# Patient Record
Sex: Male | Born: 1935 | Race: White | Hispanic: No | Marital: Married | Smoking: Never smoker
Health system: Southern US, Community
[De-identification: ages and names within clinical notes are randomized; demographics above are authoritative.]

## PROBLEM LIST (undated history)

## (undated) DIAGNOSIS — H3530 Unspecified macular degeneration: Secondary | ICD-10-CM

## (undated) DIAGNOSIS — Z8739 Personal history of other diseases of the musculoskeletal system and connective tissue: Secondary | ICD-10-CM

## (undated) DIAGNOSIS — E119 Type 2 diabetes mellitus without complications: Secondary | ICD-10-CM

## (undated) DIAGNOSIS — E785 Hyperlipidemia, unspecified: Secondary | ICD-10-CM

## (undated) DIAGNOSIS — I10 Essential (primary) hypertension: Secondary | ICD-10-CM

## (undated) DIAGNOSIS — H353210 Exudative age-related macular degeneration, right eye, stage unspecified: Secondary | ICD-10-CM

## (undated) DIAGNOSIS — I255 Ischemic cardiomyopathy: Secondary | ICD-10-CM

## (undated) DIAGNOSIS — N200 Calculus of kidney: Secondary | ICD-10-CM

## (undated) DIAGNOSIS — I214 Non-ST elevation (NSTEMI) myocardial infarction: Secondary | ICD-10-CM

## (undated) DIAGNOSIS — I639 Cerebral infarction, unspecified: Secondary | ICD-10-CM

## (undated) DIAGNOSIS — I2510 Atherosclerotic heart disease of native coronary artery without angina pectoris: Secondary | ICD-10-CM

## (undated) DIAGNOSIS — I8390 Asymptomatic varicose veins of unspecified lower extremity: Secondary | ICD-10-CM

## (undated) DIAGNOSIS — Z95810 Presence of automatic (implantable) cardiac defibrillator: Secondary | ICD-10-CM

## (undated) DIAGNOSIS — I5022 Chronic systolic (congestive) heart failure: Secondary | ICD-10-CM

## (undated) HISTORY — DX: Hyperlipidemia, unspecified: E78.5

## (undated) HISTORY — DX: Essential (primary) hypertension: I10

## (undated) HISTORY — DX: Asymptomatic varicose veins of unspecified lower extremity: I83.90

## (undated) HISTORY — PX: TONSILLECTOMY: SUR1361

## (undated) HISTORY — DX: Non-ST elevation (NSTEMI) myocardial infarction: I21.4

## (undated) HISTORY — PX: CYSTOSCOPY W/ LITHOLAPAXY / EHL: SUR377

## (undated) HISTORY — PX: CARDIAC CATHETERIZATION: SHX172

## (undated) HISTORY — PX: CATARACT EXTRACTION W/ INTRAOCULAR LENS  IMPLANT, BILATERAL: SHX1307

## (undated) HISTORY — DX: Cerebral infarction, unspecified: I63.9

## (undated) HISTORY — PX: CORONARY ANGIOPLASTY: SHX604

## (undated) HISTORY — DX: Atherosclerotic heart disease of native coronary artery without angina pectoris: I25.10

## (undated) HISTORY — DX: Type 2 diabetes mellitus without complications: E11.9

## (undated) HISTORY — DX: Unspecified macular degeneration: H35.30

---

## 2003-03-23 HISTORY — PX: CORONARY ARTERY BYPASS GRAFT: SHX141

## 2003-08-27 ENCOUNTER — Inpatient Hospital Stay (HOSPITAL_COMMUNITY): Admission: EM | Admit: 2003-08-27 | Discharge: 2003-09-04 | Payer: Self-pay | Admitting: Internal Medicine

## 2003-08-27 ENCOUNTER — Encounter: Payer: Self-pay | Admitting: Cardiology

## 2003-08-30 ENCOUNTER — Encounter: Payer: Self-pay | Admitting: Cardiology

## 2003-09-04 ENCOUNTER — Encounter: Payer: Self-pay | Admitting: Cardiology

## 2003-09-20 ENCOUNTER — Encounter: Admission: RE | Admit: 2003-09-20 | Discharge: 2003-09-20 | Payer: Self-pay | Admitting: Cardiothoracic Surgery

## 2003-11-19 ENCOUNTER — Encounter: Payer: Self-pay | Admitting: Cardiology

## 2004-02-12 ENCOUNTER — Ambulatory Visit: Payer: Self-pay | Admitting: Cardiology

## 2004-04-18 ENCOUNTER — Encounter: Payer: Self-pay | Admitting: Physician Assistant

## 2004-04-18 ENCOUNTER — Inpatient Hospital Stay (HOSPITAL_COMMUNITY): Admission: AD | Admit: 2004-04-18 | Discharge: 2004-04-21 | Payer: Self-pay | Admitting: Neurology

## 2004-04-20 ENCOUNTER — Ambulatory Visit: Payer: Self-pay | Admitting: Cardiology

## 2004-04-20 ENCOUNTER — Encounter: Payer: Self-pay | Admitting: Cardiology

## 2004-04-21 ENCOUNTER — Encounter: Payer: Self-pay | Admitting: Cardiology

## 2007-08-17 LAB — CONVERTED CEMR LAB: Hgb A1c MFr Bld: 7.4 %

## 2007-09-11 ENCOUNTER — Ambulatory Visit: Payer: Self-pay | Admitting: Internal Medicine

## 2007-09-11 DIAGNOSIS — E785 Hyperlipidemia, unspecified: Secondary | ICD-10-CM | POA: Insufficient documentation

## 2007-09-11 DIAGNOSIS — I10 Essential (primary) hypertension: Secondary | ICD-10-CM | POA: Insufficient documentation

## 2007-09-11 DIAGNOSIS — I2510 Atherosclerotic heart disease of native coronary artery without angina pectoris: Secondary | ICD-10-CM | POA: Insufficient documentation

## 2007-09-11 DIAGNOSIS — Z8679 Personal history of other diseases of the circulatory system: Secondary | ICD-10-CM | POA: Insufficient documentation

## 2007-09-11 DIAGNOSIS — E1165 Type 2 diabetes mellitus with hyperglycemia: Secondary | ICD-10-CM

## 2007-09-11 DIAGNOSIS — IMO0001 Reserved for inherently not codable concepts without codable children: Secondary | ICD-10-CM | POA: Insufficient documentation

## 2007-09-12 ENCOUNTER — Encounter (INDEPENDENT_AMBULATORY_CARE_PROVIDER_SITE_OTHER): Payer: Self-pay | Admitting: Internal Medicine

## 2007-09-13 LAB — CONVERTED CEMR LAB
ALT: 19 units/L (ref 0–53)
AST: 16 units/L (ref 0–37)
Albumin: 4.7 g/dL (ref 3.5–5.2)
Alkaline Phosphatase: 77 units/L (ref 39–117)
BUN: 20 mg/dL (ref 6–23)
Basophils Absolute: 0 10*3/uL (ref 0.0–0.1)
Basophils Relative: 0 % (ref 0–1)
CO2: 26 meq/L (ref 19–32)
Calcium: 9.4 mg/dL (ref 8.4–10.5)
Chloride: 109 meq/L (ref 96–112)
Cholesterol: 268 mg/dL — ABNORMAL HIGH (ref 0–200)
Creatinine, Ser: 1.07 mg/dL (ref 0.40–1.50)
Eosinophils Absolute: 0 10*3/uL (ref 0.0–0.7)
Eosinophils Relative: 0 % (ref 0–5)
Glucose, Bld: 164 mg/dL — ABNORMAL HIGH (ref 70–99)
HCT: 42.7 % (ref 39.0–52.0)
HDL: 42 mg/dL (ref >39)
Hemoglobin: 14.4 g/dL (ref 13.0–17.0)
LDL Cholesterol: 190 mg/dL — ABNORMAL HIGH (ref 0–99)
Lymphocytes Relative: 15 % (ref 12–46)
Lymphs Abs: 1.1 10*3/uL (ref 0.7–4.0)
MCHC: 33.7 g/dL (ref 30.0–36.0)
MCV: 91.6 fL (ref 78.0–100.0)
Monocytes Absolute: 0.5 10*3/uL (ref 0.1–1.0)
Monocytes Relative: 7 % (ref 3–12)
Neutro Abs: 5.5 10*3/uL (ref 1.7–7.7)
Neutrophils Relative %: 77 % (ref 43–77)
Platelets: 219 10*3/uL (ref 150–400)
Potassium: 4.1 meq/L (ref 3.5–5.3)
RBC: 4.66 M/uL (ref 4.22–5.81)
RDW: 13.7 % (ref 11.5–15.5)
Sodium: 147 meq/L — ABNORMAL HIGH (ref 135–145)
Total Bilirubin: 0.5 mg/dL (ref 0.3–1.2)
Total CHOL/HDL Ratio: 6.4
Total Protein: 6.9 g/dL (ref 6.0–8.3)
Triglycerides: 179 mg/dL — ABNORMAL HIGH (ref <150)
VLDL: 36 mg/dL (ref 0–40)
WBC: 7.1 10*3/uL (ref 4.0–10.5)

## 2007-10-04 ENCOUNTER — Encounter (INDEPENDENT_AMBULATORY_CARE_PROVIDER_SITE_OTHER): Payer: Self-pay | Admitting: Internal Medicine

## 2008-04-24 ENCOUNTER — Encounter (INDEPENDENT_AMBULATORY_CARE_PROVIDER_SITE_OTHER): Payer: Self-pay | Admitting: Internal Medicine

## 2008-08-01 ENCOUNTER — Encounter (INDEPENDENT_AMBULATORY_CARE_PROVIDER_SITE_OTHER): Payer: Self-pay | Admitting: Internal Medicine

## 2009-11-19 ENCOUNTER — Encounter: Payer: Self-pay | Admitting: Cardiology

## 2009-11-27 ENCOUNTER — Encounter: Payer: Self-pay | Admitting: Cardiology

## 2009-11-28 ENCOUNTER — Encounter: Payer: Self-pay | Admitting: Cardiology

## 2009-12-15 ENCOUNTER — Ambulatory Visit: Payer: Self-pay | Admitting: Cardiology

## 2009-12-15 ENCOUNTER — Encounter: Payer: Self-pay | Admitting: Physician Assistant

## 2009-12-15 DIAGNOSIS — I4510 Unspecified right bundle-branch block: Secondary | ICD-10-CM | POA: Insufficient documentation

## 2009-12-16 ENCOUNTER — Encounter: Payer: Self-pay | Admitting: Physician Assistant

## 2009-12-23 ENCOUNTER — Encounter (INDEPENDENT_AMBULATORY_CARE_PROVIDER_SITE_OTHER): Payer: Self-pay | Admitting: *Deleted

## 2009-12-23 ENCOUNTER — Ambulatory Visit (HOSPITAL_COMMUNITY): Admission: RE | Admit: 2009-12-23 | Discharge: 2009-12-23 | Payer: Self-pay | Admitting: Cardiology

## 2009-12-23 ENCOUNTER — Encounter: Payer: Self-pay | Admitting: Cardiology

## 2009-12-23 ENCOUNTER — Ambulatory Visit: Payer: Self-pay | Admitting: Cardiology

## 2009-12-23 ENCOUNTER — Ambulatory Visit: Payer: Self-pay

## 2009-12-24 ENCOUNTER — Encounter (INDEPENDENT_AMBULATORY_CARE_PROVIDER_SITE_OTHER): Payer: Self-pay | Admitting: *Deleted

## 2009-12-29 ENCOUNTER — Ambulatory Visit: Payer: Self-pay | Admitting: Cardiology

## 2009-12-29 ENCOUNTER — Encounter: Payer: Self-pay | Admitting: Cardiology

## 2010-01-01 ENCOUNTER — Telehealth (INDEPENDENT_AMBULATORY_CARE_PROVIDER_SITE_OTHER): Payer: Self-pay | Admitting: *Deleted

## 2010-01-12 ENCOUNTER — Ambulatory Visit: Payer: Self-pay | Admitting: Cardiology

## 2010-01-12 DIAGNOSIS — I255 Ischemic cardiomyopathy: Secondary | ICD-10-CM | POA: Insufficient documentation

## 2010-03-19 ENCOUNTER — Encounter: Payer: Self-pay | Admitting: Physician Assistant

## 2010-03-24 ENCOUNTER — Encounter: Payer: Self-pay | Admitting: Physician Assistant

## 2010-03-30 ENCOUNTER — Ambulatory Visit
Admission: RE | Admit: 2010-03-30 | Discharge: 2010-03-30 | Payer: Self-pay | Source: Home / Self Care | Attending: Cardiology | Admitting: Cardiology

## 2010-04-21 NOTE — Progress Notes (Signed)
Summary: PHONE: TEST RESULTS  Phone Note Call from Patient Call back at Home Phone [phone]   Caller: wife-JOY Reason for Call: Lab or Test Results Summary of Call: Had voice mail message from [name]. She would really like to receive a telephone call about test results for her husband Clayton Morton.  Initial call taken by: Clayton Morton,  January 01, 2010 4:04 PM  Follow-up for Phone Call        Spoke with pt. He states they just wanted to let us know that when he went for Echo they were not going to do definity until he reminded them to do it. He is aware results of stress test will be d/w him at office visit on 10/24. Follow-up by: Gurney Maxin, RN, BSN,  January 01, 2010 4:11 PM

## 2010-04-21 NOTE — Miscellaneous (Signed)
Summary: IV for definity echo  Clinical Lists Changes IV started in R wrist with a 20g insyte for definity with echo via [facility]

## 2010-04-21 NOTE — Assessment & Plan Note (Signed)
Summary: EST-LAST SEEN 2005   CARDIOMYAPATHY   Visit Type:  Initial Consult Primary Terri Rorrer:  Dr. Jerene Bears   History of Present Illness: 77 year old male referred for cardiology consultation. He is referred to discuss an abnormal echocardiogram, ordered by Dr. Woody Seller. This study is reviewed below (read by [employer]).  Patient explains that he underwent recent extensive workup, per Dr. Woody Seller, for evaluation of persistently elevated blood pressure. His workup included a CT scan of the head and carotid Dopplers, both reportedly negative. He states that he had to undergo redo surgery of his foot, and remained bed ridden for a few months, during which time he gained weight and noted a rise in his blood pressure. He also developed some associated headaches.  He has since been placed on an additional antihypertensive, and reports improved blood pressure readings, down from the initial [id number] systolic range, to current readings in the 130-140 range.  Clinically, he denies any interim development of exertional angina pectoris or dyspnea, nor orthopnea, PND, or significant lower extremity edema. He also denies any tachycardia palpitations.  Preventive Screening-Counseling & Management  Alcohol-Tobacco     Smoking Status: never  Current Medications (verified): 1)  Metformin Hcl 500 Mg  Tabs (Metformin Hcl) .... Take 1 Tablet By Mouth Once A Day 2)  Altace 5 Mg  Caps (Ramipril) .... Take 1 Tablet By Mouth Two Times A Day 3)  L-Carnitine 500 Mg  Tabs (Levocarnitine) .... Take 2 Tablet By Mouth Once A Day 4)  Aspirin 81 Mg  Tbec (Aspirin) .[name].. 1 By Mouth Once Daily 5)  Icaps Areds Formula  Tabs (Multiple Vitamins-Minerals) .... Take 1 Tablet By Mouth Once A Day 6)  Azor 5-40 Mg Tabs (Amlodipine-Olmesartan) .... Take 1 Tablet By Mouth Once A Day 7)  Allopurinol 300 Mg Tabs (Allopurinol) .... Take 1 Tablet By Mouth Once A Day 8)  Neuro-Plus  Caps (Nutritional Supplements) .... Take 2 Tablet By  Mouth Once A Day 9)  Lutein 40 Mg Caps (Lutein) .... Take 1 Tablet By Mouth Once A Day  Allergies (verified): 1)  ! Tetracycline  Comments:  Nurse/Medical Assistant: The patient's medication list and allergies were reviewed with the patient and were updated in the Medication and Allergy Lists.  Past History:  Family History: Last updated: 12/15/2009 Noncontributory  Social History: Last updated: 12/15/2009 Married Never Smoked Alcohol use-no Drug use-no  Past Medical History: Hypertension Cerebrovascular accident - right subcortical infarct 2006 Cataracts Macular degeneration - Dr. [name] CAD - multivessel Hyperlipidemia Varicose veins Diabetes Type 2 Myocardial Infarction - NSTEMI 2005  Past Surgical History: CABG 2005, Dr. Prescott Gum - LIMA to LAD, SVG to diagonal, SVG to PDA and PLA  Family History: Noncontributory  Social History: Married Never Smoked Alcohol use-no Drug use-no  Review of Systems       No fevers, chills, hemoptysis, dysphagia, melena, hematocheezia, hematuria, rash, claudication, orthopnea, pnd, pedal edema. All other systems negative.   Vital Signs:  Patient profile:   77 year old male Height:      65.25 inches Weight:      184 pounds BMI:     30.49 Pulse rate:   63 / minute BP sitting:   162 / 79  (left arm) Cuff size:   regular  Vitals Entered By: Georgina Peer (December 15, 2009 10:52 AM)  Nutrition Counseling: Patient's BMI is greater than 25 and therefore counseled on weight management options.  Physical Exam  Additional Exam:  GEN: 77 year old male, sitting upright, in  no distress HEENT: NCAT,PERRLA,EOMI NECK: palpable pulses, no bruits; no JVD; no TM LUNGS: CTA bilaterally HEART: RRR (S1S2); no significant murmurs; no rubs; no gallops ABD: soft, NT; intact BS EXT: intact distal pulses; 1+ pretibial edema SKIN: warm, dry MUSC: no obvious deformity NEURO: A/O (x3)     Echocardiogram  Procedure date:   11/19/2009  Findings:      Moderately dilated LV with severe inferoposterior hypokinesis to akinesis and mild lateral hypokinesis. Apex not well visualized, consider apical artifact rule out mass or thrombus. Mild LVH. Moderate LAE. Mild mitral regurgitation with moderate aortic valve sclerosis. Mild aortic and tricuspid regurgitation. RVSP 35-40 mm mercury. LVEF estimated at 40-45%. No pericardial effusion.  Cardiac Cath  Procedure date:  08/27/2003  Findings:       FINDINGS:  1. LV:  148/10/14.  EF 27% with akinesis of the mid to distal anterior wall     and apex.  2. No aortic stenosis or mitral regurgitation.  3. Left main:  Ostial 20% stenosis.  4. LAD:  Large vessel giving rise to three diagonal branches.  The proximal     LAD has a 90% stenosis and then is occluded after the takeoff of a     moderate-size first diagonal.  This diagonal has a 95% ostial stenosis.     The mid and distal LAD are collateralized from the RCA.  5. Circumflex:  The circumflex is occluded proximally.  There is dye     staining consistent with thrombus.  6. RCA:  Very large, dominant vessel.  There is a very large posterolateral     branch supplying much of the posterior wall.  There is a 70% stenosis of     the distal RCA just before the takeoff of the PDA and PLV.  The first     posterolateral branch has a 95% ostial stenosis.  The larger second     posterolateral branch has a 99% stenosis.  There is TIMI-2 flow distal to     this.  Cardiac Cath  Procedure date:  12/15/2009  Findings:      normal sinus rhythm at 65 bpm; LAD/LA HB; right bundle branch block; T wave inversion in the precordial and high lateral leads. No previous study for comparison.  Impression & Recommendations:  Problem # 1:  CARDIOMYOPATHY (ICD-425.4)  Recent 2-D echo suggested mild LVD (EF 40-45%), and question of apical mass versus thrombus. Will further evaluate with a repeat 2-D echo with Definity contrast, to be  performed in our [place] office, for definitive exclusion of possible mass/thrombus, as well as reassessment of LV function. Will then have patient return to our clinic for early followup in one month, for review of the study results and further recommendations. Of note, he has not had an ischemic workup since undergoing multivessel bypass surgery in 2005, and will need a nuclear perfusion imaging study, in the near future.  Problem # 2:  HYPERLIPIDEMIA (B2193296.4)  Will reassess lipid status with a fasting lipid/liver profile. Patient reports having been significantly intolerant of Lipitor, in the past.  Problem # 3:  HYPERTENSION (ICD-401.9) Assessment: Comment Only  Improved, per patient's report. Continued monitoring and management, per Dr. Woody Seller.  Problem # 4:  RBBB (ICD-426.4)  Will request most recent EKG for comparison.  Other Orders: EKG w/ Interpretation (93000) T-Lipid Profile [id number]) T-Hepatic Function [phone]) 2-D Echocardiogram (2D Echo)  Patient Instructions: 1)  Follow up with [name] on Monday, January 12, 2010 at 9:40am. 2)  Your physician has requested that you have an echocardiogram.  Echocardiography is a painless test that uses sound waves to create images of your heart. It provides your doctor with information about the size and shape of your heart and how well your heart's chambers and valves are working.  This procedure takes approximately one hour. There are no restrictions for this procedure. This should be done with Definity contrast in [place]. 3)  Your physician recommends that you go to the [facility] for a FASTING lipid profile and liver function labs:  Do not eat or drink after midnight.   Appended Document: EST-LAST SEEN 2005   CARDIOMYAPATHY Data reviewed, patient seen and examined with Mr. Jacqualine Mau. Blood pressure control has been somewhat difficult lately, although the patient reports no clinical deterioration. Plan at this point is  to proceed with a Definity contrast echocardiogram to better assess the LV apex for thrombus. If reassuring, we will continue medical therapy, with an eye toward followup Cardiolite for ischemic surveillance, and also further review of llipid status.

## 2010-04-21 NOTE — Letter (Signed)
Summary: Internal Other/ PATIENT HISTORY FORM  Internal Other/ PATIENT HISTORY FORM   Imported By: Bartholomew Boards 12/18/2009 10:59:58  _____________________________________________________________________  External Attachment:    Type:   Image     Comment:   External Document

## 2010-04-21 NOTE — Letter (Signed)
Summary: Outpatient Coinsurance Notice  Outpatient Coinsurance Notice   Imported By: Marilynne Drivers 12/31/2009 12:03:32  _____________________________________________________________________  External Attachment:    Type:   Image     Comment:   External Document

## 2010-04-21 NOTE — Medication Information (Signed)
Summary: RX Folder/ MEDICATIONS [facility]  RX Folder/ MEDICATIONS [facility]   Imported By: Bartholomew Boards 12/02/2009 16:01:49  _____________________________________________________________________  External Attachment:    Type:   Image     Comment:   External Document

## 2010-04-21 NOTE — Op Note (Signed)
Summary: Operative Report  Operative Report   Imported By: Bartholomew Boards 12/02/2009 16:19:10  _____________________________________________________________________  External Attachment:    Type:   Image     Comment:   External Document

## 2010-04-21 NOTE — Letter (Signed)
Summary: Discharge Summary  Discharge Summary   Imported By: Bartholomew Boards 12/02/2009 16:17:37  _____________________________________________________________________  External Attachment:    Type:   Image     Comment:   External Document

## 2010-04-21 NOTE — Assessment & Plan Note (Signed)
Summary: 1 MO F/U PER 9/26 OV W/GENE-JM   Visit Type:  Follow-up Primary Provider:  Dr. Jerene Bears   History of Present Illness: 77 year old male presents for followup. He was seen back in September after a long hiatus. Followup cardiac testing was arranged, outlined below. He is here with his wife.  We reviewed his testing. He did not have evidence of a left ventricular mural thrombus by Definity contrast echo. LVEF is reduced in the range of 35%, with evidence of scar and no frank ischemia. In light of this, we discussed pursuing medical therapy, consideration for prophylactic defibrillator placement, and a basic walking regimen.  Followup labs from 27 September showed AST 17, ALT 22, cholesterol 272, triglycerides 160, HDL 42, LDL 198. He was started on Crestor and omega-3 supplements. Followup fasting lipid profile and liver function tests for 3 months.  Preventive Screening-Counseling & Management  Alcohol-Tobacco     Smoking Status: never  Current Medications (verified): 1)  Metformin Hcl 500 Mg  Tabs (Metformin Hcl) .... Take 1 Tablet By Mouth Once A Day 2)  Altace 5 Mg  Caps (Ramipril) .... Take 1 Tablet By Mouth Two Times A Day 3)  L-Carnitine 500 Mg  Tabs (Levocarnitine) .... Take 2 Tablet By Mouth Once A Day 4)  Aspirin 81 Mg  Tbec (Aspirin) .[name].. 1 By Mouth Once Daily 5)  Icaps Areds Formula  Tabs (Multiple Vitamins-Minerals) .... Take 1 Tablet By Mouth Once A Day 6)  Azor 5-40 Mg Tabs (Amlodipine-Olmesartan) .... Take 1 Tablet By Mouth Once A Day 7)  Allopurinol 300 Mg Tabs (Allopurinol) .... Take 1 Tablet By Mouth Once A Day 8)  Neuro-Plus  Caps (Nutritional Supplements) .... Take 2 Tablet By Mouth Once A Day 9)  Lutein 40 Mg Caps (Lutein) .... Take 1 Tablet By Mouth Once A Day 10)  Fish Oil 1000 Mg Caps (Omega-3 Fatty Acids) .... Take 1 Capsule By Mouth Two Times A Day 11)  Crestor 20 Mg Tabs (Rosuvastatin Calcium) .... Take One Tablet By Mouth Daily.  Allergies  (verified): 1)  ! Tetracycline  Comments:  Nurse/Medical Assistant: The patient's medication list and allergies were reviewed with the patient and were updated in the Medication and Allergy Lists.  Past History:  Past Medical History: Last updated: 12/15/2009 Hypertension Cerebrovascular accident - right subcortical infarct 2006 Cataracts Macular degeneration - Dr. [name] CAD - multivessel Hyperlipidemia Varicose veins Diabetes Type 2 Myocardial Infarction - NSTEMI 2005  Social History: Last updated: 12/15/2009 Married Never Smoked Alcohol use-no Drug use-no  Past Surgical History: CABG 2005 - LIMA to LAD, SVG to diagonal, SVG to PDA and PLA  Review of Systems  The patient denies anorexia, fever, chest pain, syncope, dyspnea on exertion, peripheral edema, melena, and hematochezia.         Otherwise reviewed and negative.  Vital Signs:  Patient profile:   77 year old male Height:      65.25 inches Weight:      184 pounds Pulse rate:   54 / minute BP sitting:   145 / 61  (left arm) Cuff size:   regular  Vitals Entered By: Georgina Peer (January 12, 2010 9:53 AM)   Physical Exam  Additional Exam:  GEN: 77 year old male, sitting upright, in no distress HEENT: NCAT,PERRLA,EOMI NECK: palpable pulses, no bruits; no JVD; no TM LUNGS: CTA bilaterally HEART: RRR (S1S2); no significant murmurs; no rubs; no gallops ABD: soft, NT; intact BS EXT: intact distal pulses; 1+ pretibial edema SKIN: warm,  dry MUSC: no obvious deformity NEURO: A/O (x3)     Nuclear Study  Procedure date:  12/29/2009  Findings:      Lexiscan Cardiolite showing no diagnostic ST segment changes, large fixed lateral defect consistent with scar, no active ischemia, LVEF 29%.  Echocardiogram  Procedure date:  12/23/2009  Findings:      Mildly dilated LV with mild LVH, LVEF 35% with inferior and anterolateral hypokinesis, no description of left ventricular thrombus with Definity, grade  1 diastolic dysfunction, moderately calcified aortic valve without stenosis, trivial AR, trivial MR, mild to moderate LAE, normal RV function.  Impression & Recommendations:  Problem # 1:  ISCHEMIC CARDIOMYOPATHY (ICD-414.8)  Symptomatically stable, LVEF approximately 35%. Followup testing did not reveal any evidence of a left ventricular mural thrombus. Cardiolite shows evidence of scar from prior infarct with no active ischemia. Plan is to continue medical therapy, begin a basic exercise regimen, and consider referral to electrophysiology for discussions regarding prophylactic defibrillator placement. He would like to think about this further prior to proceeding. Otherwise I will see him back in 3 months.  His updated medication list for this problem includes:    Altace 5 Mg Caps (Ramipril) .[name]... Take 1 tablet by mouth two times a day    Aspirin 81 Mg Tbec (Aspirin) .[name]... 1 by mouth once daily    Azor 5-40 Mg Tabs (Amlodipine-olmesartan) .[name]... Take 1 tablet by mouth once a day  Problem # 2:  HYPERLIPIDEMIA (ICD-272.4)  Continue present regimen with followup fasting lipid profile and liver function tests in the next 3 months.  His updated medication list for this problem includes:    Crestor 20 Mg Tabs (Rosuvastatin calcium) .[name]... Take one tablet by mouth daily.  Patient Instructions: 1)  Your physician wants you to follow-up in: 3 months. You will receive a reminder letter in the mail one-two months in advance. If you don't receive a letter, please call our office to schedule the follow-up appointment. 2)  Your physician recommends that you continue on your current medications as directed. Please refer to the Current Medication list given to you today.

## 2010-04-21 NOTE — Letter (Signed)
Summary: Lexiscan or Dobutamine Adult nurse at [place] [facility]. [address]   [place], [place] [address]   Phone: [phone]  Fax: [phone]      [place] or Dobutamine Cardiolite Strss Test    [facility]  Appointment Date:_  Appointment Time:_  Your doctor has ordered a CARDIOLITE STRESS TEST using a medication to stimulate exercise so that you will not have to walk on the treadmill to determine the condition of your heart during stress. If you take blood pressure medication, ask your doctor if you should take it the day of your test. You should not have anything to eat or drink at least 4 hours before your test is scheduled, and no caffeine, including decaffeinated tea and coffee, chocolate, and soft drinks for 24 hours before your test.  You will need to register at the Outpatient/Main Entrance at the hospital 15 minutes before your appointment time. It is a good idea to bring a copy of your order with you. They will direct you to the Diagnostic Imaging (Radiology) Department.  You will be asked to undress from the waist up and given a hospital gown to wear, so dress comfortably from the waist down for example: Sweat pants, shorts, or skirt Rubber soled lace up shoes (tennis shoes)  Plan on about three hours from registration to release from the hospital  Hold your diabetic medication the morning of your test, if you do not eat breakfast. You may take this when you eat next. You may take all of your remaining medications with a sip of water.  If the results of your test are normal or stable, you will receive a letter. If they are abnormal, the nurse will contact you by phone.

## 2010-04-21 NOTE — Letter (Signed)
Summary: Discharge Summary  Discharge Summary   Imported By: Bartholomew Boards 12/02/2009 16:23:00  _____________________________________________________________________  External Attachment:    Type:   Image     Comment:   External Document

## 2010-04-21 NOTE — Letter (Signed)
Summary: External Correspondence/ OFFICE VISIT [facility]  External Correspondence/ OFFICE VISIT [facility]   Imported By: Bartholomew Boards 12/02/2009 16:05:29  _____________________________________________________________________  External Attachment:    Type:   Image     Comment:   External Document

## 2010-04-21 NOTE — Cardiovascular Report (Signed)
Summary: Cardiac Catheterization  Cardiac Catheterization   Imported By: Bartholomew Boards 12/02/2009 16:15:20  _____________________________________________________________________  External Attachment:    Type:   Image     Comment:   External Document

## 2010-04-23 NOTE — Miscellaneous (Signed)
Summary: Orders Update  Clinical Lists Changes  Orders: Added new Test order of T-Lipid Profile ([id number]) - Signed Added new Test order of T-Hepatic Function ([id number]) - Signed 

## 2010-04-23 NOTE — Assessment & Plan Note (Signed)
Summary: [name]   Visit Type:  Follow-up Primary Provider:  Dr. Jerene Morton   History of Present Illness: 77 year old male presents for followup. He was seen in October 2011. We reviewed his cardiac testing at that time, discussed medical therapy, diet and exercise, and also the potential for electrophysiology referral. He did not want to pursue any type of device therapy at that point.  Since then he states he has been trying to be more active at home, using a stationary bicycle 3 days a week. He reports better energy, NYHA class II dyspnea on exertion. No anginal chest pain, palpitations, or syncope.  Followup labs from 3 January showed AST 17, ALT 25, cholesterol 166, triglycerides 115, HDL 57, LDL 86. We discussed these.  We discussed continue diet and exercise, with plan for followup echocardiogram prior to his next visit. We will continue to discuss electrophysiology referral along the way.  Preventive Screening-Counseling & Management  Alcohol-Tobacco     Smoking Status: never  Current Medications (verified): 1)  Metformin Hcl 500 Mg  Tabs (Metformin Hcl) .... Take 1 Tablet By Mouth Once A Day 2)  Altace 5 Mg  Caps (Ramipril) .... Take 1 Tablet By Mouth Two Times A Day 3)  L-Carnitine 500 Mg  Tabs (Levocarnitine) .... Take 2 Tablet By Mouth Once A Day 4)  Aspirin 81 Mg  Tbec (Aspirin) .[name].. 1 By Mouth Once Daily 5)  Icaps Areds Formula  Tabs (Multiple Vitamins-Minerals) .... Take 1 Tablet By Mouth Once A Day 6)  Azor 5-40 Mg Tabs (Amlodipine-Olmesartan) .... Take 1 Tablet By Mouth Once A Day 7)  Allopurinol 300 Mg Tabs (Allopurinol) .... Take 1 Tablet By Mouth Once A Day 8)  Neuro-Plus  Caps (Nutritional Supplements) .... Take 2 Tablet By Mouth Once A Day 9)  Lutein 40 Mg Caps (Lutein) .... Take 1 Tablet By Mouth Once A Day 10)  Fish Oil 1000 Mg Caps (Omega-3 Fatty Acids) .... Take 1 Capsule By Mouth Two Times A Day 11)  Crestor 20 Mg Tabs (Rosuvastatin Calcium) .... Take One  Tablet By Mouth Daily.  Allergies (verified): 1)  ! Tetracycline  Comments:  Nurse/Medical Assistant: The patient's medications and allergies were reviewed with the patient and were updated in the Medication and Allergy Lists. Reviewed list w/ pt. Clayton Morton CMA (March 30, 2010 10:20 AM)  Past History:  Past Medical History: Last updated: 12/15/2009 Hypertension Cerebrovascular accident - right subcortical infarct 2006 Cataracts Macular degeneration - Clayton Morton CAD - multivessel Hyperlipidemia Varicose veins Diabetes Type 2 Myocardial Infarction - NSTEMI 2005  Past Surgical History: Last updated: 01/12/2010 CABG 2005 - LIMA to LAD, SVG to diagonal, SVG to PDA and PLA  Social History: Last updated: 12/15/2009 Married Never Smoked Alcohol use-no Drug use-no  Review of Systems       The patient complains of dyspnea on exertion.  The patient denies anorexia, fever, chest pain, syncope, peripheral edema, prolonged cough, abdominal pain, melena, and hematochezia.         Stable appetite. Otherwise reviewed and negative.  Vital Signs:  Patient profile:   77 year old male Height:      65.25 inches Weight:      183 pounds BMI:     30.33 Pulse rate:   65 / minute BP sitting:   142 / 77  (left arm) Cuff size:   regular  Vitals Entered By: Annett Gula CMA (March 30, 2010 10:20 AM)  Physical Exam  Additional Exam:  GEN: 77 year old male, sitting upright, in no distress HEENT: NCAT,PERRLA,EOMI NECK: palpable pulses, no bruits; no JVD; no TM LUNGS: CTA bilaterally HEART: RRR (S1S2); no significant murmurs; no rubs; no gallops ABD: soft, NT; intact BS EXT: intact distal pulses; 1+ pretibial edema, venous stasis. SKIN: warm, dry MUSC: no obvious deformity NEURO: A/O (x3)     Prior Report Reviewed for Echocardiogram:  Findings: 12/23/2009 Mildly dilated LV with mild LVH, LVEF 35% with inferior and anterolateral hypokinesis, no description of left  ventricular thrombus with Definity, grade 1 diastolic dysfunction, moderately calcified aortic valve without stenosis, trivial AR, trivial MR, mild to moderate LAE, normal RV function.  Comments:    Prior Report Reviewed for Nuclear Study:  Findings: 12/29/2009 Lexiscan Cardiolite showing no diagnostic ST segment changes, large fixed lateral defect consistent with scar, no active ischemia, LVEF 29%.  Impression & Recommendations:  Problem # 1:  ISCHEMIC CARDIOMYOPATHY (ICD-414.8)  Patient reports better energy, less shortness of breath in general. Reports compliance with medications, has been following his diet with increased exercise. Continues to prefer no referral for electrophysiology consultation at this point. We plan to reassess LVEF prior to his next visit in 3 months.  His updated medication list for this problem includes:    Altace 5 Mg Caps (Ramipril) .[name]... Take 1 tablet by mouth two times a day    Aspirin 81 Mg Tbec (Aspirin) .[name]... 1 by mouth once daily    Azor 5-40 Mg Tabs (Amlodipine-olmesartan) .[name]... Take 1 tablet by mouth once a day  Problem # 2:  HYPERLIPIDEMIA (ICD-272.4)  Lipids coming under better control. He is tolerating Crestor.  His updated medication list for this problem includes:    Crestor 20 Mg Tabs (Rosuvastatin calcium) .[name]... Take one tablet by mouth daily.  Problem # 3:  HYPERTENSION (ICD-401.9)  Blood pressure up some today. Discussed sodium restriction, regular exercise. His updated medication list for this problem includes:    Altace 5 Mg Caps (Ramipril) .[name]... Take 1 tablet by mouth two times a day    Aspirin 81 Mg Tbec (Aspirin) .[name]... 1 by mouth once daily    Azor 5-40 Mg Tabs (Amlodipine-olmesartan) .[name]... Take 1 tablet by mouth once a day  Problem # 4:  DIABETES MELLITUS, TYPE II, UNCONTROLLED (ICD-250.02)  Continue regular followup with Clayton Morton.  His updated medication list for this problem includes:    Metformin Hcl 500 Mg Tabs  (Metformin hcl) .[name]... Take 1 tablet by mouth once a day    Altace 5 Mg Caps (Ramipril) .[name]... Take 1 tablet by mouth two times a day    Aspirin 81 Mg Tbec (Aspirin) .[name]... 1 by mouth once daily    Azor 5-40 Mg Tabs (Amlodipine-olmesartan) .[name]... Take 1 tablet by mouth once a day   Patient Instructions: 1)  2D Echo just before next visit 2)  Follow up in  3 months

## 2010-07-13 ENCOUNTER — Other Ambulatory Visit: Payer: Self-pay | Admitting: Neurosurgery

## 2010-07-13 DIAGNOSIS — M5126 Other intervertebral disc displacement, lumbar region: Secondary | ICD-10-CM

## 2010-07-14 ENCOUNTER — Ambulatory Visit
Admission: RE | Admit: 2010-07-14 | Discharge: 2010-07-14 | Disposition: A | Discharge status: Home / Self Care | Payer: Medicare Other | Source: Ambulatory Visit | Attending: Neurosurgery | Admitting: Neurosurgery

## 2010-07-14 DIAGNOSIS — M5126 Other intervertebral disc displacement, lumbar region: Secondary | ICD-10-CM

## 2010-08-07 NOTE — Discharge Summary (Signed)
Clayton Morton, Clayton Morton                           ACCOUNT NO.:  [id number]   MEDICAL RECORD NO.:  [id number]                   PATIENT TYPE:  INP   LOCATION:  2027                                 FACILITY:  [facility]   PHYSICIAN:  Ivin Poot, M.D.               DATE OF BIRTH:  [birth year]   DATE OF ADMISSION:  08/27/2003  DATE OF DISCHARGE:  09/04/2003                                 DISCHARGE SUMMARY   PRIMARY ADMITTING DIAGNOSIS:  Chest pain.   ADDITIONAL/DISCHARGE DIAGNOSES:  1. Non-Q wave acute myocardial infarction.  2. Class IV unstable angina.  3. Severe 3-vessel coronary artery disease.  4. Hypertension.  5. Hyperlipidemia.  6. Type 2 diabetes mellitus.   PROCEDURES PERFORMED:  1. Cardiac catheterization.  2. Placement of intra-aortic balloon pump.  3. Coronary artery bypass grafting x4 (left internal mammary artery to the     left anterior descending, saphenous vein graft to the diagonal,     sequential saphenous vein graft to the posterior descending and     posterolateral branches of the right coronary).  4. Endoscopic vein harvest left leg.   HISTORY:  The patient is a 77 year old white male with history of  hypertension and diabetes.  He has no previous cardiac history to speak of.  On the date of this admission, he developed chest tightness and pressure,  which was associated with some discomfort in his neck as well as some  nausea, diaphoresis, and shortness of breath.  Apparently the patient had  had transient symptoms, which were similar to his presenting symptoms on the  day prior to admission but did resolved without difficulty.  Because of his  symptoms, he presented to the emergency department at [facility] for  further evaluation.  His EKG showed evidence of acute non-Q wave myocardial  infarction with elevated cardiac enzymes.  He was subsequently transferred  to [facility] for further cardiac workup.   HOSPITAL COURSE:  He was admitted to  [facility] on transfer on August 27, 2003.  He was started on IV heparin, Integrilin and nitroglycerin.  He  continued to have chest pain despite these measures and was referred for  urgent cardiac catheterization.  This was performed by Dr. Albertine Patricia.  Patient  was found to have severe 3-vessel coronary artery disease including 90%  proximal LAD stenosis with occlusion of the LAD after the takeoff of a  moderate-size first diagonal.  The diagonal had 95% ostial stenosis.  There  was occlusion of the circumflex proximally.  There was a 70% stenosis of the  distal RCA just before the takeoff of the PDA and the PL.  First  posterolateral branch has a 95% ostial stenosis and second posterolateral  branch had a 99% stenosis.  His ejection fraction was 27% with akinesis of  the mid to distal anterior wall and apex.  During the cardiac  catheterization  his pain was relieved.  Because his pain had resolved,  decision was made to place an intra-aortic balloon pump and to consult CVTS  for consideration of surgical revascularization.  Dr. Prescott Gum saw the  patient in consult and agreed that surgical revascularization was best  option.  He remained stable and pain-free on the balloon pump and was  scheduled for CABG on August 29, 2003.  He remained stable until the time of  surgery.  He was taken to the operating room on August 29, 2003, and underwent  CABG x4 as described in detail above performed by Dr. Tharon Aquas Trigt.  He  tolerated the procedure well.  Intraoperatively the circumflex was noted to  be too small to graft.  He did receive 2 units of packed red blood cells in  the operating room for hematocrit of 27.  He otherwise did well and was  transferred to the SICU in stable condition.  He was able to be extubated  shortly after surgery.  He was hemodynamically stable and doing well on  postop day one.  At that time, the balloon pump was removed.  He was started  on beta-blocker and diuretic,  and was able to be mobilized.  He remained in  the unit for overnight observation.  He initially was maintained on a low-  dose dopamine drip and this was weaned and discontinued late in the day on  postop day one.  He was started on ACE inhibitor for previous myocardial  infarction.  By postop day three he was ready for transfer to the floor.  Overall he has done very well postoperatively.  He has been ambulating in  the halls without difficulty.  He has been weaned from supplemental oxygen  and is maintaining O2 saturations of greater than 90% on room air.  His only  postoperative difficulties have been hypertension with systolic blood  pressures being elevated in the low 140 to 170 range.  Because of this his  beta-blocker dose as well as his ACE inhibitor dose have all been titrated  upward and at present time his systolic blood pressures have come down to  the 120s.  His heart rate has remained stable on doses of these medications  and he has maintained normal sinus rhythm.  His blood sugars have also been  suboptimally controlled.  He was maintained on Glucommander protocol  perioperatively and once this was discontinued he was re-started on his home  dose of DiaBeta as well as Lantus plus sliding scale insulin.  Despite these  measures, his blood sugars have been elevated and his Lantus dose has been  titrated upward.  Today his blood sugars are running in the 140 to 150  range, and appear to be somewhat better controlled.  His hemoglobin-A1c was  7.2 on admission.  He has also had some mild abdominal distention and  discomfort.  His tolerating his regular diet without difficulty and has had  resumption of his bowel function.  His abdominal distention has improved and  at present he is comfortable.   His most recent labs show a potassium of 4.2, BUN 18, creatinine 1.0.  Hemoglobin 11.8, hematocrit 34.7, platelets 179, white count 7.8.  His most recent chest x-ray showed improving  bibasilar atelectasis.  It is felt if he  continues to remain stable over the next 24 hours and no changes occur, he  will hopefully be ready for discharge home on September 04, 2003.   DISCHARGE MEDICATIONS:  1.  Enteric-coated aspirin 325 mg daily.  2. Lopressor 50 mg b.i.d.  3. Altace 5 mg daily.  4. Lipitor 80 mg q.h.s.  5. DiaBeta 5 mg daily.  6. Lasix 40 mg daily x1 week.  7. K-Dur 20 mEq daily x1 week.  8. Lantus 34 units q.h.s.  9. Tylox one to two q.4 hours p.r.n. for pain.   DISCHARGE INSTRUCTIONS:  He is to refrain from driving, heavy lifting or  strenuous activity.  He may continue ambulating daily and using his  incentive spirometer.  He may shower daily and clean his incisions with soap  and water.  He is asked to continue a low-fat, low-sodium, carbohydrate  modified, medium calorie diet.   DISCHARGE FOLLOW UP:  He is asked to track his blood sugars over the next  week and to follow up with Dr. Woody Seller for re-evaluation of his diabetes  medications.  He will follow up with [name] in the [place] office in  [place] in 2 weeks.  He will be contacted by their office regarding this  appointment and will have a chest x-ray at that  visit.  He will then see Dr. Ivin Poot on September 20, 2003, at 11:45 a.m.  He is asked to bring his chest x-ray to this visit for Dr. Prescott Gum to  review.  In the interim, if he experiences any problems or has questions, he  is asked to contact to our office immediately.      Suzzanne Cloud, P.A.                        Ivin Poot, M.D.    GC/MEDQ  D:  09/03/2003  T:  09/03/2003  Job:  [id number]   cc:   Satira Sark, M.D. [facility]   [address]  [place]  [place] [address]  Fax: [phone]

## 2010-08-07 NOTE — Cardiovascular Report (Signed)
NAME:  MALIN, EICKHOFF                           ACCOUNT NO.:  [id number]   MEDICAL RECORD NO.:  [id number]                   PATIENT TYPE:  INP   LOCATION:  NA                                   FACILITY:  [facility]   PHYSICIAN:  Ethelle Lyon, M.D. [facility]         DATE OF BIRTH:  [birth year]   DATE OF PROCEDURE:  08/27/2003  DATE OF DISCHARGE:                              CARDIAC CATHETERIZATION   PROCEDURE:  1. Left heart catheterization.  2. Left ventriculography.  3. Coronary angiography.  4. Placement of intra-aortic balloon pump.   INDICATION:  Mr. Cancilla is a 77 year old gentleman with no prior history of  cardiovascular disease.  He presents with waxing and waning chest discomfort  for a day, becoming constant late this afternoon.  Electrocardiogram  demonstrated diffuse ST depressions and cardiac enzymes at [facility]  were positive.  He had ongoing chest discomfort and was transferred for  urgent cardiac catheterization.  Upon arrival to [facility], he had 1/10  ongoing chest discomfort.  With pain not responsive to nitroglycerin,  heparin, and eptifibatide, a decision was made to proceed to a cardiac  catheterization for revascularization.   PROCEDURAL TECHNIQUE:  Informed consent was obtained.  Under 1% lidocaine  local anesthesia, a 6-French sheath was placed in the right common femoral  artery using the modified Seldinger technique.  Diagnostic angiography and  ventriculography were performed using JL-4, JR-4, and pigtail catheters.  The pigtail catheter was then pulled back to the suprarenal abdominal aorta.  Abdominal aortography was performed by power injection.  Next, the right  common femoral arterial sheath was removed over a wire and exchanged for a  40-cm intra-aortic balloon pump.  Counter pulsation was initiated at one-to-  one.  The patient tolerated the procedure well and was transferred to the  surgical intensive care unit in stable condition.   COMPLICATIONS:  None.   FINDINGS:  1. LV:  148/10/14.  EF 27% with akinesis of the mid to distal anterior wall     and apex.  2. No aortic stenosis or mitral regurgitation.  3. Left main:  Ostial 20% stenosis.  4. LAD:  Large vessel giving rise to three diagonal branches.  The proximal     LAD has a 90% stenosis and then is occluded after the takeoff of a     moderate-size first diagonal.  This diagonal has a 95% ostial stenosis.     The mid and distal LAD are collateralized from the RCA.  5. Circumflex:  The circumflex is occluded proximally.  There is dye     staining consistent with thrombus.  6. RCA:  Very large, dominant vessel.  There is a very large posterolateral     branch supplying much of the posterior wall.  There is a 70% stenosis of     the distal RCA just before the takeoff of the PDA and PLV.  The first  posterolateral branch has a 95% ostial stenosis.  The larger second     posterolateral branch has a 99% stenosis.  There is TIMI-2 flow distal to     this.   IMPRESSION/PLAN:  During the procedure, the patient became free of any chest  discomfort.  With him pain-free a minimum of seven hours into his infarction  with severe multivessel disease and infarction apparently due to a  relatively small circumflex territory, a decision was made to place an intra-  aortic balloon pump and refer to coronary artery bypass grafting.                                               Ethelle Lyon, M.D. [facility]    WED/MEDQ  D:  08/28/2003  T:  08/29/2003  Job:  [id number]   cc:   Jerene Bears  [place]  [place] [address]  Fax: [phone]   Deboraha Sprang, M.D.

## 2010-08-07 NOTE — Op Note (Signed)
NAME:  CONG, MATHESON                           ACCOUNT NO.:  [id number]   MEDICAL RECORD NO.:  [id number]                   PATIENT TYPE:  INP   LOCATION:  2310                                 FACILITY:  [facility]   PHYSICIAN:  Ivin Poot III, M.D.           DATE OF BIRTH:  17-Aug-1932   DATE OF PROCEDURE:  DATE OF DISCHARGE:                                 OPERATIVE REPORT   OPERATION:  Coronary artery bypass grafting times four (left internal  mammary artery to left anterior descending, saphenous vein graft to  diagonal, sequential saphenous vein graft to posterior descending and  posterolateral branch of the right coronary).   PREOPERATIVE DIAGNOSIS:  Class 4 unstable angina with severe three-vessel  coronary artery disease, reduced left ventricular function, and acute  subendocardial myocardial infarction.   POSTOPERATIVE DIAGNOSIS:  Class 4 unstable angina with severe three-vessel  coronary artery disease, reduced left ventricular function, and acute  subendocardial myocardial infarction.   SURGEON:  Ivin Poot, M.D.   ASSISTANT:  Suzzanne Cloud, P.A.   ANESTHESIA:  General by Dr. Andreas Blower.   INDICATIONS:  The patient is a 77 year old white male who presented to the  hospital with chest pain and positive enzymes.  Cardiac catheterization  demonstrated chronic occlusion of the left anterior descending, acute  occlusion of the non-dominant circumflex, and high-grade stenosis of the  proximal large dominant right coronary with chronically occluded distal  posterolateral branch of the right coronary.  His ejection fraction was  about 25-30%.  He was felt to be a candidate for surgical revascularization.  A balloon pump was placed in the catheterization lab because of his severe  coronary disease and ongoing chest pain.  I examined the patient in the ICU  prior to the operation and reviewed the results of the cardiac  catheterization with the patient and his family.  I  discussed the  indications and expected benefits of coronary bypass surgery for treatment  of his coronary artery disease.  I discussed the alternatives to surgical  therapy for treatment of his coronary disease and the expected outcome of  those alternatives therapies.  I discussed with the patient and family the  major aspects of the planned procedure including the choice of conduit for  grafts, the location of the surgical incisions, the use of general  anesthesia and cardiopulmonary bypass and the expected postoperative  hospital recovery.  I discussed with the patient the risks to him of  coronary bypass surgery including the risk of MI, CVA, bleeding, blood  transfusion requirement, infection, and death.  He understood these  indications for the surgery and agreed to proceed with the operation as  planned under what I felt was an informed consent.   FINDINGS:  The patient's heart was dilated.  The coronaries were suboptimal  targets and would not be adequate vessels for re-do grafting.  The left  anterior descending was deeply  intramyocardial and was grafted at the distal  aspect there was a small vessel.  The vein was harvested from the left leg  because the wound pump was placed in the right groin in the catheterization  lab.  The mammary artery was a good vessel, excellent flow.  The patient  received two units of blood in the operating room for a starting hematocrit  of 27%.  The patient was given aprotinin protocol due to use of  thrombolytics and Integrilin prior to surgery.   DESCRIPTION OF PROCEDURE:  The patient was brought to the operating room and  placed supine on the operating table where general anesthesia was induced  under invasive hemodynamic monitoring.  The chest, abdomen, and legs were  prepped with Betadine and draped as a sterile field.  A sternal incision was  made and the saphenous vein was harvested from the left leg using the  endoscopic technique.  The  left internal mammary artery was harvested as a  pedicle graft from its origin in the subclavian vessels and was a good  vessel with excellent flow.  Heparin was administered and ACT was documented  as being therapeutic.  The sternal retractor was placed.  The pericardium  was suspected.  The pursestring was placed in the ascending aorta and right  atrium.  The patient was cannulated and placed on bypass and cooled to 32  degrees.  The coronaries were identified for grafting and the mammary artery  and vein grafts were prepared for the distal anastomoses.  Cardioplegia  cannulas were placed for both antegrade aortic and retrograde coronary sinus  cardioplegia.  The patient was cooled to 30 degrees.  The aortic cross-clamp  was applied; 800 cc of cold-blood cardioplegia was delivered in split doses  between the antegrade aortic and retrograde coronary sinus catheters.  There  was good cardioplegia arrest and septal temperature dropped less than 12  degrees.  Topical iced saline was used to augment myocardial preservation.  A pericardial insulator pad was used to protect the left phrenic nerve.  Distal coronary anastomoses were then performed.  The first distal  anastomosis was the diagonal.  This was a 1.4-mm vessel proximal total  occlusion.  Reverse saphenous vein was sewn end-to-side with a running 7-0  Prolene and good flow through the graft.  A second distal anastomosis and  third distal anastomosis consisted of a sequential vein graft to the  posterior descending continuing to the posterolateral.  The posterior  descending was a 1.75-mm vessel, approximately 80% stenosis.  A side-to-side  anastomosis with the vein was sewn using running 7-0 Prolene and there was  good flow through the anastomosis.  The third distal anastomosis was a  continuation of the sequential vein in the posterolateral.  This was deep in the pericardium and had a total proximal occlusion.  It was a 1.5 to 1.7-mm   vessel with diffuse plaque and disease.  The end of the vein was sewn end-to-  side with running 7-0 Prolene.  There was good flow through the graft.  Cardioplegia was re-dosed.  The fourth distal anastomosis was at the distal  aspect of the left anterior descending.  It was a 1.2 to 1.4-mm vessel with  total proximal occlusion.  The left internal mammary artery pedicle was  brought through an opening created in the left lateral pericardium and  brought down under the left anterior descending and sewn end-to-side with  running 8-0 Prolene.  There was excellent flow through the anastomosis  with  immediate rise in septal temperature after release of the pedicle clamp on  the mammary artery.  The mammary pedicle was secured to the epicardium.  The  aortic cross-clamp was removed.  The heart was cardioverted back to a  regular rhythm.  Using a partial occluding clamp, two proximal vein  anastomoses were placed on the ascending aorta using a 4-mm punch and  running 6-0 Prolene.  The partial clamp was removed and the vein grafts were  perfused.  Each had good flow and hemostasis was documented to the proximal  and distal sites.  The patient was reperfused and rewarmed.  Temporary  pacing wires were applied.  The lungs were reexpanded and the ventilator was  resumed.  The patient was then weaned from bypass on balloon pump which was  resumed and low-dose dopamine.  Cardiac output and blood pressure were  adequate and the patient remained stable.  Protamine was administered  without adverse reaction.  The cannulas were removed.  The mediastinum was  irrigated with warm antibiotic irrigation.  The leg incision was irrigated  and closed in a standard fashion.  The pericardium was closed superiorly.  Two mediastinal and left pleural chest tubes were placed and brought out  through separate incisions.  The sternum was closed with interrupted steel  wire.  The pectoralis fascia was closed with a running  #1 Vicryl.  Subcutaneous and skin layers were closed with a running Vicryl and sterile  dressings were applied.  Total bypass time was 140 minutes with cross-clamp  time of 70 minutes.                                               Len Childs, M.D.    PV/MEDQ  D:  08/29/2003  T:  08/30/2003  Job:  [id number]   cc:   Ethelle Lyon, M.D. [facility]  [address] [place]  [place] [address]

## 2010-08-07 NOTE — H&P (Signed)
NAME:  Clayton Morton, Clayton Morton                           ACCOUNT NO.:  [id number]   MEDICAL RECORD NO.:  [id number]                   PATIENT TYPE:  INP   LOCATION:  NA                                   FACILITY:  [facility]   PHYSICIAN:  Deboraha Sprang, M.D.               DATE OF BIRTH:  08/30/1932   DATE OF ADMISSION:  08/27/2003  DATE OF DISCHARGE:                                HISTORY & PHYSICAL   CHIEF COMPLAINT:  The patient is a 77 year old gentleman referred from  [facility] because of ongoing chest discomfort and positive enzymes.   HISTORY OF PRESENT ILLNESS:  He describes the onset of chest tightness and  pressure today at about 4.  It was associated with some discomfort in his  neck.  He has had no radiation into his arm.  There is some nausea, some  diaphoresis and some shortness of breath.  The patient also had similar  symptoms that were transient yesterday.  He denies symptoms prior to  yesterday, but on further review with his family, he apparently has had  discomfort in his chest on and off for some period of time, though not  necessarily recognized as similar to today, and attributed in the past to a  hiatal hernia.  He denies exercise intolerance or claudication.  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  His cardiac risk factors are notable for diabetes.  His cholesterol status  is not known.  He has a family history.  He does not smoke and does not have  hypertension.  He denies palpitations or syncope.   PAST SURGICAL HISTORY:  He has had no prior surgery.   PAST MEDICAL HISTORY:  As noted above.   CURRENT MEDICATIONS:  DiaBeta 5 mg.   ALLERGIES:  TETRACYCLINE.   SOCIAL HISTORY:  He is married.  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  He has children who are  involved in his care.  He does not use cigarettes, alcohol, or recreational  drugs.   REVIEW OF SYSTEMS:   As noted above.   PHYSICAL EXAMINATION:  GENERAL:  He is an elderly Caucasian male, in no  significant distress, quite sleepy, having received Librium from his sister  at the onset of his symptoms.  VITAL SIGNS:  Blood pressure 145/75.  Notably it had been higher in the 170  range.  It had fallen to 70 with nitroglycerin, then responded to fluids and  the restoration of nitroglycerin.  His weight is not known.  Respirations 18  and unlabored.  HEENT:  Demonstrated no abnormalities.  NECK:  Neck veins were flat.  The carotids were brisk and full bilaterally.  Bruits were not appreciated, but they were examined in the midst of the cath  lab.  BACK:  His back was not  examined.  LUNGS:  Clear bilaterally.  HEART:  Sounds were regular.  An S4 is appreciated.  No significant murmurs.  ABDOMEN:  Soft with active bowel sounds, without midline pulsation or  hepatomegaly.  PULSES:  Femoral pulses were 2+, distal pulses were intact.  EXTREMITIES:  There is no clubbing, cyanosis, or edema.  NEUROLOGIC:  Grossly normal.  SKIN:  Warm and dry.   LABORATORY DATA:  Notable for potassium 3.3, sugar 325, BUN 21, creatinine  11.  Hemoglobin 14, hematocrit of 44.  His troponin was 1.1, CK 399, MB  fraction of 33.8.  Electrocardiograms sent from [facility] were  reviewed and demonstrated ST-segment depression in the anterior precordium  which were actually worse on arrival to [place]. [place] initial electrocardiogram was obtained at 1940 hours and demonstrated  sinus rhythm at 64, __________ of 0.19/0.14/0.43, with axis that was  leftward at -81.  There are T-wave inversions at that point across the  anterior precordium and 1 mm of ST-segment depression in lead V4 and V5.  An electrocardiogram obtained here at 2235 hours again demonstrated a  bifascicular block, but now had ST-segment depression of 2 mm in lead V2,  V3, V4, and 1 mm in V5 and V6.   IMPRESSION:  1. Non  Q-wave myocardial infarction.  2. Cardiac risk factors notable for     a. Diabetes.     b. Family history.     c. Questionable cholesterol status.  3. Bifascicular block.   PLAN:  The patient continues to have pain despite heparin, Integrilin and  aspirin.  We have withheld beta blockade because of his bifascicular block  and his bradycardia.  Because of his ongoing pain in the context of his  myocardial infarction, he will be sent for an urgent cardiac catheterization  with Dr. Ethelle Lyon.                                                Deboraha Sprang, M.D.    SCK/MEDQ  D:  08/27/2003  T:  08/28/2003  Job:  [id number]   cc:   Jerene Bears  [place]  [place] [address]  Fax: [place], [place]

## 2010-08-07 NOTE — Discharge Summary (Signed)
Clayton Morton, Clayton Morton                 ACCOUNT NO.:  [id number]   MEDICAL RECORD NO.:  [id number]          PATIENT TYPE:  INP   LOCATION:  3007                         FACILITY:  [facility]   PHYSICIAN:  Clayton P. Leonie Man, MD    DATE OF BIRTH:  [birth year]   DATE OF ADMISSION:  04/18/2004  DATE OF DISCHARGE:  04/21/2004                                 DISCHARGE SUMMARY   DISCHARGE DIAGNOSES:  1.  Small right subcortical infarct.  2.  Dyslipidemia.  3.  Hypertension.  4.  Diabetes.  5.  Coronary artery disease, status post coronary artery bypass grafting.   DISCHARGE MEDICATIONS:  1.  Aggrenox one p.o. daily x2 weeks, then increase to b.i.d.  2.  Lasix 20 mg a day.  3.  Potassium 10 mEq a day.  4.  Altace 5 mg b.i.d.  5.  Glyburide 5 mg daily.  6.  Vytorin 10/20 mg a day.  7.  Coreg 12.5 mg b.i.d.  8.  Toprol XL 25 mg daily, starting in 2 weeks when Coreg is completed.   STUDIES PERFORMED:  1.  CT scan of the head done at [facility] is reportedly normal with no acute      infarct.  2.  MRI of the brain shows chronic small vessel ischemic changes with no      acute abnormality.  3.  MRA of the head shows intracranial arterial disease in the medium sized      vessels.  4.  MRA of the neck shows right vertebral ends in PICA, no evidence of      significant atherosclerotic disease, mild stenosis in the proximal      basilar.  5.  Transesophageal echocardiogram shows an ejection fraction of 40-50% with      severe hypokinesis of the entire posterolateral wall.  Mild pulmonic      stenosis, left atrium dilated, mild mitral valve regurgitation, trivial      aortic valve regurgitation.  6.  Telemetry has shown normal sinus rhythm with EKG done at [facility],      showing again normal sinus rhythm with a right bundle branch block.  7.  Carotid Doppler shows no ICA stenosis.  8.  Transcranial Doppler performed, results pending.   LABORATORY DATA:  Hemoglobin A1c 6.4.  Homocysteine 6.66.  Lipids:   186,  triglycerides 175, HDL 34, and LDL 117.   HISTORY OF PRESENT ILLNESS:  [name] is a 77 year old white male who  developed the sudden onset of left facial and body weakness and paresthesias  45 minutes prior to arrival at [facility] the day of admission.  He had a CT  scan of the head which was negative for acute abnormality.  He noted a  significant impairment in left-sided strength.  Dr. Eber Morton from [facility]  called Dr. Leonie Morton and discussed giving IV TPA to this patient, however, it  was decided not to give this since the patient had no substantial  improvement in his left-sided strength since arrival.  The patient requested  transfer to [facility] for further evaluation.  He arrived  to [facility] without interval worsening in his symptoms.  He denies any  prior history of stroke, TIA, seizures, or migraines.  He was admitted for  further workup.   HOSPITAL COURSE:  MRI was unrevealing of an acute infarct though it was felt  he had a right subcortical infarct that was just not seen on MRI.  PT and OT  evaluated the patient and he had no therapy needs.  He was on aspirin prior  to admission and was changed to Aggrenox for secondary stroke prevention.  With a slightly elevated LDL, he was started on Zocor.  As the symptoms  remained resolved and the patient without therapy needs, he was discharged  home on Aggrenox.  He will need followup with Dr. Woody Morton in 2 weeks and Dr.  Leonie Morton in 2 months.  He was advised to lose weight.   CONDITION ON DISCHARGE:  He complains of a mild headache, otherwise normal  and intact.   PLAN:  1.  Discharge home.  No therapies.  2.  Follow up with Dr. Woody Morton in 2 weeks.  3.  Follow up with Dr. Leonie Morton in 2 months.  4.  Follow up __________ in 6-8 weeks.      SB/MEDQ  D:  04/21/2004  T:  04/21/2004  Job:  [id number]   cc:   Dr. Delma Morton, [place]   Clayton Morton, M.D.  [address]  [place]  [place] [address]

## 2010-08-07 NOTE — H&P (Signed)
Clayton Morton, Clayton Morton                 ACCOUNT NO.:  [id number]   MEDICAL RECORD NO.:  [id number]          PATIENT TYPE:  INP   LOCATION:  2931                         FACILITY:  [facility]   PHYSICIAN:  Pramod P. Leonie Man, MD    DATE OF BIRTH:  05-24-1932   DATE OF ADMISSION:  04/18/2004  DATE OF DISCHARGE:                                HISTORY & PHYSICAL   REFERRING PHYSICIAN:  Dr. Eber Hong from [place]:  Stroke.   HISTORY OF PRESENT ILLNESS:  Clayton Morton is a 77 year old gentleman who  developed sudden onset of left facial and body weakness and paresthesias  about 45 minutes prior to arrival at [facility] this afternoon.  The  patient had a CT scan of the head that was unremarkable.  He noticed  significant improvement in his left sided strength.  Dr. Eber Hong called me  and we discussed considered giving IV TPA to this patient; however, we  decided not to give it since he noticed substantial improvement in his left  sided strength.  The patient requested transfer to [facility] for  further evaluation.  He has remained stable during the transfer without any  interval worsening of his symptoms.  He denies any prior history of stroke,  TIA, seizures, or migraines.   PAST MEDICAL HISTORY:  1.  Hypertension.  2.  Diabetes.  3.  Hyperlipidemia.  4.  Coronary artery disease.  5.  Myocardial infarction.   PAST SURGICAL HISTORY:  Cardiac bypass surgery.   MEDICATIONS ALLERGIES:  TETRACYCLINE.   HOME MEDICATIONS:  Aspirin, Altace, Cogentin, Diabeta.   SOCIAL HISTORY:  The patient is married.  He does not currently smoke or  drink.  He is independent in activities of daily living.   REVIEW OF SYSTEMS:  Not significant for recent chest pain, cough, shortness  of breath, diarrhea, or other illness.   PHYSICAL EXAMINATION:  GENERAL:  Reveals a pleasant, middle-aged, Caucasian  male who is not in distress.  He is comfortable.  VITAL SIGNS:   Afebrile, pulse rate is 82 per minute regular, respirations 16  per minute, blood pressure 124/80.  EYES:  His pupils are __________.  HEAD:  Atraumatic.  NECK:  Supple without bruit.  ENT:  Unremarkable.  CARDIAC:  No murmur, gallop.  LUNGS:  Clear to auscultation.  NEUROLOGIC:  The patient is present, awake, alert, cooperative.  There is no  __________  or dysarthria.  Pupils are equally reactive to light and  accommodation.  Visual acuity and fields are clear.  Face is symmetric.  Palatal movements are normal.  Tongue is midline.  There is minimum  asymmetry of the left nasolabial fold.  There is no pronator drift.  Symmetric strength, tone, reflexes, coordination, sensation.  There is a  good left grip but there is diminished finger tapping on the left.  __________  the right or left upper extremity.  Sensation is intact.  Coordination is normal.  Gait was not tested.   DATA REVIEWED:  Non-contrast CAT scan of the head, done at [facility], is  reportedly  normal.   IMPRESSION:  [name] with small right subcortical infarct with  good __________ .  The patient did present within three hours of onset of  symptoms but thrombolysis was not done given near complete quick recovery.   PLAN:  1.  We will monitor the patient on telemetry.  2.  Obtain an MRI scan of the brain with MRA of the brain.  3.  Carotid and Doppler studies.  4.  Fasting lipid profile, hemoglobin A1c, homocystine levels.  5.  Check cardiac echo.  6.  Physical, occupational, and speech therapy consults.  7.  Continue aspirin for now.  8.  I had a long discussion with the patient regarding his evaluation,      treatment, and answered questions.      PPS/MEDQ  D:  04/18/2004  T:  04/18/2004  Job:  [id number]

## 2010-08-19 ENCOUNTER — Other Ambulatory Visit: Payer: Self-pay | Admitting: Neurosurgery

## 2010-08-19 DIAGNOSIS — M5126 Other intervertebral disc displacement, lumbar region: Secondary | ICD-10-CM

## 2010-08-20 ENCOUNTER — Ambulatory Visit
Admission: RE | Admit: 2010-08-20 | Discharge: 2010-08-20 | Disposition: A | Discharge status: Home / Self Care | Payer: Medicare Other | Source: Ambulatory Visit | Attending: Neurosurgery | Admitting: Neurosurgery

## 2010-08-20 DIAGNOSIS — M5126 Other intervertebral disc displacement, lumbar region: Secondary | ICD-10-CM

## 2010-09-30 ENCOUNTER — Other Ambulatory Visit: Payer: Self-pay | Admitting: Neurosurgery

## 2010-09-30 DIAGNOSIS — M5126 Other intervertebral disc displacement, lumbar region: Secondary | ICD-10-CM

## 2010-10-01 ENCOUNTER — Ambulatory Visit
Admission: RE | Admit: 2010-10-01 | Discharge: 2010-10-01 | Disposition: A | Discharge status: Home / Self Care | Payer: Medicare Other | Source: Ambulatory Visit | Attending: Neurosurgery | Admitting: Neurosurgery

## 2010-10-01 VITALS — BP 172/74 | HR 72

## 2010-10-01 DIAGNOSIS — M5126 Other intervertebral disc displacement, lumbar region: Secondary | ICD-10-CM

## 2010-10-01 DIAGNOSIS — M545 Low back pain, unspecified: Secondary | ICD-10-CM

## 2011-09-15 ENCOUNTER — Encounter: Payer: Self-pay | Admitting: Cardiology

## 2012-03-13 ENCOUNTER — Encounter: Payer: Self-pay | Admitting: Physician Assistant

## 2012-03-13 ENCOUNTER — Ambulatory Visit (INDEPENDENT_AMBULATORY_CARE_PROVIDER_SITE_OTHER): Payer: Medicare Other | Admitting: Physician Assistant

## 2012-03-13 VITALS — BP 155/80 | HR 81 | Ht 68.0 in | Wt 175.0 lb

## 2012-03-13 DIAGNOSIS — I2589 Other forms of chronic ischemic heart disease: Secondary | ICD-10-CM

## 2012-03-13 DIAGNOSIS — E785 Hyperlipidemia, unspecified: Secondary | ICD-10-CM

## 2012-03-13 DIAGNOSIS — I4949 Other premature depolarization: Secondary | ICD-10-CM

## 2012-03-13 DIAGNOSIS — I10 Essential (primary) hypertension: Secondary | ICD-10-CM

## 2012-03-13 DIAGNOSIS — I5022 Chronic systolic (congestive) heart failure: Secondary | ICD-10-CM

## 2012-03-13 DIAGNOSIS — I493 Ventricular premature depolarization: Secondary | ICD-10-CM | POA: Insufficient documentation

## 2012-03-13 MED ORDER — ROSUVASTATIN CALCIUM 20 MG PO TABS: 20.0000 mg | ORAL_TABLET | Freq: Every day | ORAL | Status: DC

## 2012-03-13 NOTE — Patient Instructions (Addendum)
Your physician recommends that you schedule a follow-up appointment in: 2-3 weeks. Your physician has recommended you make the following change in your medication: start crestor 20 mg daily. Your new prescription has been sent to your pharmacy. All other medications will remain the same. Your physician has requested that you have an echocardiogram. Echocardiography is a painless test that uses sound waves to create images of your heart. It provides your doctor with information about the size and shape of your heart and how well your heart's chambers and valves are working. This procedure takes approximately one hour. There are no restrictions for this procedure.

## 2012-03-13 NOTE — Progress Notes (Signed)
Primary Cardiologist: Johnny Bridge, MD   HPI: Patient returns to clinic after a prolonged hiatus, last seen here by [name] in December 2011.   Most recently, he had a routine followup echocardiogram earlier this month, in Dr. Woody Seller' office. This was performed on December 9, and notable for EF 25-30%; severe global HK with posterior HK, unable to exclude apical thrombus; mild/moderate MR; mild AR/TR/PR. He is now here in followup of this recent study.  Clinically, patient denies any interim development of exertional CP or significant DOE. He denies any symptoms suggestive of heart failure. He denies tachycardia palpitations, near syncope/syncope. Of note, he played golf 3 times a week from April through October.   12-lead EKG today, reviewed by me, indicates NSR with first degree AVB at approximately 75 bpm; frequent PVCs with underlying RBBB/LAHB  Allergies  Allergen Reactions  . Tetracycline     Current Outpatient Prescriptions  Medication Sig Dispense Refill  . ALPRAZolam (XANAX) 0.25 MG tablet Take 0.25 mg by mouth 2 (two) times daily as needed.       [name] amLODipine-olmesartan (AZOR) 5-40 MG per tablet Take 1 tablet by mouth daily.      [name] aspirin 81 MG tablet Take 81 mg by mouth daily.      [name] GLIPIZIDE XL 10 MG 24 hr tablet Take 10 mg by mouth 2 (two) times daily.       . LevOCARNitine (L-CARNITINE) 500 MG TABS Take 2 tablets by mouth daily.      . Lutein 40 MG CAPS Take 40 mg by mouth daily.      . metFORMIN (GLUCOPHAGE) 500 MG tablet Take 500 mg by mouth daily with breakfast.      . Multiple Vitamins-Minerals (ICAPS) TABS Take 1 tablet by mouth daily.      . Nutritional Supplements (NEURO-PLUS) CAPS Take 2 capsules by mouth daily.      . Omega-3 Fatty Acids (FISH OIL) 1000 MG CAPS Take 1 capsule by mouth 2 (two) times daily.      . rosuvastatin (CRESTOR) 20 MG tablet Take 1 tablet (20 mg total) by mouth daily.  90 tablet  3    Past Medical History  Diagnosis Date  .  Hypertension   . Cerebrovascular accident     right subcortical infarct 2006  . FHx: cataracts   . Macular degeneration     Dr. [name]  . CAD (coronary artery disease)     multivessel  . Hyperlipidemia   . Varicose veins   . Diabetes mellitus, type 2   . Myocardial infarction     Past Surgical History  Procedure Date  . Coronary artery bypass graft 2005    LIMA to LAD, SVG diagonal, SVG to PDA and PLA    History   Social History  . Marital Status: Married    Spouse Name: N/A    Number of Children: N/A  . Years of Education: N/A   Occupational History  . Not on file.   Social History Main Topics  . Smoking status: Never Smoker   . Smokeless tobacco: Not on file  . Alcohol Use: No  . Drug Use: No  . Sexually Active: Not on file   Other Topics Concern  . Not on file   Social History Narrative  . No narrative on file    No family history on file.  ROS: no nausea, vomiting; no fever, chills; no melena, hematochezia; no claudication  PHYSICAL EXAM: BP 155/80  Pulse 81  Ht 5\' 8"  (1.727 m)  Wt 175 lb (79.379 kg)  BMI 26.61 kg/m2 GENERAL: 78 year old male; NAD HEENT: NCAT, PERRLA, EOMI; sclera clear; no xanthelasma NECK: palpable bilateral carotid pulses, no bruits; no JVD; no TM LUNGS: CTA bilaterally CARDIAC: RRR (S1, S2); no significant murmurs; no rubs or gallops ABDOMEN: soft, non-tender; intact BS EXTREMETIES: no significant peripheral edema SKIN: warm/dry; no obvious rash/lesions MUSCULOSKELETAL: no joint deformity NEURO: no focal deficit; NL affect   EKG: reviewed and available in Electronic Records   ASSESSMENT & PLAN:  ISCHEMIC CARDIOMYOPATHY Following discussion with [name], recommendation is to order a limited transthoracic echocardiogram with Definity contrast for better visualization of the left ventricle, so as to better assess the EF and answer the question of possible apical thrombus. Following this, patient will return in the  next few weeks to follow up with [name] for review of study results and further recommendations. This will also help address the question of whether or not patient is a candidate for ICD implantation. Of note, this issue had been addressed in the past, but patient declined to proceed. We also discussed our preference to have followup echocardiograms performed here in our office, so that we may more closely follow any changes in the EF and review these results with the patient.  Ventricular ectopy Asymptomatic. Patient currently not on a beta blocker. Further recommendations to follow, pending review of repeat echocardiogram study.  HYPERLIPIDEMIA We'll renew prescription for Crestor. Aggressive management recommended with target LDL 70 or less, if feasible.    Gene Jt Brabec, PAC

## 2012-03-13 NOTE — Assessment & Plan Note (Signed)
Following discussion with [name], recommendation is to order a limited transthoracic echocardiogram with Definity contrast for better visualization of the left ventricle, so as to better assess the EF and answer the question of possible apical thrombus. Following this, patient will return in the next few weeks to follow up with [name] for review of study results and further recommendations. This will also help address the question of whether or not patient is a candidate for ICD implantation. Of note, this issue had been addressed in the past, but patient declined to proceed. We also discussed our preference to have followup echocardiograms performed here in our office, so that we may more closely follow any changes in the EF and review these results with the patient.

## 2012-03-13 NOTE — Assessment & Plan Note (Signed)
We'll renew prescription for Crestor. Aggressive management recommended with target LDL 70 or less, if feasible.

## 2012-03-13 NOTE — Assessment & Plan Note (Signed)
Asymptomatic. Patient currently not on a beta blocker. Further recommendations to follow, pending review of repeat echocardiogram study.

## 2012-03-16 ENCOUNTER — Other Ambulatory Visit: Payer: Self-pay | Admitting: *Deleted

## 2012-03-16 DIAGNOSIS — I5022 Chronic systolic (congestive) heart failure: Secondary | ICD-10-CM

## 2012-03-16 DIAGNOSIS — I10 Essential (primary) hypertension: Secondary | ICD-10-CM

## 2012-03-20 DIAGNOSIS — I2589 Other forms of chronic ischemic heart disease: Secondary | ICD-10-CM

## 2012-03-23 ENCOUNTER — Telehealth: Payer: Self-pay | Admitting: *Deleted

## 2012-03-23 NOTE — Telephone Encounter (Signed)
Notes Recorded by Laurine Blazer, LPN on [id number] at [id number] AM Patient notified & follow up OV already scheduled for 1/13 with [name].

## 2012-03-23 NOTE — Telephone Encounter (Signed)
Message copied by Laurine Blazer on Thu Mar 23, 2012 10:25 AM ------      Message from: Aurora Mask C      Created: Thu Mar 23, 2012  8:24 AM       EF 30-35%; no obvious thrombus. Pt to f/u with SM for review of these results.

## 2012-04-03 ENCOUNTER — Encounter: Payer: Self-pay | Admitting: Cardiology

## 2012-04-03 ENCOUNTER — Ambulatory Visit (INDEPENDENT_AMBULATORY_CARE_PROVIDER_SITE_OTHER): Payer: Medicare Other | Admitting: Cardiology

## 2012-04-03 VITALS — BP 169/77 | HR 77 | Ht 68.0 in | Wt 181.0 lb

## 2012-04-03 DIAGNOSIS — I10 Essential (primary) hypertension: Secondary | ICD-10-CM

## 2012-04-03 DIAGNOSIS — I2589 Other forms of chronic ischemic heart disease: Secondary | ICD-10-CM

## 2012-04-03 DIAGNOSIS — E785 Hyperlipidemia, unspecified: Secondary | ICD-10-CM

## 2012-04-03 NOTE — Assessment & Plan Note (Signed)
Blood pressure elevated today. He is on Azor at reasonable dose. Norvasc component could be advanced to 10 mg daily. Would also like to see him on Coreg.

## 2012-04-03 NOTE — Patient Instructions (Addendum)

## 2012-04-03 NOTE — Assessment & Plan Note (Signed)
LVEF approximately 30-35% with no evidence of LV thrombus by Definity contrast study done just recently. Symptomatically he is doing quite well, NYHA class 1-2 dyspnea, no angina, no palpitations or syncope. We have revisited the issue of prophylactic ICD placement, and he would prefer to talk about it more with his wife before actually arranging referral to EP for consultation. He is also comfortable with his current medications, did not want to make any specific changes today. Keep regular followup with Dr. Woody Seller, we will schedule more regular visits with cardiology.

## 2012-04-03 NOTE — Progress Notes (Signed)
Clinical Summary Clayton Morton is a 79 y.o.male presenting for followup. He was seen by Clayton Morton in December 2013. At that time he had undergone a recent echocardiogram at [facility] internal medicine demonstrating LVEF 25-30% with global hypokinesis particularly posterior wall, unable to exclude apical thrombus, mild to moderate mitral regurgitation, mild aortic and tricuspid regurgitation. LV dysfunction was not new based on previous testing, and in fact discussion had been had in the past regarding possible ICD placement, however the patient had declined.  Last ischemic testing in October 2011 via [place] demonstrated LVEF 29% with evidence of lateral scar, no active ischemia. Just recently on 12/31 the patient had a limited Definity contrast echocardiogram which excluded LV thrombus, LVEF graded at 30-35% with posterolateral akinesis. Reviewed these results the patient and his wife today.  Fortunately, he has been clinically quite stable over time, plays golf regularly, and remains active outdoors. He reports no angina, and describes NYHA class 1-2 dyspnea. No palpitations or dizziness.  His medical regimen is fairly limited. He has not been on beta blocker. We discussed the issue of possible prophylactic ICD again, and he again preferred to think about it prior to having [place] arrange an EP consultation.   Allergies  Allergen Reactions  . Tetracycline     Current Outpatient Prescriptions  Medication Sig Dispense Refill  . ALPRAZolam (XANAX) 0.25 MG tablet Take 0.25 mg by mouth 2 (two) times daily as needed.       [name] amLODipine-olmesartan (AZOR) 5-40 MG per tablet Take 1 tablet by mouth daily.      [name] aspirin 81 MG tablet Take 81 mg by mouth daily.      [name] GLIPIZIDE XL 10 MG 24 hr tablet Take 10 mg by mouth 2 (two) times daily.       . LevOCARNitine (L-CARNITINE) 500 MG TABS Take 2 tablets by mouth daily.      . Lutein 40 MG CAPS Take 40 mg by mouth daily.      . metFORMIN (GLUCOPHAGE) 500 MG tablet  Take 500 mg by mouth daily with breakfast.      . Multiple Vitamins-Minerals (ICAPS) TABS Take 1 tablet by mouth daily.      . Nutritional Supplements (NEURO-PLUS) CAPS Take 2 capsules by mouth daily.      . Omega-3 Fatty Acids (FISH OIL) 1000 MG CAPS Take 1 capsule by mouth 2 (two) times daily.      . rosuvastatin (CRESTOR) 20 MG tablet Take 1 tablet (20 mg total) by mouth daily.  90 tablet  3    Past Medical History  Diagnosis Date  . Essential hypertension, benign   . Cerebrovascular accident     Right subcortical infarct 2006  . Cataract   . Macular degeneration     Dr. [name]  . Coronary atherosclerosis of native coronary artery     Multivessel, LVEF approximately 30%  . Hyperlipidemia   . Varicose veins   . Diabetes mellitus, type 2   . Non-ST elevation MI (NSTEMI)     2005    Past Surgical History  Procedure Date  . Coronary artery bypass graft 2005    LIMA to LAD, SVG diagonal, SVG to PDA and PLA    Social History Clayton Morton reports that he has never smoked. He does not have any smokeless tobacco history on file. Clayton Morton reports that he does not drink alcohol.  Review of Systems Negative except as outlined.  Physical Examination Filed Vitals:   04/03/12 1510  BP: 169/77  Pulse: 77   Filed Weights   04/03/12 1510  Weight: 181 lb (82.101 kg)   No acute distress. HEENT: Conjunctiva and lids normal, oropharynx clear. Neck: Supple, no elevated JVP or carotid bruits, no thyromegaly. Lungs: Clear to auscultation, nonlabored breathing at rest. Cardiac: Regular rate and rhythm, no S3, indistinct PMI. Abdomen: Soft, nontender, bowel sounds present, no guarding or rebound. Extremities: No pitting edema, distal pulses 2+. Skin: Warm and dry. Musculoskeletal: No kyphosis. Neuropsychiatric: Alert and oriented x3, affect grossly appropriate.   Problem List and Plan   ISCHEMIC CARDIOMYOPATHY LVEF approximately 30-35% with no evidence of LV thrombus by Definity  contrast study done just recently. Symptomatically he is doing quite well, NYHA class 1-2 dyspnea, no angina, no palpitations or syncope. We have revisited the issue of prophylactic ICD placement, and he would prefer to talk about it more with his wife before actually arranging referral to EP for consultation. He is also comfortable with his current medications, did not want to make any specific changes today. Keep regular followup with Dr. Woody Seller, we will schedule more regular visits with cardiology.  HYPERTENSION Blood pressure elevated today. He is on Azor at reasonable dose. Norvasc component could be advanced to 10 mg daily. Would also like to see him on Coreg.  HYPERLIPIDEMIA Continues on Crestor, followed by Dr. Woody Seller.    Satira Sark, M.D., F.A.C.C.

## 2012-04-03 NOTE — Assessment & Plan Note (Signed)
Continues on Crestor, followed by Dr. Woody Seller.

## 2012-12-20 ENCOUNTER — Ambulatory Visit (INDEPENDENT_AMBULATORY_CARE_PROVIDER_SITE_OTHER): Payer: Medicare Other | Admitting: Cardiology

## 2012-12-20 ENCOUNTER — Encounter: Payer: Self-pay | Admitting: Cardiology

## 2012-12-20 VITALS — BP 136/72 | HR 70 | Ht 68.0 in | Wt 176.0 lb

## 2012-12-20 DIAGNOSIS — E785 Hyperlipidemia, unspecified: Secondary | ICD-10-CM

## 2012-12-20 DIAGNOSIS — I2589 Other forms of chronic ischemic heart disease: Secondary | ICD-10-CM

## 2012-12-20 DIAGNOSIS — I10 Essential (primary) hypertension: Secondary | ICD-10-CM

## 2012-12-20 NOTE — Assessment & Plan Note (Signed)
No change to current regimen. 

## 2012-12-20 NOTE — Assessment & Plan Note (Signed)
Clinically stable, LVEF 30-35%. As noted above, he prefers continued conservative observation on medical therapy. I encouraged him to remain active, report any new symptoms. Regular followup arranged.

## 2012-12-20 NOTE — Progress Notes (Signed)
Clinical Summary Mr. Bowlin is a 79 y.o.male last seen in January of this year. He states that he has been doing very well, no angina, NYHA class 1-2 dyspnea, no orthopnea or PND. He continues to play golf 3 days a week. Reports compliance with his medications. He states that he will be having lab work with Dr. Woody Seller at a pending visit in the near future.  He and I discussed prophylactic ICD considerations again today. He still remains of the mind that he would not pursue this at the current time, might consider it in the future. He denies any palpitations or sudden syncope.   Allergies  Allergen Reactions  . Tetracycline     Current Outpatient Prescriptions  Medication Sig Dispense Refill  . ALPRAZolam (XANAX) 0.25 MG tablet Take 0.25 mg by mouth 2 (two) times daily as needed.       [name] amLODipine-olmesartan (AZOR) 5-40 MG per tablet Take 1 tablet by mouth daily.      [name] aspirin 81 MG tablet Take 81 mg by mouth daily.      [name] GLIPIZIDE XL 10 MG 24 hr tablet Take 10 mg by mouth 2 (two) times daily.       . LevOCARNitine (L-CARNITINE) 500 MG TABS Take 2 tablets by mouth daily.      [name] levothyroxine (SYNTHROID, LEVOTHROID) 25 MCG tablet Take 25 mcg by mouth daily before breakfast.       . Lutein 40 MG CAPS Take 40 mg by mouth daily.      . metFORMIN (GLUCOPHAGE) 500 MG tablet Take 500 mg by mouth daily with breakfast.      . Multiple Vitamins-Minerals (ICAPS) TABS Take 1 tablet by mouth daily.      . Omega-3 Fatty Acids (FISH OIL) 1000 MG CAPS Take 1 capsule by mouth 2 (two) times daily.      . rosuvastatin (CRESTOR) 20 MG tablet Take 1 tablet (20 mg total) by mouth daily.  90 tablet  3   No current facility-administered medications for this visit.    Past Medical History  Diagnosis Date  . Essential hypertension, benign   . Cerebrovascular accident     Right subcortical infarct 2006  . Cataract   . Macular degeneration     Dr. [name]  . Coronary atherosclerosis of native coronary  artery     Multivessel, LVEF approximately 30%  . Hyperlipidemia   . Varicose veins   . Diabetes mellitus, type 2   . Non-ST elevation MI (NSTEMI)     2005    Past Surgical History  Procedure Laterality Date  . Coronary artery bypass graft  2005    LIMA to LAD, SVG diagonal, SVG to PDA and PLA    Social History Mr. Kenaston reports that he has never smoked. He does not have any smokeless tobacco history on file. Mr. Oh reports that he does not drink alcohol.  Review of Systems Negative except as outlined above.  Physical Examination Filed Vitals:   12/20/12 0802  BP: 136/72  Pulse: 70   Filed Weights   12/20/12 0802  Weight: 176 lb (79.833 kg)    No acute distress.  HEENT: Conjunctiva and lids normal, oropharynx clear.  Neck: Supple, no elevated JVP or carotid bruits, no thyromegaly.  Lungs: Clear to auscultation, nonlabored breathing at rest.  Cardiac: Regular rate and rhythm, no S3, indistinct PMI.  Abdomen: Soft, nontender, bowel sounds present, no guarding or rebound.  Extremities: No pitting edema, distal pulses  2+.  Skin: Warm and dry.  Musculoskeletal: No kyphosis.  Neuropsychiatric: Alert and oriented x3, affect grossly appropriate.   Problem List and Plan   ISCHEMIC CARDIOMYOPATHY Clinically stable, LVEF 30-35%. As noted above, he prefers continued conservative observation on medical therapy. I encouraged him to remain active, report any new symptoms. Regular followup arranged.  HYPERTENSION No change to current regimen.  HYPERLIPIDEMIA Reports pending visit with Dr. Woody Seller. He is due for followup FLP and LFT.    Satira Sark, M.D., F.A.C.C.

## 2012-12-20 NOTE — Assessment & Plan Note (Signed)
Reports pending visit with Dr. Woody Seller. He is due for followup FLP and LFT.

## 2012-12-20 NOTE — Patient Instructions (Addendum)
Your physician recommends that you schedule a follow-up appointment in: 6 months with [name]. You should receive a letter in the mail in 4  months. If you do not receive this letter by February 2015 call our office to schedule this appointment.   Your physician recommends that you continue on your current medications as directed. Please refer to the Current Medication list given to you today.

## 2013-05-29 ENCOUNTER — Encounter: Payer: Self-pay | Admitting: Cardiology

## 2013-05-29 ENCOUNTER — Ambulatory Visit (INDEPENDENT_AMBULATORY_CARE_PROVIDER_SITE_OTHER): Payer: Medicare Other | Admitting: Cardiology

## 2013-05-29 VITALS — BP 160/67 | HR 81 | Ht 68.0 in | Wt 177.4 lb

## 2013-05-29 DIAGNOSIS — I252 Old myocardial infarction: Secondary | ICD-10-CM

## 2013-05-29 DIAGNOSIS — I2589 Other forms of chronic ischemic heart disease: Secondary | ICD-10-CM

## 2013-05-29 DIAGNOSIS — I4510 Unspecified right bundle-branch block: Secondary | ICD-10-CM

## 2013-05-29 DIAGNOSIS — E785 Hyperlipidemia, unspecified: Secondary | ICD-10-CM

## 2013-05-29 DIAGNOSIS — I4949 Other premature depolarization: Secondary | ICD-10-CM

## 2013-05-29 NOTE — Assessment & Plan Note (Signed)
Continues on Crestor.

## 2013-05-29 NOTE — Progress Notes (Signed)
Clinical Summary [name] is a 80 y.o.male last seen in October 2014. He continues to do well, denies angina or progressive shortness of breath over NYHA class II. He reports compliance with his medications.  He has preferred medical therapy for management of his ischemic cardiomyopathy, has not wanted to pursue device options.  ECG today shows sinus rhythm with right bundle branch block and left anterior fascicular block. Last ischemic testing in October 2011 via [place] demonstrated LVEF 29% with evidence of lateral scar, no active ischemia. LVEF by echocardiogram December 2013 was 30-35% with similar wall motion abnormalities.  He continues to follow with Dr. Woody Seller for lab work and routine health maintenance.   Allergies  Allergen Reactions  . Tetracycline     Current Outpatient Prescriptions  Medication Sig Dispense Refill  . ALPRAZolam (XANAX) 0.25 MG tablet Take 0.25 mg by mouth 2 (two) times daily as needed.       [name] amLODipine-olmesartan (AZOR) 5-40 MG per tablet Take 1 tablet by mouth daily.      [name] aspirin 81 MG tablet Take 81 mg by mouth daily.      [name] GLIPIZIDE XL 10 MG 24 hr tablet Take 10 mg by mouth 2 (two) times daily.       . LevOCARNitine (L-CARNITINE) 500 MG TABS Take 2 tablets by mouth daily.      [name] levothyroxine (SYNTHROID, LEVOTHROID) 50 MCG tablet Take 50 mcg by mouth daily.      . Lutein 40 MG CAPS Take 40 mg by mouth daily.      . metFORMIN (GLUCOPHAGE) 500 MG tablet Take 500 mg by mouth daily with breakfast.      . Multiple Vitamins-Minerals (ICAPS) TABS Take 1 tablet by mouth daily.      . Omega-3 Fatty Acids (FISH OIL) 1000 MG CAPS Take 1 capsule by mouth 2 (two) times daily.      . rosuvastatin (CRESTOR) 20 MG tablet Take 1 tablet (20 mg total) by mouth daily.  90 tablet  3   No current facility-administered medications for this visit.    Past Medical History  Diagnosis Date  . Essential hypertension, benign   . Cerebrovascular accident     Right  subcortical infarct 2006  . Cataract   . Macular degeneration     Dr. [name]  . Coronary atherosclerosis of native coronary artery     Multivessel, LVEF approximately 30%  . Hyperlipidemia   . Varicose veins   . Diabetes mellitus, type 2   . Non-ST elevation MI (NSTEMI)     2005    Past Surgical History  Procedure Laterality Date  . Coronary artery bypass graft  2005    LIMA to LAD, SVG diagonal, SVG to PDA and PLA    Social History Mr. Delacy reports that he has never smoked. He does not have any smokeless tobacco history on file. Mr. Len reports that he does not drink alcohol.  Review of Systems No orthopnea or PND, no leg edema, no claudication. Otherwise as outlined.  Physical Examination Filed Vitals:   05/29/13 1459  BP: 160/67  Pulse: 81   Filed Weights   05/29/13 1459  Weight: 177 lb 6.4 oz (80.468 kg)    Appears comfortable at rest.  HEENT: Conjunctiva and lids normal, oropharynx clear.  Neck: Supple, no elevated JVP or carotid bruits, no thyromegaly.  Lungs: Clear to auscultation, nonlabored breathing at rest.  Cardiac: Regular rate and rhythm, no S3, indistinct PMI.  Abdomen:  Soft, nontender, bowel sounds present, no guarding or rebound.  Extremities: No pitting edema, distal pulses 2+.  Skin: Warm and dry.  Musculoskeletal: No kyphosis.  Neuropsychiatric: Alert and oriented x3, affect grossly appropriate.   Problem List and Plan   ISCHEMIC CARDIOMYOPATHY Fortunately has been clinically stable, LVEF 30-35%. As noted above, he prefers continued conservative observation on medical therapy. Keep regular followup with Dr. Woody Seller - if tolerated suggest adding a low-dose beta blocker such as Coreg. Regimen also includes aspirin, Norvasc, olmesartan, and Crestor. No standing diuretic.  HYPERLIPIDEMIA Continues on Crestor.    Satira Sark, M.D., F.A.C.C.

## 2013-05-29 NOTE — Assessment & Plan Note (Addendum)
Fortunately has been clinically stable, LVEF 30-35%. As noted above, he prefers continued conservative observation on medical therapy. Keep regular followup with Dr. Woody Seller - if tolerated suggest adding a low-dose beta blocker such as Coreg. Regimen also includes aspirin, Norvasc, olmesartan, and Crestor. No standing diuretic.

## 2013-05-29 NOTE — Patient Instructions (Signed)

## 2013-11-20 ENCOUNTER — Encounter: Payer: Self-pay | Admitting: Cardiology

## 2013-11-20 ENCOUNTER — Ambulatory Visit (INDEPENDENT_AMBULATORY_CARE_PROVIDER_SITE_OTHER): Payer: Medicare Other | Admitting: Cardiology

## 2013-11-20 ENCOUNTER — Telehealth: Payer: Self-pay | Admitting: Cardiology

## 2013-11-20 ENCOUNTER — Encounter: Payer: Self-pay | Admitting: *Deleted

## 2013-11-20 VITALS — BP 152/95 | HR 87 | Ht 68.0 in | Wt 167.0 lb

## 2013-11-20 DIAGNOSIS — I10 Essential (primary) hypertension: Secondary | ICD-10-CM

## 2013-11-20 DIAGNOSIS — I2510 Atherosclerotic heart disease of native coronary artery without angina pectoris: Secondary | ICD-10-CM

## 2013-11-20 DIAGNOSIS — I2589 Other forms of chronic ischemic heart disease: Secondary | ICD-10-CM

## 2013-11-20 DIAGNOSIS — R0602 Shortness of breath: Secondary | ICD-10-CM

## 2013-11-20 MED ORDER — CARVEDILOL 3.125 MG PO TABS: 3.1250 mg | ORAL_TABLET | Freq: Two times a day (BID) | ORAL | Status: DC

## 2013-11-20 NOTE — Assessment & Plan Note (Signed)
Known history, has been managed medically per patient preference. He has been increasingly more short of breath, could be multifactorial, but need objective cardiac reassessment. We will start Coreg 3.125 mg twice daily, continue his remaining medications. Followup echocardiogram and Lexiscan Cardiolite, with planned review in the office. We can discuss the next step.

## 2013-11-20 NOTE — Assessment & Plan Note (Signed)
History of multivessel disease status post CABG in 2005. Could have potentially developed graft disease over the years. He did not have any large ischemic zones by last Cardiolite 2011.

## 2013-11-20 NOTE — Progress Notes (Signed)
Clinical Summary Clayton Morton is a 80 y.o.male last seen in March. He states that since that time he has been increasingly more short of breath with activity. Reports a "lung infection" in the Spring, has had worsening symptoms over the summer. No chest pain however. No orthopnea or PND at nighttime. No leg edema. He reports compliance with his medications. No cough, fevers or chills. States he had a chest x-ray with Clayton Morton and things were "clear."  He has preferred medical therapy for management of his ischemic cardiomyopathy, has not wanted to pursue device options. Last ischemic testing in October 2011 via [facility] demonstrated LVEF 29% with evidence of lateral scar, no active ischemia. LVEF by echocardiogram December 2013 was 30-35% with similar wall motion abnormalities.   Allergies  Allergen Reactions  . Tetracycline     Current Outpatient Prescriptions  Medication Sig Dispense Refill  . ALPRAZolam (XANAX) 0.25 MG tablet Take 0.25 mg by mouth 2 (two) times daily as needed.       [name] amLODipine-olmesartan (AZOR) 5-40 MG per tablet Take 1 tablet by mouth daily.      [name] aspirin 81 MG tablet Take 81 mg by mouth daily.      . Cyanocobalamin (VITAMIN B-12) 1000 MCG SUBL Place under the tongue daily.      [name] GLIPIZIDE XL 10 MG 24 hr tablet Take 10 mg by mouth 2 (two) times daily.       . LevOCARNitine (L-CARNITINE) 500 MG TABS Take 2 tablets by mouth daily.      [name] levothyroxine (SYNTHROID, LEVOTHROID) 50 MCG tablet Take 50 mcg by mouth daily.      . Lutein 40 MG CAPS Take 40 mg by mouth daily.      . metFORMIN (GLUCOPHAGE) 500 MG tablet Take 500 mg by mouth daily with breakfast.      . Multiple Vitamins-Minerals (ICAPS) TABS Take 1 tablet by mouth daily.      . Omega-3 Fatty Acids (FISH OIL) 1000 MG CAPS Take 1 capsule by mouth 2 (two) times daily.      . rosuvastatin (CRESTOR) 20 MG tablet Take 1 tablet (20 mg total) by mouth daily.  90 tablet  3  . carvedilol (COREG) 3.125 MG tablet Take 1  tablet (3.125 mg total) by mouth 2 (two) times daily.  180 tablet  1   No current facility-administered medications for this visit.    Past Medical History  Diagnosis Date  . Essential hypertension, benign   . Cerebrovascular accident     Right subcortical infarct 2006  . Cataract   . Macular degeneration     Clayton Morton  . Coronary atherosclerosis of native coronary artery     Multivessel, LVEF approximately 30%  . Hyperlipidemia   . Varicose veins   . Diabetes mellitus, type 2   . Non-ST elevation MI (NSTEMI)     2005    Past Surgical History  Procedure Laterality Date  . Coronary artery bypass graft  2005    LIMA to LAD, SVG diagonal, SVG to PDA and PLA    Social History Clayton Morton reports that he has never smoked. He does not have any smokeless tobacco history on file. Clayton Morton reports that he does not drink alcohol.  Review of Systems Other systems reviewed and negative except as outlined.  Physical Examination Filed Vitals:   11/20/13 1136  BP: 152/95  Pulse: 87   Filed Weights   11/20/13 1136  Weight: 167 lb (75.751  kg)    Appears comfortable at rest.  HEENT: Conjunctiva and lids normal, oropharynx clear.  Neck: Supple, no elevated JVP or carotid bruits, no thyromegaly.  Lungs: Clear to auscultation, nonlabored breathing at rest.  Cardiac: Regular rate and rhythm, indistinct PMI. Possible soft S3. Abdomen: Soft, nontender, bowel sounds present, no guarding or rebound.  Extremities: Trace edema, distal pulses 2+.  Skin: Warm and dry.  Musculoskeletal: No kyphosis.  Neuropsychiatric: Alert and oriented x3, affect grossly appropriate.   Problem List and Plan   ISCHEMIC CARDIOMYOPATHY Known history, has been managed medically per patient preference. He has been increasingly more short of breath, could be multifactorial, but need objective cardiac reassessment. We will start Coreg 3.125 mg twice daily, continue his remaining medications. Followup  echocardiogram and Lexiscan Cardiolite, with planned review in the office. We can discuss the next step.  Coronary atherosclerosis of native coronary artery History of multivessel disease status post CABG in 2005. Could have potentially developed graft disease over the years. He did not have any large ischemic zones by last Cardiolite 2011.  HYPERTENSION Adding Coreg to regimen.    Clayton Morton, M.D., F.A.C.C.

## 2013-11-20 NOTE — Assessment & Plan Note (Signed)
Adding Coreg to regimen.

## 2013-11-20 NOTE — Telephone Encounter (Signed)
No precert required 

## 2013-11-20 NOTE — Telephone Encounter (Signed)
2 D Echo dx: SOB, CAD Lexiscan Cardiolite dx:SOB, CAD  Echo schedule at [facility] on 9/4 @ 12:45  Lexiscan schedule 9/8 arrive at 8:45

## 2013-11-20 NOTE — Patient Instructions (Signed)
Your physician recommends that you schedule a follow-up appointment in: 3 weeks. Your physician has recommended you make the following change in your medication:  Start carvedilol 3.125 mg by mouth twice daily. Continue all other medications the same. Your physician has requested that you have an echocardiogram. Echocardiography is a painless test that uses sound waves to create images of your heart. It provides your doctor with information about the size and shape of your heart and how well your heart's chambers and valves are working. This procedure takes approximately one hour. There are no restrictions for this procedure. Your physician has requested that you have a lexiscan myoview. For further information please visit [email or web address]. Please follow instruction sheet, as given.

## 2013-11-23 ENCOUNTER — Telehealth: Payer: Self-pay

## 2013-11-23 ENCOUNTER — Ambulatory Visit (HOSPITAL_COMMUNITY)
Admission: RE | Admit: 2013-11-23 | Discharge: 2013-11-23 | Disposition: A | Discharge status: Home / Self Care | Payer: Medicare Other | Source: Ambulatory Visit | Attending: Cardiology | Admitting: Cardiology

## 2013-11-23 DIAGNOSIS — I252 Old myocardial infarction: Secondary | ICD-10-CM | POA: Insufficient documentation

## 2013-11-23 DIAGNOSIS — I517 Cardiomegaly: Secondary | ICD-10-CM | POA: Diagnosis not present

## 2013-11-23 DIAGNOSIS — I2510 Atherosclerotic heart disease of native coronary artery without angina pectoris: Secondary | ICD-10-CM

## 2013-11-23 DIAGNOSIS — I10 Essential (primary) hypertension: Secondary | ICD-10-CM | POA: Insufficient documentation

## 2013-11-23 DIAGNOSIS — I5189 Other ill-defined heart diseases: Secondary | ICD-10-CM | POA: Insufficient documentation

## 2013-11-23 DIAGNOSIS — R0602 Shortness of breath: Secondary | ICD-10-CM

## 2013-11-23 DIAGNOSIS — E119 Type 2 diabetes mellitus without complications: Secondary | ICD-10-CM | POA: Insufficient documentation

## 2013-11-23 DIAGNOSIS — I519 Heart disease, unspecified: Secondary | ICD-10-CM | POA: Insufficient documentation

## 2013-11-23 DIAGNOSIS — I369 Nonrheumatic tricuspid valve disorder, unspecified: Secondary | ICD-10-CM

## 2013-11-23 DIAGNOSIS — I080 Rheumatic disorders of both mitral and aortic valves: Secondary | ICD-10-CM | POA: Insufficient documentation

## 2013-11-23 DIAGNOSIS — Z8673 Personal history of transient ischemic attack (TIA), and cerebral infarction without residual deficits: Secondary | ICD-10-CM | POA: Diagnosis not present

## 2013-11-23 DIAGNOSIS — E785 Hyperlipidemia, unspecified: Secondary | ICD-10-CM | POA: Insufficient documentation

## 2013-11-23 NOTE — Progress Notes (Signed)
  Echocardiogram 2D Echocardiogram has been performed.  [place], [place] 11/23/2013, 4:19 PM

## 2013-11-23 NOTE — Telephone Encounter (Signed)
I spoke with wife,as husband is asleep.She understands not to have stress lexi on Tuesday 9/8. I told her to expect a call from the [place] office on Tuesday.  I will forward to [facility] office so staff can contact pt with further recommendations

## 2013-11-23 NOTE — Telephone Encounter (Signed)
Message copied by Bernita Raisin on Fri Nov 23, 2013  5:25 PM ------      Message from: Hettick F      Created: Fri Nov 23, 2013  5:19 PM       This is a regular patient of Dr Domenic Polite. Please let him know that I have read his echo today and his heart function is severely weakened from before. With this information I am not sure Dr Domenic Polite would still want to do a stress test on Tuesday. He is not in the office currently. Please cancel the stress test, I will send message to Dr Domenic Polite and await what he wants to do                  Zandra Abts MD ------

## 2013-11-25 ENCOUNTER — Emergency Department (HOSPITAL_COMMUNITY)
Admission: EM | Admit: 2013-11-25 | Discharge: 2013-11-26 | Disposition: A | Discharge status: Home / Self Care | Payer: Medicare Other | Source: Home / Self Care | Attending: Emergency Medicine | Admitting: Emergency Medicine

## 2013-11-25 DIAGNOSIS — R0602 Shortness of breath: Secondary | ICD-10-CM | POA: Insufficient documentation

## 2013-11-25 DIAGNOSIS — R4789 Other speech disturbances: Secondary | ICD-10-CM | POA: Insufficient documentation

## 2013-11-25 DIAGNOSIS — H269 Unspecified cataract: Secondary | ICD-10-CM | POA: Insufficient documentation

## 2013-11-25 DIAGNOSIS — I5023 Acute on chronic systolic (congestive) heart failure: Secondary | ICD-10-CM | POA: Diagnosis not present

## 2013-11-25 DIAGNOSIS — I252 Old myocardial infarction: Secondary | ICD-10-CM | POA: Insufficient documentation

## 2013-11-25 DIAGNOSIS — E785 Hyperlipidemia, unspecified: Secondary | ICD-10-CM | POA: Insufficient documentation

## 2013-11-25 DIAGNOSIS — I2510 Atherosclerotic heart disease of native coronary artery without angina pectoris: Secondary | ICD-10-CM | POA: Insufficient documentation

## 2013-11-25 DIAGNOSIS — E119 Type 2 diabetes mellitus without complications: Secondary | ICD-10-CM

## 2013-11-25 DIAGNOSIS — I509 Heart failure, unspecified: Secondary | ICD-10-CM | POA: Diagnosis not present

## 2013-11-25 DIAGNOSIS — Z951 Presence of aortocoronary bypass graft: Secondary | ICD-10-CM | POA: Insufficient documentation

## 2013-11-25 DIAGNOSIS — Z79899 Other long term (current) drug therapy: Secondary | ICD-10-CM

## 2013-11-25 DIAGNOSIS — Z7982 Long term (current) use of aspirin: Secondary | ICD-10-CM

## 2013-11-25 DIAGNOSIS — J40 Bronchitis, not specified as acute or chronic: Secondary | ICD-10-CM | POA: Insufficient documentation

## 2013-11-25 DIAGNOSIS — J209 Acute bronchitis, unspecified: Secondary | ICD-10-CM

## 2013-11-25 DIAGNOSIS — I10 Essential (primary) hypertension: Secondary | ICD-10-CM | POA: Insufficient documentation

## 2013-11-25 DIAGNOSIS — Z8673 Personal history of transient ischemic attack (TIA), and cerebral infarction without residual deficits: Secondary | ICD-10-CM

## 2013-11-25 NOTE — ED Notes (Signed)
Pt arrives here via EMS. Pt called EMS due to being sob and having generalized weakness. Pt reports to EMS and this nurse that he feels better now, although pt seems to be sob while talking. Pt denies chest pain.

## 2013-11-25 NOTE — ED Notes (Signed)
Pt with c/o sob onset tonight, in by ems, no chest pain.

## 2013-11-26 ENCOUNTER — Emergency Department (HOSPITAL_COMMUNITY): Payer: Medicare Other

## 2013-11-26 ENCOUNTER — Encounter (HOSPITAL_COMMUNITY): Payer: Self-pay | Admitting: Emergency Medicine

## 2013-11-26 LAB — COMPREHENSIVE METABOLIC PANEL
ALT: 17 U/L (ref 0–53)
AST: 11 U/L (ref 0–37)
Albumin: 3.1 g/dL — ABNORMAL LOW (ref 3.5–5.2)
Alkaline Phosphatase: 79 U/L (ref 39–117)
Anion gap: 13 (ref 5–15)
BUN: 28 mg/dL — ABNORMAL HIGH (ref 6–23)
CO2: 22 mEq/L (ref 19–32)
Calcium: 8.9 mg/dL (ref 8.4–10.5)
Chloride: 107 mEq/L (ref 96–112)
Creatinine, Ser: 1.47 mg/dL — ABNORMAL HIGH (ref 0.50–1.35)
GFR calc Af Amer: 51 mL/min — ABNORMAL LOW (ref >90)
GFR calc non Af Amer: 44 mL/min — ABNORMAL LOW (ref >90)
Glucose, Bld: 183 mg/dL — ABNORMAL HIGH (ref 70–99)
Potassium: 3.9 mEq/L (ref 3.7–5.3)
Sodium: 142 mEq/L (ref 137–147)
Total Bilirubin: 0.4 mg/dL (ref 0.3–1.2)
Total Protein: 6.3 g/dL (ref 6.0–8.3)

## 2013-11-26 LAB — CBC WITH DIFFERENTIAL/PLATELET
Basophils Absolute: 0 10*3/uL (ref 0.0–0.1)
Basophils Relative: 0 % (ref 0–1)
Eosinophils Absolute: 0.2 10*3/uL (ref 0.0–0.7)
Eosinophils Relative: 4 % (ref 0–5)
HCT: 37.9 % — ABNORMAL LOW (ref 39.0–52.0)
Hemoglobin: 12.7 g/dL — ABNORMAL LOW (ref 13.0–17.0)
Lymphocytes Relative: 15 % (ref 12–46)
Lymphs Abs: 0.9 10*3/uL (ref 0.7–4.0)
MCH: 30.5 pg (ref 26.0–34.0)
MCHC: 33.5 g/dL (ref 30.0–36.0)
MCV: 91.1 fL (ref 78.0–100.0)
Monocytes Absolute: 0.6 10*3/uL (ref 0.1–1.0)
Monocytes Relative: 10 % (ref 3–12)
Neutro Abs: 3.9 10*3/uL (ref 1.7–7.7)
Neutrophils Relative %: 71 % (ref 43–77)
Platelets: 198 10*3/uL (ref 150–400)
RBC: 4.16 MIL/uL — ABNORMAL LOW (ref 4.22–5.81)
RDW: 14.2 % (ref 11.5–15.5)
WBC: 5.6 10*3/uL (ref 4.0–10.5)

## 2013-11-26 LAB — URINALYSIS, ROUTINE W REFLEX MICROSCOPIC
Bilirubin Urine: NEGATIVE
Glucose, UA: NEGATIVE mg/dL
Ketones, ur: NEGATIVE mg/dL
Leukocytes, UA: NEGATIVE
Nitrite: NEGATIVE
Protein, ur: >300 mg/dL — AB
Specific Gravity, Urine: >1.03 — ABNORMAL HIGH (ref 1.005–1.030)
Urobilinogen, UA: 1 mg/dL (ref 0.0–1.0)
pH: 5.5 (ref 5.0–8.0)

## 2013-11-26 LAB — PRO B NATRIURETIC PEPTIDE: Pro B Natriuretic peptide (BNP): 20669 pg/mL — ABNORMAL HIGH (ref 0–450)

## 2013-11-26 LAB — URINE MICROSCOPIC-ADD ON

## 2013-11-26 LAB — PROTIME-INR
INR: 1.13 (ref 0.00–1.49)
Prothrombin Time: 14.5 seconds (ref 11.6–15.2)

## 2013-11-26 LAB — APTT: aPTT: 29 seconds (ref 24–37)

## 2013-11-26 MED ORDER — ALBUTEROL SULFATE HFA 108 (90 BASE) MCG/ACT IN AERS: 1.0000 | INHALATION_SPRAY | Freq: Four times a day (QID) | RESPIRATORY_TRACT | Status: DC | PRN

## 2013-11-26 MED ORDER — ALBUTEROL SULFATE (2.5 MG/3ML) 0.083% IN NEBU
5.0000 mg | INHALATION_SOLUTION | Freq: Once | RESPIRATORY_TRACT | Status: AC
  Administered 2013-11-26: 5 mg via RESPIRATORY_TRACT
  Filled 2013-11-26: qty 6

## 2013-11-26 MED ORDER — PREDNISONE 50 MG PO TABS: 50.0000 mg | ORAL_TABLET | Freq: Every day | ORAL | Status: DC

## 2013-11-26 MED ORDER — METHYLPREDNISOLONE SODIUM SUCC 125 MG IJ SOLR
125.0000 mg | Freq: Once | INTRAMUSCULAR | Status: AC
  Administered 2013-11-26: 125 mg via INTRAVENOUS
  Filled 2013-11-26: qty 2

## 2013-11-26 NOTE — ED Notes (Signed)
Pt receiving breathing treatment.

## 2013-11-26 NOTE — ED Provider Notes (Signed)
CSN: [id number]     Arrival date & time 11/25/13  2351 History   First MD Initiated Contact with Patient 11/25/13 2359    This chart was scribed for Clayton Rice, MD by Edison Simon, ED Scribe. This patient was seen in room [facility] and the patient's care was started at 12:03 AM.   Chief Complaint  Patient presents with  . Shortness of Breath   The history is provided by the patient. No language interpreter was used.   Clayton Morton is a 80 y.o. male who presents to the Emergency Department complaining of shortness of breath, worse tonight while sitting. He reports onset 3 weeks ago, worse with activity. He states symptoms usually resolve spontaneously with rest. He reports an echocardiogram 6 days ago that showed decreased ejection volume. He reports associated, shaking, chills, rhinorrhea, and cough that produces a yellow phlegm. Family member noted slurred speech for the past few hours. States his last normal yesterday when he spoke on the phone. He denies swelling or pain in legs. He states he is not on any fluid pills or breathing treatment at home. He states he does not currently feel short of breath. Denies chest pain at any point.  Past Medical History  Diagnosis Date  . Essential hypertension, benign   . Cerebrovascular accident     Right subcortical infarct 2006  . Cataract   . Macular degeneration     Dr. [name]  . Coronary atherosclerosis of native coronary artery     Multivessel, LVEF approximately 30%  . Hyperlipidemia   . Varicose veins   . Diabetes mellitus, type 2   . Non-ST elevation MI (NSTEMI)     2005   Past Surgical History  Procedure Laterality Date  . Coronary artery bypass graft  2005    LIMA to LAD, SVG diagonal, SVG to PDA and PLA   History reviewed. No pertinent family history. History  Substance Use Topics  . Smoking status: Never Smoker   . Smokeless tobacco: Not on file  . Alcohol Use: No    Review of Systems  Constitutional: Negative for  fever, chills, diaphoresis and fatigue.  Respiratory: Positive for cough, shortness of breath and wheezing.   Cardiovascular: Negative for chest pain, palpitations and leg swelling.  Gastrointestinal: Negative for nausea, vomiting, abdominal pain and diarrhea.  Musculoskeletal: Negative for back pain, neck pain and neck stiffness.  Skin: Negative for rash and wound.  Neurological: Positive for speech difficulty. Negative for dizziness, syncope, weakness, light-headedness and headaches.  All other systems reviewed and are negative.     Allergies  Tetracycline  Home Medications   Prior to Admission medications   Medication Sig Start Date End Date Taking? Authorizing Provider  ALPRAZolam (XANAX) 0.25 MG tablet Take 0.25 mg by mouth 2 (two) times daily as needed.  02/28/12   Historical Provider, MD  amLODipine-olmesartan (AZOR) 5-40 MG per tablet Take 1 tablet by mouth daily.    Historical Provider, MD  aspirin 81 MG tablet Take 81 mg by mouth daily.    Historical Provider, MD  carvedilol (COREG) 3.125 MG tablet Take 1 tablet (3.125 mg total) by mouth 2 (two) times daily. 11/20/13   Satira Sark, MD  Cyanocobalamin (VITAMIN B-12) 1000 MCG SUBL Place under the tongue daily.    Historical Provider, MD  GLIPIZIDE XL 10 MG 24 hr tablet Take 10 mg by mouth 2 (two) times daily.  02/10/12   Historical Provider, MD  LevOCARNitine (L-CARNITINE) 500 MG TABS Take  2 tablets by mouth daily.    Historical Provider, MD  levothyroxine (SYNTHROID, LEVOTHROID) 50 MCG tablet Take 50 mcg by mouth daily.    Historical Provider, MD  Lutein 40 MG CAPS Take 40 mg by mouth daily.    Historical Provider, MD  metFORMIN (GLUCOPHAGE) 500 MG tablet Take 500 mg by mouth daily with breakfast.    Historical Provider, MD  Multiple Vitamins-Minerals (ICAPS) TABS Take 1 tablet by mouth daily.    Historical Provider, MD  Omega-3 Fatty Acids (FISH OIL) 1000 MG CAPS Take 1 capsule by mouth 2 (two) times daily.    Historical  Provider, MD  rosuvastatin (CRESTOR) 20 MG tablet Take 1 tablet (20 mg total) by mouth daily. 03/13/12   Burna Forts Serpe, PA-C   BP 152/96  Pulse 75  Temp(Src) 97.7 F (36.5 C) (Oral)  Resp 24  Ht 5\' 8"  (1.727 m)  Wt 167 lb (75.751 kg)  BMI 25.40 kg/m2  SpO2 93% Physical Exam  Nursing note and vitals reviewed. Constitutional: He is oriented to person, place, and time. He appears well-developed and well-nourished. No distress.  HENT:  Head: Normocephalic and atraumatic.  Mouth/Throat: Oropharynx is clear and moist.  Eyes: EOM are normal. Pupils are equal, round, and reactive to light.  Neck: Normal range of motion. Neck supple.  Cardiovascular: Normal rate and regular rhythm.   Pulmonary/Chest: Effort normal. No respiratory distress. He has wheezes (expiratory wheezing throughout.). He has no rales.  Abdominal: Soft. Bowel sounds are normal. He exhibits no distension and no mass. There is no tenderness. There is no rebound and no guarding.  Musculoskeletal: Normal range of motion. He exhibits no edema and no tenderness.  No calf swelling or tenderness.  Neurological: He is alert and oriented to person, place, and time.  Patient is alert and oriented x3 with clear, goal oriented speech. Patient has 5/5 motor in all extremities. Sensation is intact to light touch. Bilateral finger-to-nose is normal with no signs of dysmetria.   Skin: Skin is warm and dry. No rash noted. No erythema.  Psychiatric: He has a normal mood and affect. His behavior is normal.    ED Course  Procedures (including critical care time) Labs Review Labs Reviewed  CBC WITH DIFFERENTIAL  COMPREHENSIVE METABOLIC PANEL  PRO B NATRIURETIC PEPTIDE  PROTIME-INR  URINALYSIS, ROUTINE W REFLEX MICROSCOPIC  APTT    Imaging Review No results found.   EKG Interpretation None     DIAGNOSTIC STUDIES: Oxygen Saturation is 93% on room air, adequate by my interpretation.    COORDINATION OF CARE:   Date:  11/26/2013  Rate: 84  Rhythm: normal sinus rhythm  QRS Axis: normal  Intervals: QRS complex prolonged  ST/T Wave abnormalities: nonspecific T wave changes  Conduction Disutrbances:right bundle branch block  Narrative Interpretation:   Old EKG Reviewed: unchanged   MDM   Final diagnoses:  None    I personally performed the services described in this documentation, which was scribed in my presence. The recorded information has been reviewed and is accurate.     Patient is feeling much better after 2 breathing treatments. Still has very mild expiratory wheezes. He is ambulating better than he has for some time per family members. He denies any shortness of breath or chest pain. His saturations never dropped below 97% on room air while ambulating. He is anxious to be discharged home. Will follow up with his physician on Tuesday. Been given strict return precautions to return for increased shortness of  breath, fever, chest pain or for any concerns. Suspect patient likely has underlying systolic congestive heart failure with bronchitis/bronchospasm. We'll give albuterol inhaler and short course of prednisone.  Clayton Rice, MD 11/26/13 [phone]

## 2013-11-26 NOTE — Discharge Instructions (Signed)
Bronchospasm °A bronchospasm is a spasm or tightening of the airways going into the lungs. During a bronchospasm breathing becomes more difficult because the airways get smaller. When this happens there can be coughing, a whistling sound when breathing (wheezing), and difficulty breathing. Bronchospasm is often associated with asthma, but not all patients who experience a bronchospasm have asthma. °CAUSES  °A bronchospasm is caused by inflammation or irritation of the airways. The inflammation or irritation may be triggered by:  °· Allergies (such as to animals, pollen, food, or mold). Allergens that cause bronchospasm may cause wheezing immediately after exposure or many hours later.   °· Infection. Viral infections are believed to be the most common cause of bronchospasm.   °· Exercise.   °· Irritants (such as pollution, cigarette smoke, strong odors, aerosol sprays, and paint fumes).   °· Weather changes. Winds increase molds and pollens in the air. Rain refreshes the air by washing irritants out. Cold air may cause inflammation.   °· Stress and emotional upset.   °SIGNS AND SYMPTOMS  °· Wheezing.   °· Excessive nighttime coughing.   °· Frequent or severe coughing with a simple cold.   °· Chest tightness.   °· Shortness of breath.   °DIAGNOSIS  °Bronchospasm is usually diagnosed through a history and physical exam. Tests, such as chest X-rays, are sometimes done to look for other conditions. °TREATMENT  °· Inhaled medicines can be given to open up your airways and help you breathe. The medicines can be given using either an inhaler or a nebulizer machine. °· Corticosteroid medicines may be given for severe bronchospasm, usually when it is associated with asthma. °HOME CARE INSTRUCTIONS  °· Always have a plan prepared for seeking medical care. Know when to call your health care provider and local emergency services (911 in the [place] Know where you can access local emergency care. °· Only take medicines as  directed by your health care provider. °· If you were prescribed an inhaler or nebulizer machine, ask your health care provider to explain how to use it correctly. Always use a spacer with your inhaler if you were given one. °· It is necessary to remain calm during an attack. Try to relax and breathe more slowly.  °· Control your home environment in the following ways:   °¨ Change your heating and air conditioning filter at least once a month.   °¨ Limit your use of fireplaces and wood stoves. °¨ Do not smoke and do not allow smoking in your home.   °¨ Avoid exposure to perfumes and fragrances.   °¨ Get rid of pests (such as roaches and mice) and their droppings.   °¨ Throw away plants if you see mold on them.   °¨ Keep your house clean and dust free.   °¨ Replace carpet with wood, tile, or vinyl flooring. Carpet can trap dander and dust.   °¨ Use allergy-proof pillows, mattress covers, and box spring covers.   °¨ Wash bed sheets and blankets every week in hot water and dry them in a dryer.   °¨ Use blankets that are made of polyester or cotton.   °¨ Wash hands frequently. °SEEK MEDICAL CARE IF:  °· You have muscle aches.   °· You have chest pain.   °· The sputum changes from clear or white to yellow, green, gray, or bloody.   °· The sputum you cough up gets thicker.   °· There are problems that may be related to the medicine you are given, such as a rash, itching, swelling, or trouble breathing.   °SEEK IMMEDIATE MEDICAL CARE IF:  °· You have worsening wheezing and coughing even   after taking your prescribed medicines.   °· You have increased difficulty breathing.   °· You develop severe chest pain. °MAKE SURE YOU:  °· Understand these instructions. °· Will watch your condition. °· Will get help right away if you are not doing well or get worse. °Document Released: 03/11/2003 Document Revised: 03/13/2013 Document Reviewed: 08/28/2012 °[employer]® Patient Information ©2015 [employer]. This information is not  intended to replace advice given to you by your health care provider. Make sure you discuss any questions you have with your health care provider. ° °

## 2013-11-27 ENCOUNTER — Telehealth: Payer: Self-pay | Admitting: *Deleted

## 2013-11-27 ENCOUNTER — Encounter (HOSPITAL_COMMUNITY): Payer: Medicare Other

## 2013-11-27 ENCOUNTER — Encounter: Payer: Self-pay | Admitting: Cardiology

## 2013-11-27 ENCOUNTER — Encounter (HOSPITAL_COMMUNITY): Payer: Self-pay

## 2013-11-27 ENCOUNTER — Ambulatory Visit (INDEPENDENT_AMBULATORY_CARE_PROVIDER_SITE_OTHER): Payer: Medicare Other | Admitting: Cardiology

## 2013-11-27 ENCOUNTER — Other Ambulatory Visit: Payer: Self-pay | Admitting: Cardiology

## 2013-11-27 ENCOUNTER — Ambulatory Visit (HOSPITAL_COMMUNITY): Admission: RE | Admit: 2013-11-27 | Payer: Medicare Other | Source: Ambulatory Visit

## 2013-11-27 VITALS — BP 142/92 | HR 87 | Ht 68.0 in | Wt 168.0 lb

## 2013-11-27 DIAGNOSIS — N189 Chronic kidney disease, unspecified: Secondary | ICD-10-CM

## 2013-11-27 DIAGNOSIS — I5023 Acute on chronic systolic (congestive) heart failure: Secondary | ICD-10-CM

## 2013-11-27 DIAGNOSIS — I2589 Other forms of chronic ischemic heart disease: Secondary | ICD-10-CM

## 2013-11-27 DIAGNOSIS — I5043 Acute on chronic combined systolic (congestive) and diastolic (congestive) heart failure: Secondary | ICD-10-CM | POA: Insufficient documentation

## 2013-11-27 DIAGNOSIS — N289 Disorder of kidney and ureter, unspecified: Secondary | ICD-10-CM

## 2013-11-27 DIAGNOSIS — I2510 Atherosclerotic heart disease of native coronary artery without angina pectoris: Secondary | ICD-10-CM

## 2013-11-27 DIAGNOSIS — I10 Essential (primary) hypertension: Secondary | ICD-10-CM

## 2013-11-27 DIAGNOSIS — E785 Hyperlipidemia, unspecified: Secondary | ICD-10-CM

## 2013-11-27 NOTE — Assessment & Plan Note (Signed)
No further adjustments made in medical therapy today.

## 2013-11-27 NOTE — Progress Notes (Signed)
Clinical Summary Clayton Morton is a 80 y.o.male seen recently in the office on September 1. He was reporting worsening shortness of breath at that time, had had a possible "lung infection" back in the spring, although just has not seemed to bounce back. He denied any chest pain at that time, reports compliance with his medications.  I referred him for an echocardiogram and Cardiolite study to follow. He has a known history of ischemic cardiomyopathy and we have managed medically, he has preferred a fairly conservative approach. Echocardiogram from September 4 reported moderate LVH with LVEF less than 10%, diffuse hypokinesis, evidence of increased left atrial pressures, mild aortic stenosis, severe left atrial enlargement, mild mitral regurgitation, moderate RV dilatation with severely reduced contraction, PASP 62 mmHg. This represents substantial deterioration from prior assessment, so I had him come into the office to discuss the results. The Cardiolite was cancelled by Dr. Harl Bowie when the echocardiogram was interpreted.  I see he had a recent ER visit on September 7 with shortness of breath, seemed to improve with breathing treatments at that time. Chest x-ray reported cardiac enlargement with no active disease, no edema or consolidation. Pro-BNP was 20,669, additional lab work showed potassium 3.9, BUN 28, creatinine 1.4, AST 11, ALT 17, hemoglobin 12.7, platelets 198, INR 1.1.  He is here with his wife and son (a Theme park manager) today. He is on a steroid taper, feels better in general, but is still weak and short of breath with activity. I discussed the results of his echocardiogram and the implications. Although he declined ICD therapy in the past, he is amenable to pursuing further workup of his cardiomyopathy, we specifically discussed a heart catheterization today. Question is whether he has advancing ischemic heart disease with graft disease, whether in that case he has any potential revascularization  options, or perhaps whether this is a nonischemic cardiomyopathy that is possibly viral in nature following his "lung infection" back in the spring. In either case, he is clearly symptomatic, and we need to know what his options are beyond standard medical therapy.   Allergies  Allergen Reactions  . Tetracycline     Current Outpatient Prescriptions  Medication Sig Dispense Refill  . albuterol (PROVENTIL HFA;VENTOLIN HFA) 108 (90 BASE) MCG/ACT inhaler Inhale 1-2 puffs into the lungs every 6 (six) hours as needed for wheezing or shortness of breath.  1 Inhaler  0  . ALPRAZolam (XANAX) 0.25 MG tablet Take 0.25 mg by mouth 2 (two) times daily as needed.       [name] amLODipine-olmesartan (AZOR) 5-40 MG per tablet Take 1 tablet by mouth daily.      [name] aspirin 81 MG tablet Take 81 mg by mouth daily.      . carvedilol (COREG) 3.125 MG tablet Take 1 tablet (3.125 mg total) by mouth 2 (two) times daily.  180 tablet  1  . Cyanocobalamin (VITAMIN B-12) 1000 MCG SUBL Place under the tongue daily.      [name] GLIPIZIDE XL 10 MG 24 hr tablet Take 10 mg by mouth 2 (two) times daily.       . LevOCARNitine (L-CARNITINE) 500 MG TABS Take 2 tablets by mouth daily.      [name] levothyroxine (SYNTHROID, LEVOTHROID) 50 MCG tablet Take 50 mcg by mouth daily.      . Lutein 40 MG CAPS Take 40 mg by mouth daily.      . metFORMIN (GLUCOPHAGE) 500 MG tablet Take 500 mg by mouth daily with breakfast.      .  Multiple Vitamins-Minerals (ICAPS) TABS Take 1 tablet by mouth daily.      . Omega-3 Fatty Acids (FISH OIL) 1000 MG CAPS Take 1 capsule by mouth 2 (two) times daily.      . predniSONE (DELTASONE) 50 MG tablet Take 1 tablet (50 mg total) by mouth daily.  5 tablet  0  . rosuvastatin (CRESTOR) 20 MG tablet Take 1 tablet (20 mg total) by mouth daily.  90 tablet  3   No current facility-administered medications for this visit.    Past Medical History  Diagnosis Date  . Essential hypertension, benign   . Cerebrovascular accident       Right subcortical infarct 2006  . Cataract   . Macular degeneration     Clayton Morton  . Coronary atherosclerosis of native coronary artery     Multivessel, LVEF approximately 30%  . Hyperlipidemia   . Varicose veins   . Diabetes mellitus, type 2   . Non-ST elevation MI (NSTEMI)     2005    Past Surgical History  Procedure Laterality Date  . Coronary artery bypass graft  2005    LIMA to LAD, SVG diagonal, SVG to PDA and PLA    Family History  Problem Relation Age of Onset  . Hypertension      Social History [name] reports that he has never smoked. He has never used smokeless tobacco. [name] reports that he does not drink alcohol.  Review of Systems No palpitations or syncope. No claudication. No productive cough, no fevers or chills. Mild sore throat. Other systems reviewed and negative.  Physical Examination Filed Vitals:   11/27/13 1316  BP: 142/92  Pulse: 87   Filed Weights   11/27/13 1316  Weight: 168 lb (76.204 kg)    Appears comfortable at rest.  HEENT: Conjunctiva and lids normal, oropharynx clear.  Neck: Supple, elevated JVP, no carotid bruits, no thyromegaly.  Lungs: Coarse but clear to auscultation, nonlabored breathing at rest.  Cardiac: Regular rate and rhythm, indistinct PMI. Possible soft S3.  Abdomen: Soft, nontender, bowel sounds present, no guarding or rebound.  Extremities: Trace edema, distal pulses 2+.  Skin: Warm and dry.  Musculoskeletal: No kyphosis.  Neuropsychiatric: Alert and oriented x3, affect grossly appropriate.   Problem List and Plan   Acute on chronic systolic heart failure He feels somewhat better today, although is on a steroid taper which may be giving him a false sense of euphoria. Remains short of breath with activity. There has been a clear decline in LVEF, now approximately 10% with biventricular failure and evidence of pulmonary hypertension. I discussed this with the patient and his family today, plan is to  proceed with a left and right heart catheterization with Dr. Burt Knack on Thursday morning. This will allow definition of his coronary and bypass graft anatomy, also hemodynamic assessment. If there are revascularization options, perhaps this might lead to some improvement in LVEF. If not, we will need to determine what medical therapy adjustments might be advantageous, and in fact he may need to be admitted to the hospital if his hemodynamics would suggest improvement from further IV diuresis for inotropes.  Coronary atherosclerosis of native coronary artery Multivessel CAD status post CABG 2005 as outlined above.  ISCHEMIC CARDIOMYOPATHY Prior LVEF approximately 30-35%. He had declined ICD in the past.  HYPERTENSION No further adjustments made in medical therapy today.  Acute on chronic renal insufficiency Recent creatinine 1.4. Would limit contrast dye as able.  HYPERLIPIDEMIA Continues on Crestor.  Satira Sark, M.D., F.A.C.C.

## 2013-11-27 NOTE — Assessment & Plan Note (Signed)
He feels somewhat better today, although is on a steroid taper which may be giving him a false sense of euphoria. Remains short of breath with activity. There has been a clear decline in LVEF, now approximately 10% with biventricular failure and evidence of pulmonary hypertension. I discussed this with the patient and his family today, plan is to proceed with a left and right heart catheterization with Dr. Burt Knack on Thursday morning. This will allow definition of his coronary and bypass graft anatomy, also hemodynamic assessment. If there are revascularization options, perhaps this might lead to some improvement in LVEF. If not, we will need to determine what medical therapy adjustments might be advantageous, and in fact he may need to be admitted to the hospital if his hemodynamics would suggest improvement from further IV diuresis for inotropes.

## 2013-11-27 NOTE — Assessment & Plan Note (Signed)
Continues on Crestor.

## 2013-11-27 NOTE — Assessment & Plan Note (Signed)
Recent creatinine 1.4. Would limit contrast dye as able.

## 2013-11-27 NOTE — Telephone Encounter (Signed)
Wife informed and said that patient is feeling much better today. Patient denies chest pain, dizziness or sob.

## 2013-11-27 NOTE — Telephone Encounter (Addendum)
Patient's wife informed and will see Domenic Polite today at 1:20 pm at the [place] office.

## 2013-11-27 NOTE — Assessment & Plan Note (Signed)
Prior LVEF approximately 30-35%. He had declined ICD in the past.

## 2013-11-27 NOTE — Telephone Encounter (Signed)
Please see if [name] and his wife would be available to come into the office in [place] this afternoon for me to discuss the results of his echocardiogram, and the next step in his management. If they agree, please call the [place] office to schedule a visit in the early afternoon.

## 2013-11-27 NOTE — Telephone Encounter (Signed)
Patient is feeling better today. No chest pain, dizziness or sob per wife. Patient is wanting to know what the plan is after having the echo.

## 2013-11-27 NOTE — Assessment & Plan Note (Signed)
Multivessel CAD status post CABG 2005 as outlined above.

## 2013-11-27 NOTE — Telephone Encounter (Signed)
Message copied by Merlene Laughter on Tue Nov 27, 2013 11:14 AM ------      Message from: Satira Sark      Created: Tue Nov 27, 2013  8:46 AM       Results noted. There has been significant decline in LVEF since last assessment. I see that he was in the ER over the weekend with further shortness of breath. Please call patient and see how he is doing. I just talked with him last week, if his symptoms are continuing to worsen, he should probably be admitted to the hospital for further inpatient management and possible heart catheterization, if he is agreeable. ------

## 2013-11-27 NOTE — Patient Instructions (Signed)
Your physician has requested that you have a cardiac catheterization. Cardiac catheterization is used to diagnose and/or treat various heart conditions. Doctors may recommend this procedure for a number of different reasons. The most common reason is to evaluate chest pain. Chest pain can be a symptom of coronary artery disease (CAD), and cardiac catheterization can show whether plaque is narrowing or blocking your heart's arteries. This procedure is also used to evaluate the valves, as well as measure the blood flow and oxygen levels in different parts of your heart. For further information please visit [email or web address]. Please follow instruction sheet, as given.  Thank you for choosing [facility] !

## 2013-11-27 NOTE — Telephone Encounter (Signed)
Result note reviewed.

## 2013-11-29 ENCOUNTER — Encounter (HOSPITAL_COMMUNITY)
Admission: RE | Disposition: A | Discharge status: Home / Self Care | Payer: Self-pay | Source: Ambulatory Visit | Attending: Internal Medicine

## 2013-11-29 ENCOUNTER — Inpatient Hospital Stay (HOSPITAL_COMMUNITY)
Admission: RE | Admit: 2013-11-29 | Discharge: 2013-12-03 | DRG: 287 | Disposition: A | Discharge status: Home / Self Care | Payer: Medicare Other | Source: Ambulatory Visit | Attending: Internal Medicine | Admitting: Internal Medicine

## 2013-11-29 DIAGNOSIS — E876 Hypokalemia: Secondary | ICD-10-CM | POA: Diagnosis present

## 2013-11-29 DIAGNOSIS — H3530 Unspecified macular degeneration: Secondary | ICD-10-CM | POA: Diagnosis present

## 2013-11-29 DIAGNOSIS — I472 Ventricular tachycardia, unspecified: Secondary | ICD-10-CM | POA: Diagnosis present

## 2013-11-29 DIAGNOSIS — N179 Acute kidney failure, unspecified: Secondary | ICD-10-CM | POA: Diagnosis present

## 2013-11-29 DIAGNOSIS — Z8673 Personal history of transient ischemic attack (TIA), and cerebral infarction without residual deficits: Secondary | ICD-10-CM | POA: Diagnosis not present

## 2013-11-29 DIAGNOSIS — I4729 Other ventricular tachycardia: Secondary | ICD-10-CM | POA: Diagnosis present

## 2013-11-29 DIAGNOSIS — I509 Heart failure, unspecified: Secondary | ICD-10-CM | POA: Diagnosis present

## 2013-11-29 DIAGNOSIS — I25810 Atherosclerosis of coronary artery bypass graft(s) without angina pectoris: Secondary | ICD-10-CM | POA: Diagnosis present

## 2013-11-29 DIAGNOSIS — I2510 Atherosclerotic heart disease of native coronary artery without angina pectoris: Secondary | ICD-10-CM

## 2013-11-29 DIAGNOSIS — IMO0001 Reserved for inherently not codable concepts without codable children: Secondary | ICD-10-CM | POA: Diagnosis present

## 2013-11-29 DIAGNOSIS — I2582 Chronic total occlusion of coronary artery: Secondary | ICD-10-CM | POA: Diagnosis present

## 2013-11-29 DIAGNOSIS — I080 Rheumatic disorders of both mitral and aortic valves: Secondary | ICD-10-CM | POA: Diagnosis present

## 2013-11-29 DIAGNOSIS — E1165 Type 2 diabetes mellitus with hyperglycemia: Secondary | ICD-10-CM

## 2013-11-29 DIAGNOSIS — I5043 Acute on chronic combined systolic (congestive) and diastolic (congestive) heart failure: Secondary | ICD-10-CM | POA: Diagnosis present

## 2013-11-29 DIAGNOSIS — I2589 Other forms of chronic ischemic heart disease: Secondary | ICD-10-CM | POA: Diagnosis present

## 2013-11-29 DIAGNOSIS — IMO0002 Reserved for concepts with insufficient information to code with codable children: Secondary | ICD-10-CM

## 2013-11-29 DIAGNOSIS — N189 Chronic kidney disease, unspecified: Secondary | ICD-10-CM | POA: Diagnosis present

## 2013-11-29 DIAGNOSIS — E785 Hyperlipidemia, unspecified: Secondary | ICD-10-CM | POA: Diagnosis present

## 2013-11-29 DIAGNOSIS — Z7982 Long term (current) use of aspirin: Secondary | ICD-10-CM | POA: Diagnosis not present

## 2013-11-29 DIAGNOSIS — Z79899 Other long term (current) drug therapy: Secondary | ICD-10-CM | POA: Diagnosis not present

## 2013-11-29 DIAGNOSIS — I252 Old myocardial infarction: Secondary | ICD-10-CM

## 2013-11-29 DIAGNOSIS — I129 Hypertensive chronic kidney disease with stage 1 through stage 4 chronic kidney disease, or unspecified chronic kidney disease: Secondary | ICD-10-CM | POA: Diagnosis present

## 2013-11-29 DIAGNOSIS — N289 Disorder of kidney and ureter, unspecified: Secondary | ICD-10-CM

## 2013-11-29 DIAGNOSIS — I5023 Acute on chronic systolic (congestive) heart failure: Principal | ICD-10-CM | POA: Diagnosis present

## 2013-11-29 DIAGNOSIS — I493 Ventricular premature depolarization: Secondary | ICD-10-CM

## 2013-11-29 HISTORY — DX: Ischemic cardiomyopathy: I25.5

## 2013-11-29 HISTORY — DX: Chronic systolic (congestive) heart failure: I50.22

## 2013-11-29 HISTORY — PX: LEFT AND RIGHT HEART CATHETERIZATION WITH CORONARY/GRAFT ANGIOGRAM: SHX5448

## 2013-11-29 LAB — POCT I-STAT 3, VENOUS BLOOD GAS (G3P V)
Bicarbonate: 25.4 mEq/L — ABNORMAL HIGH (ref 20.0–24.0)
Bicarbonate: 25.8 mEq/L — ABNORMAL HIGH (ref 20.0–24.0)
O2 Saturation: 49 %
O2 Saturation: 52 %
TCO2: 27 mmol/L (ref 0–100)
TCO2: 27 mmol/L (ref 0–100)
pCO2, Ven: 42.7 mmHg — ABNORMAL LOW (ref 45.0–50.0)
pCO2, Ven: 44.5 mmHg — ABNORMAL LOW (ref 45.0–50.0)
pH, Ven: 7.372 — ABNORMAL HIGH (ref 7.250–7.300)
pH, Ven: 7.383 — ABNORMAL HIGH (ref 7.250–7.300)
pO2, Ven: 27 mmHg — CL (ref 30.0–45.0)
pO2, Ven: 29 mmHg — CL (ref 30.0–45.0)

## 2013-11-29 LAB — CBC
HCT: 38.5 % — ABNORMAL LOW (ref 39.0–52.0)
Hemoglobin: 12.9 g/dL — ABNORMAL LOW (ref 13.0–17.0)
MCH: 30.6 pg (ref 26.0–34.0)
MCHC: 33.5 g/dL (ref 30.0–36.0)
MCV: 91.2 fL (ref 78.0–100.0)
Platelets: 210 10*3/uL (ref 150–400)
RBC: 4.22 MIL/uL (ref 4.22–5.81)
RDW: 14.5 % (ref 11.5–15.5)
WBC: 9.7 10*3/uL (ref 4.0–10.5)

## 2013-11-29 LAB — POCT I-STAT 3, ART BLOOD GAS (G3+)
Acid-base deficit: 1 mmol/L (ref 0.0–2.0)
Bicarbonate: 21.7 mEq/L (ref 20.0–24.0)
O2 Saturation: 94 %
TCO2: 23 mmol/L (ref 0–100)
pCO2 arterial: 31 mmHg — ABNORMAL LOW (ref 35.0–45.0)
pH, Arterial: 7.453 — ABNORMAL HIGH (ref 7.350–7.450)
pO2, Arterial: 66 mmHg — ABNORMAL LOW (ref 80.0–100.0)

## 2013-11-29 LAB — GLUCOSE, CAPILLARY
Glucose-Capillary: 216 mg/dL — ABNORMAL HIGH (ref 70–99)
Glucose-Capillary: 262 mg/dL — ABNORMAL HIGH (ref 70–99)
Glucose-Capillary: 297 mg/dL — ABNORMAL HIGH (ref 70–99)

## 2013-11-29 LAB — CREATININE, SERUM
Creatinine, Ser: 1.12 mg/dL (ref 0.50–1.35)
GFR calc Af Amer: 71 mL/min — ABNORMAL LOW (ref >90)
GFR calc non Af Amer: 61 mL/min — ABNORMAL LOW (ref >90)

## 2013-11-29 LAB — MRSA PCR SCREENING: MRSA by PCR: NEGATIVE

## 2013-11-29 SURGERY — LEFT AND RIGHT HEART CATHETERIZATION WITH CORONARY/GRAFT ANGIOGRAM
Anesthesia: LOCAL

## 2013-11-29 MED ORDER — INSULIN ASPART 100 UNIT/ML ~~LOC~~ SOLN: 0.0000 [IU] | Freq: Three times a day (TID) | SUBCUTANEOUS | Status: DC

## 2013-11-29 MED ORDER — POTASSIUM CHLORIDE CRYS ER 10 MEQ PO TBCR
10.0000 meq | EXTENDED_RELEASE_TABLET | Freq: Two times a day (BID) | ORAL | Status: DC
  Administered 2013-11-29 – 2013-12-03 (×8): 10 meq via ORAL
  Filled 2013-11-29 (×11): qty 1

## 2013-11-29 MED ORDER — HYDRALAZINE HCL 20 MG/ML IJ SOLN
INTRAMUSCULAR | Status: AC
  Filled 2013-11-29: qty 1

## 2013-11-29 MED ORDER — INSULIN ASPART 100 UNIT/ML ~~LOC~~ SOLN
0.0000 [IU] | Freq: Every day | SUBCUTANEOUS | Status: DC
  Administered 2013-11-29 – 2013-12-02 (×2): 3 [IU] via SUBCUTANEOUS

## 2013-11-29 MED ORDER — SODIUM CHLORIDE 0.9 % IJ SOLN: 3.0000 mL | Freq: Two times a day (BID) | INTRAMUSCULAR | Status: DC

## 2013-11-29 MED ORDER — ALBUTEROL SULFATE HFA 108 (90 BASE) MCG/ACT IN AERS: 1.0000 | INHALATION_SPRAY | Freq: Four times a day (QID) | RESPIRATORY_TRACT | Status: DC | PRN

## 2013-11-29 MED ORDER — HEPARIN (PORCINE) IN NACL 2-0.9 UNIT/ML-% IJ SOLN
INTRAMUSCULAR | Status: AC
  Filled 2013-11-29: qty 500

## 2013-11-29 MED ORDER — SODIUM CHLORIDE 0.9 % IV SOLN: 250.0000 mL | INTRAVENOUS | Status: DC | PRN

## 2013-11-29 MED ORDER — SODIUM CHLORIDE 0.9 % IV SOLN
INTRAVENOUS | Status: DC
  Administered 2013-11-29: 50 mL/h via INTRAVENOUS

## 2013-11-29 MED ORDER — FUROSEMIDE 10 MG/ML IJ SOLN
80.0000 mg | Freq: Two times a day (BID) | INTRAMUSCULAR | Status: DC
  Administered 2013-11-29 – 2013-12-01 (×4): 80 mg via INTRAVENOUS
  Filled 2013-11-29 (×7): qty 8

## 2013-11-29 MED ORDER — FENTANYL CITRATE 0.05 MG/ML IJ SOLN
INTRAMUSCULAR | Status: AC
  Filled 2013-11-29: qty 2

## 2013-11-29 MED ORDER — ASPIRIN 81 MG PO CHEW
81.0000 mg | CHEWABLE_TABLET | ORAL | Status: AC
  Administered 2013-11-29: 81 mg via ORAL

## 2013-11-29 MED ORDER — HEPARIN SODIUM (PORCINE) 5000 UNIT/ML IJ SOLN
5000.0000 [IU] | Freq: Three times a day (TID) | INTRAMUSCULAR | Status: DC
  Administered 2013-11-29 – 2013-12-02 (×10): 5000 [IU] via SUBCUTANEOUS
  Filled 2013-11-29 (×14): qty 1

## 2013-11-29 MED ORDER — INSULIN ASPART 100 UNIT/ML ~~LOC~~ SOLN
0.0000 [IU] | Freq: Three times a day (TID) | SUBCUTANEOUS | Status: DC
  Administered 2013-11-29: 8 [IU] via SUBCUTANEOUS
  Administered 2013-11-30 – 2013-12-01 (×3): 3 [IU] via SUBCUTANEOUS
  Administered 2013-12-01: 5 [IU] via SUBCUTANEOUS
  Administered 2013-12-02: 8 [IU] via SUBCUTANEOUS
  Administered 2013-12-03: 5 [IU] via SUBCUTANEOUS

## 2013-11-29 MED ORDER — ALBUTEROL SULFATE (2.5 MG/3ML) 0.083% IN NEBU: 2.5000 mg | INHALATION_SOLUTION | Freq: Four times a day (QID) | RESPIRATORY_TRACT | Status: DC | PRN

## 2013-11-29 MED ORDER — IRBESARTAN 150 MG PO TABS
150.0000 mg | ORAL_TABLET | Freq: Every day | ORAL | Status: DC
  Administered 2013-11-29 – 2013-12-02 (×4): 150 mg via ORAL
  Filled 2013-11-29 (×5): qty 1

## 2013-11-29 MED ORDER — GLIPIZIDE ER 10 MG PO TB24
10.0000 mg | ORAL_TABLET | Freq: Two times a day (BID) | ORAL | Status: DC
  Administered 2013-11-30 – 2013-12-03 (×7): 10 mg via ORAL
  Filled 2013-11-29 (×9): qty 1

## 2013-11-29 MED ORDER — LIDOCAINE HCL (PF) 1 % IJ SOLN
INTRAMUSCULAR | Status: AC
  Filled 2013-11-29: qty 30

## 2013-11-29 MED ORDER — ACETAMINOPHEN 325 MG PO TABS
650.0000 mg | ORAL_TABLET | ORAL | Status: DC | PRN
  Administered 2013-11-30 – 2013-12-02 (×4): 650 mg via ORAL
  Filled 2013-11-29 (×4): qty 2

## 2013-11-29 MED ORDER — SODIUM CHLORIDE 0.9 % IJ SOLN: 3.0000 mL | INTRAMUSCULAR | Status: DC | PRN

## 2013-11-29 MED ORDER — LEVOTHYROXINE SODIUM 50 MCG PO TABS
50.0000 ug | ORAL_TABLET | Freq: Every day | ORAL | Status: DC
  Administered 2013-11-30 – 2013-12-03 (×4): 50 ug via ORAL
  Filled 2013-11-29 (×5): qty 1

## 2013-11-29 MED ORDER — ASPIRIN 81 MG PO CHEW
CHEWABLE_TABLET | ORAL | Status: AC
  Filled 2013-11-29: qty 1

## 2013-11-29 MED ORDER — HYDRALAZINE HCL 10 MG PO TABS
10.0000 mg | ORAL_TABLET | Freq: Four times a day (QID) | ORAL | Status: DC
  Administered 2013-11-29 – 2013-11-30 (×3): 10 mg via ORAL
  Filled 2013-11-29 (×7): qty 1

## 2013-11-29 MED ORDER — ALPRAZOLAM 0.25 MG PO TABS
0.1250 mg | ORAL_TABLET | Freq: Every morning | ORAL | Status: DC
  Administered 2013-11-30 – 2013-12-03 (×4): 0.125 mg via ORAL
  Filled 2013-11-29 (×5): qty 1

## 2013-11-29 MED ORDER — MIDAZOLAM HCL 2 MG/2ML IJ SOLN
INTRAMUSCULAR | Status: AC
  Filled 2013-11-29: qty 2

## 2013-11-29 MED ORDER — ISOSORBIDE MONONITRATE ER 30 MG PO TB24
30.0000 mg | ORAL_TABLET | Freq: Every day | ORAL | Status: DC
  Administered 2013-11-29 – 2013-12-02 (×4): 30 mg via ORAL
  Filled 2013-11-29 (×4): qty 1

## 2013-11-29 MED ORDER — CARVEDILOL 3.125 MG PO TABS
3.1250 mg | ORAL_TABLET | Freq: Two times a day (BID) | ORAL | Status: DC
  Administered 2013-11-30 – 2013-12-03 (×7): 3.125 mg via ORAL
  Filled 2013-11-29 (×9): qty 1

## 2013-11-29 MED ORDER — ONDANSETRON HCL 4 MG/2ML IJ SOLN: 4.0000 mg | Freq: Four times a day (QID) | INTRAMUSCULAR | Status: DC | PRN

## 2013-11-29 MED ORDER — ASPIRIN 81 MG PO TABS: 81.0000 mg | ORAL_TABLET | Freq: Every day | ORAL | Status: DC

## 2013-11-29 MED ORDER — SODIUM CHLORIDE 0.9 % IJ SOLN
3.0000 mL | INTRAMUSCULAR | Status: DC | PRN
Start: 2013-11-29 — End: 2013-11-29

## 2013-11-29 MED ORDER — SODIUM CHLORIDE 0.9 % IJ SOLN
3.0000 mL | Freq: Two times a day (BID) | INTRAMUSCULAR | Status: DC
  Administered 2013-11-30: 3 mL via INTRAVENOUS
  Administered 2013-11-30: 6 mL via INTRAVENOUS
  Administered 2013-11-30 – 2013-12-03 (×6): 3 mL via INTRAVENOUS

## 2013-11-29 NOTE — H&P (Signed)
Clinical Summary  [name] is a 80 y.o.male seen recently in the office on September 1. He was reporting worsening shortness of breath at that time, had had a possible "lung infection" back in the spring, although just has not seemed to bounce back. He denied any chest pain at that time, reports compliance with his medications.  I referred him for an echocardiogram and Cardiolite study to follow. He has a known history of ischemic cardiomyopathy and we have managed medically, he has preferred a fairly conservative approach. Echocardiogram from September 4 reported moderate LVH with LVEF less than 10%, diffuse hypokinesis, evidence of increased left atrial pressures, mild aortic stenosis, severe left atrial enlargement, mild mitral regurgitation, moderate RV dilatation with severely reduced contraction, PASP 62 mmHg. This represents substantial deterioration from prior assessment, so I had him come into the office to discuss the results. The Cardiolite was cancelled by Dr. Harl Bowie when the echocardiogram was interpreted.  I see he had a recent ER visit on September 7 with shortness of breath, seemed to improve with breathing treatments at that time. Chest x-ray reported cardiac enlargement with no active disease, no edema or consolidation. Pro-BNP was 20,669, additional lab work showed potassium 3.9, BUN 28, creatinine 1.4, AST 11, ALT 17, hemoglobin 12.7, platelets 198, INR 1.1.  He is here with his wife and son (a Theme park manager) today. He is on a steroid taper, feels better in general, but is still weak and short of breath with activity. I discussed the results of his echocardiogram and the implications. Although he declined ICD therapy in the past, he is amenable to pursuing further workup of his cardiomyopathy, we specifically discussed a heart catheterization today. Question is whether he has advancing ischemic heart disease with graft disease, whether in that case he has any potential revascularization  options, or perhaps whether this is a nonischemic cardiomyopathy that is possibly viral in nature following his "lung infection" back in the spring. In either case, he is clearly symptomatic, and we need to know what his options are beyond standard medical therapy.   Allergies   Allergen  Reactions   .  Tetracycline     Current Outpatient Prescriptions   Medication  Sig  Dispense  Refill   .  albuterol (PROVENTIL HFA;VENTOLIN HFA) 108 (90 BASE) MCG/ACT inhaler  Inhale 1-2 puffs into the lungs every 6 (six) hours as needed for wheezing or shortness of breath.  1 Inhaler  0   .  ALPRAZolam (XANAX) 0.25 MG tablet  Take 0.25 mg by mouth 2 (two) times daily as needed.     [name]  amLODipine-olmesartan (AZOR) 5-40 MG per tablet  Take 1 tablet by mouth daily.     [name]  aspirin 81 MG tablet  Take 81 mg by mouth daily.     .  carvedilol (COREG) 3.125 MG tablet  Take 1 tablet (3.125 mg total) by mouth 2 (two) times daily.  180 tablet  1   .  Cyanocobalamin (VITAMIN B-12) 1000 MCG SUBL  Place under the tongue daily.     [name]  GLIPIZIDE XL 10 MG 24 hr tablet  Take 10 mg by mouth 2 (two) times daily.     .  LevOCARNitine (L-CARNITINE) 500 MG TABS  Take 2 tablets by mouth daily.     [name]  levothyroxine (SYNTHROID, LEVOTHROID) 50 MCG tablet  Take 50 mcg by mouth daily.     .  Lutein 40 MG CAPS  Take 40 mg by mouth daily.     [name]  metFORMIN (GLUCOPHAGE) 500 MG tablet  Take 500 mg by mouth daily with breakfast.     .  Multiple Vitamins-Minerals (ICAPS) TABS  Take 1 tablet by mouth daily.     .  Omega-3 Fatty Acids (FISH OIL) 1000 MG CAPS  Take 1 capsule by mouth 2 (two) times daily.     .  predniSONE (DELTASONE) 50 MG tablet  Take 1 tablet (50 mg total) by mouth daily.  5 tablet  0   .  rosuvastatin (CRESTOR) 20 MG tablet  Take 1 tablet (20 mg total) by mouth daily.  90 tablet  3    No current facility-administered medications for this visit.    Past Medical History   Diagnosis  Date   .  Essential hypertension, benign     .  Cerebrovascular accident      Right subcortical infarct 2006   .  Cataract    .  Macular degeneration      Dr. [name]   .  Coronary atherosclerosis of native coronary artery      Multivessel, LVEF approximately 30%   .  Hyperlipidemia    .  Varicose veins    .  Diabetes mellitus, type 2    .  Non-ST elevation MI (NSTEMI)      2005    Past Surgical History   Procedure  Laterality  Date   .  Coronary artery bypass graft   2005     LIMA to LAD, SVG diagonal, SVG to PDA and PLA    Family History   Problem  Relation  Age of Onset   .  Hypertension     Social History  Mr. Luepke reports that he has never smoked. He has never used smokeless tobacco.  Mr. Ooley reports that he does not drink alcohol.  Review of Systems  No palpitations or syncope. No claudication. No productive cough, no fevers or chills. Mild sore throat. Other systems reviewed and negative.   Physical Examination  Filed Vitals:    11/27/13 1316   BP:  142/92   Pulse:  87    Filed Weights    11/27/13 1316   Weight:  168 lb (76.204 kg)   Appears comfortable at rest.  HEENT: Conjunctiva and lids normal, oropharynx clear.  Neck: Supple, elevated JVP, no carotid bruits, no thyromegaly.  Lungs: Coarse but clear to auscultation, nonlabored breathing at rest.  Cardiac: Regular rate and rhythm, indistinct PMI. Possible soft S3.  Abdomen: Soft, nontender, bowel sounds present, no guarding or rebound.  Extremities: Trace edema, distal pulses 2+.  Skin: Warm and dry.  Musculoskeletal: No kyphosis.  Neuropsychiatric: Alert and oriented x3, affect grossly appropriate.   Problem List and Plan  Acute on chronic systolic heart failure  He feels somewhat better today, although is on a steroid taper which may be giving him a false sense of euphoria. Remains short of breath with activity. There has been a clear decline in LVEF, now approximately 10% with biventricular failure and evidence of pulmonary hypertension. I  discussed this with the patient and his family today, plan is to proceed with a left and right heart catheterization with Dr. Burt Knack on Thursday morning. This will allow definition of his coronary and bypass graft anatomy, also hemodynamic assessment. If there are revascularization options, perhaps this might lead to some improvement in LVEF. If not, we will need to determine what medical therapy adjustments might be advantageous, and in fact he may need to be admitted to  the hospital if his hemodynamics would suggest improvement from further IV diuresis for inotropes.   Coronary atherosclerosis of native coronary artery  Multivessel CAD status post CABG 2005 as outlined above.   ISCHEMIC CARDIOMYOPATHY  Prior LVEF approximately 30-35%. He had declined ICD in the past.   HYPERTENSION  No further adjustments made in medical therapy today.   Acute on chronic renal insufficiency  Recent creatinine 1.4. Would limit contrast dye as able.   HYPERLIPIDEMIA  Continues on Crestor.  Satira Sark, M.D., F.A.C.C.   Addendum 11/29/2013: Please see the cardiac catheterization note dictated today for full details. The patient's heart catheterization demonstrates extensive CAD, severely elevated intracardiac filling pressures, and low cardiac output. I discussed his case with [name]. We will admit him to the advanced heart failure service under the care of Dr. Haroldine Laws. Will plan on treating him initially with vasodilators and aggressive diuresis. Further care per the CHF team.  Sherren Mocha MD 11/29/2013 1:33 PM

## 2013-11-29 NOTE — Interval H&P Note (Signed)
History and Physical Interval Note:  11/29/2013 12:26 PM  Clayton Morton  has presented today for surgery, with the diagnosis of cp  The various methods of treatment have been discussed with the patient and family. After consideration of risks, benefits and other options for treatment, the patient has consented to  Procedure(s): LEFT AND RIGHT HEART CATHETERIZATION WITH CORONARY/GRAFT ANGIOGRAM (N/A) as a surgical intervention .  The patient's history has been reviewed, patient examined, no change in status, stable for surgery.  I have reviewed the patient's chart and labs.  Questions were answered to the patient's satisfaction.    Cath Lab Visit (complete for each Cath Lab visit)  Clinical Evaluation Leading to the Procedure:   ACS: No.  Non-ACS:    Anginal Classification: No Symptoms  Anti-ischemic medical therapy: Maximal Therapy (2 or more classes of medications)  Non-Invasive Test Results: High-risk stress test findings: cardiac mortality >3%/year  Prior CABG: Previous CABG       Sherren Mocha

## 2013-11-29 NOTE — H&P (View-Only) (Signed)
Clinical Summary Clayton Morton is a 80 y.o.male seen recently in the office on September 1. He was reporting worsening shortness of breath at that time, had had a possible "lung infection" back in the spring, although just has not seemed to bounce back. He denied any chest pain at that time, reports compliance with his medications.  I referred him for an echocardiogram and Cardiolite study to follow. He has a known history of ischemic cardiomyopathy and we have managed medically, he has preferred a fairly conservative approach. Echocardiogram from September 4 reported moderate LVH with LVEF less than 10%, diffuse hypokinesis, evidence of increased left atrial pressures, mild aortic stenosis, severe left atrial enlargement, mild mitral regurgitation, moderate RV dilatation with severely reduced contraction, PASP 62 mmHg. This represents substantial deterioration from prior assessment, so I had him come into the office to discuss the results. The Cardiolite was cancelled by Dr. Harl Bowie when the echocardiogram was interpreted.  I see he had a recent ER visit on September 7 with shortness of breath, seemed to improve with breathing treatments at that time. Chest x-ray reported cardiac enlargement with no active disease, no edema or consolidation. Pro-BNP was 20,669, additional lab work showed potassium 3.9, BUN 28, creatinine 1.4, AST 11, ALT 17, hemoglobin 12.7, platelets 198, INR 1.1.  He is here with his wife and son (a Theme park manager) today. He is on a steroid taper, feels better in general, but is still weak and short of breath with activity. I discussed the results of his echocardiogram and the implications. Although he declined ICD therapy in the past, he is amenable to pursuing further workup of his cardiomyopathy, we specifically discussed a heart catheterization today. Question is whether he has advancing ischemic heart disease with graft disease, whether in that case he has any potential revascularization  options, or perhaps whether this is a nonischemic cardiomyopathy that is possibly viral in nature following his "lung infection" back in the spring. In either case, he is clearly symptomatic, and we need to know what his options are beyond standard medical therapy.   Allergies  Allergen Reactions  . Tetracycline     Current Outpatient Prescriptions  Medication Sig Dispense Refill  . albuterol (PROVENTIL HFA;VENTOLIN HFA) 108 (90 BASE) MCG/ACT inhaler Inhale 1-2 puffs into the lungs every 6 (six) hours as needed for wheezing or shortness of breath.  1 Inhaler  0  . ALPRAZolam (XANAX) 0.25 MG tablet Take 0.25 mg by mouth 2 (two) times daily as needed.       [name] amLODipine-olmesartan (AZOR) 5-40 MG per tablet Take 1 tablet by mouth daily.      [name] aspirin 81 MG tablet Take 81 mg by mouth daily.      . carvedilol (COREG) 3.125 MG tablet Take 1 tablet (3.125 mg total) by mouth 2 (two) times daily.  180 tablet  1  . Cyanocobalamin (VITAMIN B-12) 1000 MCG SUBL Place under the tongue daily.      [name] GLIPIZIDE XL 10 MG 24 hr tablet Take 10 mg by mouth 2 (two) times daily.       . LevOCARNitine (L-CARNITINE) 500 MG TABS Take 2 tablets by mouth daily.      [name] levothyroxine (SYNTHROID, LEVOTHROID) 50 MCG tablet Take 50 mcg by mouth daily.      . Lutein 40 MG CAPS Take 40 mg by mouth daily.      . metFORMIN (GLUCOPHAGE) 500 MG tablet Take 500 mg by mouth daily with breakfast.      .  Multiple Vitamins-Minerals (ICAPS) TABS Take 1 tablet by mouth daily.      . Omega-3 Fatty Acids (FISH OIL) 1000 MG CAPS Take 1 capsule by mouth 2 (two) times daily.      . predniSONE (DELTASONE) 50 MG tablet Take 1 tablet (50 mg total) by mouth daily.  5 tablet  0  . rosuvastatin (CRESTOR) 20 MG tablet Take 1 tablet (20 mg total) by mouth daily.  90 tablet  3   No current facility-administered medications for this visit.    Past Medical History  Diagnosis Date  . Essential hypertension, benign   . Cerebrovascular accident       Right subcortical infarct 2006  . Cataract   . Macular degeneration     Dr. [name]  . Coronary atherosclerosis of native coronary artery     Multivessel, LVEF approximately 30%  . Hyperlipidemia   . Varicose veins   . Diabetes mellitus, type 2   . Non-ST elevation MI (NSTEMI)     2005    Past Surgical History  Procedure Laterality Date  . Coronary artery bypass graft  2005    LIMA to LAD, SVG diagonal, SVG to PDA and PLA    Family History  Problem Relation Age of Onset  . Hypertension      Social History Mr. Finkel reports that he has never smoked. He has never used smokeless tobacco. Mr. Dematteo reports that he does not drink alcohol.  Review of Systems No palpitations or syncope. No claudication. No productive cough, no fevers or chills. Mild sore throat. Other systems reviewed and negative.  Physical Examination Filed Vitals:   11/27/13 1316  BP: 142/92  Pulse: 87   Filed Weights   11/27/13 1316  Weight: 168 lb (76.204 kg)    Appears comfortable at rest.  HEENT: Conjunctiva and lids normal, oropharynx clear.  Neck: Supple, elevated JVP, no carotid bruits, no thyromegaly.  Lungs: Coarse but clear to auscultation, nonlabored breathing at rest.  Cardiac: Regular rate and rhythm, indistinct PMI. Possible soft S3.  Abdomen: Soft, nontender, bowel sounds present, no guarding or rebound.  Extremities: Trace edema, distal pulses 2+.  Skin: Warm and dry.  Musculoskeletal: No kyphosis.  Neuropsychiatric: Alert and oriented x3, affect grossly appropriate.   Problem List and Plan   Acute on chronic systolic heart failure He feels somewhat better today, although is on a steroid taper which may be giving him a false sense of euphoria. Remains short of breath with activity. There has been a clear decline in LVEF, now approximately 10% with biventricular failure and evidence of pulmonary hypertension. I discussed this with the patient and his family today, plan is to  proceed with a left and right heart catheterization with Dr. Burt Knack on Thursday morning. This will allow definition of his coronary and bypass graft anatomy, also hemodynamic assessment. If there are revascularization options, perhaps this might lead to some improvement in LVEF. If not, we will need to determine what medical therapy adjustments might be advantageous, and in fact he may need to be admitted to the hospital if his hemodynamics would suggest improvement from further IV diuresis for inotropes.  Coronary atherosclerosis of native coronary artery Multivessel CAD status post CABG 2005 as outlined above.  ISCHEMIC CARDIOMYOPATHY Prior LVEF approximately 30-35%. He had declined ICD in the past.  HYPERTENSION No further adjustments made in medical therapy today.  Acute on chronic renal insufficiency Recent creatinine 1.4. Would limit contrast dye as able.  HYPERLIPIDEMIA Continues on Crestor.  Satira Sark, M.D., F.A.C.C.

## 2013-11-29 NOTE — CV Procedure (Signed)
Cardiac Catheterization Procedure Note  Name: Clayton Morton MRN: [id number] DOB: 23-Jan-1933  Procedure: Right Heart Cath, Left Heart Cath, Selective Coronary Angiography, saphenous vein graft angiography, LIMA angiography  Indication: Progressive cardiomyopathy and symptomatic congestive heart failure. This is a 80 year old gentleman with known coronary artery disease and prior coronary bypass surgery in 2006. He's had progressive symptoms of shortness of breath, weakness, and fatigue. His LVEF is declining now in the range of about 10%. He was referred for right and left heart catheterization to evaluate native coronary and graft anatomy as well as intracardiac hemodynamics.   Procedural details: The right groin was prepped, draped, and anesthetized with 1% lidocaine. Using modified Seldinger technique, a 5 French sheath was introduced into the right femoral artery and a 7 French sheath was introduced into the right femoral vein. Standard Judkins catheters were used for coronary angiography, graft angiography, and recording of LVEDP. Catheter exchanges were performed over a guidewire. A Swan-Ganz catheter was used for the right heart catheterization. There were no immediate procedural complications. The patient was transferred to the post catheterization recovery area for further monitoring.  Procedural Findings: Hemodynamics:  AO 151/89 with a mean of 117 LV 151/28 Coronary capillary wedge pressure: 34 Pulmonary artery: 65/28 with a mean of 42 RV 65/19 RA mean of 15  Oxygen saturations:  Pulmonary artery 49% Aorta 94%  Cardiac output 3.25 L per minute Cardiac index 1.7 L per minute per meter square   Coronary angiography: Coronary dominance: right  Left mainstem: The left mainstem is patent. It arises from the left coronary cusp and has 80% distal vessel stenosis before it divides into the LAD and left circumflex.  Left anterior descending (LAD): The LAD is occluded after the  first diagonal branch. The ostium of the LAD has a least 80% stenosis. There is moderate calcification present. The first diagonal branch has 90% ostial stenosis. The segment of LAD between the first and second diagonal branch has 95% stenosis.  Left circumflex (LCx): The left circumflex is severely diseased. There is a small intermediate branch with 95% ostial stenosis. The proximal circumflex has 95% stenosis. The first and second OM branches are patent with diffuse disease. The mid circumflex leading into the first OM branch has 90% stenosis. The distal circumflex between the first and second OM branches have 75% stenosis.  Right coronary artery (RCA): I was unable to find the right coronary origin. I suspect the vessel has a proximal occlusion.  Saphenous vein graft to PDA and PLA is patent. The mid body the graft is 50% stenosis. The PDA portion of the graft is patent. The PL segment is occluded.  The saphenous vein graft to diagonal is patent until the coronary anastomotic site where there is 95% local stenosis. The diagonal branch is patent throughout the native vessel portion.  LIMA to LAD: Patent vessel supplying only the apical portion of the LAD.  Left ventriculography: Not performed  Final Conclusions:   1. Markedly elevated left ventricular end-diastolic pressure/PCWP and low cardiac index 2. Severe native three-vessel coronary artery disease 3. Status post aortocoronary bypass surgery with continued patency of the saphenous vein graft to PDA, occlusion of the saphenous vein graft sequence to PLA, patency of the LIMA to LAD, and patency of the saphenous vein graft to diagonal with severe stenosis at the coronary anastomotic site  Recommendations: Hospital admission to the advanced CHF team. This patient has developed very severe LV dysfunction, markedly elevated intracardiac filling pressures in the context of  low cardiac output, and progressive coronary artery disease. After he is  better compensated with respect to his heart failure, would consider PCI of the saphenous vein graft to diagonal. I think his native vessel disease is so diffuse and extensive, that we likely will not be able to help him with PCI of the left circumflex disease.  Sherren Mocha MD 11/29/2013, 1:14 PM

## 2013-11-29 NOTE — Progress Notes (Signed)
Site:rt groin Site prior to removal: Level  0 Pressure applied for:30   Manual:yes, venous then arterial Patient status during pull:stable Post pull site: Level 0  Post pull instructions given:yes Post pull pulses present:doppler  Dressing applied:tegaderm Bedrest begins @1300  Comments:

## 2013-11-29 NOTE — Progress Notes (Signed)
Report to relief: [name] assuming care of patient.

## 2013-11-30 ENCOUNTER — Encounter (HOSPITAL_COMMUNITY): Payer: Self-pay | Admitting: *Deleted

## 2013-11-30 DIAGNOSIS — I5023 Acute on chronic systolic (congestive) heart failure: Principal | ICD-10-CM

## 2013-11-30 LAB — BASIC METABOLIC PANEL
Anion gap: 14 (ref 5–15)
BUN: 33 mg/dL — ABNORMAL HIGH (ref 6–23)
CO2: 25 mEq/L (ref 19–32)
Calcium: 8.5 mg/dL (ref 8.4–10.5)
Chloride: 103 mEq/L (ref 96–112)
Creatinine, Ser: 1.41 mg/dL — ABNORMAL HIGH (ref 0.50–1.35)
GFR calc Af Amer: 54 mL/min — ABNORMAL LOW (ref >90)
GFR calc non Af Amer: 46 mL/min — ABNORMAL LOW (ref >90)
Glucose, Bld: 147 mg/dL — ABNORMAL HIGH (ref 70–99)
Potassium: 3.7 mEq/L (ref 3.7–5.3)
Sodium: 142 mEq/L (ref 137–147)

## 2013-11-30 LAB — GLUCOSE, CAPILLARY
Glucose-Capillary: 106 mg/dL — ABNORMAL HIGH (ref 70–99)
Glucose-Capillary: 163 mg/dL — ABNORMAL HIGH (ref 70–99)
Glucose-Capillary: 186 mg/dL — ABNORMAL HIGH (ref 70–99)
Glucose-Capillary: 118 mg/dL — ABNORMAL HIGH (ref 70–99)

## 2013-11-30 MED ORDER — ASPIRIN EC 81 MG PO TBEC
81.0000 mg | DELAYED_RELEASE_TABLET | Freq: Every day | ORAL | Status: DC
  Administered 2013-11-30 – 2013-12-03 (×4): 81 mg via ORAL
  Filled 2013-11-30 (×5): qty 1

## 2013-11-30 MED ORDER — ATORVASTATIN CALCIUM 80 MG PO TABS
80.0000 mg | ORAL_TABLET | Freq: Every day | ORAL | Status: DC
  Administered 2013-11-30: 80 mg via ORAL
  Filled 2013-11-30 (×4): qty 1

## 2013-11-30 MED ORDER — HYDRALAZINE HCL 25 MG PO TABS
25.0000 mg | ORAL_TABLET | Freq: Three times a day (TID) | ORAL | Status: DC
  Administered 2013-11-30 – 2013-12-01 (×4): 25 mg via ORAL
  Filled 2013-11-30 (×6): qty 1

## 2013-11-30 NOTE — Progress Notes (Addendum)
Received Diabetes Coordinator consult.  Spoke with patient at bedside.  Wife present in room.  Has had DM since age 80.  Has been on oral medication all that time.  Had DM in the family-brother, father.  Checks CBGs at home 2-3 times per day. Usually runs 88 mg/dl fasting and 160 mg/dl at night.  Is very careful about his diet. Very specific about foot care.  He gets pedicures and examines and cleans his feet every night. Sees Dr. Woody Seller in [place], [place] for control of his diabetes.  Had heart attack 10 years ago with a quadruple bypass. Has seen a nutritionist in the past and has lost weight to reach an ideal body weight of 165 lbs.  Takes Glipizide and Metformin at home. Past HgbA1C was 6.3%. Very pleasant person to talk with.  Very well informed patient and wife on diabetes control.  Harvel Ricks RN BSN CDE

## 2013-11-30 NOTE — Progress Notes (Signed)
If CBGs continue to trend down t less than 180 mg/dl, may want to change Novolog correction scale to SENSITIVE TID & HS. Harvel Ricks RN BSN CDE

## 2013-11-30 NOTE — Progress Notes (Addendum)
CARDIAC REHAB PHASE I   PRE:  Rate/Rhythm: 73 SR    BP: sitting 114/45    SaO2: 96 RA  MODE:  Ambulation: 340 ft   POST:  Rate/Rhythm: 106 ST with PVCs    BP: sitting 134/56     SaO2: 96 RA  Pt weak with feebly steps. Declined using RW. Discussed using his shoes or RW or both next walk. Pt denied CP or SOB, sts he is feeling better than 2 weeks ago. To recliner after walk. Having ectopy walking, ceased with rest. Will f/u. [id number]   Josephina Shih [place] CES, ACSM 11/30/2013 3:43 PM

## 2013-11-30 NOTE — Progress Notes (Signed)
Advanced Heart Failure Rounding Note   Subjective:   Mr Clayton Morton is a 80 year old with a history of ICM, chronic systolic heart failure, HTN, CVA 2006, DMII, hyperlipidemia. ECHO 9/4/156 EF 10%   Admitted from cath due to low output HF and markedly elevated filling pressures. He was started on lasix 80 mg twice a day. Negative 2.2 liters. He continues on his Arb and BB.   Denies SOB/Orthopnea/CP . Very good diuresis Weight down 8-9 pounds.    11/29/13 RHC/LHC LV 151/28  Coronary capillary wedge pressure: 34  Pulmonary artery: 65/28 with a mean of 42  RV 65/19  RA mean of 15  Oxygen saturations:  Pulmonary artery 49%  Aorta 94%  Cardiac output 3.25 L per minute  Cardiac index 1.7 L per minute per meter square 3 vessel disease.  Left mainstem: The left mainstem is patent. It arises from the left coronary cusp and has 80% distal vessel stenosis before it divides into the LAD and left circumflex.  Left anterior descending (LAD): The LAD is occluded after the first diagonal branch. The ostium of the LAD has a least 80% stenosis. There is moderate calcification present. The first diagonal branch has 90% ostial stenosis. The segment of LAD between the first and second diagonal branch has 95% stenosis.  Left circumflex (LCx): The left circumflex is severely diseased. There is a small intermediate branch with 95% ostial stenosis. The proximal circumflex has 95% stenosis. The first and second OM branches are patent with diffuse disease. The mid circumflex leading into the first OM branch has 90% stenosis. The distal circumflex between the first and second OM branches have 75% stenosis.  Right coronary artery (RCA): I was unable to find the right coronary origin. I suspect the vessel has a proximal occlusion.  Saphenous vein graft to PDA and PLA is patent. The mid body the graft is 50% stenosis. The PDA portion of the graft is patent. The PL segment is occluded.  The saphenous vein graft to diagonal is  patent until the coronary anastomotic site where there is 95% local stenosis. The diagonal branch is patent throughout the native vessel portion.  LIMA to LAD: Patent vessel supplying only the apical portion of the LAD.   Objective:   Weight Range:  Vital Signs:   Temp:  [97.4 F (36.3 C)-98.4 F (36.9 C)] 97.4 F (36.3 C) (09/11 0706) Pulse Rate:  [64-102] 87 (09/11 0833) Resp:  [0-28] 23 (09/11 0706) BP: (121-176)/(40-130) 167/82 mmHg (09/11 0833) SpO2:  [95 %-100 %] 98 % (09/11 0706) Weight:  [160 lb 4.8 oz (72.712 kg)-169 lb 5 oz (76.8 kg)] 160 lb 4.8 oz (72.712 kg) (09/11 0400)    Weight change: Filed Weights   11/29/13 0823 11/29/13 1730 11/30/13 0400  Weight: 168 lb (76.204 kg) 169 lb 5 oz (76.8 kg) 160 lb 4.8 oz (72.712 kg)    Intake/Output:   Intake/Output Summary (Last 24 hours) at 11/30/13 0841 Last data filed at 11/30/13 0600  Gross per 24 hour  Intake    440 ml  Output   2690 ml  Net  -2250 ml     Physical Exam: General:  Well appearing. No resp difficulty lying in bed.  HEENT: normal Neck: supple. JVP to jaw . Carotids 2+ bilat; no bruits. No lymphadenopathy or thryomegaly appreciated. Cor: PMI nondisplaced. Regular rate & rhythm. No rubs, gallops or murmurs. Lungs: clear Abdomen: soft, nontender, nondistended. No hepatosplenomegaly. No bruits or masses. Good bowel sounds. Extremities: no cyanosis,  clubbing, rash, R and LLE 2+ edema Neuro: alert & orientedx3, cranial nerves grossly intact. moves all 4 extremities w/o difficulty. Affect pleasant  Telemetry: SR 90s  Labs: Basic Metabolic Panel:  Recent Labs Lab 11/26/13 0039 11/29/13 1910 11/30/13 0250  NA 142  --  142  K 3.9  --  3.7  CL 107  --  103  CO2 22  --  25  GLUCOSE 183*  --  147*  BUN 28*  --  33*  CREATININE 1.47* 1.12 1.41*  CALCIUM 8.9  --  8.5    Liver Function Tests:  Recent Labs Lab 11/26/13 0039  AST 11  ALT 17  ALKPHOS 79  BILITOT 0.4  PROT 6.3  ALBUMIN 3.1*    No results found for this basename: LIPASE, AMYLASE,  in the last 168 hours No results found for this basename: AMMONIA,  in the last 168 hours  CBC:  Recent Labs Lab 11/26/13 0039 11/29/13 1910  WBC 5.6 9.7  NEUTROABS 3.9  --   HGB 12.7* 12.9*  HCT 37.9* 38.5*  MCV 91.1 91.2  PLT 198 210    Cardiac Enzymes: No results found for this basename: CKTOTAL, CKMB, CKMBINDEX, TROPONINI,  in the last 168 hours  BNP: BNP (last 3 results)  Recent Labs  11/26/13 0039  PROBNP 20669.0*     Other results:  EKG:   Imaging:  No results found.   Medications:     Scheduled Medications: . ALPRAZolam  0.125 mg Oral q morning - 10a  . carvedilol  3.125 mg Oral BID WC  . furosemide  80 mg Intravenous BID  . glipiZIDE  10 mg Oral BID WC  . heparin  5,000 Units Subcutaneous 3 times per day  . hydrALAZINE  10 mg Oral 4 times per day  . insulin aspart  0-15 Units Subcutaneous TID WC  . insulin aspart  0-5 Units Subcutaneous QHS  . irbesartan  150 mg Oral Daily  . isosorbide mononitrate  30 mg Oral Daily  . levothyroxine  50 mcg Oral QAC breakfast  . potassium chloride  10 mEq Oral BID  . sodium chloride  3 mL Intravenous Q12H     Infusions:     PRN Medications:  sodium chloride, acetaminophen, albuterol, ondansetron (ZOFRAN) IV, sodium chloride   Assessment:  1. A/C Systolic HF ECHO EF [id number]  Declined ICD in past.  2. ICM 3. CAD- Multivessel CAD status post CABG 2005 4. HTN  5. CKD  6. Hyperlipidemia   7. DM II , Uncotrolled Glucose 297 hgb a1C    Plan/Discussion:    Volume status remains elevated but improving. Continue lasix 80 mg IV BID. Continue low dose carvedilol 3.125 mg twice a day. Add digoxin 0.125 mg daily. Continue irbesartan 150 mg daily will watch renal function closely. Creatinine up 1.1>1.4. (had LHC 11/29/13) Check BMET tomorrow. Increase hydralazine to 25 mg tid and continue imdur 30 mg daily. Add ted hose  No evidence of ischemia. Add 81  aspirin. Restart statin. On 3.125 mg carvedilol twice a day. Consult cardiac rehab  Glucose unconrolled on sliding scale insulin and glipizide. Consult diabetes coordinator. Check hgb A1c  On levothyroxine 50 mcg daily. Check TSH.    Length of Stay: 1   CLEGG,AMY NP-C  11/30/2013, 8:41 AM Advanced Heart Failure Team Pager [phone] (M-F; [place])  Please contact [facility] for night-coverage after hours (4p -7a ) and weekends on [email or web address]  Patient seen and examined with Darrick Grinder,  NP. We discussed all aspects of the encounter. I agree with the assessment and plan as stated above.   He is responding well to IV diuresis. Will continue for at least one more day. So far no need for inotropes. Need to watch renal function closely. If creatinine bumps tomorrow may consider central access to follow co-ox. Will likely need to consider advanced therapies at some point. Will need repeat RHC or CPX once volume status optimized.   Daniel Bensimhon,MD 7:11 PM

## 2013-11-30 NOTE — Care Management Note (Addendum)
    Page 1 of 1   12/03/2013     3:19:38 PM CARE MANAGEMENT NOTE 12/03/2013  Patient:  Clayton Morton, Clayton Morton   Account Number:  [id number]  Date Initiated:  11/30/2013  Documentation initiated by:  Elissa Hefty  Subjective/Objective Assessment:   adm w heart failure     Action/Plan:   lives w wife, pcp dr vyas   Anticipated DC Date:  12/03/2013   Anticipated DC Plan:  [place]  CM consult      Choice offered to / List presented to:             Status of service:   Medicare Important Message given?  YES (If response is "NO", the following Medicare IM given date fields will be blank) Date Medicare IM given:  12/03/2013 Medicare IM given by:  Belford Pascucci Date Additional Medicare IM given:   Additional Medicare IM given by:    Discharge Disposition:  HOME/SELF CARE  Per UR Regulation:  Reviewed for med. necessity/level of care/duration of stay  If discussed at [place] of Stay Meetings, dates discussed:    Comments:  12/03/13 Ellan Lambert, RN, BSN [phone] Pt for dc home today with spouse.  No dc needs identified.

## 2013-11-30 NOTE — Progress Notes (Signed)
Pt with 5 bts of vtach. Pt asymptomatic and vitals are WNL and posted below. Pt with history of 3 bt run of vtach with this admission as well. Md on call notified at 2210 and new orders were not received. Will continue to monitor patient's condition.  Blood pressure 143/73, pulse 77, temperature 97.9 F (36.6 C), temperature source Oral, resp. rate 19, height 5\' 8"  (1.727 m), weight 72.712 kg (160 lb 4.8 oz), SpO2 96.00%.

## 2013-12-01 DIAGNOSIS — N189 Chronic kidney disease, unspecified: Secondary | ICD-10-CM

## 2013-12-01 DIAGNOSIS — N289 Disorder of kidney and ureter, unspecified: Secondary | ICD-10-CM

## 2013-12-01 LAB — BASIC METABOLIC PANEL
Anion gap: 12 (ref 5–15)
BUN: 40 mg/dL — ABNORMAL HIGH (ref 6–23)
CO2: 29 mEq/L (ref 19–32)
Calcium: 8.8 mg/dL (ref 8.4–10.5)
Chloride: 100 mEq/L (ref 96–112)
Creatinine, Ser: 1.6 mg/dL — ABNORMAL HIGH (ref 0.50–1.35)
GFR calc Af Amer: 46 mL/min — ABNORMAL LOW (ref >90)
GFR calc non Af Amer: 40 mL/min — ABNORMAL LOW (ref >90)
Glucose, Bld: 115 mg/dL — ABNORMAL HIGH (ref 70–99)
Potassium: 3.3 mEq/L — ABNORMAL LOW (ref 3.7–5.3)
Sodium: 141 mEq/L (ref 137–147)

## 2013-12-01 LAB — HEMOGLOBIN A1C
Hgb A1c MFr Bld: 7.9 % — ABNORMAL HIGH (ref <5.7)
Mean Plasma Glucose: 180 mg/dL — ABNORMAL HIGH (ref <117)

## 2013-12-01 LAB — TSH: TSH: 0.306 u[IU]/mL — ABNORMAL LOW (ref 0.350–4.500)

## 2013-12-01 LAB — GLUCOSE, CAPILLARY
Glucose-Capillary: 108 mg/dL — ABNORMAL HIGH (ref 70–99)
Glucose-Capillary: 222 mg/dL — ABNORMAL HIGH (ref 70–99)
Glucose-Capillary: 152 mg/dL — ABNORMAL HIGH (ref 70–99)
Glucose-Capillary: 105 mg/dL — ABNORMAL HIGH (ref 70–99)

## 2013-12-01 MED ORDER — POTASSIUM CHLORIDE CRYS ER 20 MEQ PO TBCR
40.0000 meq | EXTENDED_RELEASE_TABLET | Freq: Once | ORAL | Status: AC
  Administered 2013-12-01: 40 meq via ORAL
  Filled 2013-12-01: qty 2

## 2013-12-01 MED ORDER — DIGOXIN 125 MCG PO TABS
0.1250 mg | ORAL_TABLET | Freq: Every day | ORAL | Status: DC
  Administered 2013-12-01 – 2013-12-03 (×3): 0.125 mg via ORAL
  Filled 2013-12-01 (×3): qty 1

## 2013-12-01 MED ORDER — HYDRALAZINE HCL 25 MG PO TABS
37.5000 mg | ORAL_TABLET | Freq: Three times a day (TID) | ORAL | Status: DC
  Administered 2013-12-01 – 2013-12-02 (×3): 37.5 mg via ORAL
  Filled 2013-12-01 (×5): qty 1.5

## 2013-12-01 NOTE — Progress Notes (Signed)
Advanced Heart Failure Rounding Note   Subjective:    Clayton Morton is a 80 year old with a history of chronic systolic heart failure due to iCM (EF 10-15%), HTN, CVA 2006, DMII, hyperlipidemia. Has refused ICD in past.  Admitted from cath due to low output HF and markedly elevated filling pressures. He was started on lasix 80 mg twice a day. Negative 2.2 liters. He continues on his Arb and BB.   Denies SOB/Orthopnea/CP . Very good diuresis. Weight down another 7 pounds. Now 15 pounds down. Had 5 beats NSVT  Cr 1.1->1.4->1,6  11/29/13 RHC/LHC LV 151/28  Coronary capillary wedge pressure: 34  Pulmonary artery: 65/28 with a mean of 42  RV 65/19  RA mean of 15  Oxygen saturations:  Pulmonary artery 49%  Aorta 94%  Cardiac output 3.25 L per minute  Cardiac index 1.7 L per minute per meter square CAD stable   Objective:   Weight Range:  Vital Signs:   Temp:  [97.3 F (36.3 C)-98 F (36.7 C)] 97.3 F (36.3 C) (09/12 1118) Pulse Rate:  [63-97] 75 (09/12 1118) Resp:  [11-22] 12 (09/12 1118) BP: (97-143)/(51-75) 97/59 mmHg (09/12 1118) SpO2:  [96 %-99 %] 97 % (09/12 1118) Weight:  [69.4 kg (153 lb)] 69.4 kg (153 lb) (09/12 0400) Last BM Date: 11/30/13  Weight change: Filed Weights   11/29/13 1730 11/30/13 0400 12/01/13 0400  Weight: 76.8 kg (169 lb 5 oz) 72.712 kg (160 lb 4.8 oz) 69.4 kg (153 lb)    Intake/Output:   Intake/Output Summary (Last 24 hours) at 12/01/13 1507 Last data filed at 12/01/13 1329  Gross per 24 hour  Intake    500 ml  Output   3200 ml  Net  -2700 ml     Physical Exam: General:  Well appearing. No resp difficulty lying in bed.  HEENT: normal Neck: supple. JVP to jaw . Carotids 2+ bilat; no bruits. No lymphadenopathy or thryomegaly appreciated. Cor: PMI nondisplaced. Regular rate & rhythm. No rubs, gallops or murmurs. Lungs: clear Abdomen: soft, nontender, nondistended. No hepatosplenomegaly. No bruits or masses. Good bowel sounds. Extremities: no  cyanosis, clubbing, rash, R and LLE 2+ edema Neuro: alert & orientedx3, cranial nerves grossly intact. moves all 4 extremities w/o difficulty. Affect pleasant  Telemetry: SR 90s  Labs: Basic Metabolic Panel:  Recent Labs Lab 11/26/13 0039 11/29/13 1910 11/30/13 0250 12/01/13 0434  NA 142  --  142 141  K 3.9  --  3.7 3.3*  CL 107  --  103 100  CO2 22  --  25 29  GLUCOSE 183*  --  147* 115*  BUN 28*  --  33* 40*  CREATININE 1.47* 1.12 1.41* 1.60*  CALCIUM 8.9  --  8.5 8.8    Liver Function Tests:  Recent Labs Lab 11/26/13 0039  AST 11  ALT 17  ALKPHOS 79  BILITOT 0.4  PROT 6.3  ALBUMIN 3.1*   No results found for this basename: LIPASE, AMYLASE,  in the last 168 hours No results found for this basename: AMMONIA,  in the last 168 hours  CBC:  Recent Labs Lab 11/26/13 0039 11/29/13 1910  WBC 5.6 9.7  NEUTROABS 3.9  --   HGB 12.7* 12.9*  HCT 37.9* 38.5*  MCV 91.1 91.2  PLT 198 210    Cardiac Enzymes: No results found for this basename: CKTOTAL, CKMB, CKMBINDEX, TROPONINI,  in the last 168 hours  BNP: BNP (last 3 results)  Recent Labs  11/26/13  [place] [address].0*     Other results:    Imaging: No results found.   Medications:     Scheduled Medications: . ALPRAZolam  0.125 mg Oral q morning - 10a  . aspirin EC  81 mg Oral Daily  . atorvastatin  80 mg Oral q1800  . carvedilol  3.125 mg Oral BID WC  . furosemide  80 mg Intravenous BID  . glipiZIDE  10 mg Oral BID WC  . heparin  5,000 Units Subcutaneous 3 times per day  . hydrALAZINE  25 mg Oral 3 times per day  . insulin aspart  0-15 Units Subcutaneous TID WC  . insulin aspart  0-5 Units Subcutaneous QHS  . irbesartan  150 mg Oral Daily  . isosorbide mononitrate  30 mg Oral Daily  . levothyroxine  50 mcg Oral QAC breakfast  . potassium chloride  10 mEq Oral BID  . sodium chloride  3 mL Intravenous Q12H    Infusions:    PRN Medications: sodium chloride, acetaminophen,  albuterol, ondansetron (ZOFRAN) IV, sodium chloride   Assessment:  1. A/C Systolic HF ECHO EF [id number]  Declined ICD in past.  2. ICM 3. CAD- Multivessel CAD status post CABG 2005 4. HTN  5. CKD  6. Hyperlipidemia   7. DM II , Uncotrolled Glucose 297 hgb a1C  8. NSVT  9. AKI 10. Hypokalemia  Plan/Discussion:    Volume status much improved. He is now dry. Will hold diuretics today and continue to try and optimize HF regimen.   We had a long talk with him and his family about options for advanced HF including: ongoing medical therapy, home inotropes, VAD. Not sure he would be VAD candidate.   Will optimize meds and plan repeat RHC on Monday to guide further decision-making.   With NSVT and EF would consider ICD. But with Class IV HF may not be candidate. Will d/w EP.   Would favor medical therapy for CAD  Supp K+.  Kelle Ruppert,MD 3:07 PM

## 2013-12-01 NOTE — Progress Notes (Signed)
CARDIAC REHAB PHASE I   PRE:  Rate/Rhythm: 38 SR with multifocal PVCs  BP:  Sitting: 131/59      SaO2: 97 RA  MODE:  Ambulation: 500 ft   POST:  Rate/Rhythm: 108 ST with PVCs  BP:  Sitting: 150/76     SaO2: 96 RA  1435-1505 Patient ambulated in hallway x 1 assist. Fairly steady gait noted. Patient refused to walk with a walker but did walk with shoes. Wife and son at bedside. Patient denied complaints of SOB or CP during ambulation. Continued to have occasional PVCs. Post ambulation patient to recliner with call bell and bedside table in reach.  Clayton Morton, BSN 12/01/2013 3:05 PM

## 2013-12-02 DIAGNOSIS — I2510 Atherosclerotic heart disease of native coronary artery without angina pectoris: Secondary | ICD-10-CM

## 2013-12-02 DIAGNOSIS — I4949 Other premature depolarization: Secondary | ICD-10-CM

## 2013-12-02 LAB — BASIC METABOLIC PANEL
Anion gap: 14 (ref 5–15)
BUN: 43 mg/dL — ABNORMAL HIGH (ref 6–23)
CO2: 27 mEq/L (ref 19–32)
Calcium: 9.2 mg/dL (ref 8.4–10.5)
Chloride: 102 mEq/L (ref 96–112)
Creatinine, Ser: 1.66 mg/dL — ABNORMAL HIGH (ref 0.50–1.35)
GFR calc Af Amer: 44 mL/min — ABNORMAL LOW (ref >90)
GFR calc non Af Amer: 38 mL/min — ABNORMAL LOW (ref >90)
Glucose, Bld: 93 mg/dL (ref 70–99)
Potassium: 3.8 mEq/L (ref 3.7–5.3)
Sodium: 143 mEq/L (ref 137–147)

## 2013-12-02 LAB — GLUCOSE, CAPILLARY
Glucose-Capillary: 90 mg/dL (ref 70–99)
Glucose-Capillary: 252 mg/dL — ABNORMAL HIGH (ref 70–99)
Glucose-Capillary: 132 mg/dL — ABNORMAL HIGH (ref 70–99)
Glucose-Capillary: 288 mg/dL — ABNORMAL HIGH (ref 70–99)

## 2013-12-02 MED ORDER — SODIUM CHLORIDE 0.9 % IJ SOLN
3.0000 mL | Freq: Two times a day (BID) | INTRAMUSCULAR | Status: DC
  Administered 2013-12-02: 3 mL via INTRAVENOUS

## 2013-12-02 MED ORDER — HYDRALAZINE HCL 50 MG PO TABS
50.0000 mg | ORAL_TABLET | Freq: Three times a day (TID) | ORAL | Status: DC
  Administered 2013-12-02: 50 mg via ORAL
  Filled 2013-12-02 (×7): qty 1

## 2013-12-02 MED ORDER — ISOSORBIDE MONONITRATE ER 30 MG PO TB24
30.0000 mg | ORAL_TABLET | Freq: Two times a day (BID) | ORAL | Status: DC
  Administered 2013-12-02 – 2013-12-03 (×2): 30 mg via ORAL
  Filled 2013-12-02 (×4): qty 1

## 2013-12-02 MED ORDER — SODIUM CHLORIDE 0.9 % IV SOLN: 250.0000 mL | INTRAVENOUS | Status: DC | PRN

## 2013-12-02 MED ORDER — ALPRAZOLAM 0.25 MG PO TABS
0.2500 mg | ORAL_TABLET | Freq: Once | ORAL | Status: AC
  Administered 2013-12-02: 0.25 mg via ORAL

## 2013-12-02 MED ORDER — SODIUM CHLORIDE 0.9 % IJ SOLN: 3.0000 mL | INTRAMUSCULAR | Status: DC | PRN

## 2013-12-02 MED ORDER — ASPIRIN 81 MG PO CHEW
81.0000 mg | CHEWABLE_TABLET | ORAL | Status: AC
  Administered 2013-12-03: 81 mg via ORAL
  Filled 2013-12-02: qty 1

## 2013-12-02 NOTE — Progress Notes (Addendum)
Advanced Heart Failure Rounding Note   Subjective:    Mr Sridhar is a 80 year old with a history of chronic systolic heart failure due to iCM (EF 10-15%), HTN, CVA 2006, DMII, hyperlipidemia. Has refused ICD in past.  Admitted from cath due to low output HF and markedly elevated filling pressures. He was started on lasix 80 mg twice a day. Negative 2.2 liters. He continues on his Arb and BB.   Feels very good. Diuretic held yesterday due to renal dysfunction. Weight down another 2 pounds. Denies Cp or SOB. Lying flat.   Cr 1.1->1.4->1.60>1.66  11/29/13 RHC/LHC LV 151/28  Coronary capillary wedge pressure: 34  Pulmonary artery: 65/28 with a mean of 42  RV 65/19  RA mean of 15  Oxygen saturations:  Pulmonary artery 49%  Aorta 94%  Cardiac output 3.25 L per minute  Cardiac index 1.7 L per minute per meter square CAD stable   Objective:   Weight Range:  Vital Signs:   Temp:  [97.4 F (36.3 C)-98.2 F (36.8 C)] 97.7 F (36.5 C) (09/13 0812) Pulse Rate:  [40-97] 40 (09/13 0812) Resp:  [14-29] 20 (09/13 0812) BP: (120-150)/(62-87) 139/69 mmHg (09/13 0812) SpO2:  [92 %-98 %] 97 % (09/13 0812) Weight:  [68.765 kg (151 lb 9.6 oz)] 68.765 kg (151 lb 9.6 oz) (09/13 0329) Last BM Date: 11/30/13  Weight change: Filed Weights   11/30/13 0400 12/01/13 0400 12/02/13 0329  Weight: 72.712 kg (160 lb 4.8 oz) 69.4 kg (153 lb) 68.765 kg (151 lb 9.6 oz)    Intake/Output:   Intake/Output Summary (Last 24 hours) at 12/02/13 1406 Last data filed at 12/02/13 0330  Gross per 24 hour  Intake    120 ml  Output    800 ml  Net   -680 ml     Physical Exam: General:  Well appearing. No resp difficulty lying in bed.  HEENT: normal Neck: supple. JVP flat. Carotids 2+ bilat; no bruits. No lymphadenopathy or thryomegaly appreciated. Cor: PMI nondisplaced. Regular rate & rhythm. No rubs, gallops or murmurs. Lungs: clear Abdomen: soft, nontender, nondistended. No hepatosplenomegaly. No bruits or  masses. Good bowel sounds. Extremities: no cyanosis, clubbing, rash, R and LLE no edema Neuro: alert & orientedx3, cranial nerves grossly intact. moves all 4 extremities w/o difficulty. Affect pleasant  Telemetry: SR 90s  Labs: Basic Metabolic Panel:  Recent Labs Lab 11/26/13 0039 11/29/13 1910 11/30/13 0250 12/01/13 0434 12/02/13 0537  NA 142  --  142 141 143  K 3.9  --  3.7 3.3* 3.8  CL 107  --  103 100 102  CO2 22  --  25 29 27   GLUCOSE 183*  --  147* 115* 93  BUN 28*  --  33* 40* 43*  CREATININE 1.47* 1.12 1.41* 1.60* 1.66*  CALCIUM 8.9  --  8.5 8.8 9.2    Liver Function Tests:  Recent Labs Lab 11/26/13 0039  AST 11  ALT 17  ALKPHOS 79  BILITOT 0.4  PROT 6.3  ALBUMIN 3.1*   No results found for this basename: LIPASE, AMYLASE,  in the last 168 hours No results found for this basename: AMMONIA,  in the last 168 hours  CBC:  Recent Labs Lab 11/26/13 0039 11/29/13 1910  WBC 5.6 9.7  NEUTROABS 3.9  --   HGB 12.7* 12.9*  HCT 37.9* 38.5*  MCV 91.1 91.2  PLT 198 210    Cardiac Enzymes: No results found for this basename: CKTOTAL, CKMB, CKMBINDEX, TROPONINI,  in the last 168 hours  BNP: BNP (last 3 results)  Recent Labs  11/26/13 0039  PROBNP [id number]*     Other results:    Imaging: No results found.   Medications:     Scheduled Medications: . ALPRAZolam  0.125 mg Oral q morning - 10a  . aspirin EC  81 mg Oral Daily  . atorvastatin  80 mg Oral q1800  . carvedilol  3.125 mg Oral BID WC  . digoxin  0.125 mg Oral Daily  . glipiZIDE  10 mg Oral BID WC  . heparin  5,000 Units Subcutaneous 3 times per day  . hydrALAZINE  37.5 mg Oral 3 times per day  . insulin aspart  0-15 Units Subcutaneous TID WC  . insulin aspart  0-5 Units Subcutaneous QHS  . irbesartan  150 mg Oral Daily  . isosorbide mononitrate  30 mg Oral Daily  . levothyroxine  50 mcg Oral QAC breakfast  . potassium chloride  10 mEq Oral BID  . sodium chloride  3 mL  Intravenous Q12H    Infusions:    PRN Medications: sodium chloride, acetaminophen, albuterol, ondansetron (ZOFRAN) IV, sodium chloride   Assessment:  1. A/C Systolic HF ECHO EF [id number]  Declined ICD in past.  2. ICM 3. CAD- Multivessel CAD status post CABG 2005 4. HTN  5. CKD  6. Hyperlipidemia   7. DM II , Uncotrolled Glucose 297 hgb a1C  8. NSVT  9. AKI 10. Hypokalemia  Plan/Discussion:    Volume status much improved. He is now dry. Will hold diuretics again today and continue to try and optimize HF regimen. Increase hydral/nitrates. Continue low dose carvedilol and irbesartan. Digoxin started. Plan repeat RHC tomorrow to assess and guide further decision-making.   We had a long talk with him and his family about options for advanced HF including: ongoing medical therapy, home inotropes, VAD. Not sure he would be VAD candidate.   With NSVT and EF would consider ICD. But with Class IV HF may not be candidate. Will d/w EP.   Would favor medical therapy for CAD.  Can go to tele.   Muranda Coye,MD 2:06 PM

## 2013-12-03 ENCOUNTER — Encounter (HOSPITAL_COMMUNITY)
Admission: RE | Disposition: A | Discharge status: Home / Self Care | Payer: Self-pay | Source: Ambulatory Visit | Attending: Internal Medicine

## 2013-12-03 ENCOUNTER — Encounter (HOSPITAL_COMMUNITY): Payer: Self-pay | Admitting: Anesthesiology

## 2013-12-03 DIAGNOSIS — I509 Heart failure, unspecified: Secondary | ICD-10-CM

## 2013-12-03 HISTORY — PX: RIGHT HEART CATHETERIZATION: SHX5447

## 2013-12-03 LAB — BASIC METABOLIC PANEL
Anion gap: 13 (ref 5–15)
BUN: 41 mg/dL — ABNORMAL HIGH (ref 6–23)
CO2: 26 mEq/L (ref 19–32)
Calcium: 8.9 mg/dL (ref 8.4–10.5)
Chloride: 103 mEq/L (ref 96–112)
Creatinine, Ser: 1.46 mg/dL — ABNORMAL HIGH (ref 0.50–1.35)
GFR calc Af Amer: 51 mL/min — ABNORMAL LOW (ref >90)
GFR calc non Af Amer: 44 mL/min — ABNORMAL LOW (ref >90)
Glucose, Bld: 71 mg/dL (ref 70–99)
Potassium: 3.8 mEq/L (ref 3.7–5.3)
Sodium: 142 mEq/L (ref 137–147)

## 2013-12-03 LAB — GLUCOSE, CAPILLARY
Glucose-Capillary: 88 mg/dL (ref 70–99)
Glucose-Capillary: 220 mg/dL — ABNORMAL HIGH (ref 70–99)

## 2013-12-03 SURGERY — RIGHT HEART CATH
Anesthesia: LOCAL

## 2013-12-03 MED ORDER — LOSARTAN POTASSIUM 50 MG PO TABS
50.0000 mg | ORAL_TABLET | Freq: Every day | ORAL | Status: DC
  Administered 2013-12-03: 50 mg via ORAL
  Filled 2013-12-03: qty 1

## 2013-12-03 MED ORDER — CARVEDILOL 6.25 MG PO TABS
6.2500 mg | ORAL_TABLET | Freq: Two times a day (BID) | ORAL | Status: DC
  Filled 2013-12-03 (×2): qty 1

## 2013-12-03 MED ORDER — HEPARIN (PORCINE) IN NACL 2-0.9 UNIT/ML-% IJ SOLN
INTRAMUSCULAR | Status: AC
  Filled 2013-12-03: qty 500

## 2013-12-03 MED ORDER — FUROSEMIDE 40 MG PO TABS: 40.0000 mg | ORAL_TABLET | Freq: Every day | ORAL | Status: DC

## 2013-12-03 MED ORDER — SODIUM CHLORIDE 0.9 % IJ SOLN: 3.0000 mL | Freq: Two times a day (BID) | INTRAMUSCULAR | Status: DC

## 2013-12-03 MED ORDER — DIGOXIN 125 MCG PO TABS: 0.1250 mg | ORAL_TABLET | Freq: Every day | ORAL | Status: DC

## 2013-12-03 MED ORDER — FENTANYL CITRATE 0.05 MG/ML IJ SOLN
INTRAMUSCULAR | Status: AC
  Filled 2013-12-03: qty 2

## 2013-12-03 MED ORDER — ATORVASTATIN CALCIUM 80 MG PO TABS: 80.0000 mg | ORAL_TABLET | Freq: Every day | ORAL | Status: DC

## 2013-12-03 MED ORDER — SODIUM CHLORIDE 0.9 % IV SOLN: 250.0000 mL | INTRAVENOUS | Status: DC | PRN

## 2013-12-03 MED ORDER — ONDANSETRON HCL 4 MG/2ML IJ SOLN: 4.0000 mg | Freq: Four times a day (QID) | INTRAMUSCULAR | Status: DC | PRN

## 2013-12-03 MED ORDER — HYDRALAZINE HCL 25 MG PO TABS: 37.5000 mg | ORAL_TABLET | Freq: Three times a day (TID) | ORAL | Status: DC

## 2013-12-03 MED ORDER — CARVEDILOL 6.25 MG PO TABS: 6.2500 mg | ORAL_TABLET | Freq: Two times a day (BID) | ORAL | Status: DC

## 2013-12-03 MED ORDER — ACETAMINOPHEN 325 MG PO TABS: 650.0000 mg | ORAL_TABLET | ORAL | Status: DC | PRN

## 2013-12-03 MED ORDER — POTASSIUM CHLORIDE CRYS ER 20 MEQ PO TBCR: 20.0000 meq | EXTENDED_RELEASE_TABLET | Freq: Every day | ORAL | Status: DC

## 2013-12-03 MED ORDER — POTASSIUM CHLORIDE CRYS ER 20 MEQ PO TBCR: EXTENDED_RELEASE_TABLET | ORAL | Status: DC

## 2013-12-03 MED ORDER — LOSARTAN POTASSIUM 50 MG PO TABS: 50.0000 mg | ORAL_TABLET | Freq: Every day | ORAL | Status: DC

## 2013-12-03 MED ORDER — SODIUM CHLORIDE 0.9 % IJ SOLN: 3.0000 mL | INTRAMUSCULAR | Status: DC | PRN

## 2013-12-03 MED ORDER — LIDOCAINE HCL (PF) 1 % IJ SOLN
INTRAMUSCULAR | Status: AC
  Filled 2013-12-03: qty 30

## 2013-12-03 MED ORDER — METFORMIN HCL 500 MG PO TABS: 500.0000 mg | ORAL_TABLET | Freq: Every day | ORAL | Status: DC

## 2013-12-03 MED ORDER — MIDAZOLAM HCL 2 MG/2ML IJ SOLN
INTRAMUSCULAR | Status: AC
  Filled 2013-12-03: qty 2

## 2013-12-03 MED ORDER — CARVEDILOL 3.125 MG PO TABS: 3.1250 mg | ORAL_TABLET | Freq: Two times a day (BID) | ORAL | Status: DC

## 2013-12-03 NOTE — Interval H&P Note (Signed)
History and Physical Interval Note:  12/03/2013 8:06 AM  Clayton Morton  has presented today for surgery, with the diagnosis of CHF  The various methods of treatment have been discussed with the patient and family. After consideration of risks, benefits and other options for treatment, the patient has consented to  Procedure(s): RIGHT HEART CATH (N/A) as a surgical intervention .  The patient's history has been reviewed, patient examined, no change in status, stable for surgery.  I have reviewed the patient's chart and labs.  Questions were answered to the patient's satisfaction.     Daniel Bensimhon

## 2013-12-03 NOTE — Progress Notes (Signed)
Inpatient Diabetes Program Recommendations  AACE/ADA: New Consensus Statement on Inpatient Glycemic Control (2013)  Target Ranges:  Prepandial:   less than 140 mg/dL      Peak postprandial:   less than 180 mg/dL (1-2 hours)      Critically ill patients:  140 - 180 mg/dL  Results for [name], [name] (MRN [id number]) as of 12/03/2013 10:42  Ref. Range 12/02/2013 08:16 12/02/2013 11:50 12/02/2013 16:25 12/02/2013 21:19 12/03/2013 06:34  Glucose-Capillary Latest Range: 70-99 mg/dL 90 252 (H) 132 (H) 288 (H) 88   Inpatient Diabetes Program Recommendations Insulin - Meal Coverage: consider adding Novolog 3 units TID with meals per Glycemic Control Order set Thank you  Raoul Pitch BSN, RN,CDE Inpatient Diabetes Coordinator [phone] (team pager)

## 2013-12-03 NOTE — Discharge Summary (Signed)
Advanced Heart Failure Team  Discharge Summary   Patient ID: Clayton Morton MRN: [id number], DOB/AGE: Aug 05, 1932 80 y.o. Admit date: 11/29/2013 D/C date:     12/03/2013   Primary Discharge Diagnoses:  1) Cardiogenic shock 2) A/C systolic HF - EF 123456, RV mod dilated and systolic fx severely reduced, TAPSE 0.8 cm (11/23/13)  Secondary Discharge Diagnoses:  1) ICM 2) CAD, multivessel CAD s/p CABG 3) HTN 4) AKI 5) HLD 6) DM II - Hgb A1c 7.9 7) NSVT 8) Hypokalemia  Hospital Course:  Mr. Mansker is a 81 yo with a history of ICM, chronic systolic HF, HTN, CVA 123456, DM2, HLD and CAD s/p CABG 2005.  He presented to the hospital on 11/29/13 for a scheduled L/RHC d/t recent increase in SOB and ECHO showing decline in EF to 10% with biventricular HF. His cath showed elevated LVEDP and wedge with low output and severe CAD s/p aortocoronary bypass surgery with patency of the SVG to PDA, occlusion of the SVG sequence to PLA, patency of LIMA to LAD and patency of SVG to diagonal with severe stenosis at the coronary anastomotic site. He was admitted to the hospital and started on IV lasix with good diuresis. He diuresed a net negative of 5.9 liters and weight decreased to 152 lbs on day of discharge. His medications were adjusted and he was started on hydralazine/nitrates, however the Imdur was stopped d/t severe headache and the hydralazine was continued for afterload reduction. Digoxin was also added and he was switched from amLODipine-olmesartan to losartan which he tolerated. He had AKI on admission and his renal function continued to improve and trend back towards baseline. On 12/03/13 he was taken back to the cath lab to reassess his hemodynamics. His cath showed low filling pressures with normal CO and his diuretic was placed on hold.   Education was completed with the patient and family about HF, daily weights and taking medications as prescribed. Our goal will be to try and titrate his medications on the  OP side and will need to consider whether he would be an ICD candidate. We will see him back next week in the HF clinic and will be followed closely with medication titration. On day of discharge he denied any SOB, orthopnea or CP and was ambulating in the halls. Discharge weight 152 lbs.    Physical Exam:  General: Well appearing. No resp difficulty lying in bed.  HEENT: normal  Neck: supple. JVP flat. Carotids 2+ bilat; no bruits. No lymphadenopathy or thryomegaly appreciated.  Cor: PMI nondisplaced. Regular rate & rhythm. No rubs, gallops or murmurs.  Lungs: clear  Abdomen: soft, nontender, nondistended. No hepatosplenomegaly. No bruits or masses. Good bowel sounds.  Extremities: no cyanosis, clubbing, rash, R and LLE no edema  Neuro: alert & orientedx3, cranial nerves grossly intact. moves all 4 extremities w/o difficulty. Affect pleasant  Discharge Weight Range: 153-158 lbs.  Discharge Vitals: Blood pressure 157/83, pulse 98, temperature 98.7 F (37.1 C), temperature source Oral, resp. rate 18, height 5\' 8"  (1.727 m), weight 152 lb 1.9 oz (69 kg), SpO2 97.00%.  Labs: Lab Results  Component Value Date   WBC 9.7 11/29/2013   HGB 12.9* 11/29/2013   HCT 38.5* 11/29/2013   MCV 91.2 11/29/2013   PLT 210 11/29/2013     Recent Labs Lab 12/03/13 0548  NA 142  K 3.8  CL 103  CO2 26  BUN 41*  CREATININE 1.46*  CALCIUM 8.9  GLUCOSE 71   Lab Results  Component Value Date   CHOL 268* 09/12/2007   HDL 42 09/12/2007   LDLCALC 190* 09/12/2007   TRIG 179* 09/12/2007   BNP (last 3 results)  Recent Labs  11/26/13 0039  PROBNP 20669.0*    Diagnostic Studies/Procedures   11/29/13 RHC/LHC  LV 151/28  Coronary capillary wedge pressure: 34  Pulmonary artery: 65/28 with a mean of 42  RV 65/19  RA mean of 15  Oxygen saturations:  Pulmonary artery 49%  Aorta 94%  Cardiac output 3.25 L per minute  Cardiac index 1.7 L per minute per meter square  3 vessel disease.  Left mainstem: The  left mainstem is patent. It arises from the left coronary cusp and has 80% distal vessel stenosis before it divides into the LAD and left circumflex.  Left anterior descending (LAD): The LAD is occluded after the first diagonal branch. The ostium of the LAD has a least 80% stenosis. There is moderate calcification present. The first diagonal branch has 90% ostial stenosis. The segment of LAD between the first and second diagonal branch has 95% stenosis.  Left circumflex (LCx): The left circumflex is severely diseased. There is a small intermediate branch with 95% ostial stenosis. The proximal circumflex has 95% stenosis. The first and second OM branches are patent with diffuse disease. The mid circumflex leading into the first OM branch has 90% stenosis. The distal circumflex between the first and second OM branches have 75% stenosis.  Right coronary artery (RCA): I was unable to find the right coronary origin. I suspect the vessel has a proximal occlusion.  Saphenous vein graft to PDA and PLA is patent. The mid body the graft is 50% stenosis. The PDA portion of the graft is patent. The PL segment is occluded.  The saphenous vein graft to diagonal is patent until the coronary anastomotic site where there is 95% local stenosis. The diagonal branch is patent throughout the native vessel portion.  LIMA to LAD: Patent vessel supplying only the apical portion of the LAD.   RHC (12/03/13) RA = 1  RV = 25/1/2  PA = 27/10 (16)  PCW = 6  Fick cardiac output/index = 5.3/2.9  PVR = 1.9 WU  FA sat = 99%  PA sat = 72%, 74%      Discharge Medications     Medication List    STOP taking these medications       AZOR 5-40 MG per tablet  Generic drug:  amLODipine-olmesartan      TAKE these medications       albuterol 108 (90 BASE) MCG/ACT inhaler  Commonly known as:  PROVENTIL HFA;VENTOLIN HFA  Inhale 1-2 puffs into the lungs every 6 (six) hours as needed for wheezing or shortness of breath.      ALPRAZolam 0.25 MG tablet  Commonly known as:  XANAX  Take 0.125 mg by mouth every morning.     aspirin 81 MG tablet  Take 81 mg by mouth daily.     atorvastatin 80 MG tablet  Commonly known as:  LIPITOR  Take 1 tablet (80 mg total) by mouth daily at 6 PM.     carvedilol 3.125 MG tablet  Commonly known as:  COREG  Take 1 tablet (3.125 mg total) by mouth 2 (two) times daily.     digoxin 0.125 MG tablet  Commonly known as:  LANOXIN  Take 1 tablet (0.125 mg total) by mouth daily.     Fish Oil 1000 MG Caps  Take 1 capsule by  mouth 2 (two) times daily.     furosemide 40 MG tablet  Commonly known as:  LASIX  Take 1 tablet (40 mg total) by mouth daily. Or as instructed by provider  Start taking on:  12/05/2013     GLIPIZIDE XL 10 MG 24 hr tablet  Generic drug:  glipiZIDE  Take 10 mg by mouth 2 (two) times daily.     hydrALAZINE 25 MG tablet  Commonly known as:  APRESOLINE  Take 1.5 tablets (37.5 mg total) by mouth 3 (three) times daily.     ICAPS Tabs  Take 1 tablet by mouth daily.     levothyroxine 50 MCG tablet  Commonly known as:  SYNTHROID, LEVOTHROID  Take 50 mcg by mouth daily.     losartan 50 MG tablet  Commonly known as:  COZAAR  Take 1 tablet (50 mg total) by mouth daily.     metFORMIN 500 MG tablet  Commonly known as:  GLUCOPHAGE  Take 1 tablet (500 mg total) by mouth daily with breakfast.  Start taking on:  12/05/2013     potassium chloride SA 20 MEQ tablet  Commonly known as:  K-DUR,KLOR-CON  Do not take if you do not take lasix.     Vitamin B-12 1000 MCG Subl  Place under the tongue daily.        Disposition   The patient will be discharged in stable condition to home. Discharge Instructions   ACE Inhibitor / ARB already ordered    Complete by:  As directed      Beta Blocker already ordered    Complete by:  As directed      Diet - low sodium heart healthy    Complete by:  As directed      Discharge instructions    Complete by:  As directed    1) Make sure your review your medications closely because there have been multiple changes.  2) Stop taking these medications: Azor  3)  Change how you take these medications:  - Hold your Metformin until 12/05/13 and then resume 500 mg daily with breakfast.  3) Start these new medications: - Potassium 20 meq (1 tablet) daily. - Digoxin 0.125 mg (1 tablet) daily. - Losartan 50 mg (1 tablet) daily - Lasix 40 mg (1 tablet) daily, starting 12/05/13. If weight greater than 158 lbs take 40 mg BID, if weight less than 153 lbs hold lasix. Would like weight about 153-158 lbs. - Lipitor 80 mg (1 tablet) at night. - Hydralazine 37.5 mg (1 1/2 tablets) three times a day  Call any question [phone]     Heart Failure patients record your daily weight using the same scale at the same time of day    Complete by:  As directed      Increase activity slowly    Complete by:  As directed      STOP any activity that causes chest pain, shortness of breath, dizziness, sweating, or exessive weakness    Complete by:  As directed           Follow-up Information   Follow up with Rande Brunt, NP On 12/10/2013. (at 3:15pm in the Advanced Heart Failure Clinic--Gate Code is [id number])    Specialty:  Nurse Practitioner   Contact information:   [address] [address] [phone]         Duration of Discharge Encounter: Greater than 35 minutes   Signed, Rande Brunt  12/03/2013, 2:29 PM  Patient seen and examined with Junie Bame, NP. We discussed all aspects of the encounter. I agree with the assessment and plan as stated above.   He is much improved. Results of cath reviewed with him and his family. Agree with med changes as above. Will need close f/u in HF clinic to see how he does. Would continue to treat CAD medically at this point.   Benay Spice 7:26 PM

## 2013-12-03 NOTE — H&P (View-Only) (Signed)
Advanced Heart Failure Rounding Note   Subjective:    Clayton Morton is a 80 year old with a history of chronic systolic heart failure due to iCM (EF 10-15%), HTN, CVA 2006, DMII, hyperlipidemia. Has refused ICD in past.  Admitted from cath due to low output HF and markedly elevated filling pressures. He was started on lasix 80 mg twice a day. Negative 2.2 liters. He continues on his Arb and BB.   Feels very good. Diuretic held yesterday due to renal dysfunction. Weight down another 2 pounds. Denies Cp or SOB. Lying flat.   Cr 1.1->1.4->1.60>1.66  11/29/13 RHC/LHC LV 151/28  Coronary capillary wedge pressure: 34  Pulmonary artery: 65/28 with a mean of 42  RV 65/19  RA mean of 15  Oxygen saturations:  Pulmonary artery 49%  Aorta 94%  Cardiac output 3.25 L per minute  Cardiac index 1.7 L per minute per meter square CAD stable   Objective:   Weight Range:  Vital Signs:   Temp:  [97.4 F (36.3 C)-98.2 F (36.8 C)] 97.7 F (36.5 C) (09/13 0812) Pulse Rate:  [40-97] 40 (09/13 0812) Resp:  [14-29] 20 (09/13 0812) BP: (120-150)/(62-87) 139/69 mmHg (09/13 0812) SpO2:  [92 %-98 %] 97 % (09/13 0812) Weight:  [68.765 kg (151 lb 9.6 oz)] 68.765 kg (151 lb 9.6 oz) (09/13 0329) Last BM Date: 11/30/13  Weight change: Filed Weights   11/30/13 0400 12/01/13 0400 12/02/13 0329  Weight: 72.712 kg (160 lb 4.8 oz) 69.4 kg (153 lb) 68.765 kg (151 lb 9.6 oz)    Intake/Output:   Intake/Output Summary (Last 24 hours) at 12/02/13 1406 Last data filed at 12/02/13 0330  Gross per 24 hour  Intake    120 ml  Output    800 ml  Net   -680 ml     Physical Exam: General:  Well appearing. No resp difficulty lying in bed.  HEENT: normal Neck: supple. JVP flat. Carotids 2+ bilat; no bruits. No lymphadenopathy or thryomegaly appreciated. Cor: PMI nondisplaced. Regular rate & rhythm. No rubs, gallops or murmurs. Lungs: clear Abdomen: soft, nontender, nondistended. No hepatosplenomegaly. No bruits or  masses. Good bowel sounds. Extremities: no cyanosis, clubbing, rash, R and LLE no edema Neuro: alert & orientedx3, cranial nerves grossly intact. moves all 4 extremities w/o difficulty. Affect pleasant  Telemetry: SR 90s  Labs: Basic Metabolic Panel:  Recent Labs Lab 11/26/13 0039 11/29/13 1910 11/30/13 0250 12/01/13 0434 12/02/13 0537  NA 142  --  142 141 143  K 3.9  --  3.7 3.3* 3.8  CL 107  --  103 100 102  CO2 22  --  25 29 27   GLUCOSE 183*  --  147* 115* 93  BUN 28*  --  33* 40* 43*  CREATININE 1.47* 1.12 1.41* 1.60* 1.66*  CALCIUM 8.9  --  8.5 8.8 9.2    Liver Function Tests:  Recent Labs Lab 11/26/13 0039  AST 11  ALT 17  ALKPHOS 79  BILITOT 0.4  PROT 6.3  ALBUMIN 3.1*   No results found for this basename: LIPASE, AMYLASE,  in the last 168 hours No results found for this basename: AMMONIA,  in the last 168 hours  CBC:  Recent Labs Lab 11/26/13 0039 11/29/13 1910  WBC 5.6 9.7  NEUTROABS 3.9  --   HGB 12.7* 12.9*  HCT 37.9* 38.5*  MCV 91.1 91.2  PLT 198 210    Cardiac Enzymes: No results found for this basename: CKTOTAL, CKMB, CKMBINDEX, TROPONINI,  in the last 168 hours  BNP: BNP (last 3 results)  Recent Labs  11/26/13 0039  PROBNP [id number]*     Other results:    Imaging: No results found.   Medications:     Scheduled Medications: . ALPRAZolam  0.125 mg Oral q morning - 10a  . aspirin EC  81 mg Oral Daily  . atorvastatin  80 mg Oral q1800  . carvedilol  3.125 mg Oral BID WC  . digoxin  0.125 mg Oral Daily  . glipiZIDE  10 mg Oral BID WC  . heparin  5,000 Units Subcutaneous 3 times per day  . hydrALAZINE  37.5 mg Oral 3 times per day  . insulin aspart  0-15 Units Subcutaneous TID WC  . insulin aspart  0-5 Units Subcutaneous QHS  . irbesartan  150 mg Oral Daily  . isosorbide mononitrate  30 mg Oral Daily  . levothyroxine  50 mcg Oral QAC breakfast  . potassium chloride  10 mEq Oral BID  . sodium chloride  3 mL  Intravenous Q12H    Infusions:    PRN Medications: sodium chloride, acetaminophen, albuterol, ondansetron (ZOFRAN) IV, sodium chloride   Assessment:  1. A/C Systolic HF ECHO EF [id number]  Declined ICD in past.  2. ICM 3. CAD- Multivessel CAD status post CABG 2005 4. HTN  5. CKD  6. Hyperlipidemia   7. DM II , Uncotrolled Glucose 297 hgb a1C  8. NSVT  9. AKI 10. Hypokalemia  Plan/Discussion:    Volume status much improved. He is now dry. Will hold diuretics again today and continue to try and optimize HF regimen. Increase hydral/nitrates. Continue low dose carvedilol and irbesartan. Digoxin started. Plan repeat RHC tomorrow to assess and guide further decision-making.   We had a long talk with him and his family about options for advanced HF including: ongoing medical therapy, home inotropes, VAD. Not sure he would be VAD candidate.   With NSVT and EF would consider ICD. But with Class IV HF may not be candidate. Will d/w EP.   Would favor medical therapy for CAD.  Can go to tele.   Daniel Bensimhon,MD 2:06 PM

## 2013-12-03 NOTE — CV Procedure (Signed)
Cardiac Cath Procedure Note:  Indication:   Procedures performed:  1) Right heart catheterization  Description of procedure:   The risks and indication of the procedure were explained. Consent was signed and placed on the chart. An appropriate timeout was taken prior to the procedure. The right neck was prepped and draped in the routine sterile fashion and anesthetized with 1% local lidocaine.   A 7 FR venous sheath was placed in the right internal jugular vein using a modified Seldinger technique. A standard Swan-Ganz catheter was used for the procedure.   Complications: None apparent.  Findings:  RA = 1 RV = 25/1/2 PA = 27/10 (16) PCW = 6  Fick cardiac output/index = 5.3/2.9 PVR = 1.9 WU FA sat = 99% PA sat = 72%, 74%  Assessment: 1. Low filling pressures with normal cardiac output  Plan/Discussion:  Continue medical management of HF.   Clayton Morton 8:07 AM

## 2013-12-03 NOTE — Progress Notes (Signed)
Patient discharge instructions reviewed with wife and family. Patient and family verbalized understanding.  Patient vitals rechecked due to automatic check showing 85/54 and was found to be 92/60 manually.  Per Junie Bame, NP it was okay to send patient home with family and to encourage him to drink liquids. PIV's discontinued and patient taken out to family vehicle via staff volunteers. Alfredo Bach RN BSN 12/03/2013 2:47 PM

## 2013-12-04 LAB — POCT I-STAT 3, VENOUS BLOOD GAS (G3P V)
Acid-Base Excess: 4 mmol/L — ABNORMAL HIGH (ref 0.0–2.0)
Acid-Base Excess: 5 mmol/L — ABNORMAL HIGH (ref 0.0–2.0)
Acid-Base Excess: 3 mmol/L — ABNORMAL HIGH (ref 0.0–2.0)
Bicarbonate: 29.3 mEq/L — ABNORMAL HIGH (ref 20.0–24.0)
Bicarbonate: 29.9 mEq/L — ABNORMAL HIGH (ref 20.0–24.0)
Bicarbonate: 28.6 mEq/L — ABNORMAL HIGH (ref 20.0–24.0)
O2 Saturation: 72 %
O2 Saturation: 75 %
O2 Saturation: 74 %
TCO2: 31 mmol/L (ref 0–100)
TCO2: 31 mmol/L (ref 0–100)
TCO2: 30 mmol/L (ref 0–100)
pCO2, Ven: 45.6 mmHg (ref 45.0–50.0)
pCO2, Ven: 46.1 mmHg (ref 45.0–50.0)
pCO2, Ven: 45.3 mmHg (ref 45.0–50.0)
pH, Ven: 7.411 — ABNORMAL HIGH (ref 7.250–7.300)
pH, Ven: 7.425 — ABNORMAL HIGH (ref 7.250–7.300)
pH, Ven: 7.408 — ABNORMAL HIGH (ref 7.250–7.300)
pO2, Ven: 38 mmHg (ref 30.0–45.0)
pO2, Ven: 40 mmHg (ref 30.0–45.0)
pO2, Ven: 39 mmHg (ref 30.0–45.0)

## 2013-12-10 ENCOUNTER — Ambulatory Visit (HOSPITAL_COMMUNITY)
Admit: 2013-12-10 | Discharge: 2013-12-10 | Disposition: A | Discharge status: Home / Self Care | Payer: Medicare Other | Source: Ambulatory Visit | Attending: Internal Medicine | Admitting: Internal Medicine

## 2013-12-10 ENCOUNTER — Encounter (HOSPITAL_COMMUNITY): Payer: Self-pay

## 2013-12-10 VITALS — BP 140/71 | HR 78 | Wt 155.5 lb

## 2013-12-10 DIAGNOSIS — Z951 Presence of aortocoronary bypass graft: Secondary | ICD-10-CM | POA: Insufficient documentation

## 2013-12-10 DIAGNOSIS — E119 Type 2 diabetes mellitus without complications: Secondary | ICD-10-CM | POA: Diagnosis not present

## 2013-12-10 DIAGNOSIS — I509 Heart failure, unspecified: Secondary | ICD-10-CM | POA: Insufficient documentation

## 2013-12-10 DIAGNOSIS — I2510 Atherosclerotic heart disease of native coronary artery without angina pectoris: Secondary | ICD-10-CM | POA: Diagnosis not present

## 2013-12-10 DIAGNOSIS — I5022 Chronic systolic (congestive) heart failure: Secondary | ICD-10-CM | POA: Diagnosis not present

## 2013-12-10 DIAGNOSIS — I10 Essential (primary) hypertension: Secondary | ICD-10-CM | POA: Insufficient documentation

## 2013-12-10 DIAGNOSIS — E785 Hyperlipidemia, unspecified: Secondary | ICD-10-CM | POA: Insufficient documentation

## 2013-12-10 DIAGNOSIS — Z8673 Personal history of transient ischemic attack (TIA), and cerebral infarction without residual deficits: Secondary | ICD-10-CM | POA: Insufficient documentation

## 2013-12-10 LAB — BASIC METABOLIC PANEL
Anion gap: 11 (ref 5–15)
BUN: 31 mg/dL — ABNORMAL HIGH (ref 6–23)
CO2: 26 mEq/L (ref 19–32)
Calcium: 9.3 mg/dL (ref 8.4–10.5)
Chloride: 106 mEq/L (ref 96–112)
Creatinine, Ser: 1.46 mg/dL — ABNORMAL HIGH (ref 0.50–1.35)
GFR calc Af Amer: 51 mL/min — ABNORMAL LOW (ref >90)
GFR calc non Af Amer: 44 mL/min — ABNORMAL LOW (ref >90)
Glucose, Bld: 133 mg/dL — ABNORMAL HIGH (ref 70–99)
Potassium: 4.5 mEq/L (ref 3.7–5.3)
Sodium: 143 mEq/L (ref 137–147)

## 2013-12-10 LAB — DIGOXIN LEVEL: Digoxin Level: 0.7 ng/mL — ABNORMAL LOW (ref 0.8–2.0)

## 2013-12-10 MED ORDER — LOSARTAN POTASSIUM 50 MG PO TABS: ORAL_TABLET | ORAL | Status: DC

## 2013-12-10 NOTE — Progress Notes (Signed)
Patient ID: Clayton Morton, male   DOB: [birth year], 80 y.o.   MRN: [id number] PCP: Dr. Woody Seller Primary Cardiologist: [name]  HPI: Clayton Morton is a 80 yo with a history of ICM, chronic systolic HF, HTN, CVA 123456, DM2, HLD and CAD s/p CABG 2005.  Scheduled for L/RHC on 11/29/13 and was admitted with low output and severe CAD. Repeat RHC showed low filling pressures and normal CO.   ECHO (11/2013): EF 10%, RV mod dilated and systolic fx severely reduced  Post Follow up for Heart Failure: Doing well. Denies SOB, PND, orthopnea or CP. Taking medications as prescribed. No dizziness. Last 2 days he has been walking 2 blocks twice a day with no issues. Following low salt diet and drinking less than 2L a day. Weight at home 150-151 lbs. Has not needed any lasix.   ROS: All systems negative except as listed in HPI, PMH and Problem List.  SH:  History   Social History  . Marital Status: Married    Spouse Name: N/A    Number of Children: N/A  . Years of Education: N/A   Occupational History  . Not on file.   Social History Main Topics  . Smoking status: Never Smoker   . Smokeless tobacco: Never Used  . Alcohol Use: No  . Drug Use: No  . Sexual Activity: Not on file   Other Topics Concern  . 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 Lives in [place] with wife.     FH:  Family History  Problem Relation Age of Onset  . Hypertension Father     CAD, CHF; deceased at 23  . Dementia Mother     deceased at age 78    Past Medical History  Diagnosis Date  . Essential hypertension, benign   . Cerebrovascular accident     Right subcortical infarct 2006  . Cataract   . Macular degeneration     Dr. [name]  . Coronary atherosclerosis of native coronary artery     1) CABG 2005 2) LHC (910/15): patent SVG to PDA, occlusion of SVG sequence to PLA, patency of LIMA to LAD and patency of SVG to diagonal with sev stenosis at coronary anastomotic site  .  Hyperlipidemia   . Varicose veins   . Diabetes mellitus, type 2   . Non-ST elevation MI (NSTEMI)     2005  . Ischemic cardiomyopathy   . Chronic systolic congestive heart failure     a) ECHO (02/2012): EF 30-35%, no obvious thrombus b) ECHO (11/2013): EF 10%, AV mildly calcified, mild MR, RV mod dialted and sys fx sev reduced, TAPSE 0.8, lateral annulus peak S velocity 5.4 c) RHC (11/29/13): RA 15, RV: 65/19, PA 49%, CO/CI: 3.25 / 1.7 d) RHC (12/03/13): RA 1, RV 25/1/2, PA 27/10 (16), PCWP 6, Fick CO/CI: 5.3 / 2.9, PVR 1.9 WU, PA sat 72 and 74%     Current Outpatient Prescriptions  Medication Sig Dispense Refill  . albuterol (PROVENTIL HFA;VENTOLIN HFA) 108 (90 BASE) MCG/ACT inhaler Inhale 1-2 puffs into the lungs every 6 (six) hours as needed for wheezing or shortness of breath.  1 Inhaler  0  . ALPRAZolam (XANAX) 0.25 MG tablet Take 0.125 mg by mouth every morning.       [name] aspirin 81 MG tablet Take 81 mg by mouth daily.      [name] atorvastatin (LIPITOR) 80 MG tablet Take 1 tablet (80  mg total) by mouth daily at 6 PM.  30 tablet  6  . carvedilol (COREG) 3.125 MG tablet Take 1 tablet (3.125 mg total) by mouth 2 (two) times daily.  60 tablet  6  . Cyanocobalamin (VITAMIN B-12) 1000 MCG SUBL Place under the tongue daily.      . digoxin (LANOXIN) 0.125 MG tablet Take 1 tablet (0.125 mg total) by mouth daily.  30 tablet  3  . furosemide (LASIX) 40 MG tablet Take 1 tablet (40 mg total) by mouth daily. Or as instructed by provider  60 tablet  3  . GLIPIZIDE XL 10 MG 24 hr tablet Take 10 mg by mouth 2 (two) times daily.       . hydrALAZINE (APRESOLINE) 25 MG tablet Take 1.5 tablets (37.5 mg total) by mouth 3 (three) times daily.  135 tablet  3  . levothyroxine (SYNTHROID, LEVOTHROID) 50 MCG tablet Take 50 mcg by mouth daily.      [name] losartan (COZAAR) 50 MG tablet Take 1 tablet (50 mg total) by mouth daily.  30 tablet  3  . metFORMIN (GLUCOPHAGE) 500 MG tablet Take 1 tablet (500 mg total) by mouth daily with  breakfast.      . Multiple Vitamins-Minerals (ICAPS) TABS Take 1 tablet by mouth daily.      . Omega-3 Fatty Acids (FISH OIL) 1000 MG CAPS Take 1 capsule by mouth 2 (two) times daily.      . potassium chloride SA (K-DUR,KLOR-CON) 20 MEQ tablet Do not take if you do not take lasix.  30 tablet  2   No current facility-administered medications for this encounter.    Filed Vitals:   12/10/13 1501  BP: 140/71  Pulse: 78  Weight: 155 lb 8 oz (70.534 kg)  SpO2: 98%    PHYSICAL EXAM:  General:  Elderly well appearing. No resp difficulty HEENT: normal Neck: supple. JVP flat. Carotids 2+ bilaterally; no bruits. No lymphadenopathy or thryomegaly appreciated. Cor: PMI normal. Regular rate & rhythm. No rubs, gallops or murmurs. Lungs: clear Abdomen: soft, nontender, nondistended. No hepatosplenomegaly. No bruits or masses. Good bowel sounds. Extremities: no cyanosis, clubbing, rash, edema Neuro: alert & orientedx3, cranial nerves grossly intact. Moves all 4 extremities w/o difficulty. Affect pleasant.   ASSESSMENT & PLAN:  1) Chronic systolic HF: ICM, EF [id number], RV mod dilated and sys. fx severely reduced (11/2013) - Reviewed patient's discharge summary and he was just admitted for A/C HF. On admission his R heart cath showed low output. He was diuresed with lasix and repeat cath showed normal filling pressures and normal CO. Did not tolerate Imdur d/t headache. Discharge weight 152 lbs - NYHA II symptoms and volume status stable. Will continue lasix 40 PRN. If weight greater than 158 lbs take 40 mg BID, if weight less than 153 lbs hold lasix. Would like weight about 153-158 lbs. - Continue coreg 3.125 mg BID will not titrate currently with recent low output. - Will increase losartan to 50 mg q am and 25 mg q pm. Check BMET today and then in 7-10 days. - Continue hydralazine 37.5 mg TID. He is not on Imdur d/t severe headache. Continue digoxin 0.125 mg. - Will need repeat ECHO in 3 months to  reassess EF. IF it remains less than 35% will need referral for ICD. He has a RBBB. - Reinforced the need and importance of daily weights, a low sodium diet, and fluid restriction (less than 2 L a day). Instructed to call the HF  clinic if weight increases more than 3 lbs overnight or 5 lbs in a week.  2) CAD s/p CABG - Recent LHC (11/2013) severe native CAD, occlusion of SVG sequence to PLA. Continue statin, BB and ASA 3) HTN - As above will increase losartan for afterload reduction and BP.    F/U 2 weeks.  Junie Bame B 1:08 PM

## 2013-12-10 NOTE — Patient Instructions (Signed)
Doing great.  Increase your losartan to 50 mg (1 tablet) in the morning and 25 mg (1/2 tablet) in the evening.  Get labs checked at Dr. Woody Seller office 9/28-Oct 2nd and have them fax results to [phone]  Follow up in 2 weeks.  Call any issues.  Do the following things EVERYDAY: 1) Weigh yourself in the morning before breakfast. Write it down and keep it in a log. 2) Take your medicines as prescribed 3) Eat low salt foods-Limit salt (sodium) to 2000 mg per day.  4) Stay as active as you can everyday 5) Limit all fluids for the day to less than 2 liters 6)

## 2013-12-11 ENCOUNTER — Encounter: Payer: Medicare Other | Admitting: Cardiology

## 2013-12-11 ENCOUNTER — Encounter: Payer: Self-pay | Admitting: Cardiology

## 2013-12-11 DIAGNOSIS — I5022 Chronic systolic (congestive) heart failure: Secondary | ICD-10-CM | POA: Insufficient documentation

## 2013-12-11 NOTE — Progress Notes (Signed)
PPatient canceled. This encounter was created in error - please disregard.

## 2013-12-24 ENCOUNTER — Ambulatory Visit (HOSPITAL_COMMUNITY)
Admission: RE | Admit: 2013-12-24 | Discharge: 2013-12-24 | Disposition: A | Discharge status: Home / Self Care | Payer: Medicare Other | Source: Ambulatory Visit | Attending: Internal Medicine | Admitting: Internal Medicine

## 2013-12-24 ENCOUNTER — Encounter (HOSPITAL_COMMUNITY): Payer: Self-pay

## 2013-12-24 VITALS — BP 142/58 | HR 62 | Wt 156.0 lb

## 2013-12-24 DIAGNOSIS — H269 Unspecified cataract: Secondary | ICD-10-CM | POA: Insufficient documentation

## 2013-12-24 DIAGNOSIS — E785 Hyperlipidemia, unspecified: Secondary | ICD-10-CM | POA: Diagnosis not present

## 2013-12-24 DIAGNOSIS — I129 Hypertensive chronic kidney disease with stage 1 through stage 4 chronic kidney disease, or unspecified chronic kidney disease: Secondary | ICD-10-CM | POA: Insufficient documentation

## 2013-12-24 DIAGNOSIS — Z8249 Family history of ischemic heart disease and other diseases of the circulatory system: Secondary | ICD-10-CM | POA: Insufficient documentation

## 2013-12-24 DIAGNOSIS — I252 Old myocardial infarction: Secondary | ICD-10-CM | POA: Insufficient documentation

## 2013-12-24 DIAGNOSIS — I8390 Asymptomatic varicose veins of unspecified lower extremity: Secondary | ICD-10-CM | POA: Insufficient documentation

## 2013-12-24 DIAGNOSIS — Z8673 Personal history of transient ischemic attack (TIA), and cerebral infarction without residual deficits: Secondary | ICD-10-CM | POA: Insufficient documentation

## 2013-12-24 DIAGNOSIS — Z79899 Other long term (current) drug therapy: Secondary | ICD-10-CM | POA: Diagnosis not present

## 2013-12-24 DIAGNOSIS — H3530 Unspecified macular degeneration: Secondary | ICD-10-CM | POA: Diagnosis not present

## 2013-12-24 DIAGNOSIS — Z7982 Long term (current) use of aspirin: Secondary | ICD-10-CM | POA: Diagnosis not present

## 2013-12-24 DIAGNOSIS — E118 Type 2 diabetes mellitus with unspecified complications: Secondary | ICD-10-CM | POA: Diagnosis not present

## 2013-12-24 DIAGNOSIS — N183 Chronic kidney disease, stage 3 (moderate): Secondary | ICD-10-CM | POA: Insufficient documentation

## 2013-12-24 DIAGNOSIS — Z951 Presence of aortocoronary bypass graft: Secondary | ICD-10-CM | POA: Insufficient documentation

## 2013-12-24 DIAGNOSIS — I255 Ischemic cardiomyopathy: Secondary | ICD-10-CM | POA: Diagnosis not present

## 2013-12-24 DIAGNOSIS — I10 Essential (primary) hypertension: Secondary | ICD-10-CM

## 2013-12-24 DIAGNOSIS — I2510 Atherosclerotic heart disease of native coronary artery without angina pectoris: Secondary | ICD-10-CM | POA: Diagnosis not present

## 2013-12-24 DIAGNOSIS — I5022 Chronic systolic (congestive) heart failure: Secondary | ICD-10-CM | POA: Insufficient documentation

## 2013-12-24 LAB — BASIC METABOLIC PANEL
Anion gap: 9 (ref 5–15)
BUN: 32 mg/dL — ABNORMAL HIGH (ref 6–23)
CO2: 28 mEq/L (ref 19–32)
Calcium: 9.2 mg/dL (ref 8.4–10.5)
Chloride: 107 mEq/L (ref 96–112)
Creatinine, Ser: 1.51 mg/dL — ABNORMAL HIGH (ref 0.50–1.35)
GFR calc Af Amer: 49 mL/min — ABNORMAL LOW (ref >90)
GFR calc non Af Amer: 42 mL/min — ABNORMAL LOW (ref >90)
Glucose, Bld: 95 mg/dL (ref 70–99)
Potassium: 4.1 mEq/L (ref 3.7–5.3)
Sodium: 144 mEq/L (ref 137–147)

## 2013-12-24 LAB — PRO B NATRIURETIC PEPTIDE: Pro B Natriuretic peptide (BNP): 5332 pg/mL — ABNORMAL HIGH (ref 0–450)

## 2013-12-24 MED ORDER — LOSARTAN POTASSIUM 50 MG PO TABS: ORAL_TABLET | ORAL | Status: DC

## 2013-12-24 MED ORDER — HYDRALAZINE HCL 50 MG PO TABS: 50.0000 mg | ORAL_TABLET | Freq: Three times a day (TID) | ORAL | Status: DC

## 2013-12-24 NOTE — Progress Notes (Addendum)
Patient ID: Clayton Morton, male   DOB: 03-14-1933, 80 y.o.   MRN: [id number]  PCP: Dr. Woody Morton Primary Cardiologist: Dr. Domenic Morton  HPI: Clayton Morton is a 80 yo with a history of ICM, chronic systolic HF, HTN, CVA 123456, DM2, HLD and CAD s/p CABG 2005.  Scheduled for L/RHC on 11/29/13 and was admitted with low output and severe CAD. Repeat RHC showed low filling pressures and normal CO.   ECHO (11/2013): EF 10%, RV mod dilated and systolic fx severely reduced  Follow up for Heart Failure: Last visit increased losartan to 50 mg q am and 25 mg q pm which he tolerated. Waiting on labs. Doing well. Denies SOB, PND, orthopnea or CP. Taking medications as prescribed. Has needed lasix twice. Weight at home 150-152 lbs. Following a low salt diet and drinking less than 2L a day. Walking about 2 blocks a day with no issues.   Labs:  12/10/13: K+ 4.5, BUN 31, creatinine 1.46 12/18/13 (PCP office): K+ 4.0, creatinine 1.74  ROS: All systems negative except as listed in HPI, PMH and Problem List.  SH:  History   Social History  . Marital Status: Married    Spouse Name: N/A    Number of Children: N/A  . Years of Education: N/A   Occupational History  . Not on file.   Social History Main Topics  . Smoking status: Never Smoker   . Smokeless tobacco: Never Used  . Alcohol Use: No  . Drug Use: No  . Sexual Activity: Not on file   Other Topics Concern  . 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     FH:  Family History  Problem Relation Age of Onset  . Hypertension Father     CAD, CHF; deceased at 26  . Dementia Mother     deceased at age 54    Past Medical History  Diagnosis Date  . Essential hypertension, benign   . Cerebrovascular accident     Right subcortical infarct 2006  . Cataract   . Macular degeneration     Dr. [name]  . Coronary atherosclerosis of native coronary artery     1) CABG 2005 2) LHC (910/15): patent  SVG to PDA, occlusion of SVG sequence to PLA, patency of LIMA to LAD and patency of SVG to diagonal with sev stenosis at coronary anastomotic site  . Hyperlipidemia   . Varicose veins   . Diabetes mellitus, type 2   . Non-ST elevation MI (NSTEMI)     2005  . Ischemic cardiomyopathy   . Chronic systolic congestive heart failure     a) ECHO (02/2012): EF 30-35%, no obvious thrombus b) ECHO (11/2013): EF 10%, AV mildly calcified, mild MR, RV mod dialted and sys fx sev reduced, TAPSE 0.8, lateral annulus peak S velocity 5.4 c) RHC (11/29/13): RA 15, RV: 65/19, PA 49%, CO/CI: 3.25 / 1.7 d) RHC (12/03/13): RA 1, RV 25/1/2, PA 27/10 (16), PCWP 6, Fick CO/CI: 5.3 / 2.9, PVR 1.9 WU, PA sat 72 and 74%     Current Outpatient Prescriptions  Medication Sig Dispense Refill  . aspirin 81 MG tablet Take 81 mg by mouth daily.      [name] atorvastatin (LIPITOR) 80 MG tablet Take 1 tablet (80 mg total) by mouth daily at 6 PM.  30 tablet  6  . carvedilol (COREG) 3.125 MG tablet Take 1 tablet (3.125  mg total) by mouth 2 (two) times daily.  60 tablet  6  . Cyanocobalamin (VITAMIN B-12) 1000 MCG SUBL Place under the tongue daily.      . digoxin (LANOXIN) 0.125 MG tablet Take 1 tablet (0.125 mg total) by mouth daily.  30 tablet  3  . furosemide (LASIX) 40 MG tablet Take 1 tablet (40 mg total) by mouth daily. Or as instructed by provider  60 tablet  3  . GLIPIZIDE XL 10 MG 24 hr tablet Take 10 mg by mouth daily.       . hydrALAZINE (APRESOLINE) 25 MG tablet Take 1.5 tablets (37.5 mg total) by mouth 3 (three) times daily.  135 tablet  3  . levothyroxine (SYNTHROID, LEVOTHROID) 50 MCG tablet Take 50 mcg by mouth daily.      [name] losartan (COZAAR) 50 MG tablet 1 tablet (50mg ) in am and 1/2 (25mg ) tablet in pm  30 tablet  3  . metFORMIN (GLUCOPHAGE) 500 MG tablet Take 1 tablet (500 mg total) by mouth daily with breakfast.      . Omega-3 Fatty Acids (FISH OIL) 1000 MG CAPS Take 1 capsule by mouth 2 (two) times daily.      .  potassium chloride SA (K-DUR,KLOR-CON) 20 MEQ tablet Do not take if you do not take lasix.  30 tablet  2   No current facility-administered medications for this encounter.    Filed Vitals:   12/24/13 1407  BP: 142/58  Pulse: 62  Weight: 156 lb (70.761 kg)  SpO2: 99%    PHYSICAL EXAM: General:  Elderly well appearing. No resp difficulty, wife present  HEENT: normal Neck: supple. JVP flat. Carotids 2+ bilaterally; no bruits. No lymphadenopathy or thryomegaly appreciated. Cor: PMI normal. Regular rate & rhythm. No rubs, gallops or murmurs. Lungs: clear Abdomen: soft, nontender, nondistended. No hepatosplenomegaly. No bruits or masses. Good bowel sounds. Extremities: no cyanosis, clubbing, rash, edema Neuro: alert & orientedx3, cranial nerves grossly intact. Moves all 4 extremities w/o difficulty. Affect pleasant.   ASSESSMENT & PLAN:  1) Chronic systolic HF: ICM, EF [id number], RV mod dilated and sys. fx severely reduced (11/2013) - NYHA II symptoms and volume status stable. Will continue lasix 40 mg PRN. If weight greater than 158 lbs take 40 mg BID, if weight less than 153 lbs hold lasix. Would like weight about 153-158 lbs.Check BMET and pro-NP today. - Continue coreg 3.125 mg BID, losartan 50 mg q am and 25 mg q pm and digoxin 0.125 mg daily (Dig level 0.7, 11/2013) - Will increase hydralazine to 50 mg TID for afterload reduction. He is not on Imdur d/t severe headaches.  - Will need repeat ECHO in 3 months to reassess EF. IF it remains less than 35% will need referral for ICD. He has a RBBB. - Reinforced the need and importance of daily weights, a low sodium diet, and fluid restriction (less than 2 L a day). Instructed to call the HF clinic if weight increases more than 3 lbs overnight or 5 lbs in a week.  2) CAD s/p CABG - Recent LHC (11/2013) severe native CAD, occlusion of SVG sequence to PLA. Continue statin, BB and ASA 3) HTN - As above will increase hydralazine to 50 mg TID for  afterload reduction. 4) CKD, stage III -baseline creatinine 1.4-1.6  F/U 1 month  Junie Bame B NP-C 2:19 PM

## 2013-12-24 NOTE — Patient Instructions (Signed)
Doing great.  Will increase hydralazine to 50 mg (2 tablets) three times a day. When you run out of your 25 mg tablets then your new prescription will be 50 mg tablets and you will take 1 tablet three times a day.  Sent new prescription for losartan.   Call any issues.  Follow up in 1 month  Do the following things EVERYDAY: 1) Weigh yourself in the morning before breakfast. Write it down and keep it in a log. 2) Take your medicines as prescribed 3) Eat low salt foods-Limit salt (sodium) to 2000 mg per day.  4) Stay as active as you can everyday 5) Limit all fluids for the day to less than 2 liters 6) ]

## 2014-01-24 ENCOUNTER — Encounter (HOSPITAL_COMMUNITY): Payer: Self-pay

## 2014-01-24 ENCOUNTER — Ambulatory Visit (HOSPITAL_COMMUNITY)
Admission: RE | Admit: 2014-01-24 | Discharge: 2014-01-24 | Disposition: A | Discharge status: Home / Self Care | Payer: Medicare Other | Source: Ambulatory Visit | Attending: Internal Medicine | Admitting: Internal Medicine

## 2014-01-24 VITALS — BP 122/81 | HR 68 | Resp 18 | Wt 156.2 lb

## 2014-01-24 DIAGNOSIS — Z951 Presence of aortocoronary bypass graft: Secondary | ICD-10-CM | POA: Diagnosis not present

## 2014-01-24 DIAGNOSIS — I5022 Chronic systolic (congestive) heart failure: Secondary | ICD-10-CM | POA: Diagnosis not present

## 2014-01-24 DIAGNOSIS — I10 Essential (primary) hypertension: Secondary | ICD-10-CM | POA: Insufficient documentation

## 2014-01-24 DIAGNOSIS — I2510 Atherosclerotic heart disease of native coronary artery without angina pectoris: Secondary | ICD-10-CM | POA: Diagnosis not present

## 2014-01-24 DIAGNOSIS — N183 Chronic kidney disease, stage 3 (moderate): Secondary | ICD-10-CM | POA: Insufficient documentation

## 2014-01-24 DIAGNOSIS — I255 Ischemic cardiomyopathy: Secondary | ICD-10-CM

## 2014-01-24 LAB — BASIC METABOLIC PANEL
Anion gap: 11 (ref 5–15)
BUN: 28 mg/dL — ABNORMAL HIGH (ref 6–23)
CO2: 27 mEq/L (ref 19–32)
Calcium: 8.9 mg/dL (ref 8.4–10.5)
Chloride: 105 mEq/L (ref 96–112)
Creatinine, Ser: 1.64 mg/dL — ABNORMAL HIGH (ref 0.50–1.35)
GFR calc Af Amer: 45 mL/min — ABNORMAL LOW (ref >90)
GFR calc non Af Amer: 38 mL/min — ABNORMAL LOW (ref >90)
Glucose, Bld: 143 mg/dL — ABNORMAL HIGH (ref 70–99)
Potassium: 3.9 mEq/L (ref 3.7–5.3)
Sodium: 143 mEq/L (ref 137–147)

## 2014-01-24 MED ORDER — SACUBITRIL-VALSARTAN 49-51 MG PO TABS: 1.0000 | ORAL_TABLET | Freq: Two times a day (BID) | ORAL | Status: DC

## 2014-01-24 NOTE — Patient Instructions (Addendum)
Doing great.  Stop losartan.  Start Entresto 49-51 (1 tablet) twice a day starting on Saturday. Call me with any issues or if it is expensive.  Go to Dr. Woody Seller to get labs in 2 weeks.  Happy birthday and have a great Thanksgiving.   Follow up in 1 month with ECHO.  Do the following things EVERYDAY: 1) Weigh yourself in the morning before breakfast. Write it down and keep it in a log. 2) Take your medicines as prescribed 3) Eat low salt foods-Limit salt (sodium) to 2000 mg per day.  4) Stay as active as you can everyday 5) Limit all fluids for the day to less than 2 liters 6)

## 2014-01-24 NOTE — Progress Notes (Addendum)
Patient ID: Clayton Morton, male   DOB: [birth year], 80 y.o.   MRN: [id number]   PCP: Clayton Morton Primary Cardiologist: Clayton Morton  HPI: Clayton Morton is a 80 yo with a history of ICM, chronic systolic HF, HTN, CVA 123456, DM2, HLD and CAD s/p CABG 2005.  Scheduled for L/RHC on 11/29/13 and was admitted with low output and severe CAD. Repeat RHC showed low filling pressures and normal CO.   ECHO (11/2013): EF 10%, RV mod dilated and systolic fx severely reduced  Follow up for Heart Failure: Last visit increased hydralazine to 50 mg TID which he tolerated. Doing well. Denies SOB, orthopnea, PND, CP or edema. Taking medications as prescribed. Weight 150-153 lbs. Following a low salt diet and drinking less than 2L a day. Able to walk 2-3 blocks everyday. He is staying on the go all the time and has no issues keeping up.   Labs:  12/10/13: K+ 4.5, BUN 31, creatinine 1.46 12/18/13 (PCP office): K+ 4.0, creatinine 1.74 12/24/13 K+ 4.1, creatinine 1.51, pro-BNP 5332  ROS: All systems negative except as listed in HPI, PMH and Problem List.  SH:  History   Social History  . Marital Status: Married    Spouse Name: N/A    Number of Children: N/A  . Years of Education: N/A   Occupational History  . Not on file.   Social History Main Topics  . Smoking status: Never Smoker   . Smokeless tobacco: Never Used  . Alcohol Use: No  . Drug Use: No  . Sexual Activity: Not on file   Other Topics Concern  . 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 Lives in [place] with wife.     FH:  Family History  Problem Relation Age of Onset  . Hypertension Father     CAD, CHF; deceased at 64  . Dementia Mother     deceased at age 51    Past Medical History  Diagnosis Date  . Essential hypertension, benign   . Cerebrovascular accident     Right subcortical infarct 2006  . Cataract   . Macular degeneration     Clayton Morton  . Coronary atherosclerosis of native coronary  artery     1) CABG 2005 2) LHC (910/15): patent SVG to PDA, occlusion of SVG sequence to PLA, patency of LIMA to LAD and patency of SVG to diagonal with sev stenosis at coronary anastomotic site  . Hyperlipidemia   . Varicose veins   . Diabetes mellitus, type 2   . Non-ST elevation MI (NSTEMI)     2005  . Ischemic cardiomyopathy   . Chronic systolic congestive heart failure     a) ECHO (02/2012): EF 30-35%, no obvious thrombus b) ECHO (11/2013): EF 10%, AV mildly calcified, mild MR, RV mod dialted and sys fx sev reduced, TAPSE 0.8, lateral annulus peak S velocity 5.4 c) RHC (11/29/13): RA 15, RV: 65/19, PA 49%, CO/CI: 3.25 / 1.7 d) RHC (12/03/13): RA 1, RV 25/1/2, PA 27/10 (16), PCWP 6, Fick CO/CI: 5.3 / 2.9, PVR 1.9 WU, PA sat 72 and 74%     Current Outpatient Prescriptions  Medication Sig Dispense Refill  . aspirin 81 MG tablet Take 81 mg by mouth daily.    [name] atorvastatin (LIPITOR) 80 MG tablet Take 1 tablet (80 mg total) by mouth daily at 6 PM. 30 tablet 6  . carvedilol (COREG) 3.125 MG tablet Take 1  tablet (3.125 mg total) by mouth 2 (two) times daily. 60 tablet 6  . Cyanocobalamin (VITAMIN B-12) 1000 MCG SUBL Place under the tongue daily.    . digoxin (LANOXIN) 0.125 MG tablet Take 1 tablet (0.125 mg total) by mouth daily. 30 tablet 3  . furosemide (LASIX) 40 MG tablet Take 1 tablet (40 mg total) by mouth daily. Or as instructed by provider 60 tablet 3  . GLIPIZIDE XL 10 MG 24 hr tablet Take 10 mg by mouth daily.     . hydrALAZINE (APRESOLINE) 50 MG tablet Take 1 tablet (50 mg total) by mouth 3 (three) times daily. 90 tablet 3  . levothyroxine (SYNTHROID, LEVOTHROID) 50 MCG tablet Take 50 mcg by mouth daily.    [name] losartan (COZAAR) 50 MG tablet 1 tablet (50mg ) in am and 1/2 (25mg ) tablet in pm 45 tablet 3  . metFORMIN (GLUCOPHAGE) 500 MG tablet Take 1 tablet (500 mg total) by mouth daily with breakfast.    . Omega-3 Fatty Acids (FISH OIL) 1000 MG CAPS Take 1 capsule by mouth 2 (two) times  daily.    . potassium chloride SA (K-DUR,KLOR-CON) 20 MEQ tablet Do not take if you do not take lasix. 30 tablet 2   No current facility-administered medications for this encounter.    Filed Vitals:   01/24/14 1409  BP: 122/81  Pulse: 68  Resp: 18  Weight: 156 lb 4 oz (70.875 kg)  SpO2: 98%    PHYSICAL EXAM: General:  Elderly well appearing. No resp difficulty, wife present  HEENT: normal Neck: supple. JVP flat. Carotids 2+ bilaterally; no bruits. No lymphadenopathy or thryomegaly appreciated. Cor: PMI normal. SB & regular rhythm. No rubs, gallops or murmurs. Lungs: clear Abdomen: soft, nontender, nondistended. No hepatosplenomegaly. No bruits or masses. Good bowel sounds. Extremities: no cyanosis, clubbing, rash, edema Neuro: alert & orientedx3, cranial nerves grossly intact. Moves all 4 extremities w/o difficulty. Affect pleasant.  EKG: SB with 1 degree block occasional PVC, RBBB  ASSESSMENT & PLAN:  1) Chronic systolic HF: ICM, EF [id number], RV mod dilated and sys. fx severely reduced (11/2013) - NYHA II symptoms and volume status stable. Will continue lasix 40 mg PRN. If weight greater than 158 lbs take 40 mg BID, if weight less than 153 lbs hold lasix. Would like weight about 153-158 lbs.Check BMET today - Stop coreg with bradycardia.  - Will stop losartan. Start Entresto 49-51 (1 tablet) BID. Told to call if any dizziness. Check BMET in 7-10 days.  - Continue digoxin 0.125 mg daily and hydralazine 50 mg TID. He is not on Imdur d/t severe headaches.  - Repeat Echo next visit. IF it remains less than 35% will need referral for ICD. He has a RBBB. - Reinforced the need and importance of daily weights, a low sodium diet, and fluid restriction (less than 2 L a day). Instructed to call the HF clinic if weight increases more than 3 lbs overnight or 5 lbs in a week.  2) CAD s/p CABG - Recent LHC (11/2013) severe native CAD, occlusion of SVG sequence to PLA. Continue statin and ASA 3)  HTN - stable. As above stop coreg and losartan and start Entresto 49-51 BID.  4) CKD, stage III -baseline creatinine 1.4-1.6. Check BMET today  F/U 1 month with ECHO Junie Bame B NP-C 2:15 PM

## 2014-01-25 ENCOUNTER — Telehealth (HOSPITAL_COMMUNITY): Payer: Self-pay | Admitting: Vascular Surgery

## 2014-01-25 NOTE — Telephone Encounter (Signed)
Per Junie Bame, NP ok to stay off Losartan and start Jackquline [place],  Pt's wife aware

## 2014-01-25 NOTE — Telephone Encounter (Signed)
[  pt wife called the pharmacy can not get Entrusto until Monday he is suppose to start it on Friday.. Pt wants to know if that's ok to start on Monday

## 2014-01-25 NOTE — Addendum Note (Signed)
Encounter addended by: Georga Kaufmann, CCT on: 01/25/2014  9:03 AM<BR>     Documentation filed: Charges VN

## 2014-01-28 NOTE — Addendum Note (Signed)
Encounter addended by: Rande Brunt, NP on: 01/28/2014  9:51 AM<BR>     Documentation filed: Notes Section

## 2014-01-31 ENCOUNTER — Other Ambulatory Visit (HOSPITAL_COMMUNITY): Payer: Self-pay

## 2014-01-31 ENCOUNTER — Telehealth (HOSPITAL_COMMUNITY): Payer: Self-pay | Admitting: Cardiology

## 2014-01-31 MED ORDER — LOSARTAN POTASSIUM 50 MG PO TABS: 50.0000 mg | ORAL_TABLET | Freq: Every day | ORAL | Status: DC

## 2014-01-31 NOTE — Telephone Encounter (Signed)
Called with dizziness, headache and heaviness in hands since starting Entresto. Discussed with Dr. Haroldine Laws and was considering cutting back to lower dose but he thinks just to change back to losartan 50 mg daily. Talked to wife and she will discontinue entresto and start back losartan 50 mg daily.  Junie Bame B 4:09 PM

## 2014-01-31 NOTE — Telephone Encounter (Signed)
Wife called with concerns regarding increased dizziness, headache "feels like a rubberband is stuck around head", and heaviness in hands Pt reports sxs started after 5 doses of new med entresto.  sxs reviewed with Junie Bame, NP May need to decrease entresto if pt is agreeable NP will follow up with family

## 2014-02-05 ENCOUNTER — Other Ambulatory Visit (HOSPITAL_COMMUNITY): Payer: Self-pay | Admitting: Anesthesiology

## 2014-02-06 LAB — BASIC METABOLIC PANEL
BUN/Creatinine Ratio: 17 (ref 10–22)
BUN: 32 mg/dL — ABNORMAL HIGH (ref 8–27)
CO2: 24 mmol/L (ref 18–29)
Calcium: 9.1 mg/dL (ref 8.6–10.2)
Chloride: 105 mmol/L (ref 97–108)
Creatinine, Ser: 1.83 mg/dL — ABNORMAL HIGH (ref 0.76–1.27)
GFR calc Af Amer: 40 mL/min/{1.73_m2} — ABNORMAL LOW (ref >59)
GFR calc non Af Amer: 35 mL/min/{1.73_m2} — ABNORMAL LOW (ref >59)
Glucose: 63 mg/dL — ABNORMAL LOW (ref 65–99)
Potassium: 3.8 mmol/L (ref 3.5–5.2)
Sodium: 148 mmol/L — ABNORMAL HIGH (ref 134–144)

## 2014-02-08 ENCOUNTER — Telehealth (HOSPITAL_COMMUNITY): Payer: Self-pay | Admitting: Anesthesiology

## 2014-02-08 MED ORDER — LOSARTAN POTASSIUM 50 MG PO TABS: 25.0000 mg | ORAL_TABLET | Freq: Every day | ORAL | Status: DC

## 2014-02-08 NOTE — Telephone Encounter (Signed)
Kidney function elevated. Not taking lasix currently. Will cut back losartan to 25 mg daily. Will repeat labs on 11/30 or 12/1. Will send Rx to patient.   Junie Bame B NP-C 12:43 PM

## 2014-02-18 ENCOUNTER — Telehealth (HOSPITAL_COMMUNITY): Payer: Self-pay | Admitting: Surgery

## 2014-02-18 NOTE — Telephone Encounter (Signed)
Ms. Mccullar called asking about Clayton Morton prescription that was supposed to be sent from the clinic for labwork to be done to recheck his Bmet for this week.  Per Deatra Canter it has already been mailed.  She will double check and resend if necessary.

## 2014-02-21 ENCOUNTER — Other Ambulatory Visit (HOSPITAL_COMMUNITY): Payer: Self-pay | Admitting: Anesthesiology

## 2014-02-22 LAB — BASIC METABOLIC PANEL
BUN/Creatinine Ratio: 19 (ref 10–22)
BUN: 32 mg/dL — ABNORMAL HIGH (ref 8–27)
CO2: 26 mmol/L (ref 18–29)
Calcium: 9.4 mg/dL (ref 8.6–10.2)
Chloride: 104 mmol/L (ref 97–108)
Creatinine, Ser: 1.71 mg/dL — ABNORMAL HIGH (ref 0.76–1.27)
GFR calc Af Amer: 43 mL/min/{1.73_m2} — ABNORMAL LOW (ref >59)
GFR calc non Af Amer: 37 mL/min/{1.73_m2} — ABNORMAL LOW (ref >59)
Glucose: 70 mg/dL (ref 65–99)
Potassium: 3.9 mmol/L (ref 3.5–5.2)
Sodium: 146 mmol/L — ABNORMAL HIGH (ref 134–144)

## 2014-02-26 ENCOUNTER — Ambulatory Visit (HOSPITAL_COMMUNITY)
Admission: RE | Admit: 2014-02-26 | Discharge: 2014-02-26 | Disposition: A | Discharge status: Home / Self Care | Payer: Medicare Other | Source: Ambulatory Visit | Attending: Internal Medicine | Admitting: Internal Medicine

## 2014-02-26 ENCOUNTER — Ambulatory Visit (HOSPITAL_BASED_OUTPATIENT_CLINIC_OR_DEPARTMENT_OTHER)
Admission: RE | Admit: 2014-02-26 | Discharge: 2014-02-26 | Disposition: A | Discharge status: Home / Self Care | Payer: Medicare Other | Source: Ambulatory Visit | Attending: Cardiology | Admitting: Cardiology

## 2014-02-26 ENCOUNTER — Encounter (HOSPITAL_COMMUNITY): Payer: Self-pay

## 2014-02-26 VITALS — BP 158/64 | HR 73 | Wt 158.8 lb

## 2014-02-26 DIAGNOSIS — N183 Chronic kidney disease, stage 3 unspecified: Secondary | ICD-10-CM

## 2014-02-26 DIAGNOSIS — I5023 Acute on chronic systolic (congestive) heart failure: Secondary | ICD-10-CM

## 2014-02-26 DIAGNOSIS — I5022 Chronic systolic (congestive) heart failure: Secondary | ICD-10-CM

## 2014-02-26 DIAGNOSIS — I10 Essential (primary) hypertension: Secondary | ICD-10-CM | POA: Diagnosis not present

## 2014-02-26 DIAGNOSIS — N189 Chronic kidney disease, unspecified: Secondary | ICD-10-CM | POA: Insufficient documentation

## 2014-02-26 DIAGNOSIS — Z951 Presence of aortocoronary bypass graft: Secondary | ICD-10-CM | POA: Diagnosis not present

## 2014-02-26 DIAGNOSIS — I2510 Atherosclerotic heart disease of native coronary artery without angina pectoris: Secondary | ICD-10-CM | POA: Insufficient documentation

## 2014-02-26 DIAGNOSIS — I359 Nonrheumatic aortic valve disorder, unspecified: Secondary | ICD-10-CM

## 2014-02-26 MED ORDER — LOSARTAN POTASSIUM 50 MG PO TABS: 50.0000 mg | ORAL_TABLET | Freq: Every day | ORAL | Status: DC

## 2014-02-26 NOTE — Progress Notes (Signed)
Echo Lab  2D Echocardiogram completed.  Sumedh Shinsato L Jazzalynn Rhudy, RDCS 02/26/2014 1:34 PM

## 2014-02-26 NOTE — Addendum Note (Signed)
Encounter addended by: Micki Riley, RN on: 02/26/2014  2:53 PM<BR>     Documentation filed: Visit Diagnoses, Patient Instructions Section, Dx Association, Orders

## 2014-02-26 NOTE — Patient Instructions (Signed)
Increase Losartan to 50 mg daily You have been referred to Electrophysiology Physician--they will contact you for an appt Please return on Monday for labwork. We will schedule an appt. for you to follow up with [place] in 2 months

## 2014-02-26 NOTE — Progress Notes (Signed)
Patient ID: Clayton Morton, male   DOB: Dec 19, 1932, 80 y.o.   MRN: [id number]   PCP: Dr. Woody Seller Primary Cardiologist: [name]  HPI: Mr. Bork is a 80 yo with a history of ICM, chronic systolic HF, HTN, CVA 123456, DM2, HLD and CAD s/p CABG 2005.  Scheduled for L/RHC on 11/29/13 and was admitted with low output and severe CAD. Repeat RHC showed low filling pressures and normal CO.   ECHO (11/2013): EF 10%, RV mod dilated and systolic fx severely reduced  Follow up for Heart Failure: Last visit losartan switched to Entresto but unable to tolerate due to severe hypotension. Switched back to losartan. Losartan dose decreased to 35 daily last week due to rising creatinine. Doing well. Denies SOB, orthopnea, PND, CP or edema. Feels he has much more energy. Taking medications as prescribed. Takes lasix just when he needs to - 1-2x/week. Weight 150-153 lbs. Following a low salt diet and drinking less than 2L a day. Able to walk 2-3 blocks everyday. No on carvedilol due to severe bradycardia  Echo 02/26/14: EF 30-35% inferior AK mild RV dysfunction  Labs:  12/10/13: K+ 4.5, BUN 31, creatinine 1.46 12/18/13 (PCP office): K+ 4.0, creatinine 1.74 12/24/13 K+ 4.1, creatinine 1.51, pro-BNP 5332 02/05/14 K+ 3.9 creatinine 1.83 02/21/14 K+ 3.9 creatinine 1.71    ROS: All systems negative except as listed in HPI, PMH and Problem List.  SH:  History   Social History  . Marital Status: Married    Spouse Name: N/A    Number of Children: N/A  . Years of Education: N/A   Occupational History  . Not on file.   Social History Main Topics  . Smoking status: Never Smoker   . Smokeless tobacco: Never Used  . Alcohol Use: No  . Drug Use: No  . Sexual Activity: Not on file   Other Topics Concern  . 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     FH:  Family History  Problem Relation Age of Onset  . Hypertension Father     CAD, CHF;  deceased at 61  . Dementia Mother     deceased at age 53    Past Medical History  Diagnosis Date  . Essential hypertension, benign   . Cerebrovascular accident     Right subcortical infarct 2006  . Cataract   . Macular degeneration     Dr. [name]  . Coronary atherosclerosis of native coronary artery     1) CABG 2005 2) LHC (910/15): patent SVG to PDA, occlusion of SVG sequence to PLA, patency of LIMA to LAD and patency of SVG to diagonal with sev stenosis at coronary anastomotic site  . Hyperlipidemia   . Varicose veins   . Diabetes mellitus, type 2   . Non-ST elevation MI (NSTEMI)     2005  . Ischemic cardiomyopathy   . Chronic systolic congestive heart failure     a) ECHO (02/2012): EF 30-35%, no obvious thrombus b) ECHO (11/2013): EF 10%, AV mildly calcified, mild MR, RV mod dialted and sys fx sev reduced, TAPSE 0.8, lateral annulus peak S velocity 5.4 c) RHC (11/29/13): RA 15, RV: 65/19, PA 49%, CO/CI: 3.25 / 1.7 d) RHC (12/03/13): RA 1, RV 25/1/2, PA 27/10 (16), PCWP 6, Fick CO/CI: 5.3 / 2.9, PVR 1.9 WU, PA sat 72 and 74%     Current Outpatient Prescriptions  Medication Sig  Dispense Refill  . aspirin 81 MG tablet Take 81 mg by mouth daily.    [name] atorvastatin (LIPITOR) 80 MG tablet Take 1 tablet (80 mg total) by mouth daily at 6 PM. 30 tablet 6  . Cyanocobalamin (VITAMIN B-12) 1000 MCG SUBL Place under the tongue daily.    . digoxin (LANOXIN) 0.125 MG tablet Take 1 tablet (0.125 mg total) by mouth daily. 30 tablet 3  . furosemide (LASIX) 40 MG tablet Take 1 tablet (40 mg total) by mouth daily. Or as instructed by provider 60 tablet 3  . GLIPIZIDE XL 10 MG 24 hr tablet Take 10 mg by mouth daily.     . hydrALAZINE (APRESOLINE) 50 MG tablet Take 1 tablet (50 mg total) by mouth 3 (three) times daily. 90 tablet 3  . levothyroxine (SYNTHROID, LEVOTHROID) 50 MCG tablet Take 50 mcg by mouth daily.    [name] losartan (COZAAR) 50 MG tablet Take 0.5 tablets (25 mg total) by mouth daily. 45 tablet  3  . metFORMIN (GLUCOPHAGE) 500 MG tablet Take 1 tablet (500 mg total) by mouth daily with breakfast.    . Omega-3 Fatty Acids (FISH OIL) 1000 MG CAPS Take 1 capsule by mouth 2 (two) times daily.    . potassium chloride SA (K-DUR,KLOR-CON) 20 MEQ tablet Do not take if you do not take lasix. 30 tablet 2   No current facility-administered medications for this encounter.    Filed Vitals:   02/26/14 1413  BP: 158/64  Pulse: 73  Weight: 158 lb 12.8 oz (72.031 kg)  SpO2: 98%    PHYSICAL EXAM: General:  Elderly well appearing. No resp difficulty, wife present  HEENT: normal Neck: supple. JVP flat. Carotids 2+ bilaterally; no bruits. No lymphadenopathy or thryomegaly appreciated. Cor: PMI normal. SB & regular rhythm. No rubs, gallops or murmurs. Lungs: clear Abdomen: soft, nontender, nondistended. No hepatosplenomegaly. No bruits or masses. Good bowel sounds. Extremities: no cyanosis, clubbing, rash, edema Neuro: alert & orientedx3, cranial nerves grossly intact. Moves all 4 extremities w/o difficulty. Affect pleasant.  EKG: SB with 1 degree block occasional PVC, RBBB  ASSESSMENT & PLAN:  1) Chronic systolic HF: ICM, EF [id number], RV mod dilated and sys. fx severely reduced (11/2013) - NYHA II symptoms and volume status stable. Will continue lasix 40 mg PRN. If weight greater than 153 lbs take 40 mg, if weight less than 153 lbs hold lasix. Would like weight about 151-153 lbs. - Off coreg due to bradycardia.  - Will increase losartan to 50 mg daily and follow renal function closely - Continue digoxin 0.125 mg daily and hydralazine 50 mg TID. He is not on Imdur d/t severe headaches.  - Preliminary EF 30-35% with inferior AK. Will await formal echo read. We discussed ICD. He is interested and will refer to EP. - Reinforced the need and importance of daily weights, a low sodium diet, and fluid restriction (less than 2 L a day). Instructed to call the HF clinic if weight increases more than 3 lbs  overnight or 5 lbs in a week.  2) CAD s/p CABG - Recent LHC (11/2013) severe native CAD, occlusion of SVG sequence to PLA. Continue statin and ASA 3) HTN - remains elevated. Increase losartan to 50 daily.  4) CKD, stage III -baseline creatinine 1.4-1.6. stable  F/U 2 months. Will check BMET and digoxin level next week.   Glori Bickers MD  2:27 PM

## 2014-02-28 ENCOUNTER — Encounter (HOSPITAL_COMMUNITY): Payer: Self-pay | Admitting: Cardiovascular Disease

## 2014-03-12 ENCOUNTER — Other Ambulatory Visit (HOSPITAL_COMMUNITY): Payer: Self-pay | Admitting: Internal Medicine

## 2014-03-13 LAB — BASIC METABOLIC PANEL
BUN/Creatinine Ratio: 15 (ref 10–22)
BUN: 28 mg/dL — ABNORMAL HIGH (ref 8–27)
CO2: 27 mmol/L (ref 18–29)
Calcium: 9.2 mg/dL (ref 8.6–10.2)
Chloride: 106 mmol/L (ref 97–108)
Creatinine, Ser: 1.86 mg/dL — ABNORMAL HIGH (ref 0.76–1.27)
GFR calc Af Amer: 39 mL/min/{1.73_m2} — ABNORMAL LOW (ref >59)
GFR calc non Af Amer: 34 mL/min/{1.73_m2} — ABNORMAL LOW (ref >59)
Glucose: 58 mg/dL — ABNORMAL LOW (ref 65–99)
Potassium: 4.1 mmol/L (ref 3.5–5.2)
Sodium: 146 mmol/L — ABNORMAL HIGH (ref 134–144)

## 2014-03-13 LAB — DIGOXIN LEVEL: Digoxin Level: 0.5 ng/mL — ABNORMAL LOW (ref 0.9–2.0)

## 2014-03-28 ENCOUNTER — Encounter: Payer: Self-pay | Admitting: Internal Medicine

## 2014-03-28 ENCOUNTER — Ambulatory Visit (INDEPENDENT_AMBULATORY_CARE_PROVIDER_SITE_OTHER): Payer: Medicare Other | Admitting: Internal Medicine

## 2014-03-28 VITALS — BP 118/60 | HR 74 | Ht 68.0 in | Wt 156.6 lb

## 2014-03-28 DIAGNOSIS — I5022 Chronic systolic (congestive) heart failure: Secondary | ICD-10-CM

## 2014-03-28 MED ORDER — CARVEDILOL 3.125 MG PO TABS: 3.1250 mg | ORAL_TABLET | Freq: Two times a day (BID) | ORAL | Status: DC

## 2014-03-28 NOTE — Progress Notes (Signed)
ELECTROPHYSIOLOGY CONSULT NOTE  Patient ID: Clayton Morton, MRN: [id number], DOB/AGE: July 03, 1932 81 y.o. Admit date: (Not on file) Date of Consult: 03/28/2014  Primary Physician: Glenda Chroman., MD Primary Cardiologist: CHF  Chief Complaint:     HPI Clayton Morton is a 81 y.o. male  Referred for consideration of an ICD for primary prevention.  He is a history of ischemic heart disease with prior bypass surgery with most recent catheterization 9/15 demonstrated severe native disease or disease and occlusion of his graft was PLA. Ejection fraction at that time by echocardiogram was 10%. Notably, in 2014 been about 35%. . Most recently was checked again 12/15 and there've been significant interval improvement to 50-35%. Left atrial size is markedly dilated with dilatation and hypokinesis of the RV as well with pulmonary hypertension  He has not tolerated beta blockers because of bradycardia; he has not been on nitrates because of headaches but he does take hydralazine.   He had not had symptoms that he recalls when his beta blockers were discontinued. Right now he thinks that his functional status has been quite good.    there is no history of syncope or palpitations. He has no recollection of being told he has irregular heartbeats.     Past Medical History  Diagnosis Date  . Essential hypertension, benign   . Cerebrovascular accident     Right subcortical infarct 2006  . Cataract   . Macular degeneration     Dr. [name]  . Coronary atherosclerosis of native coronary artery     1) CABG 2005 2) LHC (910/15): patent SVG to PDA, occlusion of SVG sequence to PLA, patency of LIMA to LAD and patency of SVG to diagonal with sev stenosis at coronary anastomotic site  . Hyperlipidemia   . Varicose veins   . Diabetes mellitus, type 2   . Non-ST elevation MI (NSTEMI)     2005  . Ischemic cardiomyopathy   . Chronic systolic congestive heart failure     a) ECHO (02/2012): EF 30-35%, no  obvious thrombus b) ECHO (11/2013): EF 10%, AV mildly calcified, mild MR, RV mod dialted and sys fx sev reduced, TAPSE 0.8, lateral annulus peak S velocity 5.4 c) RHC (11/29/13): RA 15, RV: 65/19, PA 49%, CO/CI: 3.25 / 1.7 d) RHC (12/03/13): RA 1, RV 25/1/2, PA 27/10 (16), PCWP 6, Fick CO/CI: 5.3 / 2.9, PVR 1.9 WU, PA sat 72 and 74%       Surgical History:  Past Surgical History  Procedure Laterality Date  . Coronary artery bypass graft  2005    LIMA to LAD, SVG diagonal, SVG to PDA and PLA  . Left and right heart catheterization with coronary/graft angiogram N/A 11/29/2013    Procedure: LEFT AND RIGHT HEART CATHETERIZATION WITH Beatrix Fetters;  Surgeon: Blane Ohara, MD;  Location: [facility] CATH LAB;  Service: Cardiovascular;  Laterality: N/A;  . Right heart catheterization N/A 12/03/2013    Procedure: RIGHT HEART CATH;  Surgeon: Jolaine Artist, MD;  Location: [facility] CATH LAB;  Service: Cardiovascular;  Laterality: N/A;     Home Meds: Prior to Admission medications   Medication Sig Start Date End Date Taking? Authorizing Provider  aspirin 81 MG tablet Take 81 mg by mouth daily.    Historical Provider, MD  atorvastatin (LIPITOR) 80 MG tablet Take 1 tablet (80 mg total) by mouth daily at 6 PM. 12/03/13   Rande Brunt, NP  Cyanocobalamin (VITAMIN B-12) 1000 MCG SUBL Place under the  tongue daily.    Historical Provider, MD  digoxin (LANOXIN) 0.125 MG tablet Take 1 tablet (0.125 mg total) by mouth daily. 12/03/13   Jolaine Artist, MD  furosemide (LASIX) 40 MG tablet Take 1 tablet (40 mg total) by mouth daily. Or as instructed by provider 12/05/13 12/05/14  Rande Brunt, NP  GLIPIZIDE XL 10 MG 24 hr tablet Take 10 mg by mouth daily.  02/10/12   Historical Provider, MD  hydrALAZINE (APRESOLINE) 50 MG tablet Take 1 tablet (50 mg total) by mouth 3 (three) times daily. 12/24/13   Rande Brunt, NP  levothyroxine (SYNTHROID, LEVOTHROID) 50 MCG tablet Take 50 mcg by mouth daily.    Historical  Provider, MD  losartan (COZAAR) 50 MG tablet Take 1 tablet (50 mg total) by mouth daily. 02/26/14   Jolaine Artist, MD  metFORMIN (GLUCOPHAGE) 500 MG tablet Take 1 tablet (500 mg total) by mouth daily with breakfast. 12/05/13   Rande Brunt, NP  Omega-3 Fatty Acids (FISH OIL) 1000 MG CAPS Take 1 capsule by mouth 2 (two) times daily.    Historical Provider, MD  potassium chloride SA (K-DUR,KLOR-CON) 20 MEQ tablet Do not take if you do not take lasix. 12/03/13   Rande Brunt, NP     ti  AllDGergies:  Allergies  Allergen Reactions  . Tetracycline     Unknown reaction     History   Social History  . Marital Status: Married    Spouse Name: N/A    Number of Children: N/A  . Years of Education: N/A   Occupational History  . Not on file.   Social History Main Topics  . Smoking status: Never Smoker   . Smokeless tobacco: Never Used  . Alcohol Use: No  . Drug Use: No  . Sexual Activity: Not on file   Other Topics Concern  . 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      Family History  Problem Relation Age of Onset  . Hypertension Father     CAD, CHF; deceased at 3  . Dementia Mother     deceased at age 76     ROS:  Please see the history of present illness.     All other systems reviewed and negative.    Physical Exam: Blood pressure 118/60, pulse 74, height 5\' 8"  (1.727 m), weight 156 lb 9.6 oz (71.033 kg). General: Well developed, well nourished male in no acute distress. Head: Normocephalic, atraumatic, sclera non-icteric, no xanthomas, nares are without discharge. EENT: normal Lymph Nodes:  none Back: without scoliosis/kyphosis, no CVA tendersness Neck: Negative for carotid bruits. JVD not elevated. Lungs: Clear bilaterally to auscultation without wheezes, rales, or rhonchi. Breathing is unlabored. Heart: RRR with S1 S2. 2/ murmur , rubs, or gallops appreciated. Abdomen: Soft, non-tender,  non-distended with normoactive bowel sounds. No hepatomegaly. No rebound/guarding. No obvious abdominal masses. Msk:  Strength and tone appear normal for age. Extremities: No clubbing or cyanosis. No edema.  Distal pedal pulses are 2+ and equal bilaterally. Skin: Warm and Dry Neuro: Alert and oriented X 3. CN III-XII intact Grossly normal sensory and motor function . Psych:  Responds to questions appropriately with a normal affect.      Labs: Cardiac Enzymes No results for input(s): CKTOTAL, CKMB, TROPONINI in the last 72 hours. CBC Lab Results  Component Value Date   WBC 9.7 11/29/2013  HGB 12.9* 11/29/2013   HCT 38.5* 11/29/2013   MCV 91.2 11/29/2013   PLT 210 11/29/2013   PROTIME: No results for input(s): LABPROT, INR in the last 72 hours. Chemistry No results for input(s): NA, K, CL, CO2, BUN, CREATININE, CALCIUM, PROT, BILITOT, ALKPHOS, ALT, AST, GLUCOSE in the last 168 hours.  Invalid input(s): LABALBU Lipids Lab Results  Component Value Date   CHOL 268* 09/12/2007   HDL 42 09/12/2007   LDLCALC 190* 09/12/2007   TRIG 179* 09/12/2007   BNP PRO B NATRIURETIC PEPTIDE (BNP)  Date/Time Value Ref Range Status  12/24/2013 02:33 PM 5332.0* 0 - 450 pg/mL Final  11/26/2013 12:39 AM [id number]* 0 - 450 pg/mL Final   Miscellaneous No results found for: DDIMER  Radiology/Studies:  No results found.  EKG: Shows sinus rhythm at 77 Intervals 19/10/40   Assessment and Plan:  Ischemic Cardiomyopathy s/pCAB  CHF chronic systolic  HTN  PVCs right bundle inferior axis \ Bradycardia  Dyslipidemia  The patient has a persistent but improving ischemic cardiomyopathy. There has been significant interval change since September; it is possible that this trajectory we'll continue so that his ejection fraction would soon exceed 35%. I would suggest we wait.  In addition, carvedilol was previously discontinued because of bradycardia that he recalls as being asymptomatic. Today  his heart rate is 74 and I would suggest that we reinitiate carvedilol and see if she can take it even at low dose.  Furthermore, it is observed that he has PVCs at a burden on all echocardiogram from 20-25%. It would be worth, to potentially quantitate his PVC burden if in the event his cardiomyopathy does not continue to improve. It may well be that contributed to his cardio myopathy are the PVCs.  He is euvolemic.

## 2014-03-28 NOTE — Patient Instructions (Signed)
Your physician has recommended you make the following change in your medication:  1) START Carvedilol 3.125 mg twice per day  Dr. Caryl Comes will see you on an as needed basis.

## 2014-03-29 ENCOUNTER — Other Ambulatory Visit: Payer: Self-pay | Admitting: *Deleted

## 2014-03-29 MED ORDER — DIGOXIN 125 MCG PO TABS: 0.1250 mg | ORAL_TABLET | Freq: Every day | ORAL | Status: DC

## 2014-05-27 ENCOUNTER — Encounter (HOSPITAL_COMMUNITY): Payer: Self-pay

## 2014-05-27 ENCOUNTER — Ambulatory Visit (HOSPITAL_COMMUNITY)
Admission: RE | Admit: 2014-05-27 | Discharge: 2014-05-27 | Disposition: A | Discharge status: Home / Self Care | Payer: Medicare Other | Source: Ambulatory Visit | Attending: Cardiology | Admitting: Cardiology

## 2014-05-27 VITALS — BP 118/66 | HR 64 | Wt 162.5 lb

## 2014-05-27 DIAGNOSIS — E119 Type 2 diabetes mellitus without complications: Secondary | ICD-10-CM | POA: Insufficient documentation

## 2014-05-27 DIAGNOSIS — I255 Ischemic cardiomyopathy: Secondary | ICD-10-CM | POA: Diagnosis not present

## 2014-05-27 DIAGNOSIS — I5022 Chronic systolic (congestive) heart failure: Secondary | ICD-10-CM | POA: Diagnosis not present

## 2014-05-27 DIAGNOSIS — Z79899 Other long term (current) drug therapy: Secondary | ICD-10-CM | POA: Insufficient documentation

## 2014-05-27 DIAGNOSIS — Z951 Presence of aortocoronary bypass graft: Secondary | ICD-10-CM | POA: Insufficient documentation

## 2014-05-27 DIAGNOSIS — E785 Hyperlipidemia, unspecified: Secondary | ICD-10-CM | POA: Insufficient documentation

## 2014-05-27 DIAGNOSIS — I2510 Atherosclerotic heart disease of native coronary artery without angina pectoris: Secondary | ICD-10-CM | POA: Insufficient documentation

## 2014-05-27 DIAGNOSIS — Z7982 Long term (current) use of aspirin: Secondary | ICD-10-CM | POA: Insufficient documentation

## 2014-05-27 DIAGNOSIS — I129 Hypertensive chronic kidney disease with stage 1 through stage 4 chronic kidney disease, or unspecified chronic kidney disease: Secondary | ICD-10-CM | POA: Diagnosis not present

## 2014-05-27 DIAGNOSIS — N183 Chronic kidney disease, stage 3 unspecified: Secondary | ICD-10-CM

## 2014-05-27 DIAGNOSIS — I252 Old myocardial infarction: Secondary | ICD-10-CM | POA: Diagnosis not present

## 2014-05-27 DIAGNOSIS — Z8673 Personal history of transient ischemic attack (TIA), and cerebral infarction without residual deficits: Secondary | ICD-10-CM | POA: Diagnosis not present

## 2014-05-27 LAB — BASIC METABOLIC PANEL
Anion gap: 6 (ref 5–15)
BUN: 27 mg/dL — ABNORMAL HIGH (ref 6–23)
CO2: 31 mmol/L (ref 19–32)
Calcium: 9.3 mg/dL (ref 8.4–10.5)
Chloride: 104 mmol/L (ref 96–112)
Creatinine, Ser: 1.84 mg/dL — ABNORMAL HIGH (ref 0.50–1.35)
GFR calc Af Amer: 38 mL/min — ABNORMAL LOW (ref >90)
GFR calc non Af Amer: 33 mL/min — ABNORMAL LOW (ref >90)
Glucose, Bld: 160 mg/dL — ABNORMAL HIGH (ref 70–99)
Potassium: 3.9 mmol/L (ref 3.5–5.1)
Sodium: 141 mmol/L (ref 135–145)

## 2014-05-27 LAB — LIPID PANEL
Cholesterol: 237 mg/dL — ABNORMAL HIGH (ref 0–200)
HDL: 37 mg/dL — ABNORMAL LOW (ref >39)
LDL Cholesterol: 164 mg/dL — ABNORMAL HIGH (ref 0–99)
Total CHOL/HDL Ratio: 6.4 RATIO
Triglycerides: 181 mg/dL — ABNORMAL HIGH (ref <150)
VLDL: 36 mg/dL (ref 0–40)

## 2014-05-27 MED ORDER — CARVEDILOL 6.25 MG PO TABS: 6.2500 mg | ORAL_TABLET | Freq: Two times a day (BID) | ORAL | Status: DC

## 2014-05-27 NOTE — Progress Notes (Signed)
Patient ID: [place], male   DOB: June 18, 1932, 81 y.o.   MRN: [id number] PCP: Dr. Woody [place] Primary Cardiologist: Dr. Domenic [place]  HPI: [place] is a 81 yo with a history of ICM, chronic systolic HF, HTN, CVA 123456, DM2, HLD and CAD s/p CABG 2005.  Scheduled for L/RHC on 11/29/13 and was admitted with low output and severe CAD. Repeat RHC showed low filling pressures and normal CO.   Echo (11/2013): EF 10%, RV mod dilated and systolic fx severely reduced Echo (02/26/14): EF 30-35% inferior AK mild RV dysfunction  Losartan switched to Entresto but unable to tolerate due to severe hypotension. Switched back to losartan. He saw Dr [place] for consideration of ICD.  This was decided against for the time being given evidence for some LV functional recovery.  Dr [place] started him on Coreg. There was also concern for frequent PVCs and possible contribution of PVCs to his cardiomyopathy.    Today, [place] is doing well.  No chest pain, no exertional dyspnea.  No orthopnea or PND.  He is taking Lasix about once a week.  Some fatigue.   Labs:  12/10/13: K+ 4.5, BUN 31, creatinine 1.46 12/18/13 (PCP office): K+ 4.0, creatinine 1.74 12/24/13 K+ 4.1, creatinine 1.51, pro-BNP 5332 02/05/14 K+ 3.9 creatinine 1.83 12/15 K+ 3.9 creatinine 1.71 => 1.86, digoxin 0.5  ROS: All systems negative except as listed in HPI, PMH and Problem List.  SH:  History   Social History  . Marital Status: Married    Spouse Name: N/A  . Number of Children: N/A  . Years of Education: N/A   Occupational History  . Not on file.   Social History Main Topics  . Smoking status: Never Smoker   . Smokeless tobacco: Never Used  . Alcohol Use: No  . Drug Use: No  . Sexual Activity: Not on file   Other Topics Concern  . 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 Lives in [place] with wife.     FH:  Family History  Problem Relation Age of Onset  . Hypertension Father     CAD, CHF;  deceased at 68  . Dementia Mother     deceased at age 45    Past Medical History  Diagnosis Date  . Essential hypertension, benign   . Cerebrovascular accident     [place] 2006  . Cataract   . Macular degeneration     Dr. Zadie [place]  . Coronary atherosclerosis of native coronary artery     1) CABG 2005 2) LHC (910/15): patent SVG to PDA, occlusion of SVG sequence to PLA, patency of LIMA to LAD and patency of SVG to diagonal with sev stenosis at coronary anastomotic site  . Hyperlipidemia   . Varicose veins   . Diabetes mellitus, type 2   . Non-ST elevation MI (NSTEMI)     2005  . Ischemic cardiomyopathy   . Chronic systolic congestive heart failure     a) ECHO (02/2012): EF 30-35%, no obvious thrombus b) ECHO (11/2013): EF 10%, AV mildly calcified, mild [place], RV mod dialted and sys fx sev reduced, TAPSE 0.8, lateral annulus peak S velocity 5.4 c) RHC (11/29/13): RA 15, RV: 65/19, PA 49%, CO/CI: 3.25 / 1.7 d) RHC (12/03/13): RA 1, RV 25/1/2, PA 27/10 (16), PCWP 6, Fick CO/CI: 5.3 / 2.9, PVR 1.9 WU, PA sat 72 and 74%     Current Outpatient Prescriptions  Medication Sig Dispense Refill  . aspirin 81 MG tablet Take 81 mg by mouth daily.    [name] atorvastatin (LIPITOR) 80 MG tablet Take 1 tablet (80 mg total) by mouth daily at 6 PM. 30 tablet 6  . carvedilol (COREG) 6.25 MG tablet Take 1 tablet (6.25 mg total) by mouth 2 (two) times daily. 60 tablet 3  . Cyanocobalamin (VITAMIN B-12) 1000 MCG SUBL Place under the tongue 3 (three) times a week.     . digoxin (LANOXIN) 0.125 MG tablet Take 1 tablet (0.125 mg total) by mouth daily. 30 tablet 3  . furosemide (LASIX) 40 MG tablet Take 1 tablet (40 mg total) by mouth daily. Or as instructed by provider (Patient taking differently: Take 40 mg by mouth as needed for fluid or edema. Or as instructed by provider) 60 tablet 3  . GLIPIZIDE XL 10 MG 24 hr tablet Take 10 mg by mouth daily.     . hydrALAZINE (APRESOLINE) 50 MG tablet Take 1 tablet  (50 mg total) by mouth 3 (three) times daily. 90 tablet 3  . levothyroxine (SYNTHROID, LEVOTHROID) 50 MCG tablet Take 50 mcg by mouth daily.    [name] losartan (COZAAR) 50 MG tablet Take 1 tablet (50 mg total) by mouth daily. (Patient taking differently: Take 50 mg by mouth daily. ) 45 tablet 3  . metFORMIN (GLUCOPHAGE) 500 MG tablet Take 1 tablet (500 mg total) by mouth daily with breakfast.    . Omega-3 Fatty Acids (FISH OIL) 1000 MG CAPS Take 1 capsule by mouth 2 (two) times daily.    . potassium chloride SA (K-DUR,KLOR-CON) 20 MEQ tablet Do not take if you do not take lasix. (Patient taking differently: Take 20 mEq by mouth now. TAKE ONLY WHEN TAKING LASIX.Marland KitchenMarland Kitchen) 30 tablet 2   No current facility-administered medications for this encounter.    Filed Vitals:   05/27/14 1148  BP: 118/66  Pulse: 64  Weight: 162 lb 8 oz (73.71 kg)  SpO2: 100%    PHYSICAL EXAM: General:  Elderly well appearing. No resp difficulty, wife present  HEENT: normal Neck: supple. JVP flat. Carotids 2+ bilaterally; no bruits. No lymphadenopathy or thryomegaly appreciated. Cor: PMI normal. SB & regular rhythm. No rubs, gallops or murmurs. Lungs: clear Abdomen: soft, nontender, nondistended. No hepatosplenomegaly. No bruits or masses. Good bowel sounds. Extremities: no cyanosis, clubbing, rash, edema Neuro: alert & orientedx3, cranial nerves grossly intact. Moves all 4 extremities w/o difficulty. Affect pleasant.  ASSESSMENT & PLAN:  1) Chronic systolic HF: Ischemic cardiomyopathy.  Most recent echo in 12/15 with EF improved to 30-35%.  NYHA II symptoms and volume status stable.  - Will continue lasix 40 mg PRN. If weight greater than 153 lbs take 40 mg, if weight less than 153 lbs hold lasix. Would like weight about 151-153 lbs. - He has tolerated Coreg 3.125 mg bid.  I will increase to 6.25 mg bid.   - Continue losartan 50 mg daily.  BMET today.  - Continue digoxin 0.125 mg daily, recent level was ok.   - EF  improving, holding off on ICD for now.  Would repeat echo in 6/16.  - Frequent PVCs have been noted.  Will get holter to quantify (?are PVCs playing a role in CMP).   - Reinforced the need and importance of daily weights, a low sodium diet, and fluid restriction (less than 2 L a day). Instructed to call the HF clinic if weight increases more than 3 lbs overnight or 5 lbs  in a week.  2) CAD s/p CABG:  Last LHC (11/2013) severe native CAD, occlusion of SVG sequence to PLA. Continue statin and ASA 81 daily. Check lipids.  3) HTN:  BP not elevated. Increase losartan to 50 daily.  4) CKD, stage III:  BMET today.    Loralie Champagne  05/27/2014

## 2014-05-27 NOTE — Patient Instructions (Signed)
Will schedule you for a 24 hour Holter (Heart) Monitor at [facility].  INCREASE Carvedilol (Coreg) to 6.25mg  (1 tablet) twice daily.  Follow up 2 months.  Do the following things EVERYDAY: 1) Weigh yourself in the morning before breakfast. Write it down and keep it in a log. 2) Take your medicines as prescribed 3) Eat low salt foods-Limit salt (sodium) to 2000 mg per day.  4) Stay as active as you can everyday 5) Limit all fluids for the day to less than 2 liters

## 2014-06-03 ENCOUNTER — Other Ambulatory Visit (HOSPITAL_COMMUNITY): Payer: Self-pay | Admitting: *Deleted

## 2014-06-03 MED ORDER — HYDRALAZINE HCL 50 MG PO TABS: 50.0000 mg | ORAL_TABLET | Freq: Three times a day (TID) | ORAL | Status: DC

## 2014-06-05 ENCOUNTER — Other Ambulatory Visit (HOSPITAL_COMMUNITY): Payer: Self-pay

## 2014-06-05 ENCOUNTER — Ambulatory Visit (INDEPENDENT_AMBULATORY_CARE_PROVIDER_SITE_OTHER): Payer: Medicare Other | Admitting: *Deleted

## 2014-06-05 DIAGNOSIS — I5022 Chronic systolic (congestive) heart failure: Secondary | ICD-10-CM | POA: Diagnosis not present

## 2014-06-05 DIAGNOSIS — I493 Ventricular premature depolarization: Secondary | ICD-10-CM

## 2014-06-05 MED ORDER — EZETIMIBE 10 MG PO TABS: 10.0000 mg | ORAL_TABLET | Freq: Every day | ORAL | Status: DC

## 2014-06-05 NOTE — Telephone Encounter (Signed)
Spoke with wife conerning increased lipids from recent lab work.  Dr. Aundra Dubin advised to change atorvastatin to crestor, however, patient reported a previous adverse reaction to crestor.  Dr. Aundra Dubin now recommends adding Zetia 10mg  once daily in addition to the atorvastatin.  Spoke with patient's wife about new medication and verified [facility] in [place] as preferred pharmacy.  Aware and agreeable to plan.  [place]

## 2014-06-11 ENCOUNTER — Telehealth: Payer: Self-pay | Admitting: Cardiology

## 2014-06-18 ENCOUNTER — Telehealth (HOSPITAL_COMMUNITY): Payer: Self-pay | Admitting: Vascular Surgery

## 2014-06-18 NOTE — Telephone Encounter (Signed)
Pt wife called pt bp is running high. He is also having ringing in ears, wife believes it may be medication related.. Please advise

## 2014-06-18 NOTE — Telephone Encounter (Signed)
pts wife reports b/p has been elevated since 06/06/14 193/84 186/86 188/71 183/81 172/81 189/76  06/15/14 191/79 172/82 Pt c/o HA and pounding in head, dizziness Pt fells Recent increase in coreg could be causing HA HR- 58-67  Per VO Amy Clegg, NP  Pt should increase hydralazine to 75 mg TID  Pt should re start Coreg 6.25 mg BID  Pt should call back on Friday with update

## 2014-06-24 ENCOUNTER — Telehealth (HOSPITAL_COMMUNITY): Payer: Self-pay | Admitting: Vascular Surgery

## 2014-06-24 MED ORDER — HYDRALAZINE HCL 50 MG PO TABS: 50.0000 mg | ORAL_TABLET | Freq: Three times a day (TID) | ORAL | Status: DC

## 2014-06-24 NOTE — Telephone Encounter (Signed)
Pt is about of Hydrazaline. He needs a refill with dosage changes, he ran out beacause of the dosage change the pharmacy will not refill old prescription until the 15th.. Please advise

## 2014-06-27 ENCOUNTER — Telehealth: Payer: Self-pay | Admitting: Internal Medicine

## 2014-06-27 NOTE — Telephone Encounter (Signed)
Monitor results faxed

## 2014-06-27 NOTE — Telephone Encounter (Signed)
Informed patient's wife that I would contact them next week about this monitor.  Explained that I am working on getting results from [place] office. She was fine with this and verbalized understanding.

## 2014-06-27 NOTE — Telephone Encounter (Signed)
New Message  Pt called for monitor results. Please call back to discuss

## 2014-06-27 NOTE — Telephone Encounter (Signed)
Trinidad Curet, RN from [facility] called stating that we put a monitor on Mr. Point and they need the results of monitor faxed To [facility]/  Fax # [phone] attention: Dr.Klein .

## 2014-07-02 NOTE — Telephone Encounter (Signed)
Left message explaining that we received monitor from [facility] office but Dr. Caryl Comes will not be able to review until next week, as [place] is currently out [place].

## 2014-07-16 ENCOUNTER — Encounter: Payer: Self-pay | Admitting: *Deleted

## 2014-07-16 NOTE — Telephone Encounter (Signed)
This encounter was created in error - please disregard.

## 2014-07-16 NOTE — Telephone Encounter (Signed)
Reviewed with patient.  Patient would like OV to discuss.  Aware EP scheduler will call him to arrange f/u.

## 2014-07-16 NOTE — Telephone Encounter (Signed)
Left message with patient's wife to have him call me.  She states he is playing golf right now.  (when calls back need to discuss monitor findings.  Per Dr. Caryl Comes:  PVCs too infrequent to explain cardiomyopathy -- can either schedule ICD or f/u office visit per patient)

## 2014-08-01 ENCOUNTER — Ambulatory Visit: Payer: Medicare Other | Admitting: Internal Medicine

## 2014-08-02 ENCOUNTER — Other Ambulatory Visit: Payer: Self-pay

## 2014-08-02 MED ORDER — DIGOXIN 125 MCG PO TABS: 0.1250 mg | ORAL_TABLET | Freq: Every day | ORAL | Status: DC

## 2014-08-05 ENCOUNTER — Other Ambulatory Visit (HOSPITAL_COMMUNITY): Payer: Self-pay | Admitting: *Deleted

## 2014-08-05 ENCOUNTER — Encounter: Payer: Self-pay | Admitting: Internal Medicine

## 2014-08-05 ENCOUNTER — Ambulatory Visit (INDEPENDENT_AMBULATORY_CARE_PROVIDER_SITE_OTHER): Payer: Medicare Other | Admitting: Internal Medicine

## 2014-08-05 VITALS — BP 174/67 | HR 61 | Ht 68.0 in | Wt 160.6 lb

## 2014-08-05 DIAGNOSIS — I2510 Atherosclerotic heart disease of native coronary artery without angina pectoris: Secondary | ICD-10-CM

## 2014-08-05 DIAGNOSIS — I509 Heart failure, unspecified: Secondary | ICD-10-CM

## 2014-08-05 MED ORDER — HYDRALAZINE HCL 50 MG PO TABS: 75.0000 mg | ORAL_TABLET | Freq: Three times a day (TID) | ORAL | Status: DC

## 2014-08-05 MED ORDER — SPIRONOLACTONE 25 MG PO TABS: 12.5000 mg | ORAL_TABLET | Freq: Every day | ORAL | Status: DC

## 2014-08-05 MED ORDER — SPIRONOLACTONE 25 MG PO TABS: 25.0000 mg | ORAL_TABLET | Freq: Every day | ORAL | Status: DC

## 2014-08-05 MED ORDER — ISOSORBIDE MONONITRATE ER 60 MG PO TB24: 60.0000 mg | ORAL_TABLET | Freq: Every day | ORAL | Status: DC

## 2014-08-05 NOTE — Patient Instructions (Signed)
Medication Instructions:  Your physician has recommended you make the following change in your medication:  1) START Imdur 60 mg by mouth daily 2) INCREASE Hydralazine HCL to 75 mg by mouth three times a day 3) START Aldactone 12.5 mg daily - DO NOT START UNTIL WE CALL YOU.    Labwork: We will schedule blood work once we have seen the results from Dr. Woody Seller.  Testing/Procedures: Your physician has requested that you have an echocardiogram. Echocardiography is a painless test that uses sound waves to create images of your heart. It provides your doctor with information about the size and shape of your heart and how well your heart's chambers and valves are working. This procedure takes approximately one hour. There are no restrictions for this procedure. IN 3 WEEKS   Follow-Up: Your physician recommends that you schedule a follow-up appointment in: 4 weeks with Dr. Aundra Dubin in [facility]  Follow up with Dr. Caryl Comes as needed.  Any Other Special Instructions Will Be Listed Below (If Applicable).

## 2014-08-05 NOTE — Progress Notes (Signed)
Electrophysiology Office Note   Date:  08/05/2014   ID:  [name], DOB 1933-01-24, MRN [id number]  PCP:  Glenda Chroman., MD  Cardiologist:  CHF Primary Electrophysiologist:    [name], MD    Chief Complaint  Patient presents with  . Appointment    discuss ICD     History of Present Illness: [place] is a 81 y.o. male     Seen in follow-up after her consultation for consideration of an ICD. He has ischemic heart disease with prior bypass surgery with which he had had interval improvement in LVEF from the 15-30% range. The hope had been at his last visit that there be further increase in his LV function. He was also noted to have frequent PVCs. Holter monitor demonstrated only about 1% PVCs. This is too few to explain his cardiomyopathy. We also introduced carvedilol at his last visit.   Today, he denies  symptoms of  chest pain, shortness of breath, orthopnea, PND, lower extremity edema, bleeding, or neurologic sequela.  he has no*  complaints of palpitations, lightheadedness presyncope or syncope  The patient is tolerating medications without difficulties and is otherwise without complaint today.   They bring in vital sign reports today. Blood pressures are 160 range heart rates are in the low 60 range.  His last creatinine was 1.842 months ago. Creatinine clearance is 33. He had blood work drawn last week by his PCP.   Past Medical History  Diagnosis Date  . Essential hypertension, benign   . Cerebrovascular accident     Right subcortical infarct 2006  . Cataract   . Macular degeneration     Dr. [name]  . Coronary atherosclerosis of native coronary artery     1) CABG 2005 2) LHC (910/15): patent SVG to PDA, occlusion of SVG sequence to PLA, patency of LIMA to LAD and patency of SVG to diagonal with sev stenosis at coronary anastomotic site  . Hyperlipidemia   . Varicose veins   . Diabetes mellitus, type 2   . Non-ST elevation MI (NSTEMI)     2005  .  Ischemic cardiomyopathy   . Chronic systolic congestive heart failure     a) ECHO (02/2012): EF 30-35%, no obvious thrombus b) ECHO (11/2013): EF 10%, AV mildly calcified, mild MR, RV mod dialted and sys fx sev reduced, TAPSE 0.8, lateral annulus peak S velocity 5.4 c) RHC (11/29/13): RA 15, RV: 65/19, PA 49%, CO/CI: 3.25 / 1.7 d) RHC (12/03/13): RA 1, RV 25/1/2, PA 27/10 (16), PCWP 6, Fick CO/CI: 5.3 / 2.9, PVR 1.9 WU, PA sat 72 and 74%    Past Surgical History  Procedure Laterality Date  . Coronary artery bypass graft  2005    LIMA to LAD, SVG diagonal, SVG to PDA and PLA  . Left and right heart catheterization with coronary/graft angiogram N/A 11/29/2013    Procedure: LEFT AND RIGHT HEART CATHETERIZATION WITH Beatrix Fetters;  Surgeon: Blane Ohara, MD;  Location[facility] C[facility] LAB;  Service: Cardiovascular;  Laterality: N/A;  . Right heart catheterization N/A 12/03/2013    Procedure: RIGHT HEART CATH;  Surgeon: Jolaine Artist, MD;  Location[facility] CATH LAB;  Service: Cardiovascular;  Laterality: N/A;     Current Outpatient Prescriptions  Medication Sig Dispense Refill  . allopurinol (ZYLOPRIM) 100 MG tablet Take 1 tablet by mouth daily.  1  . ALPRAZolam (XANAX) 0.25 MG tablet Take 0.5 tablets by mouth daily.  0  . aspirin 81  MG tablet Take 81 mg by mouth daily.    [name] atorvastatin (LIPITOR) 80 MG tablet Take 1 tablet (80 mg total) by mouth daily at 6 PM. 30 tablet 6  . carvedilol (COREG) 3.125 MG tablet Take 1 tablet by mouth daily.  0  . Cyanocobalamin (VITAMIN B-12) 1000 MCG SUBL Place under the tongue 3 (three) times a week.     . digoxin (LANOXIN) 0.125 MG tablet Take 1 tablet (0.125 mg total) by mouth daily. 30 tablet 11  . ezetimibe (ZETIA) 10 MG tablet Take 1 tablet (10 mg total) by mouth daily. 90 tablet 3  . furosemide (LASIX) 40 MG tablet Take 1 tablet (40 mg total) by mouth daily. Or as instructed by provider 60 tablet 3  . glipiZIDE (GLUCOTROL XL) 5 MG 24 hr tablet Take 1  tablet by mouth 2 (two) times daily.  1  . hydrALAZINE (APRESOLINE) 50 MG tablet Take 1 tablet (50 mg total) by mouth 3 (three) times daily. 90 tablet 3  . levothyroxine (SYNTHROID, LEVOTHROID) 50 MCG tablet Take 50 mcg by mouth daily.    [name] losartan (COZAAR) 50 MG tablet Take 1 tablet (50 mg total) by mouth daily. 45 tablet 3  . metFORMIN (GLUCOPHAGE) 500 MG tablet Take 500 mg by mouth 2 (two) times daily with a meal.    . Omega-3 Fatty Acids (FISH OIL) 1000 MG CAPS Take 1 capsule by mouth 2 (two) times daily.    . potassium chloride SA (K-DUR,KLOR-CON) 20 MEQ tablet Take 1 tablet by mouth as needed. TAKE ONLY IF TALKING LASIX  0  . traZODone (DESYREL) 50 MG tablet Take 50 mg by mouth every evening. For sleep  0   No current facility-administered medications for this visit.    Allergies:   Tetracycline   Social History:  The patient  reports that he has never smoked. He has never used smokeless tobacco. He reports that he does not drink alcohol or use illicit drugs.   Family History:  The patient's family history includes Dementia in his mother; Hypertension in his father.    ROS:  Please see the history of present illness.   Otherwise, review of systems is negative .    PHYSICAL EXAM: VS:  BP 174/67 mmHg  Pulse 61  Ht 5\' 8"  (1.727 m)  Wt 160 lb 9.6 oz (72.848 kg)  BMI 24.42 kg/m2 , BMI Body mass index is 24.42 kg/(m^2). GEN: Well nourished, well developed, in no acute distress HEENT: normal Neck: JVD flat  carotid bruits, or masses Cardiac: R RR; 2/6  murmur  rubs, noS4  Respiratory:  clear to auscultation bilaterally, normal work of breathing Back without kyphosis or CVAT GI: soft, nontender, nondistended, + BS MS: no deformity or atrophy Skin: warm and dry,   Extremities No Clubbing cyanosis  Edema Neuro:  Strength and sensation are intact Psych: euthymic mood, full affect  EKG:  EKG is not ordered today. The ekg ordered today shows NOT   Recent Labs: 11/26/2013: ALT  17 11/29/2013: Hemoglobin 12.9*; Platelets 210 12/01/2013: TSH 0.306* 12/24/2013: Pro B Natriuretic peptide (BNP) 5332.0* 05/27/2014: BUN 27*; Creatinine 1.84*; Potassium 3.9; Sodium 141    Lipid Panel     Component Value Date/Time   CHOL 237* 05/27/2014 1230   TRIG 181* 05/27/2014 1230   HDL 37* 05/27/2014 1230   CHOLHDL 6.4 05/27/2014 1230   VLDL 36 05/27/2014 1230   LDLCALC 164* 05/27/2014 1230     Wt Readings from Last 3 Encounters:  08/05/14 160 lb 9.6 oz (72.848 kg)  03/28/14 156 lb 9.6 oz (71.033 kg)  12/03/13 152 lb 1.9 oz (69 kg)      Other studies Reviewed: Additional studies/ records that were reviewed today include:  Laboratories as noted furthermore, Holter monitor had demonstrated only 1500 PVCs. Heart rate excursion was 36--118.  Review of the above records today demonstrates: As above    ASSESSMENT AND PLAN:  Ischemic Cardiomyopathy s/pCAB  CHF chronic systolic  HTN  PVCs right bundle inferior axis \ Bradycardia  Dyslipidemia     Current medicines are reviewed at length with the patient today  .   The patient does not have concerns regarding his medicines.  The following changes were made today:  His hydralazine was increased from 50--75 3 times a day for blood pressure, we will add Imdur 60 both for augmented afterload as well as blood pressure and after discussions with Dr. DM we will begin Aldactone. We will wait to get the results from his last creatinine to make sure it is not too low. Following initiation will check a metabolic profile in 4 days and in 14 days.  We will arrange follow-up with Dr. DM in about 4 weeks with the preceding echo. In the event that his ejection fraction is greater than 35% no further EP follow-up would be indicated  Labs/ tests ordered today include: As above   No orders of the defined types were placed in this encounter.     Disposition:   FU with me As above   Signed, [name], MD  08/05/2014 3:33 PM      [address] [place] [address] [phone] (office) [phone] (fax)

## 2014-08-06 ENCOUNTER — Telehealth: Payer: Self-pay

## 2014-08-06 DIAGNOSIS — I509 Heart failure, unspecified: Secondary | ICD-10-CM

## 2014-08-06 DIAGNOSIS — I159 Secondary hypertension, unspecified: Secondary | ICD-10-CM

## 2014-08-06 NOTE — Telephone Encounter (Signed)
Pt aware he is cleared to start his 12.5 mg of Aldactone daily.  Pt stated he was going to pick up medication from pharmacy this afternoon.   Pt aware that lab work is to be completed on 5/23 and 5/31 to check kidney function.  BMET setup for pt to complete at [facility] in [place].  Pt provided with lab address, phone number, and hours of operation.

## 2014-08-12 ENCOUNTER — Telehealth: Payer: Self-pay | Admitting: Internal Medicine

## 2014-08-12 ENCOUNTER — Other Ambulatory Visit: Payer: Self-pay | Admitting: *Deleted

## 2014-08-12 DIAGNOSIS — I5022 Chronic systolic (congestive) heart failure: Secondary | ICD-10-CM

## 2014-08-12 DIAGNOSIS — I159 Secondary hypertension, unspecified: Secondary | ICD-10-CM

## 2014-08-12 DIAGNOSIS — I509 Heart failure, unspecified: Secondary | ICD-10-CM

## 2014-08-12 NOTE — Telephone Encounter (Signed)
Called patient's wife and advised that I faxed an order for the BMET to [facility] in [place] at [phone]. She will bring him back for the lab work.

## 2014-08-12 NOTE — Telephone Encounter (Signed)
New Message       Pt's wife calling stating that pt was supposed to have lab work done at [facility] and that when they arrived today to get that done, they were told that there was no order for labs for pt. Please call back and advise.

## 2014-08-14 LAB — BASIC METABOLIC PANEL
BUN: 36 mg/dL — ABNORMAL HIGH (ref 6–23)
CO2: 27 mEq/L (ref 19–32)
Calcium: 9.3 mg/dL (ref 8.4–10.5)
Chloride: 105 mEq/L (ref 96–112)
Creat: 1.61 mg/dL — ABNORMAL HIGH (ref 0.50–1.35)
Glucose, Bld: 138 mg/dL — ABNORMAL HIGH (ref 70–99)
Potassium: 4.3 mEq/L (ref 3.5–5.3)
Sodium: 144 mEq/L (ref 135–145)

## 2014-08-20 ENCOUNTER — Other Ambulatory Visit: Payer: Self-pay | Admitting: Internal Medicine

## 2014-08-21 LAB — BASIC METABOLIC PANEL
BUN: 31 mg/dL — ABNORMAL HIGH (ref 6–23)
CO2: 28 mEq/L (ref 19–32)
Calcium: 9.2 mg/dL (ref 8.4–10.5)
Chloride: 107 mEq/L (ref 96–112)
Creat: 1.61 mg/dL — ABNORMAL HIGH (ref 0.50–1.35)
Glucose, Bld: 138 mg/dL — ABNORMAL HIGH (ref 70–99)
Potassium: 4.3 mEq/L (ref 3.5–5.3)
Sodium: 145 mEq/L (ref 135–145)

## 2014-08-26 ENCOUNTER — Other Ambulatory Visit (HOSPITAL_COMMUNITY): Payer: Medicare Other

## 2014-08-28 ENCOUNTER — Ambulatory Visit (INDEPENDENT_AMBULATORY_CARE_PROVIDER_SITE_OTHER): Payer: Medicare Other

## 2014-08-28 ENCOUNTER — Other Ambulatory Visit: Payer: Self-pay

## 2014-08-28 DIAGNOSIS — I509 Heart failure, unspecified: Secondary | ICD-10-CM | POA: Diagnosis not present

## 2014-09-02 ENCOUNTER — Ambulatory Visit (HOSPITAL_COMMUNITY)
Admission: RE | Admit: 2014-09-02 | Discharge: 2014-09-02 | Disposition: A | Discharge status: Home / Self Care | Payer: Medicare Other | Source: Ambulatory Visit | Attending: Internal Medicine | Admitting: Internal Medicine

## 2014-09-02 ENCOUNTER — Encounter (HOSPITAL_COMMUNITY): Payer: Self-pay

## 2014-09-02 VITALS — BP 116/70 | HR 66 | Wt 155.8 lb

## 2014-09-02 DIAGNOSIS — N183 Chronic kidney disease, stage 3 unspecified: Secondary | ICD-10-CM

## 2014-09-02 DIAGNOSIS — Z951 Presence of aortocoronary bypass graft: Secondary | ICD-10-CM | POA: Diagnosis not present

## 2014-09-02 DIAGNOSIS — I5022 Chronic systolic (congestive) heart failure: Secondary | ICD-10-CM

## 2014-09-02 DIAGNOSIS — I10 Essential (primary) hypertension: Secondary | ICD-10-CM | POA: Diagnosis not present

## 2014-09-02 DIAGNOSIS — I2510 Atherosclerotic heart disease of native coronary artery without angina pectoris: Secondary | ICD-10-CM

## 2014-09-02 DIAGNOSIS — I509 Heart failure, unspecified: Secondary | ICD-10-CM

## 2014-09-02 MED ORDER — SPIRONOLACTONE 25 MG PO TABS: 12.5000 mg | ORAL_TABLET | Freq: Every day | ORAL | Status: DC

## 2014-09-02 MED ORDER — CARVEDILOL 6.25 MG PO TABS: 6.2500 mg | ORAL_TABLET | Freq: Two times a day (BID) | ORAL | Status: DC

## 2014-09-02 NOTE — Patient Instructions (Signed)
INCREASE Coreg to 6.25mg  twice a day.  START Spironolactone 12.5 mg daily.  LABS in 10 days in [place]. (bmet bnp)  FOLLOW UP in 3 months.

## 2014-09-02 NOTE — Progress Notes (Signed)
Patient ID: Clayton Morton, male   DOB: 08/11/1932, 81 y.o.   MRN: [id number] PCP: Dr. Woody Seller Primary Cardiologist: [name]  HPI: [name] is a 81 yo with a history of ICM, chronic systolic HF, HTN, CVA 123456, DM2, HLD and CAD s/p CABG 2005.  Scheduled for L/RHC on 11/29/13 and was admitted with low output and severe CAD. Repeat RHC showed low filling pressures and normal CO.   Echo (11/2013): EF 10%, RV mod dilated and systolic fx severely reduced Echo (02/26/14): EF 30-35% inferior AK mild RV dysfunction Echo (6/16): EF 25-30%, mild to moderate LV dilation, regional WMAs, mild to moderate MR, moderately decreased RV systolic function, severe LAE.   Losartan switched to [facility] but unable to tolerate due to severe hypotension. Switched back to losartan. He saw Dr Caryl Comes for consideration of ICD.  This was decided against for the time being given evidence for some LV functional recovery (EF 10% => 30%).  Dr Caryl Comes started him on Coreg. There was also concern for frequent PVCs and possible contribution of PVCs to his cardiomyopathy.  Holter monitor was done in 6/16 and showed only about 1% PVCs.    Today, [name] is doing well.  No chest pain, no exertional dyspnea.  No orthopnea or PND.  He is taking Lasix about once a week.  Some fatigue. He never started on spironolactone and he is only taking Coreg once a day.   Labs:  12/10/13: K+ 4.5, BUN 31, creatinine 1.46 12/18/13 (PCP office): K+ 4.0, creatinine 1.74 12/24/13 K+ 4.1, creatinine 1.51, pro-BNP 5332 02/05/14 K+ 3.9 creatinine 1.83 12/15 K+ 3.9 creatinine 1.71 => 1.86, digoxin 0.5 5/16: K 4.3, creatinine 1.61, LDL 57, HDL 37  ROS: All systems negative except as listed in HPI, PMH and Problem List.  SH:  History   Social History  . Marital Status: Married    Spouse Name: N/A  . Number of Children: N/A  . Years of Education: N/A   Occupational History  . Not on file.   Social History Main Topics  . Smoking status: Never Smoker    . Smokeless tobacco: Never Used  . Alcohol Use: No  . Drug Use: No  . Sexual Activity: Not on file   Other Topics Concern  . 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 Lives in [place] with wife.     FH:  Family History  Problem Relation Age of Onset  . Hypertension Father     CAD, CHF; deceased at 62  . Dementia Mother     deceased at age 57    Past Medical History  Diagnosis Date  . Essential hypertension, benign   . Cerebrovascular accident     Right subcortical infarct 2006  . Cataract   . Macular degeneration     Dr. [name]  . Coronary atherosclerosis of native coronary artery     1) CABG 2005 2) LHC (910/15): patent SVG to PDA, occlusion of SVG sequence to PLA, patency of LIMA to LAD and patency of SVG to diagonal with sev stenosis at coronary anastomotic site  . Hyperlipidemia   . Varicose veins   . Diabetes mellitus, type 2   . Non-ST elevation MI (NSTEMI)     2005  . Ischemic cardiomyopathy   . Chronic systolic congestive heart failure     a) ECHO (02/2012): EF 30-35%, no obvious thrombus b) ECHO (11/2013): EF 10%, AV mildly calcified,  mild MR, RV mod dialted and sys fx sev reduced, TAPSE 0.8, lateral annulus peak S velocity 5.4 c) RHC (11/29/13): RA 15, RV: 65/19, PA 49%, CO/CI: 3.25 / 1.7 d) RHC (12/03/13): RA 1, RV 25/1/2, PA 27/10 (16), PCWP 6, Fick CO/CI: 5.3 / 2.9, PVR 1.9 WU, PA sat 72 and 74%     Current Outpatient Prescriptions  Medication Sig Dispense Refill  . allopurinol (ZYLOPRIM) 100 MG tablet Take 1 tablet by mouth daily.  1  . ALPRAZolam (XANAX) 0.25 MG tablet Take 0.5 tablets by mouth daily.  0  . aspirin 81 MG tablet Take 81 mg by mouth daily.    [name] atorvastatin (LIPITOR) 80 MG tablet Take 1 tablet (80 mg total) by mouth daily at 6 PM. 30 tablet 6  . carvedilol (COREG) 6.25 MG tablet Take 1 tablet (6.25 mg total) by mouth 2 (two) times daily with a meal. 60 tablet 3  . Cyanocobalamin (VITAMIN B-12) 1000 MCG  SUBL Place under the tongue 3 (three) times a week.     . digoxin (LANOXIN) 0.125 MG tablet Take 1 tablet (0.125 mg total) by mouth daily. 30 tablet 11  . furosemide (LASIX) 40 MG tablet Take 1 tablet (40 mg total) by mouth daily. Or as instructed by provider 60 tablet 3  . glipiZIDE (GLUCOTROL XL) 5 MG 24 hr tablet Take 1 tablet by mouth 2 (two) times daily.  1  . hydrALAZINE (APRESOLINE) 50 MG tablet Take 1.5 tablets (75 mg total) by mouth 3 (three) times daily. 90 tablet 3  . isosorbide mononitrate (IMDUR) 60 MG 24 hr tablet Take 1 tablet (60 mg total) by mouth daily. 90 tablet 3  . levothyroxine (SYNTHROID, LEVOTHROID) 50 MCG tablet Take 50 mcg by mouth daily.    [name] losartan (COZAAR) 50 MG tablet Take 1 tablet (50 mg total) by mouth daily. 45 tablet 3  . metFORMIN (GLUCOPHAGE) 500 MG tablet Take 500 mg by mouth 2 (two) times daily with a meal.    . Omega-3 Fatty Acids (FISH OIL) 1000 MG CAPS Take 1 capsule by mouth 2 (two) times daily.    . potassium chloride SA (K-DUR,KLOR-CON) 20 MEQ tablet Take 1 tablet by mouth as needed. TAKE ONLY IF TALKING LASIX  0  . traZODone (DESYREL) 50 MG tablet Take 50 mg by mouth every evening. For sleep  0  . spironolactone (ALDACTONE) 25 MG tablet Take 0.5 tablets (12.5 mg total) by mouth daily. 90 tablet 3   No current facility-administered medications for this encounter.    Filed Vitals:   09/02/14 1436  BP: 116/70  Pulse: 66  Weight: 155 lb 12 oz (70.648 kg)  SpO2: 97%    PHYSICAL EXAM: General:  Elderly well appearing. No resp difficulty, wife present  HEENT: normal Neck: supple. JVP flat. Carotids 2+ bilaterally; no bruits. No lymphadenopathy or thryomegaly appreciated. Cor: PMI normal. SB & regular rhythm. No rubs, gallops or murmurs. Lungs: clear Abdomen: soft, nontender, nondistended. No hepatosplenomegaly. No bruits or masses. Good bowel sounds. Extremities: no cyanosis, clubbing, rash, edema Neuro: alert & orientedx3, cranial nerves  grossly intact. Moves all 4 extremities w/o difficulty. Affect pleasant.  ASSESSMENT & PLAN:  1) Chronic systolic HF: Ischemic cardiomyopathy.  Most recent echo in 6/16 showed persistently low EF, 25-30%.  NYHA II symptoms and volume status stable.  - Will continue lasix 40 mg PRN. If weight greater than 153 lbs take 40 mg, if weight less than 153 lbs hold lasix. Would like weight  about 151-153 lbs. - Increase Coreg from 6.25 mg daily to 6.25 mg bid.    - Continue losartan 50 mg daily.  BMET ok recently.  - Continue digoxin 0.125 mg daily.   - Not enough PVCs by holter monitoring recently to play role in cardiomyopathy.  - Add spironolactone 12.5 mg daily with BMET in 10 days.  - I will send him back to Dr Caryl Comes to consider ICD as EF is persistently low.  2) CAD s/p CABG:  Last LHC (11/2013) severe native CAD, occlusion of SVG sequence to PLA. Continue statin and ASA 81 daily. Lipids ok in 5/16.  3) HTN:  BP not elevated.  4) CKD, stage III:  Stable creatinine in 5/16.    Loralie Champagne  09/02/2014

## 2014-09-12 ENCOUNTER — Other Ambulatory Visit: Payer: Self-pay

## 2014-09-12 DIAGNOSIS — I509 Heart failure, unspecified: Secondary | ICD-10-CM

## 2014-09-12 MED ORDER — DIGOXIN 125 MCG PO TABS: 0.1250 mg | ORAL_TABLET | Freq: Every day | ORAL | Status: DC

## 2014-09-12 MED ORDER — SPIRONOLACTONE 25 MG PO TABS: 12.5000 mg | ORAL_TABLET | Freq: Every day | ORAL | Status: DC

## 2014-09-12 MED ORDER — ALLOPURINOL 100 MG PO TABS: 100.0000 mg | ORAL_TABLET | Freq: Every day | ORAL | Status: DC

## 2014-09-12 MED ORDER — POTASSIUM CHLORIDE CRYS ER 20 MEQ PO TBCR: 20.0000 meq | EXTENDED_RELEASE_TABLET | ORAL | Status: DC | PRN

## 2014-09-12 MED ORDER — CARVEDILOL 6.25 MG PO TABS: 6.2500 mg | ORAL_TABLET | Freq: Two times a day (BID) | ORAL | Status: DC

## 2014-09-12 MED ORDER — ISOSORBIDE MONONITRATE ER 60 MG PO TB24: 60.0000 mg | ORAL_TABLET | Freq: Every day | ORAL | Status: DC

## 2014-09-12 MED ORDER — ATORVASTATIN CALCIUM 80 MG PO TABS: 80.0000 mg | ORAL_TABLET | Freq: Every day | ORAL | Status: DC

## 2014-09-12 MED ORDER — FUROSEMIDE 40 MG PO TABS: 40.0000 mg | ORAL_TABLET | Freq: Every day | ORAL | Status: DC

## 2014-09-12 MED ORDER — HYDRALAZINE HCL 50 MG PO TABS: 75.0000 mg | ORAL_TABLET | Freq: Three times a day (TID) | ORAL | Status: DC

## 2014-09-12 MED ORDER — LOSARTAN POTASSIUM 50 MG PO TABS: 50.0000 mg | ORAL_TABLET | Freq: Every day | ORAL | Status: DC

## 2014-09-13 ENCOUNTER — Encounter: Payer: Self-pay | Admitting: Internal Medicine

## 2014-09-13 ENCOUNTER — Other Ambulatory Visit: Payer: Self-pay | Admitting: Cardiology

## 2014-09-13 NOTE — Telephone Encounter (Signed)
This encounter was created in error - please disregard.

## 2014-09-13 NOTE — Telephone Encounter (Signed)
New message ° ° ° ° ° °Returning a nurses call °

## 2014-09-14 LAB — BASIC METABOLIC PANEL
BUN/Creatinine Ratio: 19 (ref 10–22)
BUN: 28 mg/dL — ABNORMAL HIGH (ref 8–27)
CO2: 22 mmol/L (ref 18–29)
Calcium: 9.3 mg/dL (ref 8.6–10.2)
Chloride: 106 mmol/L (ref 97–108)
Creatinine, Ser: 1.46 mg/dL — ABNORMAL HIGH (ref 0.76–1.27)
GFR calc Af Amer: 52 mL/min/{1.73_m2} — ABNORMAL LOW (ref >59)
GFR calc non Af Amer: 45 mL/min/{1.73_m2} — ABNORMAL LOW (ref >59)
Glucose: 114 mg/dL — ABNORMAL HIGH (ref 65–99)
Potassium: 4.3 mmol/L (ref 3.5–5.2)
Sodium: 145 mmol/L — ABNORMAL HIGH (ref 134–144)

## 2014-09-15 LAB — BRAIN NATRIURETIC PEPTIDE: BNP: 834.2 pg/mL — ABNORMAL HIGH (ref 0.0–100.0)

## 2014-09-17 ENCOUNTER — Telehealth: Payer: Self-pay | Admitting: *Deleted

## 2014-09-17 NOTE — Telephone Encounter (Addendum)
Notes Recorded by Deboraha Sprang, MD on 09/08/2014 at 4:44 PM Please Inform Patient echo showed worsening LV function If he would like to pursue ICD implantation then polaease arrange OV ---------------------------------------------  I spoke with wife (ok per pt) - informed her of Dr. Olin Pia instructions/recommendations.  Explained that OV is for H&P and discussion of implant.  She is aware we will arrange procedure when they are here.

## 2014-10-20 NOTE — Progress Notes (Signed)
Electrophysiology Office Note Date: 10/21/2014  ID:  Clayton Morton, DOB 12-09-1932, MRN [id number]  PCP: Glenda Chroman., MD Primary Cardiologist: Aundra Dubin Electrophysiologist: Caryl Comes  CC: discuss ICD implantation  Clayton Morton is a 81 y.o. male seen today for Dr Caryl Comes. He was previously evaluated by Dr Caryl Comes for consideration of ICD implant.  His medications were optimized and repeat echo showed persistently decreased EF.  He presents today to discuss ICD implantation. Since last being seen in our clinic, the patient reports doing very well.  He denies chest pain, palpitations, dyspnea, PND, orthopnea, nausea, vomiting, dizziness, syncope, edema, weight gain, or early satiety.  Past Medical History  Diagnosis Date  . Essential hypertension, benign   . Cerebrovascular accident     Right subcortical infarct 2006  . Cataract   . Macular degeneration     Dr. [name]  . Coronary atherosclerosis of native coronary artery     1) CABG 2005 2) LHC (910/15): patent SVG to PDA, occlusion of SVG sequence to PLA, patency of LIMA to LAD and patency of SVG to diagonal with sev stenosis at coronary anastomotic site  . Hyperlipidemia   . Varicose veins   . Diabetes mellitus, type 2   . Non-ST elevation MI (NSTEMI)     2005  . Ischemic cardiomyopathy   . Chronic systolic congestive heart failure     a) ECHO (02/2012): EF 30-35%, no obvious thrombus b) ECHO (11/2013): EF 10%, AV mildly calcified, mild MR, RV mod dialted and sys fx sev reduced, TAPSE 0.8, lateral annulus peak S velocity 5.4 c) RHC (11/29/13): RA 15, RV: 65/19, PA 49%, CO/CI: 3.25 / 1.7 d) RHC (12/03/13): RA 1, RV 25/1/2, PA 27/10 (16), PCWP 6, Fick CO/CI: 5.3 / 2.9, PVR 1.9 WU, PA sat 72 and 74%    Past Surgical History  Procedure Laterality Date  . Coronary artery bypass graft  2005    LIMA to LAD, SVG diagonal, SVG to PDA and PLA  . Left and right heart catheterization with coronary/graft angiogram N/A 11/29/2013    Procedure: LEFT  AND RIGHT HEART CATHETERIZATION WITH Beatrix Fetters;  Surgeon: Blane Ohara, MD;  Location[facility] C[facility] LAB;  Service: Cardiovascular;  Laterality: N/A;  . Right heart catheterization N/A 12/03/2013    Procedure: RIGHT HEART CATH;  Surgeon: Jolaine Artist, MD;  Location: [facility] CATH LAB;  Service: Cardiovascular;  Laterality: N/A;    Current Outpatient Prescriptions  Medication Sig Dispense Refill  . allopurinol (ZYLOPRIM) 100 MG tablet Take 100 mg by mouth as needed.    . ALPRAZolam (XANAX) 0.25 MG tablet Take 0.5 tablets by mouth daily.  0  . aspirin 81 MG tablet Take 81 mg by mouth daily.    [name] atorvastatin (LIPITOR) 80 MG tablet Take 1 tablet (80 mg total) by mouth daily at 6 PM. 90 tablet 1  . carvedilol (COREG) 6.25 MG tablet Take 1 tablet (6.25 mg total) by mouth 2 (two) times daily with a meal. 180 tablet 1  . Cyanocobalamin (VITAMIN B-12) 1000 MCG SUBL Place 1,000 mcg under the tongue 3 (three) times a week.    . digoxin (LANOXIN) 0.125 MG tablet Take 1 tablet (0.125 mg total) by mouth daily. 30 tablet 11  . furosemide (LASIX) 40 MG tablet Take 1 tablet (40 mg total) by mouth daily. Or as instructed by provider 90 tablet 1  . glipiZIDE (GLUCOTROL XL) 5 MG 24 hr tablet Take 1 tablet by mouth 2 (two) times  daily.  1  . hydrALAZINE (APRESOLINE) 50 MG tablet Take 1.5 tablets (75 mg total) by mouth 3 (three) times daily. 405 tablet 1  . isosorbide mononitrate (IMDUR) 60 MG 24 hr tablet Take 1 tablet (60 mg total) by mouth daily. 90 tablet 1  . levothyroxine (SYNTHROID, LEVOTHROID) 50 MCG tablet Take 50 mcg by mouth daily.    [name] losartan (COZAAR) 50 MG tablet Take 1 tablet (50 mg total) by mouth daily. 90 tablet 1  . metFORMIN (GLUCOPHAGE) 500 MG tablet Take 500 mg by mouth 2 (two) times daily with a meal.    . Omega-3 Fatty Acids (FISH OIL) 1000 MG CAPS Take 1 capsule by mouth 2 (two) times daily.    . ONE TOUCH ULTRA TEST test strip   0  . potassium chloride SA (K-DUR,KLOR-CON) 20  MEQ tablet Take 1 tablet (20 mEq total) by mouth as needed. TAKE ONLY IF TALKING LASIX 90 tablet 1  . spironolactone (ALDACTONE) 25 MG tablet Take 0.5 tablets (12.5 mg total) by mouth daily. 45 tablet 1  . traZODone (DESYREL) 50 MG tablet Take 50 mg by mouth every evening. For sleep  0   No current facility-administered medications for this visit.    Allergies:   Tetracycline   Social History: History   Social History  . Marital Status: Married    Spouse Name: N/A  . Number of Children: N/A  . Years of Education: N/A   Occupational History  . Not on file.   Social History Main Topics  . Smoking status: Never Smoker   . Smokeless tobacco: Never Used  . Alcohol Use: No  . Drug Use: No  . Sexual Activity: Not on file   Other Topics Concern  . 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     Family History: Family History  Problem Relation Age of Onset  . Hypertension Father     CAD, CHF; deceased at 59  . Dementia Mother     deceased at age 52    Review of Systems: All other systems reviewed and are otherwise negative except as noted above.   Physical Exam: VS:  BP 130/62 mmHg  Pulse 63  Ht 5\' 8"  (1.727 m)  Wt 157 lb 3.2 oz (71.305 kg)  BMI 23.91 kg/m2 , BMI Body mass index is 23.91 kg/(m^2). Wt Readings from Last 3 Encounters:  10/21/14 157 lb 3.2 oz (71.305 kg)  09/02/14 155 lb 12 oz (70.648 kg)  08/05/14 160 lb 9.6 oz (72.848 kg)    GEN- The patient is well appearing, alert and oriented x 3 today.   HEENT: normocephalic, atraumatic; sclera clear, conjunctiva pink; hearing intact; oropharynx clear; neck supple  Lungs- Clear to ausculation bilaterally, normal work of breathing.  No wheezes, rales, rhonchi Heart- Regular rate and rhythm, no murmurs, rubs or gallops  GI- soft, non-tender, non-distended, bowel sounds present  Extremities- no clubbing, cyanosis, or edema  MS- no significant  deformity or atrophy Skin- warm and dry, no rash or lesion  Psych- euthymic mood, full affect Neuro- strength and sensation are intact   EKG:  EKG is ordered today. The ekg ordered today shows sinus rhythm, 1st degree AV block, rate 63, RBBB  Recent Labs: 11/26/2013: ALT 17 11/29/2013: Hemoglobin 12.9*; Platelets 210 12/01/2013: TSH 0.306* 12/24/2013: Pro B Natriuretic peptide (BNP) 5332.0* 09/13/2014: BNP 834.2*; BUN 28*; Creatinine, Ser 1.46*; Potassium 4.3; Sodium  145*    Other studies Reviewed: Additional studies/ records that were reviewed today include: Dr Olin Pia notes, AHF clinic notes, recent echo  Assessment and Plan: 1.  ICM/chronic systolic heart failure The patient has a persistently decreased EF despite guideline directed therapy. Risks, benefits of ICD implantation were discussed with the patient today who wishes to proceed. Will schedule with Dr Caryl Comes at the next available time.   2.  CAD s/p CABG No recent ischemic symptoms Continue medical therapy  3.  HTN Stable No change required today  4.  CKD, stage III Stable No change required today    Current medicines are reviewed at length with the patient today.   The patient does not have concerns regarding his medicines.  The following changes were made today:  none  Labs/ tests ordered today include: pre-procedure labs   Disposition:   Follow up with Dr Caryl Comes following ICD implantation   Signed, Chanetta Marshall, NP 10/21/2014 10:54 AM   [facility] [address] [place]  [address] [phone] (office) [phone] (fax)

## 2014-10-21 ENCOUNTER — Encounter: Payer: Self-pay | Admitting: Nurse Practitioner

## 2014-10-21 ENCOUNTER — Ambulatory Visit (INDEPENDENT_AMBULATORY_CARE_PROVIDER_SITE_OTHER): Payer: Medicare Other | Admitting: Nurse Practitioner

## 2014-10-21 ENCOUNTER — Encounter: Payer: Self-pay | Admitting: *Deleted

## 2014-10-21 VITALS — BP 130/62 | HR 63 | Ht 68.0 in | Wt 157.2 lb

## 2014-10-21 DIAGNOSIS — I10 Essential (primary) hypertension: Secondary | ICD-10-CM

## 2014-10-21 DIAGNOSIS — I5022 Chronic systolic (congestive) heart failure: Secondary | ICD-10-CM

## 2014-10-21 DIAGNOSIS — I255 Ischemic cardiomyopathy: Secondary | ICD-10-CM

## 2014-10-21 LAB — BASIC METABOLIC PANEL
BUN: 40 mg/dL — ABNORMAL HIGH (ref 6–23)
CO2: 27 mEq/L (ref 19–32)
Calcium: 9.4 mg/dL (ref 8.4–10.5)
Chloride: 108 mEq/L (ref 96–112)
Creatinine, Ser: 1.96 mg/dL — ABNORMAL HIGH (ref 0.40–1.50)
GFR: 35.18 mL/min — ABNORMAL LOW (ref >60.00)
Glucose, Bld: 161 mg/dL — ABNORMAL HIGH (ref 70–99)
Potassium: 4.3 mEq/L (ref 3.5–5.1)
Sodium: 145 mEq/L (ref 135–145)

## 2014-10-21 LAB — CBC
HCT: 38.1 % — ABNORMAL LOW (ref 39.0–52.0)
Hemoglobin: 12.8 g/dL — ABNORMAL LOW (ref 13.0–17.0)
MCHC: 33.6 g/dL (ref 30.0–36.0)
MCV: 95.2 fl (ref 78.0–100.0)
Platelets: 200 10*3/uL (ref 150.0–400.0)
RBC: 4 Mil/uL — ABNORMAL LOW (ref 4.22–5.81)
RDW: 13.7 % (ref 11.5–15.5)
WBC: 6.3 10*3/uL (ref 4.0–10.5)

## 2014-10-21 NOTE — Patient Instructions (Signed)
Medication Instructions:   Your physician recommends that you continue on your current medications as directed. Please refer to the Current Medication list given to you today.   Labwork:  BMET AND CBC    Testing/Procedures:  SEE LETTER    Follow-Up:  10 TO 14 DAYS WITH DEVICE CLINIC  WOUND CHECK   3 MONTHS WITH DR Caryl Comes    Any Other Special Instructions Will Be Listed Below (If Applicable).

## 2014-10-22 ENCOUNTER — Telehealth: Payer: Self-pay | Admitting: *Deleted

## 2014-10-24 ENCOUNTER — Telehealth: Payer: Self-pay | Admitting: *Deleted

## 2014-10-24 NOTE — Telephone Encounter (Signed)
Called and informed ICD procedure time moved to 12:30 pm on 8/10.  Arrive at hospital at 11:00 a.m.

## 2014-10-30 ENCOUNTER — Ambulatory Visit (HOSPITAL_COMMUNITY)
Admission: RE | Admit: 2014-10-30 | Discharge: 2014-10-31 | Disposition: A | Discharge status: Home / Self Care | Payer: Medicare Other | Source: Ambulatory Visit | Attending: Internal Medicine | Admitting: Internal Medicine

## 2014-10-30 ENCOUNTER — Encounter (HOSPITAL_COMMUNITY)
Admission: RE | Disposition: A | Discharge status: Home / Self Care | Payer: Self-pay | Source: Ambulatory Visit | Attending: Internal Medicine

## 2014-10-30 ENCOUNTER — Encounter (HOSPITAL_COMMUNITY): Payer: Self-pay | Admitting: General Practice

## 2014-10-30 DIAGNOSIS — N183 Chronic kidney disease, stage 3 (moderate): Secondary | ICD-10-CM | POA: Diagnosis not present

## 2014-10-30 DIAGNOSIS — R001 Bradycardia, unspecified: Secondary | ICD-10-CM

## 2014-10-30 DIAGNOSIS — I5022 Chronic systolic (congestive) heart failure: Secondary | ICD-10-CM | POA: Diagnosis present

## 2014-10-30 DIAGNOSIS — Z8673 Personal history of transient ischemic attack (TIA), and cerebral infarction without residual deficits: Secondary | ICD-10-CM | POA: Insufficient documentation

## 2014-10-30 DIAGNOSIS — I129 Hypertensive chronic kidney disease with stage 1 through stage 4 chronic kidney disease, or unspecified chronic kidney disease: Secondary | ICD-10-CM | POA: Insufficient documentation

## 2014-10-30 DIAGNOSIS — Z951 Presence of aortocoronary bypass graft: Secondary | ICD-10-CM | POA: Diagnosis not present

## 2014-10-30 DIAGNOSIS — E1122 Type 2 diabetes mellitus with diabetic chronic kidney disease: Secondary | ICD-10-CM | POA: Insufficient documentation

## 2014-10-30 DIAGNOSIS — I252 Old myocardial infarction: Secondary | ICD-10-CM | POA: Diagnosis not present

## 2014-10-30 DIAGNOSIS — I429 Cardiomyopathy, unspecified: Secondary | ICD-10-CM | POA: Diagnosis not present

## 2014-10-30 DIAGNOSIS — H3530 Unspecified macular degeneration: Secondary | ICD-10-CM | POA: Insufficient documentation

## 2014-10-30 DIAGNOSIS — E785 Hyperlipidemia, unspecified: Secondary | ICD-10-CM | POA: Insufficient documentation

## 2014-10-30 DIAGNOSIS — I4510 Unspecified right bundle-branch block: Secondary | ICD-10-CM | POA: Diagnosis not present

## 2014-10-30 DIAGNOSIS — Z8249 Family history of ischemic heart disease and other diseases of the circulatory system: Secondary | ICD-10-CM | POA: Diagnosis not present

## 2014-10-30 DIAGNOSIS — Z959 Presence of cardiac and vascular implant and graft, unspecified: Secondary | ICD-10-CM

## 2014-10-30 DIAGNOSIS — Z7982 Long term (current) use of aspirin: Secondary | ICD-10-CM | POA: Diagnosis not present

## 2014-10-30 DIAGNOSIS — I2510 Atherosclerotic heart disease of native coronary artery without angina pectoris: Secondary | ICD-10-CM | POA: Diagnosis not present

## 2014-10-30 DIAGNOSIS — Z79899 Other long term (current) drug therapy: Secondary | ICD-10-CM | POA: Insufficient documentation

## 2014-10-30 DIAGNOSIS — I255 Ischemic cardiomyopathy: Secondary | ICD-10-CM | POA: Diagnosis present

## 2014-10-30 DIAGNOSIS — Z95810 Presence of automatic (implantable) cardiac defibrillator: Secondary | ICD-10-CM

## 2014-10-30 HISTORY — DX: Exudative age-related macular degeneration, right eye, stage unspecified: H35.3210

## 2014-10-30 HISTORY — DX: Calculus of kidney: N20.0

## 2014-10-30 HISTORY — DX: Presence of automatic (implantable) cardiac defibrillator: Z95.810

## 2014-10-30 HISTORY — PX: EP IMPLANTABLE DEVICE: SHX172B

## 2014-10-30 HISTORY — DX: Personal history of other diseases of the musculoskeletal system and connective tissue: Z87.39

## 2014-10-30 HISTORY — PX: CARDIAC DEFIBRILLATOR PLACEMENT: SHX171

## 2014-10-30 LAB — SURGICAL PCR SCREEN
MRSA, PCR: NEGATIVE
Staphylococcus aureus: NEGATIVE

## 2014-10-30 LAB — GLUCOSE, CAPILLARY
Glucose-Capillary: 109 mg/dL — ABNORMAL HIGH (ref 65–99)
Glucose-Capillary: 87 mg/dL (ref 65–99)

## 2014-10-30 SURGERY — ICD IMPLANT
Anesthesia: LOCAL

## 2014-10-30 MED ORDER — ATORVASTATIN CALCIUM 80 MG PO TABS
80.0000 mg | ORAL_TABLET | Freq: Every day | ORAL | Status: DC
  Administered 2014-10-30: 80 mg via ORAL
  Filled 2014-10-30 (×2): qty 1

## 2014-10-30 MED ORDER — ACETAMINOPHEN 325 MG PO TABS
325.0000 mg | ORAL_TABLET | ORAL | Status: DC | PRN
Start: 2014-10-30 — End: 2014-10-31
  Filled 2014-10-30 (×2): qty 2

## 2014-10-30 MED ORDER — LIDOCAINE HCL (PF) 1 % IJ SOLN
INTRAMUSCULAR | Status: AC
  Filled 2014-10-30: qty 60

## 2014-10-30 MED ORDER — TRAZODONE HCL 50 MG PO TABS
50.0000 mg | ORAL_TABLET | Freq: Every evening | ORAL | Status: DC | PRN
  Administered 2014-10-30: 50 mg via ORAL
  Filled 2014-10-30 (×2): qty 1

## 2014-10-30 MED ORDER — CHLORHEXIDINE GLUCONATE 4 % EX LIQD
60.0000 mL | Freq: Once | CUTANEOUS | Status: DC
  Filled 2014-10-30: qty 60

## 2014-10-30 MED ORDER — FENTANYL CITRATE (PF) 100 MCG/2ML IJ SOLN
INTRAMUSCULAR | Status: DC | PRN
  Administered 2014-10-30 (×2): 25 ug via INTRAVENOUS

## 2014-10-30 MED ORDER — MIDAZOLAM HCL 5 MG/5ML IJ SOLN
INTRAMUSCULAR | Status: DC | PRN
  Administered 2014-10-30 (×2): 1 mg via INTRAVENOUS

## 2014-10-30 MED ORDER — HYDRALAZINE HCL 50 MG PO TABS
75.0000 mg | ORAL_TABLET | Freq: Three times a day (TID) | ORAL | Status: DC
  Administered 2014-10-30 – 2014-10-31 (×3): 75 mg via ORAL
  Filled 2014-10-30 (×6): qty 1

## 2014-10-30 MED ORDER — ISOSORBIDE MONONITRATE ER 60 MG PO TB24
60.0000 mg | ORAL_TABLET | Freq: Every day | ORAL | Status: DC
  Administered 2014-10-31: 60 mg via ORAL
  Filled 2014-10-30: qty 1

## 2014-10-30 MED ORDER — MIDAZOLAM HCL 5 MG/5ML IJ SOLN
INTRAMUSCULAR | Status: AC
  Filled 2014-10-30: qty 25

## 2014-10-30 MED ORDER — ONDANSETRON HCL 4 MG/2ML IJ SOLN: 4.0000 mg | Freq: Four times a day (QID) | INTRAMUSCULAR | Status: DC | PRN

## 2014-10-30 MED ORDER — FUROSEMIDE 40 MG PO TABS
40.0000 mg | ORAL_TABLET | Freq: Every day | ORAL | Status: DC
  Administered 2014-10-31: 40 mg via ORAL
  Filled 2014-10-30: qty 1

## 2014-10-30 MED ORDER — MUPIROCIN 2 % EX OINT
1.0000 "application " | TOPICAL_OINTMENT | Freq: Once | CUTANEOUS | Status: DC
  Filled 2014-10-30: qty 22

## 2014-10-30 MED ORDER — CEFAZOLIN SODIUM-DEXTROSE 2-3 GM-% IV SOLR
2.0000 g | INTRAVENOUS | Status: AC
  Administered 2014-10-30: 2 g via INTRAVENOUS

## 2014-10-30 MED ORDER — LIDOCAINE HCL (PF) 1 % IJ SOLN
INTRAMUSCULAR | Status: DC | PRN
  Administered 2014-10-30: 35 mL

## 2014-10-30 MED ORDER — DIGOXIN 125 MCG PO TABS
0.1250 mg | ORAL_TABLET | Freq: Every day | ORAL | Status: DC
  Administered 2014-10-31: 0.125 mg via ORAL
  Filled 2014-10-30: qty 1

## 2014-10-30 MED ORDER — CEFAZOLIN SODIUM-DEXTROSE 2-3 GM-% IV SOLR
INTRAVENOUS | Status: AC
  Filled 2014-10-30: qty 50

## 2014-10-30 MED ORDER — ALPRAZOLAM 0.25 MG PO TABS
0.1250 mg | ORAL_TABLET | Freq: Every day | ORAL | Status: DC
  Administered 2014-10-31: 0.125 mg via ORAL
  Filled 2014-10-30 (×2): qty 1

## 2014-10-30 MED ORDER — SPIRONOLACTONE 12.5 MG HALF TABLET
12.5000 mg | ORAL_TABLET | Freq: Every day | ORAL | Status: DC
  Administered 2014-10-30 – 2014-10-31 (×2): 12.5 mg via ORAL
  Filled 2014-10-30 (×2): qty 1

## 2014-10-30 MED ORDER — SODIUM CHLORIDE 0.9 % IV SOLN: INTRAVENOUS | Status: AC

## 2014-10-30 MED ORDER — METFORMIN HCL 500 MG PO TABS
500.0000 mg | ORAL_TABLET | Freq: Two times a day (BID) | ORAL | Status: DC
  Filled 2014-10-30 (×2): qty 1

## 2014-10-30 MED ORDER — CARVEDILOL 12.5 MG PO TABS
12.5000 mg | ORAL_TABLET | Freq: Two times a day (BID) | ORAL | Status: DC
  Administered 2014-10-30 – 2014-10-31 (×2): 12.5 mg via ORAL
  Filled 2014-10-30 (×4): qty 1

## 2014-10-30 MED ORDER — LOSARTAN POTASSIUM 50 MG PO TABS
50.0000 mg | ORAL_TABLET | Freq: Every day | ORAL | Status: DC
  Administered 2014-10-31: 50 mg via ORAL
  Filled 2014-10-30: qty 1

## 2014-10-30 MED ORDER — ASPIRIN EC 81 MG PO TBEC
81.0000 mg | DELAYED_RELEASE_TABLET | Freq: Every day | ORAL | Status: DC
  Administered 2014-10-30 – 2014-10-31 (×2): 81 mg via ORAL
  Filled 2014-10-30 (×3): qty 1

## 2014-10-30 MED ORDER — GENTAMICIN SULFATE 40 MG/ML IJ SOLN
INTRAMUSCULAR | Status: AC
  Filled 2014-10-30: qty 2

## 2014-10-30 MED ORDER — FENTANYL CITRATE (PF) 100 MCG/2ML IJ SOLN
INTRAMUSCULAR | Status: AC
  Filled 2014-10-30: qty 4

## 2014-10-30 MED ORDER — SODIUM CHLORIDE 0.9 % IV SOLN: INTRAVENOUS | Status: DC

## 2014-10-30 MED ORDER — MUPIROCIN 2 % EX OINT
TOPICAL_OINTMENT | CUTANEOUS | Status: AC
Start: 2014-10-30 — End: 2014-10-30
  Filled 2014-10-30: qty 22

## 2014-10-30 MED ORDER — SODIUM CHLORIDE 0.9 % IR SOLN
80.0000 mg | Status: AC
  Administered 2014-10-30: 80 mg
  Filled 2014-10-30: qty 2

## 2014-10-30 MED ORDER — CEFAZOLIN SODIUM 1-5 GM-% IV SOLN
1.0000 g | Freq: Four times a day (QID) | INTRAVENOUS | Status: AC
  Administered 2014-10-30 – 2014-10-31 (×3): 1 g via INTRAVENOUS
  Filled 2014-10-30 (×4): qty 50

## 2014-10-30 MED ORDER — LEVOTHYROXINE SODIUM 50 MCG PO TABS
50.0000 ug | ORAL_TABLET | Freq: Every day | ORAL | Status: DC
  Administered 2014-10-31: 50 ug via ORAL
  Filled 2014-10-30 (×2): qty 1

## 2014-10-30 MED ORDER — GLIPIZIDE ER 5 MG PO TB24
5.0000 mg | ORAL_TABLET | Freq: Two times a day (BID) | ORAL | Status: DC
  Administered 2014-10-30 – 2014-10-31 (×2): 5 mg via ORAL
  Filled 2014-10-30 (×4): qty 1

## 2014-10-30 MED ORDER — HEPARIN (PORCINE) IN NACL 2-0.9 UNIT/ML-% IJ SOLN
INTRAMUSCULAR | Status: DC | PRN
  Administered 2014-10-30: 500 mL

## 2014-10-30 SURGICAL SUPPLY — 8 items
CABLE SURGICAL S-101-97-12 (CABLE) ×1 IMPLANT
ICD ELLIPSE DR CD2411-36Q (ICD Generator) ×1 IMPLANT
LEAD DURATA 7122Q-58CM (Lead) ×1 IMPLANT
LEAD TENDRIL SDX 1688TC-52CM (Lead) ×1 IMPLANT
PAD DEFIB LIFELINK (PAD) ×1 IMPLANT
SHEATH CLASSIC 7F (SHEATH) ×1 IMPLANT
SHEATH CLASSIC 8F (SHEATH) ×1 IMPLANT
TRAY PACEMAKER INSERTION (CUSTOM PROCEDURE TRAY) ×1 IMPLANT

## 2014-10-30 NOTE — Progress Notes (Signed)
Pacemaker site looks good with no evidence of swelling or drainage.

## 2014-10-30 NOTE — Discharge Summary (Signed)
ELECTROPHYSIOLOGY PROCEDURE DISCHARGE SUMMARY    Patient ID: Clayton Morton,  MRN: [id number], DOB/AGE: 1932-07-24 81 y.o.  Admit date: 10/30/2014 Discharge date: 10/31/2014  Primary Care Physician: Glenda Chroman., MD Primary Cardiologist: [facility] Electrophysiologist: Caryl Comes - pt prefers to follow in [facility] with Dr Rayann Heman post procedure  Primary Discharge Diagnosis:  Ischemic cardiomyopathy status post ICD implantation this admission  Secondary Discharge Diagnosis:  1.  Hypertension 2.  Prior CVA 3.  Macular degeneration 4.  CAD s/p CABG 5.  Hyperlipidemia 6.  Diabetes 7.  Renal insufficiency   Allergies  Allergen Reactions  . Tetracycline     Unknown reaction      Procedures This Admission:  1.  Implantation of a STJ dual chamber ICD on 10/30/14 by Dr Caryl Comes. See op note for full details. There were no immediate post procedure complications. 2.  CXR on 10/31/14 demonstrated no pneumothorax status post device implantation.   Brief HPI: Clayton Morton is a 81 y.o. male was referred to electrophysiology in the outpatient setting for consideration of ICD implantation.  Past medical history includes ischemic cardiomyopathy.  The patient has persistent LV dysfunction despite guideline directed therapy.  Risks, benefits, and alternatives to ICD implantation were reviewed with the patient who wished to proceed.   Hospital Course:  The patient was admitted and underwent implantation of a STJ dual chamber ICD with details as outlined above. He was monitored on telemetry overnight which demonstrated sinus rhythm with V pacing.  Left chest was without hematoma or ecchymosis.  The device was interrogated and found to be functioning normally.  CXR was obtained and demonstrated no pneumothorax status post device implantation.  Wound care, arm mobility, and restrictions were reviewed with the patient.  The patient was examined and considered stable for discharge to home.   The  patient's discharge medications include an ARB (Losartan) and beta blocker (Coreg).   Physical Exam: Filed Vitals:   10/30/14 1745 10/30/14 2027 10/30/14 2159 10/31/14 0527  BP: 134/52 148/47 143/54 136/57  Pulse: 50 54  56  Temp:  97.9 F (36.6 C)  98.4 F (36.9 C)  TempSrc:  Oral  Oral  Resp: 18 18  18   Height:      Weight:    154 lb 11.2 oz (70.171 kg)  SpO2:  99%  97%    GEN- The patient is well appearing, alert and oriented x 3 today.   HEENT: normocephalic, atraumatic; sclera clear, conjunctiva pink; hearing intact; oropharynx clear; neck supple  Lungs- Clear to ausculation bilaterally, normal work of breathing.  No wheezes, rales, rhonchi Heart- Regular rate and rhythm (paced) GI- soft, non-tender, non-distended, bowel sounds present  Extremities- no clubbing, cyanosis, or edema; DP/PT/radial pulses 2+ bilaterally MS- no significant deformity or atrophy Skin- warm and dry, no rash or lesion, left chest without hematoma, + ecchymosis Psych- euthymic mood, full affect Neuro- strength and sensation are intact   Labs:   Lab Results  Component Value Date   WBC 6.3 10/21/2014   HGB 12.8* 10/21/2014   HCT 38.1* 10/21/2014   MCV 95.2 10/21/2014   PLT 200.0 10/21/2014     Recent Labs Lab 10/31/14 0454  NA 141  K 3.6  CL 107  CO2 26  BUN 26*  CREATININE 1.67*  CALCIUM 8.4*  GLUCOSE 110*    Discharge Medications:    Medication List    TAKE these medications        ALPRAZolam 0.25 MG tablet  Commonly known  as:  XANAX  Take 0.5 tablets by mouth daily.     aspirin 81 MG tablet  Take 81 mg by mouth daily.     atorvastatin 80 MG tablet  Commonly known as:  LIPITOR  Take 1 tablet (80 mg total) by mouth daily at 6 PM.     carvedilol 6.25 MG tablet  Commonly known as:  COREG  Take 1 tablet (6.25 mg total) by mouth 2 (two) times daily with a meal.     digoxin 0.125 MG tablet  Commonly known as:  LANOXIN  Take 1 tablet (0.125 mg total) by mouth daily.       Fish Oil 1000 MG Caps  Take 1 capsule by mouth 2 (two) times daily.     furosemide 40 MG tablet  Commonly known as:  LASIX  Take 1 tablet (40 mg total) by mouth daily. Or as instructed by provider     glipiZIDE 5 MG 24 hr tablet  Commonly known as:  GLUCOTROL XL  Take 1 tablet by mouth 2 (two) times daily.     hydrALAZINE 50 MG tablet  Commonly known as:  APRESOLINE  Take 1.5 tablets (75 mg total) by mouth 3 (three) times daily.     isosorbide mononitrate 60 MG 24 hr tablet  Commonly known as:  IMDUR  Take 1 tablet (60 mg total) by mouth daily.     levothyroxine 50 MCG tablet  Commonly known as:  SYNTHROID, LEVOTHROID  Take 50 mcg by mouth every morning.     losartan 50 MG tablet  Commonly known as:  COZAAR  Take 1 tablet (50 mg total) by mouth daily.     metFORMIN 500 MG tablet  Commonly known as:  GLUCOPHAGE  Take 500 mg by mouth 2 (two) times daily with a meal.     ONE TOUCH ULTRA TEST test strip  Generic drug:  glucose blood  1 each by Other route 2 (two) times daily.     potassium chloride SA 20 MEQ tablet  Commonly known as:  K-DUR,KLOR-CON  Take 1 tablet (20 mEq total) by mouth as needed. TAKE ONLY IF TALKING LASIX     spironolactone 25 MG tablet  Commonly known as:  ALDACTONE  Take 0.5 tablets (12.5 mg total) by mouth daily.     traZODone 50 MG tablet  Commonly known as:  DESYREL  Take 50 mg by mouth at bedtime as needed for sleep. For sleep     Vitamin B-12 1000 MCG Subl  Place 1,000 mcg under the tongue 3 (three) times a week.        Disposition:  Discharge Instructions    Diet - low sodium heart healthy    Complete by:  As directed      Increase activity slowly    Complete by:  As directed           Follow-up Information    Follow up with [facility] On 11/08/2014.   Why:  at 10:30AM for wound check   Contact information:   [address] [place] [phone]       Follow up with Thompson Grayer, MD On 02/21/2015.    Specialty:  Cardiology   Why:  at 8:45AM   Contact information:   [place] [address] [phone]       Duration of Discharge Encounter: Greater than 30 minutes including physician time.  Signed, Chanetta Marshall, NP 10/31/2014 6:51 AM  Have reprogramed device with neg  hysterisis to try and facilitate fusion (-50 msec)  followup as noted   Renal function has improved, but this will require close attension as he is on multiple high risk medications, including aldactone and digoxin  Will check dig level today prior to discharge

## 2014-10-30 NOTE — Progress Notes (Signed)
Patient arrived from the cath lab awake and oriented.  Scant amount of blood on the dressing was marked from the procedure.  The patient ambulated to and back from the bathroom with minimal distress.  The patient eat dinner and had no issues.

## 2014-10-30 NOTE — H&P (View-Only) (Signed)
Electrophysiology Office Note Date: 10/21/2014  ID:  Clayton Morton, DOB [birth year], MRN [id number]  PCP: Glenda Chroman., MD Primary Cardiologist: Aundra Dubin Electrophysiologist: Caryl Comes  CC: discuss ICD implantation  Clayton Morton is a 81 y.o. male seen today for Dr Caryl Comes. He was previously evaluated by Dr Caryl Comes for consideration of ICD implant.  His medications were optimized and repeat echo showed persistently decreased EF.  He presents today to discuss ICD implantation. Since last being seen in our clinic, the patient reports doing very well.  He denies chest pain, palpitations, dyspnea, PND, orthopnea, nausea, vomiting, dizziness, syncope, edema, weight gain, or early satiety.  Past Medical History  Diagnosis Date  . Essential hypertension, benign   . Cerebrovascular accident     Right subcortical infarct 2006  . Cataract   . Macular degeneration     Dr. [name]  . Coronary atherosclerosis of native coronary artery     1) CABG 2005 2) LHC (910/15): patent SVG to PDA, occlusion of SVG sequence to PLA, patency of LIMA to LAD and patency of SVG to diagonal with sev stenosis at coronary anastomotic site  . Hyperlipidemia   . Varicose veins   . Diabetes mellitus, type 2   . Non-ST elevation MI (NSTEMI)     2005  . Ischemic cardiomyopathy   . Chronic systolic congestive heart failure     a) ECHO (02/2012): EF 30-35%, no obvious thrombus b) ECHO (11/2013): EF 10%, AV mildly calcified, mild MR, RV mod dialted and sys fx sev reduced, TAPSE 0.8, lateral annulus peak S velocity 5.4 c) RHC (11/29/13): RA 15, RV: 65/19, PA 49%, CO/CI: 3.25 / 1.7 d) RHC (12/03/13): RA 1, RV 25/1/2, PA 27/10 (16), PCWP 6, Fick CO/CI: 5.3 / 2.9, PVR 1.9 WU, PA sat 72 and 74%    Past Surgical History  Procedure Laterality Date  . Coronary artery bypass graft  2005    LIMA to LAD, SVG diagonal, SVG to PDA and PLA  . Left and right heart catheterization with coronary/graft angiogram N/A 11/29/2013    Procedure: LEFT  AND RIGHT HEART CATHETERIZATION WITH Beatrix Fetters;  Surgeon: Blane Ohara, MD;  Location[facility] CATH LAB;  Service: Cardiovascular;  Laterality: N/A;  . Right heart catheterization N/A 12/03/2013    Procedure: RIGHT HEART CATH;  Surgeon: Jolaine Artist, MD;  Location: [facility] CATH LAB;  Service: Cardiovascular;  Laterality: N/A;    Current Outpatient Prescriptions  Medication Sig Dispense Refill  . allopurinol (ZYLOPRIM) 100 MG tablet Take 100 mg by mouth as needed.    . ALPRAZolam (XANAX) 0.25 MG tablet Take 0.5 tablets by mouth daily.  0  . aspirin 81 MG tablet Take 81 mg by mouth daily.    [name] atorvastatin (LIPITOR) 80 MG tablet Take 1 tablet (80 mg total) by mouth daily at 6 PM. 90 tablet 1  . carvedilol (COREG) 6.25 MG tablet Take 1 tablet (6.25 mg total) by mouth 2 (two) times daily with a meal. 180 tablet 1  . Cyanocobalamin (VITAMIN B-12) 1000 MCG SUBL Place 1,000 mcg under the tongue 3 (three) times a week.    . digoxin (LANOXIN) 0.125 MG tablet Take 1 tablet (0.125 mg total) by mouth daily. 30 tablet 11  . furosemide (LASIX) 40 MG tablet Take 1 tablet (40 mg total) by mouth daily. Or as instructed by provider 90 tablet 1  . glipiZIDE (GLUCOTROL XL) 5 MG 24 hr tablet Take 1 tablet by mouth 2 (two) times  daily.  1  . hydrALAZINE (APRESOLINE) 50 MG tablet Take 1.5 tablets (75 mg total) by mouth 3 (three) times daily. 405 tablet 1  . isosorbide mononitrate (IMDUR) 60 MG 24 hr tablet Take 1 tablet (60 mg total) by mouth daily. 90 tablet 1  . levothyroxine (SYNTHROID, LEVOTHROID) 50 MCG tablet Take 50 mcg by mouth daily.    [name] losartan (COZAAR) 50 MG tablet Take 1 tablet (50 mg total) by mouth daily. 90 tablet 1  . metFORMIN (GLUCOPHAGE) 500 MG tablet Take 500 mg by mouth 2 (two) times daily with a meal.    . Omega-3 Fatty Acids (FISH OIL) 1000 MG CAPS Take 1 capsule by mouth 2 (two) times daily.    . ONE TOUCH ULTRA TEST test strip   0  . potassium chloride SA (K-DUR,KLOR-CON) 20  MEQ tablet Take 1 tablet (20 mEq total) by mouth as needed. TAKE ONLY IF TALKING LASIX 90 tablet 1  . spironolactone (ALDACTONE) 25 MG tablet Take 0.5 tablets (12.5 mg total) by mouth daily. 45 tablet 1  . traZODone (DESYREL) 50 MG tablet Take 50 mg by mouth every evening. For sleep  0   No current facility-administered medications for this visit.    Allergies:   Tetracycline   Social History: History   Social History  . Marital Status: Married    Spouse Name: N/A  . Number of Children: N/A  . Years of Education: N/A   Occupational History  . Not on file.   Social History Main Topics  . Smoking status: Never Smoker   . Smokeless tobacco: Never Used  . Alcohol Use: No  . Drug Use: No  . Sexual Activity: Not on file   Other Topics Concern  . 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 Lives in [place] with wife.     Family History: Family History  Problem Relation Age of Onset  . Hypertension Father     CAD, CHF; deceased at 32  . Dementia Mother     deceased at age 3    Review of Systems: All other systems reviewed and are otherwise negative except as noted above.   Physical Exam: VS:  BP 130/62 mmHg  Pulse 63  Ht 5\' 8"  (1.727 m)  Wt 157 lb 3.2 oz (71.305 kg)  BMI 23.91 kg/m2 , BMI Body mass index is 23.91 kg/(m^2). Wt Readings from Last 3 Encounters:  10/21/14 157 lb 3.2 oz (71.305 kg)  09/02/14 155 lb 12 oz (70.648 kg)  08/05/14 160 lb 9.6 oz (72.848 kg)    GEN- The patient is well appearing, alert and oriented x 3 today.   HEENT: normocephalic, atraumatic; sclera clear, conjunctiva pink; hearing intact; oropharynx clear; neck supple  Lungs- Clear to ausculation bilaterally, normal work of breathing.  No wheezes, rales, rhonchi Heart- Regular rate and rhythm, no murmurs, rubs or gallops  GI- soft, non-tender, non-distended, bowel sounds present  Extremities- no clubbing, cyanosis, or edema  MS- no significant  deformity or atrophy Skin- warm and dry, no rash or lesion  Psych- euthymic mood, full affect Neuro- strength and sensation are intact   EKG:  EKG is ordered today. The ekg ordered today shows sinus rhythm, 1st degree AV block, rate 63, RBBB  Recent Labs: 11/26/2013: ALT 17 11/29/2013: Hemoglobin 12.9*; Platelets 210 12/01/2013: TSH 0.306* 12/24/2013: Pro B Natriuretic peptide (BNP) 5332.0* 09/13/2014: BNP 834.2*; BUN 28*; Creatinine, Ser 1.46*; Potassium 4.3; Sodium  145*    Other studies Reviewed: Additional studies/ records that were reviewed today include: Dr Olin Pia notes, AHF clinic notes, recent echo  Assessment and Plan: 1.  ICM/chronic systolic heart failure The patient has a persistently decreased EF despite guideline directed therapy. Risks, benefits of ICD implantation were discussed with the patient today who wishes to proceed. Will schedule with Dr Caryl Comes at the next available time.   2.  CAD s/p CABG No recent ischemic symptoms Continue medical therapy  3.  HTN Stable No change required today  4.  CKD, stage III Stable No change required today    Current medicines are reviewed at length with the patient today.   The patient does not have concerns regarding his medicines.  The following changes were made today:  none  Labs/ tests ordered today include: pre-procedure labs   Disposition:   Follow up with Dr Caryl Comes following ICD implantation   Signed, Chanetta Marshall, NP 10/21/2014 10:54 AM   [facility] [address] [place] [address] [phone] (office) [phone] (fax)

## 2014-10-30 NOTE — Interval H&P Note (Signed)
ICD Criteria  Current LVEF:25%. Within 12 months prior to implant: Yes   Heart failure history: Yes, Class II  Cardiomyopathy history: Yes, Ischemic Cardiomyopathy.  Atrial Fibrillation/Atrial Flutter: No.  Ventricular tachycardia history: No.  Cardiac arrest history: No.  History of syndromes with risk of sudden death: No.  Previous ICD: No.  Current ICD indication: Primary  PPM indication: No.  Beta Blocker therapy for 3 or more months: Yes, prescribed.   Ace Inhibitor/ARB therapy for 3 or more months: Yes, prescribed.   History and Physical Interval Note:  10/30/2014 12:28 PM  Clayton Morton  has presented today for surgery, with the diagnosis of Cardiomyopathy  The various methods of treatment have been discussed with the patient and family. After consideration of risks, benefits and other options for treatment, the patient has consented to  Procedure(s): ICD Implant (N/A) as a surgical intervention .  The patient's history has been reviewed, patient examined, no change in status, stable for surgery.  I have reviewed the patient's chart and labs.  Questions were answered to the patient's satisfaction.     [name]   Cr eelvated 1.4>>1.95  Will recheck Cr in am

## 2014-10-30 NOTE — Interval H&P Note (Signed)
History and Physical Interval Note:  10/30/2014 12:32 PM  Clayton Morton  has presented today for surgery, with the diagnosis of Cardiomyopathy  The various methods of treatment have been discussed with the patient and family. After consideration of risks, benefits and other options for treatment, the patient has consented to  Procedure(s): ICD Implant (N/A) as a surgical intervention .  The patient's history has been reviewed, patient examined, no change in status, stable for surgery.  I have reviewed the patient's chart and labs.  Questions were answered to the patient's satisfaction.     [name]  Will place dual chamber device  The pt has broad RBBB and 1 AVB and there is a good likelihood that by P-synchronous/ AV  Pacing we can improve cardiac performance even without the placing of LV lead

## 2014-10-31 ENCOUNTER — Ambulatory Visit (HOSPITAL_COMMUNITY): Payer: Medicare Other

## 2014-10-31 ENCOUNTER — Encounter (HOSPITAL_COMMUNITY): Payer: Self-pay | Admitting: Internal Medicine

## 2014-10-31 DIAGNOSIS — I255 Ischemic cardiomyopathy: Secondary | ICD-10-CM | POA: Diagnosis not present

## 2014-10-31 DIAGNOSIS — I5022 Chronic systolic (congestive) heart failure: Secondary | ICD-10-CM | POA: Diagnosis not present

## 2014-10-31 LAB — BASIC METABOLIC PANEL
Anion gap: 8 (ref 5–15)
BUN: 26 mg/dL — ABNORMAL HIGH (ref 6–20)
CO2: 26 mmol/L (ref 22–32)
Calcium: 8.4 mg/dL — ABNORMAL LOW (ref 8.9–10.3)
Chloride: 107 mmol/L (ref 101–111)
Creatinine, Ser: 1.67 mg/dL — ABNORMAL HIGH (ref 0.61–1.24)
GFR calc Af Amer: 43 mL/min — ABNORMAL LOW (ref >60)
GFR calc non Af Amer: 37 mL/min — ABNORMAL LOW (ref >60)
Glucose, Bld: 110 mg/dL — ABNORMAL HIGH (ref 65–99)
Potassium: 3.6 mmol/L (ref 3.5–5.1)
Sodium: 141 mmol/L (ref 135–145)

## 2014-10-31 NOTE — Discharge Instructions (Signed)
° ° °  Supplemental Discharge Instructions for  Pacemaker/Defibrillator Patients  Activity No heavy lifting or vigorous activity with your left/right arm for 6 to 8 weeks.  Do not raise your left/right arm above your head for one week.  Gradually raise your affected arm as drawn below.           __       11/03/14                 11/04/14                    11/05/14                  11/06/14  NO DRIVING for 1 week    ; you may begin driving on   [id number]  .  WOUND CARE - Keep the wound area clean and dry.  Don't get the incision wet for 1 week.  - The tape/steri-strips on your wound will fall off; do not pull them off.  No bandage is needed on the site.  DO  NOT apply any creams, oils, or ointments to the wound area. - If you notice any drainage or discharge from the wound, any swelling or bruising at the site, or you develop a fever > 101? F after you are discharged home, call the office at once.  Special Instructions - You are still able to use cellular telephones; use the ear opposite the side where you have your pacemaker/defibrillator.  Avoid carrying your cellular phone near your device. - When traveling through airports, show security personnel your identification card to avoid being screened in the metal detectors.  Ask the security personnel to use the hand wand. - Avoid arc welding equipment, MRI testing (magnetic resonance imaging), TENS units (transcutaneous nerve stimulators).  Call the office for questions about other devices. - Avoid electrical appliances that are in poor condition or are not properly grounded. - Microwave ovens are safe to be near or to operate.  Additional information for defibrillator patients should your device go off: - If your device goes off ONCE and you feel fine afterward, notify the device clinic nurses. - If your device goes off ONCE and you do not feel well afterward, call 911. - If your device goes off TWICE, call 911. - If your device goes off THREE  times in one day, call 911.  DO NOT DRIVE YOURSELF OR A FAMILY MEMBER WITH A DEFIBRILLATOR TO THE HOSPITAL--CALL 911.

## 2014-11-08 ENCOUNTER — Encounter: Payer: Self-pay | Admitting: Internal Medicine

## 2014-11-08 ENCOUNTER — Ambulatory Visit (INDEPENDENT_AMBULATORY_CARE_PROVIDER_SITE_OTHER): Payer: Medicare Other | Admitting: *Deleted

## 2014-11-08 DIAGNOSIS — I4510 Unspecified right bundle-branch block: Secondary | ICD-10-CM

## 2014-11-08 DIAGNOSIS — I5022 Chronic systolic (congestive) heart failure: Secondary | ICD-10-CM

## 2014-11-08 DIAGNOSIS — R001 Bradycardia, unspecified: Secondary | ICD-10-CM | POA: Diagnosis not present

## 2014-11-08 DIAGNOSIS — I255 Ischemic cardiomyopathy: Secondary | ICD-10-CM

## 2014-11-08 DIAGNOSIS — I5023 Acute on chronic systolic (congestive) heart failure: Secondary | ICD-10-CM

## 2014-11-08 LAB — CUP PACEART INCLINIC DEVICE CHECK
Battery Remaining Longevity: 81.6 mo
Brady Statistic RA Percent Paced: 3.2 %
Brady Statistic RV Percent Paced: 96 %
Date Time Interrogation Session: 20160819112445
HighPow Impedance: 54 Ohm
Lead Channel Impedance Value: 387.5 Ohm
Lead Channel Impedance Value: 462.5 Ohm
Lead Channel Pacing Threshold Amplitude: 0.75 V
Lead Channel Pacing Threshold Amplitude: 0.75 V
Lead Channel Pacing Threshold Pulse Width: 0.5 ms
Lead Channel Pacing Threshold Pulse Width: 0.5 ms
Lead Channel Sensing Intrinsic Amplitude: 1.3 mV
Lead Channel Sensing Intrinsic Amplitude: 11.7 mV
Lead Channel Setting Pacing Amplitude: 0.875
Lead Channel Setting Pacing Amplitude: 1.75 V
Lead Channel Setting Pacing Pulse Width: 0.5 ms
Lead Channel Setting Sensing Sensitivity: 0.5 mV
Pulse Gen Serial Number: 7179751
Zone Setting Detection Interval: 250 ms
Zone Setting Detection Interval: 300 ms

## 2014-11-08 NOTE — Progress Notes (Signed)
ICD wound check appt. Steri-strips removed. Wound well healed without redness or edema. Bruising noted, but dissipating, no hematoma. Incision edges approximated, well healed. Normal device function. Thresholds, sensing and impedances stable. Device programmed with autocap on. 91 mode switches- longest 2:14, pk A 179, pk V 110. No ventricular arrhythmias. Pt educated about wound care and activity restrictions as well as shock plan. ROV with JA in [facility] 02/21/15. EKG obtained.

## 2014-11-13 ENCOUNTER — Ambulatory Visit: Payer: Medicare Other

## 2014-11-15 NOTE — Addendum Note (Signed)
Addended by: Freada Bergeron on: 11/15/2014 04:48 PM   Modules accepted: Orders

## 2014-12-12 ENCOUNTER — Ambulatory Visit (INDEPENDENT_AMBULATORY_CARE_PROVIDER_SITE_OTHER): Payer: Medicare Other | Admitting: Cardiology

## 2014-12-12 ENCOUNTER — Encounter: Payer: Self-pay | Admitting: Cardiology

## 2014-12-12 ENCOUNTER — Encounter: Payer: Self-pay | Admitting: *Deleted

## 2014-12-12 VITALS — BP 100/54 | HR 80 | Ht 68.0 in | Wt 157.0 lb

## 2014-12-12 DIAGNOSIS — I2510 Atherosclerotic heart disease of native coronary artery without angina pectoris: Secondary | ICD-10-CM | POA: Diagnosis not present

## 2014-12-12 DIAGNOSIS — I5022 Chronic systolic (congestive) heart failure: Secondary | ICD-10-CM

## 2014-12-12 DIAGNOSIS — N183 Chronic kidney disease, stage 3 unspecified: Secondary | ICD-10-CM

## 2014-12-12 DIAGNOSIS — I255 Ischemic cardiomyopathy: Secondary | ICD-10-CM

## 2014-12-12 NOTE — Patient Instructions (Signed)
Your physician recommends that you continue on your current medications as directed. Please refer to the Current Medication list given to you today. Your physician recommends that you schedule a follow-up appointment in: 6 months with [name]. You will receive a reminder letter in the mail in about 4 months reminding you to call and schedule your appointment. If you don't receive this letter, please contact our office. Your physician recommends that you schedule a follow-up appointment in: 3 months in the Heart Failure Clinic.

## 2014-12-12 NOTE — Progress Notes (Signed)
Cardiology Office Note  Date: 12/12/2014   ID: Clayton Morton, DOB 12-Sep-1932, MRN [id number]  PCP: Glenda Chroman., MD  Primary Cardiologist: Rozann Lesches, MD   Chief Complaint  Patient presents with  . Hospitalization Follow-up  . Cardiomyopathy    History of Present Illness: Clayton Morton is a 81 y.o. male that I have not seen in the office in September 2015, although have reviewed his follow-up testing and evaluation in the Advanced Heart Failure clinic and more recently in the EP clinic by Dr. Caryl Comes.  He saw Dr. Aundra Dubin in June, and was just recently discharged from [facility] in August after undergoing implantation of a St. Jude dual-chamber ICD.  He tells me that he feels very well these days, NYHA class II dyspnea, more energy, no palpitations or angina. He plans to try to play golf later this week.  He reports stable weights at home, confirmed by our scales, compliance with his medications. He follows a low-sodium diet.  I reviewed his recent lab work which is outlined below.   Past Medical History  Diagnosis Date  . Essential hypertension, benign   . Cerebrovascular accident     Right subcortical infarct 2006  . Macular degeneration     Dr. [name]  . Coronary atherosclerosis of native coronary artery     1) CABG 2005 2) LHC (910/15): patent SVG to PDA, occlusion of SVG sequence to PLA, patency of LIMA to LAD and patency of SVG to diagonal with sev stenosis at coronary anastomotic site  . Hyperlipidemia   . Varicose veins   . Non-ST elevation MI (NSTEMI)     2005  . Ischemic cardiomyopathy   . Chronic systolic congestive heart failure     a) ECHO (02/2012): EF 30-35%, no obvious thrombus b) ECHO (11/2013): EF 10%, AV mildly calcified, mild MR, RV mod dialted and sys fx sev reduced, TAPSE 0.8, lateral annulus peak S velocity 5.4 c) RHC (11/29/13): RA 15, RV: 65/19, PA 49%, CO/CI: 3.25 / 1.7 d) RHC (12/03/13): RA 1, RV 25/1/2, PA 27/10 (16), PCWP 6, Fick CO/CI:  5.3 / 2.9, PVR 1.9 WU, PA sat 72 and 74%   . Kidney stones   . AICD (automatic cardioverter/defibrillator) present 10/30/2014  . Diabetes mellitus, type 2   . History of gout   . Age-related macular degeneration, wet, right eye     Past Surgical History  Procedure Laterality Date  . Coronary artery bypass graft  2005    LIMA to LAD, SVG diagonal, SVG to PDA and PLA  . Left and right heart catheterization with coronary/graft angiogram N/A 11/29/2013    Procedure: LEFT AND RIGHT HEART CATHETERIZATION WITH Beatrix Fetters;  Surgeon: Blane Ohara, MD;  Location: [facility] CATH LAB;  Service: Cardiovascular;  Laterality: N/A;  . Right heart catheterization N/A 12/03/2013    Procedure: RIGHT HEART CATH;  Surgeon: Jolaine Artist, MD;  Location: [facility] CATH LAB;  Service: Cardiovascular;  Laterality: N/A;  . Cardiac catheterization    . Tonsillectomy    . Cystoscopy w/ litholapaxy / ehl    . Cataract extraction w/ intraocular lens  implant, bilateral Bilateral ~ 1991  . Coronary angioplasty    . Cardiac defibrillator placement  10/30/2014  . Ep implantable device N/A 10/30/2014    Procedure: ICD Implant;  Surgeon: Deboraha Sprang, MD;  Location: [facility] CV LAB;  Service: Cardiovascular;  Laterality: N/A;    Current Outpatient Prescriptions  Medication Sig Dispense Refill  .  ALPRAZolam (XANAX) 0.25 MG tablet Take 0.5 tablets by mouth daily.  0  . aspirin 81 MG tablet Take 81 mg by mouth daily.    [name] atorvastatin (LIPITOR) 80 MG tablet Take 1 tablet (80 mg total) by mouth daily at 6 PM. 90 tablet 1  . carvedilol (COREG) 6.25 MG tablet Take 1 tablet (6.25 mg total) by mouth 2 (two) times daily with a meal. 180 tablet 1  . Cyanocobalamin (VITAMIN B-12) 1000 MCG SUBL Place 1,000 mcg under the tongue 3 (three) times a week.    . digoxin (LANOXIN) 0.125 MG tablet Take 1 tablet (0.125 mg total) by mouth daily. 30 tablet 11  . furosemide (LASIX) 40 MG tablet Take 40 mg by mouth as needed.    [name]  glipiZIDE (GLUCOTROL XL) 5 MG 24 hr tablet Take 1 tablet by mouth 2 (two) times daily.  1  . hydrALAZINE (APRESOLINE) 50 MG tablet Take 1.5 tablets (75 mg total) by mouth 3 (three) times daily. 405 tablet 1  . isosorbide mononitrate (IMDUR) 60 MG 24 hr tablet Take 1 tablet (60 mg total) by mouth daily. 90 tablet 1  . levothyroxine (SYNTHROID, LEVOTHROID) 50 MCG tablet Take 50 mcg by mouth every morning.     [name] losartan (COZAAR) 50 MG tablet Take 1 tablet (50 mg total) by mouth daily. 90 tablet 1  . metFORMIN (GLUCOPHAGE) 500 MG tablet Take 500 mg by mouth 2 (two) times daily with a meal.    . Omega-3 Fatty Acids (FISH OIL) 1000 MG CAPS Take 1 capsule by mouth 2 (two) times daily.    . ONE TOUCH ULTRA TEST test strip 1 each by Other route 2 (two) times daily.   0  . potassium chloride SA (K-DUR,KLOR-CON) 20 MEQ tablet Take 1 tablet (20 mEq total) by mouth as needed. TAKE ONLY IF TALKING LASIX 90 tablet 1  . spironolactone (ALDACTONE) 25 MG tablet Take 0.5 tablets (12.5 mg total) by mouth daily. 45 tablet 1  . traZODone (DESYREL) 50 MG tablet Take 50 mg by mouth at bedtime as needed for sleep. For sleep  0   No current facility-administered medications for this visit.    Allergies:  Tetracycline   Social History: The patient  reports that he has never smoked. He has never used smokeless tobacco. He reports that he does not drink alcohol or use illicit drugs.   ROS:  Please see the history of present illness. Otherwise, complete review of systems is positive for decreased hearing.  Good appetite, no syncope. All other systems are reviewed and negative.   Physical Exam: VS:  BP 100/54 mmHg  Pulse 80  Ht 5\' 8"  (1.727 m)  Wt 157 lb (71.215 kg)  BMI 23.88 kg/m2  SpO2 96%, BMI Body mass index is 23.88 kg/(m^2).  Wt Readings from Last 3 Encounters:  12/12/14 157 lb (71.215 kg)  10/31/14 154 lb 11.2 oz (70.171 kg)  10/21/14 157 lb 3.2 oz (71.305 kg)     General: Patient appears comfortable at  rest. HEENT: Conjunctiva and lids normal, oropharynx clear. Neck: Supple, no elevated JVP or carotid bruits, no thyromegaly. Thorax: Well-healed device pocket site on the left. Lungs: Clear to auscultation, nonlabored breathing at rest. Cardiac: Regular rate and rhythm, no S3. Abdomen: Soft, nontender, bowel sounds present. Extremities: No pitting edema, distal pulses 2+. Skin: Warm and dry. Musculoskeletal: No kyphosis. Neuropsychiatric: Alert and oriented x3, affect grossly appropriate.   ECG: ECG is not ordered today.   Recent Labwork:  12/24/2013: Pro B Natriuretic peptide (BNP) 5332.0* 09/13/2014: BNP 834.2* 10/21/2014: Hemoglobin 12.8*; Platelets 200.0 10/31/2014: BUN 26*; Creatinine, Ser 1.67*; Potassium 3.6; Sodium 141     Component Value Date/Time   CHOL 237* 05/27/2014 1230   TRIG 181* 05/27/2014 1230   HDL 37* 05/27/2014 1230   CHOLHDL 6.4 05/27/2014 1230   VLDL 36 05/27/2014 1230   LDLCALC 164* 05/27/2014 1230    Other Studies Reviewed Today:  Echocardiogram 08/28/2014: Study Conclusions  - Left ventricle: The cavity size was mildly to moderately dilated. There was moderate concentric hypertrophy. Systolic function was severely reduced. The estimated ejection fraction was in the range of 25% to 30%. Doppler parameters are consistent with abnormal left ventricular relaxation (grade 1 diastolic dysfunction). - Regional wall motion abnormality: Akinesis of the mid inferior myocardium; severe hypokinesis of the mid inferoseptal, basal inferior, and basal-mid inferolateral myocardium; moderate hypokinesis of the mid anteroseptal and apical inferior myocardium. The remaining walls are moderately hypokinetic. - Ventricular septum: Septal motion showed abnormal function and dyssynergy. These changes are consistent with intraventricular conduction delay. - Aortic valve: Mildly to moderately calcified annulus. Trileaflet; moderately thickened,  mildly calcified leaflets. There was no stenosis. There was trivial regurgitation. - Mitral valve: There was mild to moderate regurgitation. - Left atrium: The atrium was severely dilated. Volume/bsa ES (A/L): 44.2 ml/m^2. - Right ventricle: Systolic function was moderately reduced. - Pulmonic valve: There was mild regurgitation.  Assessment and Plan:  1. Ischemic cardiomyopathy with LVEF 25-30%, symptomatically stable with chronic combined heart failure. No changes are being made to his current regimen. He had recent lab work which was reviewed. Keep follow-up in the Heart Failure clinic in the next 3 months, I will see him after that.  2. Recent elective St. Jude dual-chamber ICD placement by Dr. Caryl Comes. Tolerated well. No device shocks.  3.  Ischemic heart disease status post previous CABG, cardiac catheterization in September of last year noted above. No active angina symptoms.  4. CKD, stage 3,  Recent creatinine 1.6 relatively stable.  Current medicines were reviewed with the patient today.  Disposition: FU with me in 6 months.   Signed, Satira Sark, MD, [facility] 12/12/2014 11:11 AM    [facility] [place], [place], [place] [address] Phone: [phone]; Fax: [phone]

## 2015-02-03 ENCOUNTER — Telehealth: Payer: Self-pay | Admitting: Internal Medicine

## 2015-02-03 NOTE — Telephone Encounter (Signed)
Pt says last night for 2 hours felt profoundly weak while dressing to go to church, HR dropped to 50 BP 124/75, pt denied any CP/SOB/dizziness during that time and weakness only lasted for 2 hours. HR remained at 65 at bedtime last night and this AM. Pt says he feels fine this morning and denies any symptoms, just wanted Dr Domenic Polite aware. Pt has appt with [name] 02/21/15 for pacer check. Will forward

## 2015-02-03 NOTE — Telephone Encounter (Signed)
Error

## 2015-02-03 NOTE — Telephone Encounter (Signed)
Clayton Morton calls today stating that yesterday while getting dressed he became very weak and started sweating.  [name] continued taking his BP. And states after about 4 hours he started feeling some better. States that today he is feeling ok but thinks that his pacemaker needs to have an  Adjustment .

## 2015-02-05 ENCOUNTER — Encounter: Payer: Medicare Other | Admitting: Internal Medicine

## 2015-02-21 ENCOUNTER — Encounter: Payer: Self-pay | Admitting: Internal Medicine

## 2015-02-21 ENCOUNTER — Ambulatory Visit (INDEPENDENT_AMBULATORY_CARE_PROVIDER_SITE_OTHER): Payer: Medicare Other | Admitting: Internal Medicine

## 2015-02-21 VITALS — BP 128/60 | HR 64 | Ht 68.0 in | Wt 157.0 lb

## 2015-02-21 DIAGNOSIS — I10 Essential (primary) hypertension: Secondary | ICD-10-CM | POA: Diagnosis not present

## 2015-02-21 DIAGNOSIS — I255 Ischemic cardiomyopathy: Secondary | ICD-10-CM

## 2015-02-21 NOTE — Patient Instructions (Signed)
Your physician recommends that you continue on your current medications as directed. Please refer to the Current Medication list given to you today. Next device check on 05/26/15. Your physician recommends that you schedule a follow-up appointment in: 1 year with [name]. You can schedule this appointment today or you can wait for your letter to come in the mail in about 10 months reminding you to call and schedule this appointment. If you do not receive this letter, please contact our office for your appointment.

## 2015-02-21 NOTE — Progress Notes (Signed)
Electrophysiology Office Note   Date:  02/21/2015   ID:  HOLGER ARLINGHAUS, DOB 11/30/1932, MRN [id number]  PCP:  Glenda Chroman., MD  Cardiologist:  Dr Domenic Polite Followed in CHF clinic also Primary Electrophysiologist: Thompson Grayer, MD    CC: fatigue   History of Present Illness: Clayton Morton is a 81 y.o. male who presents today for electrophysiology evaluation.   Doing well s/p ICD implantation for primary prevention of SCD by Dr Caryl Comes 8/16.  Fatigue has improved with pacing.  No new concerns today.  Today, he denies symptoms of palpitations, chest pain, shortness of breath, orthopnea, PND, lower extremity edema, claudication, dizziness, presyncope, syncope, bleeding, or neurologic sequela. The patient is tolerating medications without difficulties and is otherwise without complaint today.    Past Medical History  Diagnosis Date  . Essential hypertension, benign   . Cerebrovascular accident [facility])     Right subcortical infarct 2006  . Macular degeneration     Dr. [name]  . Coronary atherosclerosis of native coronary artery     1) CABG 2005 2) LHC (910/15): patent SVG to PDA, occlusion of SVG sequence to PLA, patency of LIMA to LAD and patency of SVG to diagonal with sev stenosis at coronary anastomotic site  . Hyperlipidemia   . Varicose veins   . Non-ST elevation MI (NSTEMI) ([place])     2005  . Ischemic cardiomyopathy   . Chronic systolic congestive heart failure ([place])     a) ECHO (02/2012): EF 30-35%, no obvious thrombus b) ECHO (11/2013): EF 10%, AV mildly calcified, mild MR, RV mod dialted and sys fx sev reduced, TAPSE 0.8, lateral annulus peak S velocity 5.4 c) RHC (11/29/13): RA 15, RV: 65/19, PA 49%, CO/CI: 3.25 / 1.7 d) RHC (12/03/13): RA 1, RV 25/1/2, PA 27/10 (16), PCWP 6, Fick CO/CI: 5.3 / 2.9, PVR 1.9 WU, PA sat 72 and 74%   . Kidney stones   . AICD (automatic cardioverter/defibrillator) present 10/30/2014  . Diabetes mellitus, type 2 ([place])   . History of gout   .  Age-related macular degeneration, wet, right eye [facility])    Past Surgical History  Procedure Laterality Date  . Coronary artery bypass graft  2005    LIMA to LAD, SVG diagonal, SVG to PDA and PLA  . Left and right heart catheterization with coronary/graft angiogram N/A 11/29/2013    Procedure: LEFT AND RIGHT HEART CATHETERIZATION WITH Beatrix Fetters;  Surgeon: Blane Ohara, MD;  Location: [facility] CATH LAB;  Service: Cardiovascular;  Laterality: N/A;  . Right heart catheterization N/A 12/03/2013    Procedure: RIGHT HEART CATH;  Surgeon: Jolaine Artist, MD;  Location: [facility] CATH LAB;  Service: Cardiovascular;  Laterality: N/A;  . Cardiac catheterization    . Tonsillectomy    . Cystoscopy w/ litholapaxy / ehl    . Cataract extraction w/ intraocular lens  implant, bilateral Bilateral ~ 1991  . Coronary angioplasty    . Cardiac defibrillator placement  10/30/2014  . Ep implantable device N/A 10/30/2014    Procedure: ICD Implant;  Surgeon: Deboraha Sprang, MD, SJM Ellipse DR implanted for primary prevention     Current Outpatient Prescriptions  Medication Sig Dispense Refill  . ALPRAZolam (XANAX) 0.25 MG tablet Take 0.5 tablets by mouth daily.  0  . aspirin 81 MG tablet Take 81 mg by mouth daily.    [name] atorvastatin (LIPITOR) 80 MG tablet Take 1 tablet (80 mg total) by mouth daily at 6 PM. 90 tablet 1  .  carvedilol (COREG) 6.25 MG tablet Take 1 tablet (6.25 mg total) by mouth 2 (two) times daily with a meal. 180 tablet 1  . Cyanocobalamin (VITAMIN B-12) 1000 MCG SUBL Place 1,000 mcg under the tongue 3 (three) times a week.    . digoxin (LANOXIN) 0.125 MG tablet Take 1 tablet (0.125 mg total) by mouth daily. 30 tablet 11  . furosemide (LASIX) 40 MG tablet Take 40 mg by mouth as needed.    [name] glipiZIDE (GLUCOTROL XL) 5 MG 24 hr tablet Take 1 tablet by mouth 2 (two) times daily.  1  . hydrALAZINE (APRESOLINE) 50 MG tablet Take 1.5 tablets (75 mg total) by mouth 3 (three) times daily. 405 tablet  1  . isosorbide mononitrate (IMDUR) 60 MG 24 hr tablet Take 1 tablet (60 mg total) by mouth daily. 90 tablet 1  . levothyroxine (SYNTHROID, LEVOTHROID) 50 MCG tablet Take 50 mcg by mouth every morning.     [name] losartan (COZAAR) 50 MG tablet Take 1 tablet (50 mg total) by mouth daily. 90 tablet 1  . metFORMIN (GLUCOPHAGE) 500 MG tablet Take 500 mg by mouth 2 (two) times daily with a meal.    . Omega-3 Fatty Acids (FISH OIL) 1000 MG CAPS Take 1 capsule by mouth 2 (two) times daily.    . ONE TOUCH ULTRA TEST test strip 1 each by Other route 2 (two) times daily.   0  . potassium chloride SA (K-DUR,KLOR-CON) 20 MEQ tablet Take 1 tablet (20 mEq total) by mouth as needed. TAKE ONLY IF TALKING LASIX 90 tablet 1  . spironolactone (ALDACTONE) 25 MG tablet Take 0.5 tablets (12.5 mg total) by mouth daily. 45 tablet 1  . traZODone (DESYREL) 50 MG tablet Take 50 mg by mouth at bedtime as needed for sleep. For sleep  0   No current facility-administered medications for this visit.    Allergies:   Tetracycline   Social History:  The patient  reports that he has never smoked. He has never used smokeless tobacco. He reports that he does not drink alcohol or use illicit drugs.   Family History:  The patient's family history includes Dementia in his mother; Hypertension in his father.    ROS:  Please see the history of present illness.   All other systems are reviewed and negative.    PHYSICAL EXAM: VS:  BP 128/60 mmHg  Pulse 64  Ht 5\' 8"  (1.727 m)  Wt 157 lb (71.215 kg)  BMI 23.88 kg/m2  SpO2 99% , BMI Body mass index is 23.88 kg/(m^2). GEN: Well nourished, well developed, in no acute distress HEENT: normal Neck: no JVD, carotid bruits, or masses Cardiac: RRR; no murmurs, rubs, or gallops,no edema  Respiratory:  clear to auscultation bilaterally, normal work of breathing GI: soft, nontender, nondistended, + BS MS: no deformity or atrophy Skin: warm and dry, device pocket is well healed Neuro:   Strength and sensation are intact Psych: euthymic mood, full affect  Device interrogation is reviewed today in detail.  See PaceArt for details.   Recent Labs: 09/13/2014: BNP 834.2* 10/21/2014: Hemoglobin 12.8*; Platelets 200.0 10/31/2014: BUN 26*; Creatinine, Ser 1.67*; Potassium 3.6; Sodium 141    Lipid Panel     Component Value Date/Time   CHOL 237* 05/27/2014 1230   TRIG 181* 05/27/2014 1230   HDL 37* 05/27/2014 1230   CHOLHDL 6.4 05/27/2014 1230   VLDL 36 05/27/2014 1230   LDLCALC 164* 05/27/2014 1230     Wt Readings from Last  3 Encounters:  02/21/15 157 lb (71.215 kg)  12/12/14 157 lb (71.215 kg)  10/31/14 154 lb 11.2 oz (70.171 kg)      Other studies Reviewed: Additional studies/ records that were reviewed today include: Dr Aquilla Hacker office notes    ASSESSMENT AND PLAN:  1.  Ischemic CM/ Chronic systolic dysfunction Doing well s/p ICD see paceart He had negative hysteresis on at 50 msec and was V pacing 96%.  I have turned this off to avoid V pacing and then reassess symptoms for fatigue/ decreased exercise tolerance.  If his symptoms return, we should turn this back on.  2. HTN Stable No change required today   Follow-up: merlin Return to see me in 1 year  Current medicines are reviewed at length with the patient today.   The patient does not have concerns regarding his medicines.  The following changes were made today:  none   Signed, Thompson Grayer, MD  02/21/2015 9:37 AM     [facility] [address] [place] [address] [phone] (office) [phone] (fax)

## 2015-02-25 ENCOUNTER — Telehealth: Payer: Self-pay | Admitting: Cardiology

## 2015-02-25 LAB — CUP PACEART INCLINIC DEVICE CHECK
Battery Remaining Longevity: 72 mo
Brady Statistic RA Percent Paced: 1.5 %
Brady Statistic RV Percent Paced: 96 %
Date Time Interrogation Session: 20161202134924
HighPow Impedance: 57.375
Implantable Lead Implant Date: 20160810
Implantable Lead Implant Date: 20160810
Implantable Lead Location: 753859
Implantable Lead Location: 753860
Lead Channel Impedance Value: 425 Ohm
Lead Channel Impedance Value: 437.5 Ohm
Lead Channel Pacing Threshold Amplitude: 0.625 V
Lead Channel Pacing Threshold Amplitude: 1.125 V
Lead Channel Pacing Threshold Pulse Width: 0.5 ms
Lead Channel Pacing Threshold Pulse Width: 0.5 ms
Lead Channel Sensing Intrinsic Amplitude: 11.7 mV
Lead Channel Sensing Intrinsic Amplitude: 2.1 mV
Lead Channel Setting Pacing Amplitude: 2.125
Lead Channel Setting Pacing Amplitude: 2.5 V
Lead Channel Setting Pacing Pulse Width: 0.5 ms
Lead Channel Setting Sensing Sensitivity: 0.5 mV
Pulse Gen Serial Number: 7179751

## 2015-02-25 MED ORDER — LOSARTAN POTASSIUM 50 MG PO TABS: 50.0000 mg | ORAL_TABLET | Freq: Every day | ORAL | Status: DC

## 2015-02-25 NOTE — Telephone Encounter (Signed)
Needs refill    losartan (COZAAR) 50 MG tablet [facility] in [place]

## 2015-02-25 NOTE — Telephone Encounter (Signed)
Done

## 2015-02-26 ENCOUNTER — Telehealth: Payer: Self-pay | Admitting: *Deleted

## 2015-02-26 NOTE — Telephone Encounter (Signed)
Patient is requesting his Losartan 50mg  be filled.  Stated that he is currently taking 1 1/2 tabs daily.  In September of last year he was on that dose, but since has been up & down.  Patient stated that someone called him & told him to go back up to the 1 1/2 tabs, but I can not find documentation of this.  Please advise if okay to fill as he has been taking or change back to the 50mg  - one tab daily.

## 2015-02-27 MED ORDER — LOSARTAN POTASSIUM 50 MG PO TABS: ORAL_TABLET | ORAL | Status: DC

## 2015-02-27 NOTE — Telephone Encounter (Signed)
Patient notified.  Will send refill to [facility] now.

## 2015-02-27 NOTE — Telephone Encounter (Signed)
If he is actually been taking losartan 75 mg daily, his chart shows good control of blood pressure recently, so I would go ahead and continue that dose.

## 2015-03-26 NOTE — Telephone Encounter (Signed)
SPOKE TO MRS Fano ABOUT APPT LOCATION CHANGE FOR WOUND CHECK IN [place] 11/08/14 PER HER REQUEST AND 53 DAY FOLLOWUP WITH DR Caryl Comes 02/05/15

## 2015-04-08 DIAGNOSIS — R8299 Other abnormal findings in urine: Secondary | ICD-10-CM | POA: Diagnosis not present

## 2015-04-08 DIAGNOSIS — N201 Calculus of ureter: Secondary | ICD-10-CM | POA: Diagnosis not present

## 2015-04-08 DIAGNOSIS — N200 Calculus of kidney: Secondary | ICD-10-CM | POA: Diagnosis not present

## 2015-05-26 ENCOUNTER — Ambulatory Visit (INDEPENDENT_AMBULATORY_CARE_PROVIDER_SITE_OTHER): Payer: Medicare Other | Admitting: *Deleted

## 2015-05-26 DIAGNOSIS — I255 Ischemic cardiomyopathy: Secondary | ICD-10-CM | POA: Diagnosis not present

## 2015-05-30 NOTE — Progress Notes (Signed)
Remote ICD transmission.   

## 2015-05-31 LAB — CUP PACEART REMOTE DEVICE CHECK
Date Time Interrogation Session: 20170311110616
Implantable Lead Implant Date: 20160810
Implantable Lead Implant Date: 20160810
Implantable Lead Location: 753859
Implantable Lead Location: 753860
Lead Channel Setting Pacing Amplitude: 2.125
Lead Channel Setting Pacing Amplitude: 2.5 V
Lead Channel Setting Pacing Pulse Width: 0.5 ms
Lead Channel Setting Sensing Sensitivity: 0.5 mV
Pulse Gen Serial Number: 7179751

## 2015-05-31 NOTE — Progress Notes (Signed)
Normal remote reviewed.  Next Merlin 08/25/15

## 2015-06-04 ENCOUNTER — Encounter: Payer: Self-pay | Admitting: Cardiology

## 2015-06-10 DIAGNOSIS — I2510 Atherosclerotic heart disease of native coronary artery without angina pectoris: Secondary | ICD-10-CM | POA: Diagnosis not present

## 2015-06-10 DIAGNOSIS — I10 Essential (primary) hypertension: Secondary | ICD-10-CM | POA: Diagnosis not present

## 2015-06-10 DIAGNOSIS — E7800 Pure hypercholesterolemia, unspecified: Secondary | ICD-10-CM | POA: Diagnosis not present

## 2015-06-10 DIAGNOSIS — E119 Type 2 diabetes mellitus without complications: Secondary | ICD-10-CM | POA: Diagnosis not present

## 2015-06-17 ENCOUNTER — Ambulatory Visit (INDEPENDENT_AMBULATORY_CARE_PROVIDER_SITE_OTHER): Payer: Medicare Other | Admitting: Cardiology

## 2015-06-17 ENCOUNTER — Encounter: Payer: Self-pay | Admitting: Cardiology

## 2015-06-17 VITALS — BP 157/72 | HR 69 | Ht 68.0 in | Wt 158.4 lb

## 2015-06-17 DIAGNOSIS — E1122 Type 2 diabetes mellitus with diabetic chronic kidney disease: Secondary | ICD-10-CM | POA: Diagnosis not present

## 2015-06-17 DIAGNOSIS — I5042 Chronic combined systolic (congestive) and diastolic (congestive) heart failure: Secondary | ICD-10-CM

## 2015-06-17 DIAGNOSIS — I255 Ischemic cardiomyopathy: Secondary | ICD-10-CM

## 2015-06-17 DIAGNOSIS — N183 Chronic kidney disease, stage 3 unspecified: Secondary | ICD-10-CM

## 2015-06-17 DIAGNOSIS — I2510 Atherosclerotic heart disease of native coronary artery without angina pectoris: Secondary | ICD-10-CM | POA: Diagnosis not present

## 2015-06-17 DIAGNOSIS — E1165 Type 2 diabetes mellitus with hyperglycemia: Secondary | ICD-10-CM | POA: Diagnosis not present

## 2015-06-17 DIAGNOSIS — I509 Heart failure, unspecified: Secondary | ICD-10-CM | POA: Diagnosis not present

## 2015-06-17 DIAGNOSIS — F419 Anxiety disorder, unspecified: Secondary | ICD-10-CM | POA: Diagnosis not present

## 2015-06-17 DIAGNOSIS — I10 Essential (primary) hypertension: Secondary | ICD-10-CM

## 2015-06-17 NOTE — Progress Notes (Signed)
Cardiology Office Note  Date: 06/17/2015   ID: Clayton Morton, DOB Mar 09, 1933, MRN [id number]  PCP: Glenda Chroman., MD  Primary Cardiologist: Rozann Lesches, MD   Chief Complaint  Patient presents with  . Cardiomyopathy  . Coronary Artery Disease    History of Present Illness: Clayton Morton is an 82 y.o. male last seen in September 2016. He presents for a routine follow-up visit. Continues to feel well, reports NYHA class II dyspnea with his usual activities, no chest pain or palpitations. He walks outdoors when the weather is good, just bought a treadmill as well. Has not played any golf recently. His weight has been stable.  I reviewed his medications. Cardiac regimen includes aspirin, Coreg, Lanoxin, Lipitor, hydralazine, Imdur, Cozaar, Aldactone and potassium supplements.   He follows with [name] in the device clinic, was seen in December 2016. Most recent remote device interrogation showed normal function.  He continues to follow with Dr. Woody Seller as well, has a physical with lab work later in the spring.  We discussed his blood pressure, he says that systolic is usually under 150 at home when he checks it.  Past Medical History  Diagnosis Date  . Essential hypertension, benign   . Cerebrovascular accident [facility])     Right subcortical infarct 2006  . Macular degeneration     Dr. [name]  . Coronary atherosclerosis of native coronary artery     1) CABG 2005 2) LHC (910/15): patent SVG to PDA, occlusion of SVG sequence to PLA, patency of LIMA to LAD and patency of SVG to diagonal with sev stenosis at coronary anastomotic site  . Hyperlipidemia   . Varicose veins   . Non-ST elevation MI (NSTEMI) ([place])     2005  . Ischemic cardiomyopathy   . Chronic systolic congestive heart failure ([place])     a) ECHO (02/2012): EF 30-35%, no obvious thrombus b) ECHO (11/2013): EF 10%, AV mildly calcified, mild MR, RV mod dialted and sys fx sev reduced, TAPSE 0.8, lateral annulus peak S  velocity 5.4 c) RHC (11/29/13): RA 15, RV: 65/19, PA 49%, CO/CI: 3.25 / 1.7 d) RHC (12/03/13): RA 1, RV 25/1/2, PA 27/10 (16), PCWP 6, Fick CO/CI: 5.3 / 2.9, PVR 1.9 WU, PA sat 72 and 74%   . Kidney stones   . AICD (automatic cardioverter/defibrillator) present 10/30/2014  . Diabetes mellitus, type 2 ([place])   . History of gout   . Age-related macular degeneration, wet, right eye [facility])     Past Surgical History  Procedure Laterality Date  . Coronary artery bypass graft  2005    LIMA to LAD, SVG diagonal, SVG to PDA and PLA  . Left and right heart catheterization with coronary/graft angiogram N/A 11/29/2013    Procedure: LEFT AND RIGHT HEART CATHETERIZATION WITH Beatrix Fetters;  Surgeon: Blane Ohara, MD;  Location: [facility] CATH LAB;  Service: Cardiovascular;  Laterality: N/A;  . Right heart catheterization N/A 12/03/2013    Procedure: RIGHT HEART CATH;  Surgeon: Jolaine Artist, MD;  Location: [facility] CATH LAB;  Service: Cardiovascular;  Laterality: N/A;  . Cardiac catheterization    . Tonsillectomy    . Cystoscopy w/ litholapaxy / ehl    . Cataract extraction w/ intraocular lens  implant, bilateral Bilateral ~ 1991  . Coronary angioplasty    . Cardiac defibrillator placement  10/30/2014  . Ep implantable device N/A 10/30/2014    Procedure: ICD Implant;  Surgeon: Deboraha Sprang, MD, SJM Ellipse DR implanted  for primary prevention    Current Outpatient Prescriptions  Medication Sig Dispense Refill  . ALPRAZolam (XANAX) 0.25 MG tablet Take 0.5 tablets by mouth daily.  0  . aspirin 81 MG tablet Take 81 mg by mouth daily.    [name] atorvastatin (LIPITOR) 80 MG tablet Take 1 tablet (80 mg total) by mouth daily at 6 PM. 90 tablet 1  . carvedilol (COREG) 6.25 MG tablet Take 1 tablet (6.25 mg total) by mouth 2 (two) times daily with a meal. 180 tablet 1  . Cyanocobalamin (VITAMIN B-12) 1000 MCG SUBL Place 1,000 mcg under the tongue 3 (three) times a week.    . digoxin (LANOXIN) 0.125 MG tablet Take  1 tablet (0.125 mg total) by mouth daily. 30 tablet 11  . furosemide (LASIX) 40 MG tablet Take 40 mg by mouth as needed.    [name] glipiZIDE (GLUCOTROL XL) 5 MG 24 hr tablet Take 1 tablet by mouth 2 (two) times daily.  1  . hydrALAZINE (APRESOLINE) 50 MG tablet Take 1.5 tablets (75 mg total) by mouth 3 (three) times daily. 405 tablet 1  . isosorbide mononitrate (IMDUR) 60 MG 24 hr tablet Take 1 tablet (60 mg total) by mouth daily. 90 tablet 1  . levothyroxine (SYNTHROID, LEVOTHROID) 50 MCG tablet Take 50 mcg by mouth every morning.     [name] losartan (COZAAR) 50 MG tablet Take 1 tab by mouth every morning & 1/2 tab every evening (75mg  total for the day) 45 tablet 6  . metFORMIN (GLUCOPHAGE) 500 MG tablet Take 500 mg by mouth 2 (two) times daily with a meal.    . Omega-3 Fatty Acids (FISH OIL) 1000 MG CAPS Take 1 capsule by mouth 2 (two) times daily.    . ONE TOUCH ULTRA TEST test strip 1 each by Other route 2 (two) times daily.   0  . potassium chloride SA (K-DUR,KLOR-CON) 20 MEQ tablet Take 1 tablet (20 mEq total) by mouth as needed. TAKE ONLY IF TALKING LASIX 90 tablet 1  . spironolactone (ALDACTONE) 25 MG tablet Take 0.5 tablets (12.5 mg total) by mouth daily. 45 tablet 1  . traZODone (DESYREL) 50 MG tablet Take 50 mg by mouth at bedtime as needed for sleep. For sleep  0   No current facility-administered medications for this visit.   Allergies:  Tetracycline   Social History: The patient  reports that he has never smoked. He has never used smokeless tobacco. He reports that he does not drink alcohol or use illicit drugs.   ROS:  Please see the history of present illness. Otherwise, complete review of systems is positive for decreased hearing, arthritic symptoms.  All other systems are reviewed and negative.   Physical Exam: VS:  BP 157/72 mmHg  Pulse 69  Ht 5\' 8"  (1.727 m)  Wt 158 lb 6.4 oz (71.85 kg)  BMI 24.09 kg/m2  SpO2 96%, BMI Body mass index is 24.09 kg/(m^2).  Wt Readings from Last 3  Encounters:  06/17/15 158 lb 6.4 oz (71.85 kg)  02/21/15 157 lb (71.215 kg)  12/12/14 157 lb (71.215 kg)    General: Patient appears comfortable at rest. HEENT: Conjunctiva and lids normal, oropharynx clear. Neck: Supple, no elevated JVP or carotid bruits, no thyromegaly. Lungs: Clear to auscultation, nonlabored breathing at rest. Cardiac: Regular rate and rhythm, no S3. Abdomen: Soft, nontender, bowel sounds present. Extremities: No pitting edema, distal pulses 2+. Skin: Warm and dry. Musculoskeletal: No kyphosis. Neuropsychiatric: Alert and oriented x3, affect grossly appropriate.  ECG: I personally reviewed the prior tracing from 11/08/2014 which showed a ventricular paced rhythm.  Recent Labwork: 09/13/2014: BNP 834.2* 10/21/2014: Hemoglobin 12.8*; Platelets 200.0 10/31/2014: BUN 26*; Creatinine, Ser 1.67*; Potassium 3.6; Sodium 141     Component Value Date/Time   CHOL 237* 05/27/2014 1230   TRIG 181* 05/27/2014 1230   HDL 37* 05/27/2014 1230   CHOLHDL 6.4 05/27/2014 1230   VLDL 36 05/27/2014 1230   LDLCALC 164* 05/27/2014 1230    Other Studies Reviewed Today:  Echocardiogram 08/28/2014: Study Conclusions  - Left ventricle: The cavity size was mildly to moderately dilated. There was moderate concentric hypertrophy. Systolic function was severely reduced. The estimated ejection fraction was in the range of 25% to 30%. Doppler parameters are consistent with abnormal left ventricular relaxation (grade 1 diastolic dysfunction). - Regional wall motion abnormality: Akinesis of the mid inferior myocardium; severe hypokinesis of the mid inferoseptal, basal inferior, and basal-mid inferolateral myocardium; moderate hypokinesis of the mid anteroseptal and apical inferior myocardium. The remaining walls are moderately hypokinetic. - Ventricular septum: Septal motion showed abnormal function and dyssynergy. These changes are consistent with  intraventricular conduction delay. - Aortic valve: Mildly to moderately calcified annulus. Trileaflet; moderately thickened, mildly calcified leaflets. There was no stenosis. There was trivial regurgitation. - Mitral valve: There was mild to moderate regurgitation. - Left atrium: The atrium was severely dilated. Volume/bsa ES (A/L): 44.2 ml/m^2. - Right ventricle: Systolic function was moderately reduced. - Pulmonic valve: There was mild regurgitation.  Assessment and Plan:  1. Symptomatically stable combined heart failure, no change in weight, no orthopnea or PND. Medical regimen is reviewed above. Blood pressure is elevated today, but will hold off on increasing Cozaar further since he is also on Aldactone and Lasix with CKD stage III. He has done well over the last several months. I encouraged him to continue his regular exercise plan.  2. CAD status post CABG, graft disease as outlined above. He does not report any active angina symptoms at this time.  3. CKD, stage 3. Creatinine 1.6.  4. Ischemic cardiomyopathy with LVEF 25-30% by last assessment. We will arrange follow-up echocardiogram around the time of his next visit.  5. Essential hypertension, blood pressure is elevated today, but it sounds like somewhat better controlled at home. Holding off on further advancing Cozaar for now.  Current medicines were reviewed with the patient today.  Disposition: FU with me in 4 months.   Signed, Satira Sark, MD, [facility] 06/17/2015 11:31 AM    [facility] [place], [place], [place] [address] Phone: [phone]; Fax: [phone]

## 2015-06-17 NOTE — Patient Instructions (Signed)
Your physician recommends that you continue on your current medications as directed. Please refer to the Current Medication list given to you today. Your physician recommends that you schedule a follow-up appointment in: 4 months with [name]. You will receive a reminder letter in the mail in about 2 months reminding you to call and schedule your appointment. If you don't receive this letter, please contact our office. Please schedule a follow up appointment in the Heart Failure Clinic.

## 2015-06-19 DIAGNOSIS — H353122 Nonexudative age-related macular degeneration, left eye, intermediate dry stage: Secondary | ICD-10-CM | POA: Diagnosis not present

## 2015-06-19 DIAGNOSIS — H353213 Exudative age-related macular degeneration, right eye, with inactive scar: Secondary | ICD-10-CM | POA: Diagnosis not present

## 2015-06-19 DIAGNOSIS — H353114 Nonexudative age-related macular degeneration, right eye, advanced atrophic with subfoveal involvement: Secondary | ICD-10-CM | POA: Diagnosis not present

## 2015-07-07 DIAGNOSIS — E119 Type 2 diabetes mellitus without complications: Secondary | ICD-10-CM | POA: Diagnosis not present

## 2015-07-07 DIAGNOSIS — I10 Essential (primary) hypertension: Secondary | ICD-10-CM | POA: Diagnosis not present

## 2015-07-07 DIAGNOSIS — E7800 Pure hypercholesterolemia, unspecified: Secondary | ICD-10-CM | POA: Diagnosis not present

## 2015-07-07 DIAGNOSIS — I2510 Atherosclerotic heart disease of native coronary artery without angina pectoris: Secondary | ICD-10-CM | POA: Diagnosis not present

## 2015-07-21 ENCOUNTER — Other Ambulatory Visit: Payer: Self-pay | Admitting: Internal Medicine

## 2015-07-25 DIAGNOSIS — I2510 Atherosclerotic heart disease of native coronary artery without angina pectoris: Secondary | ICD-10-CM | POA: Diagnosis not present

## 2015-07-25 DIAGNOSIS — E7800 Pure hypercholesterolemia, unspecified: Secondary | ICD-10-CM | POA: Diagnosis not present

## 2015-07-25 DIAGNOSIS — E119 Type 2 diabetes mellitus without complications: Secondary | ICD-10-CM | POA: Diagnosis not present

## 2015-07-25 DIAGNOSIS — I10 Essential (primary) hypertension: Secondary | ICD-10-CM | POA: Diagnosis not present

## 2015-07-29 DIAGNOSIS — Z7189 Other specified counseling: Secondary | ICD-10-CM | POA: Diagnosis not present

## 2015-07-29 DIAGNOSIS — Z6825 Body mass index (BMI) 25.0-25.9, adult: Secondary | ICD-10-CM | POA: Diagnosis not present

## 2015-07-29 DIAGNOSIS — Z0000 Encounter for general adult medical examination without abnormal findings: Secondary | ICD-10-CM | POA: Diagnosis not present

## 2015-07-29 DIAGNOSIS — Z1389 Encounter for screening for other disorder: Secondary | ICD-10-CM | POA: Diagnosis not present

## 2015-07-29 DIAGNOSIS — Z299 Encounter for prophylactic measures, unspecified: Secondary | ICD-10-CM | POA: Diagnosis not present

## 2015-07-29 DIAGNOSIS — Z1211 Encounter for screening for malignant neoplasm of colon: Secondary | ICD-10-CM | POA: Diagnosis not present

## 2015-07-30 DIAGNOSIS — R5383 Other fatigue: Secondary | ICD-10-CM | POA: Diagnosis not present

## 2015-07-30 DIAGNOSIS — E7800 Pure hypercholesterolemia, unspecified: Secondary | ICD-10-CM | POA: Diagnosis not present

## 2015-07-30 DIAGNOSIS — Z79899 Other long term (current) drug therapy: Secondary | ICD-10-CM | POA: Diagnosis not present

## 2015-07-30 DIAGNOSIS — Z125 Encounter for screening for malignant neoplasm of prostate: Secondary | ICD-10-CM | POA: Diagnosis not present

## 2015-08-05 ENCOUNTER — Ambulatory Visit (HOSPITAL_COMMUNITY)
Admission: RE | Admit: 2015-08-05 | Discharge: 2015-08-05 | Disposition: A | Discharge status: Home / Self Care | Payer: Medicare Other | Source: Ambulatory Visit | Attending: Cardiology | Admitting: Cardiology

## 2015-08-05 ENCOUNTER — Encounter (HOSPITAL_COMMUNITY): Payer: Self-pay

## 2015-08-05 VITALS — BP 130/68 | HR 67 | Wt 151.8 lb

## 2015-08-05 DIAGNOSIS — I2510 Atherosclerotic heart disease of native coronary artery without angina pectoris: Secondary | ICD-10-CM

## 2015-08-05 DIAGNOSIS — I255 Ischemic cardiomyopathy: Secondary | ICD-10-CM

## 2015-08-05 DIAGNOSIS — I5022 Chronic systolic (congestive) heart failure: Secondary | ICD-10-CM

## 2015-08-05 LAB — BASIC METABOLIC PANEL
Anion gap: 6 (ref 5–15)
BUN: 43 mg/dL — ABNORMAL HIGH (ref 6–20)
CO2: 25 mmol/L (ref 22–32)
Calcium: 8.9 mg/dL (ref 8.9–10.3)
Chloride: 108 mmol/L (ref 101–111)
Creatinine, Ser: 1.99 mg/dL — ABNORMAL HIGH (ref 0.61–1.24)
GFR calc Af Amer: 35 mL/min — ABNORMAL LOW (ref >60)
GFR calc non Af Amer: 30 mL/min — ABNORMAL LOW (ref >60)
Glucose, Bld: 214 mg/dL — ABNORMAL HIGH (ref 65–99)
Potassium: 4.2 mmol/L (ref 3.5–5.1)
Sodium: 139 mmol/L (ref 135–145)

## 2015-08-05 LAB — LIPID PANEL
Cholesterol: 111 mg/dL (ref 0–200)
HDL: 31 mg/dL — ABNORMAL LOW (ref >40)
LDL Cholesterol: 49 mg/dL (ref 0–99)
Total CHOL/HDL Ratio: 3.6 RATIO
Triglycerides: 156 mg/dL — ABNORMAL HIGH (ref <150)
VLDL: 31 mg/dL (ref 0–40)

## 2015-08-05 LAB — DIGOXIN LEVEL: Digoxin Level: 0.6 ng/mL — ABNORMAL LOW (ref 0.8–2.0)

## 2015-08-05 MED ORDER — CARVEDILOL 12.5 MG PO TABS: 12.5000 mg | ORAL_TABLET | Freq: Two times a day (BID) | ORAL | Status: DC

## 2015-08-05 NOTE — Patient Instructions (Signed)
Routine lab work today. Will notify you of abnormal results, otherwise no news is good news!  INCREASE Carvedilol (Coreg) as follows... 9.375 mg (1.5 tabs of your current Rx) twice daily tomorrow, Thursday, Friday, and Saturday. 12.5 mg (2 of your current tabs or 1 tab of new Rx) twice daily from Sunday forward.  Will schedule you for an echocardiogram at [facility] office in [place]. Address: [address], [place], [place] [address]  Phone: [phone]  Follow up with Dr. Aundra Dubin in 6 months.  Do the following things EVERYDAY: 1) Weigh yourself in the morning before breakfast. Write it down and keep it in a log. 2) Take your medicines as prescribed 3) Eat low salt foods-Limit salt (sodium) to 2000 mg per day.  4) Stay as active as you can everyday 5) Limit all fluids for the day to less than 2 liters

## 2015-08-06 ENCOUNTER — Other Ambulatory Visit: Payer: Self-pay | Admitting: *Deleted

## 2015-08-06 MED ORDER — LOSARTAN POTASSIUM 50 MG PO TABS: ORAL_TABLET | ORAL | Status: DC

## 2015-08-07 NOTE — Progress Notes (Signed)
Patient ID: Clayton Morton, male   DOB: [birth year], 82 y.o.   MRN: [id number] PCP: Dr. Woody Seller Primary Cardiologist: [name]  HPI: Clayton Morton is an 82 yo with a history of ICM, chronic systolic HF, HTN, CVA 123456, DM2, HLD and CAD s/p CABG 2005.  Scheduled for L/RHC on 11/29/13 and was admitted with low output and severe CAD. Repeat RHC showed low filling pressures and normal CO.   Echo (11/2013): EF 10%, RV mod dilated and systolic fx severely reduced Echo (02/26/14): EF 30-35% inferior AK mild RV dysfunction Echo (6/16): EF 25-30%, mild to moderate LV dilation, regional WMAs, mild to moderate Clayton, moderately decreased RV systolic function, severe LAE.   Losartan switched to [place] but unable to tolerate due to severe hypotension. Switched back to losartan. He saw Dr Caryl Comes for consideration of ICD.  This was decided against for the time being given evidence for some LV functional recovery (EF 10% => 30%).  Dr Caryl Comes started him on Coreg. There was also concern for frequent PVCs and possible contribution of PVCs to his cardiomyopathy.  Holter monitor was done in 6/16 and showed only about 1% PVCs.    Today, Clayton Morton is doing well.  No chest pain, no exertional dyspnea.  No orthopnea or PND.  He is taking Lasix once or twce a week based on weight.  He walks 25 minutes/day on his treadmill. He played 18 holes of golf this week.   Corevue reviewed: Stable impedance, not suggestive of volume overload.    Labs:  12/10/13: K+ 4.5, BUN 31, creatinine 1.46 12/18/13 (PCP office): K+ 4.0, creatinine 1.74 12/24/13 K+ 4.1, creatinine 1.51, pro-BNP 5332 02/05/14 K+ 3.9 creatinine 1.83 12/15 K+ 3.9 creatinine 1.71 => 1.86, digoxin 0.5 5/16: K 4.3, creatinine 1.61, LDL 57, HDL 37 8/16: K 3.6, creatinine 1.67  ROS: All systems negative except as listed in HPI, PMH and Problem List.  SH:  Social History   Social History  . Marital Status: Married    Spouse Name: N/A  . Number of Children: N/A  . Years of  Education: N/A   Occupational History  . Not on file.   Social History Main Topics  . Smoking status: Never Smoker   . Smokeless tobacco: Never Used  . Alcohol Use: No  . Drug Use: No  . Sexual Activity: Not on file   Other Topics Concern  . 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 Lives in [place] with wife.     FH:  Family History  Problem Relation Age of Onset  . Hypertension Father     CAD, CHF; deceased at 35  . Dementia Mother     deceased at age 12    Past Medical History  Diagnosis Date  . Essential hypertension, benign   . Cerebrovascular accident [facility])     Right subcortical infarct 2006  . Macular degeneration     Dr. [name]  . Coronary atherosclerosis of native coronary artery     1) CABG 2005 2) LHC (910/15): patent SVG to PDA, occlusion of SVG sequence to PLA, patency of LIMA to LAD and patency of SVG to diagonal with sev stenosis at coronary anastomotic site  . Hyperlipidemia   . Varicose veins   . Non-ST elevation MI (NSTEMI) ([place])     2005  . Ischemic cardiomyopathy   . Chronic systolic congestive heart failure ([place])     a) ECHO (02/2012):  EF 30-35%, no obvious thrombus b) ECHO (11/2013): EF 10%, AV mildly calcified, mild Clayton, RV mod dialted and sys fx sev reduced, TAPSE 0.8, lateral annulus peak S velocity 5.4 c) RHC (11/29/13): RA 15, RV: 65/19, PA 49%, CO/CI: 3.25 / 1.7 d) RHC (12/03/13): RA 1, RV 25/1/2, PA 27/10 (16), PCWP 6, Fick CO/CI: 5.3 / 2.9, PVR 1.9 WU, PA sat 72 and 74%   . Kidney stones   . AICD (automatic cardioverter/defibrillator) present 10/30/2014  . Diabetes mellitus, type 2 ([place])   . History of gout   . Age-related macular degeneration, wet, right eye ([place])     Current Outpatient Prescriptions  Medication Sig Dispense Refill  . ALPRAZolam (XANAX) 0.25 MG tablet Take 0.5 tablets by mouth daily.  0  . aspirin 81 MG tablet Take 81 mg by mouth daily.    [name] atorvastatin (LIPITOR) 80 MG tablet Take 1  tablet (80 mg total) by mouth daily at 6 PM. 90 tablet 1  . carvedilol (COREG) 12.5 MG tablet Take 1 tablet (12.5 mg total) by mouth 2 (two) times daily with a meal. 60 tablet 6  . digoxin (LANOXIN) 0.125 MG tablet Take 1 tablet (0.125 mg total) by mouth daily. 30 tablet 11  . furosemide (LASIX) 40 MG tablet Take 40 mg by mouth as needed.    [name] glipiZIDE (GLUCOTROL XL) 5 MG 24 hr tablet Take 1 tablet by mouth 2 (two) times daily.  1  . hydrALAZINE (APRESOLINE) 50 MG tablet Take 1.5 tablets (75 mg total) by mouth 3 (three) times daily. 405 tablet 1  . isosorbide mononitrate (IMDUR) 60 MG 24 hr tablet Take 1 tablet (60 mg total) by mouth daily. 90 tablet 1  . levothyroxine (SYNTHROID, LEVOTHROID) 50 MCG tablet Take 50 mcg by mouth every morning.     . metFORMIN (GLUCOPHAGE) 500 MG tablet Take 500 mg by mouth 2 (two) times daily with a meal.    . Omega-3 Fatty Acids (FISH OIL) 1000 MG CAPS Take 1 capsule by mouth 2 (two) times daily.    . ONE TOUCH ULTRA TEST test strip 1 each by Other route 2 (two) times daily.   0  . potassium chloride SA (K-DUR,KLOR-CON) 20 MEQ tablet Take 1 tablet (20 mEq total) by mouth as needed. TAKE ONLY IF TALKING LASIX 90 tablet 1  . spironolactone (ALDACTONE) 25 MG tablet Take 0.5 tablets (12.5 mg total) by mouth daily. 45 tablet 1  . traZODone (DESYREL) 50 MG tablet Take 50 mg by mouth at bedtime as needed for sleep. For sleep  0  . losartan (COZAAR) 50 MG tablet Take 1 tab by mouth every morning & 1/2 tab every evening (75mg  total for the day) 135 tablet 3   No current facility-administered medications for this encounter.    Filed Vitals:   08/05/15 1406  BP: 130/68  Pulse: 67  Weight: 151 lb 12 oz (68.833 kg)  SpO2: 99%    PHYSICAL EXAM: General:  Elderly well appearing. No resp difficulty, wife present  HEENT: normal Neck: supple. JVP flat. Carotids 2+ bilaterally; no bruits. No lymphadenopathy or thryomegaly appreciated. Cor: PMI normal. SB & regular  rhythm. No rubs, gallops or murmurs. Lungs: clear Abdomen: soft, nontender, nondistended. No hepatosplenomegaly. No bruits or masses. Good bowel sounds. Extremities: no cyanosis, clubbing, rash, edema Neuro: alert & orientedx3, cranial nerves grossly intact. Moves all 4 extremities w/o difficulty. Affect pleasant.  ASSESSMENT & PLAN:  1) Chronic systolic HF: Ischemic cardiomyopathy.  Most recent  echo in 6/16 showed persistently low EF, 25-30%.  NYHA II symptoms and volume status stable. [employer] ICD.  - Will continue lasix 40 mg PRN. If weight greater than 153 lbs take 40 mg, if weight less than 153 lbs hold lasix. Would like weight about 151-153 lbs. - Increase Coreg to 9.375 mg bid x 4 days then up to 12.5 mg bid after that.  - Continue losartan 50 mg qam, 25 mg qpm; continue spironolactone 12.5 daily.  BMET today.  - Continue digoxin 0.125 mg daily, check level.   - Not enough PVCs by holter monitoring to play role in cardiomyopathy.  - He will get an echo in 6/17 at [facility] office.   2) CAD s/p CABG:  Last LHC (11/2013) severe native CAD, occlusion of SVG sequence to PLA. Continue statin and ASA 81 daily. Check lipids today. 3) HTN:  BP not elevated.  4) CKD, stage III:  BMET today.    Loralie Champagne  08/07/2015

## 2015-08-14 ENCOUNTER — Other Ambulatory Visit: Payer: Self-pay | Admitting: Internal Medicine

## 2015-08-14 ENCOUNTER — Encounter: Payer: Self-pay | Admitting: Cardiology

## 2015-08-20 DIAGNOSIS — Z79899 Other long term (current) drug therapy: Secondary | ICD-10-CM | POA: Diagnosis not present

## 2015-08-25 ENCOUNTER — Telehealth: Payer: Self-pay | Admitting: Cardiology

## 2015-08-25 ENCOUNTER — Ambulatory Visit (INDEPENDENT_AMBULATORY_CARE_PROVIDER_SITE_OTHER): Payer: Medicare Other | Admitting: *Deleted

## 2015-08-25 DIAGNOSIS — I255 Ischemic cardiomyopathy: Secondary | ICD-10-CM | POA: Diagnosis not present

## 2015-08-25 DIAGNOSIS — E7800 Pure hypercholesterolemia, unspecified: Secondary | ICD-10-CM | POA: Diagnosis not present

## 2015-08-25 DIAGNOSIS — E119 Type 2 diabetes mellitus without complications: Secondary | ICD-10-CM | POA: Diagnosis not present

## 2015-08-25 DIAGNOSIS — I10 Essential (primary) hypertension: Secondary | ICD-10-CM | POA: Diagnosis not present

## 2015-08-25 DIAGNOSIS — I2510 Atherosclerotic heart disease of native coronary artery without angina pectoris: Secondary | ICD-10-CM | POA: Diagnosis not present

## 2015-08-25 NOTE — Telephone Encounter (Signed)
Spoke with pt and reminded pt of remote transmission that is due today. Pt verbalized understanding.   

## 2015-08-25 NOTE — Progress Notes (Signed)
Remote ICD transmission.   

## 2015-08-28 DIAGNOSIS — Z79899 Other long term (current) drug therapy: Secondary | ICD-10-CM | POA: Diagnosis not present

## 2015-09-04 ENCOUNTER — Other Ambulatory Visit (HOSPITAL_COMMUNITY): Payer: Self-pay | Admitting: Internal Medicine

## 2015-09-05 LAB — CUP PACEART REMOTE DEVICE CHECK
Battery Remaining Longevity: 67 mo
Battery Remaining Percentage: 81 %
Brady Statistic RA Percent Paced: 9.9 %
Brady Statistic RV Percent Paced: 5 %
Date Time Interrogation Session: 20170616130239
HighPow Impedance: 50 Ohm
Implantable Lead Implant Date: 20160810
Implantable Lead Implant Date: 20160810
Implantable Lead Location: 753859
Implantable Lead Location: 753860
Lead Channel Impedance Value: 330 Ohm
Lead Channel Impedance Value: 430 Ohm
Lead Channel Pacing Threshold Amplitude: 1 V
Lead Channel Pacing Threshold Pulse Width: 0.5 ms
Lead Channel Sensing Intrinsic Amplitude: 1.4 mV
Lead Channel Sensing Intrinsic Amplitude: 11 mV
Lead Channel Setting Pacing Amplitude: 2.125
Lead Channel Setting Pacing Amplitude: 2.5 V
Lead Channel Setting Pacing Pulse Width: 0.5 ms
Lead Channel Setting Sensing Sensitivity: 0.5 mV
Pulse Gen Serial Number: 7179751

## 2015-09-10 ENCOUNTER — Encounter: Payer: Self-pay | Admitting: Cardiology

## 2015-09-10 ENCOUNTER — Other Ambulatory Visit: Payer: Self-pay

## 2015-09-10 ENCOUNTER — Ambulatory Visit (INDEPENDENT_AMBULATORY_CARE_PROVIDER_SITE_OTHER): Payer: Medicare Other

## 2015-09-10 DIAGNOSIS — I5022 Chronic systolic (congestive) heart failure: Secondary | ICD-10-CM | POA: Diagnosis not present

## 2015-09-10 LAB — ECHOCARDIOGRAM COMPLETE
AO mean calculated velocity dopler: 130 cm/s
AV Area VTI index: 0.84 cm2/m2
AV Area VTI: 1.38 cm2
AV Area mean vel: 1.56 cm2
AV Mean grad: 8 mmHg
AV Peak grad: 15 mmHg
AV VEL mean LVOT/AV: 0.38
AV area mean vel ind: 0.86 cm2/m2
AV peak Index: 0.76
AV pk vel: 196 cm/s
AV vel: 1.53
Ao pk vel: 0.33 m/s
E decel time: 201 msec
E/e' ratio: 6.89
FS: -2 % — AB (ref 28–44)
IVS/LV PW RATIO, ED: 2.18
LA ID, A-P, ES: 53 mm
LA diam end sys: 53 mm
LA diam index: 2.91 cm/m2
LA vol A4C: 71.5 ml
LA vol index: 47.4 mL/m2
LA vol: 86.3 mL
LV E/e' medial: 6.89
LV E/e'average: 6.89
LV PW d: 10.8 mm — AB (ref 0.6–1.1)
LV dias vol index: 102 mL/m2
LV dias vol: 186 mL — AB (ref 62–150)
LV e' LATERAL: 8.8 cm/s
LV sys vol index: 63 mL/m2
LV sys vol: 115 mL — AB (ref 21–61)
LVOT SV: 67 mL
LVOT VTI: 16.1 cm
LVOT area: 4.15 cm2
LVOT diameter: 23 mm
LVOT peak VTI: 0.37 cm
LVOT peak vel: 65 cm/s
MV Dec: 201
MV pk A vel: 83.4 m/s
MV pk E vel: 60.6 m/s
PV Reg grad dias: 9 mmHg
PV Reg vel dias: 147 cm/s
Simpson's disk: 38
Stroke v: 71 ml
TAPSE: 13.7 mm
TDI e' lateral: 8.8
TDI e' medial: 3.67
VTI: 43.7 cm
Valve area index: 0.84
Valve area: 1.53 cm2

## 2015-09-15 ENCOUNTER — Other Ambulatory Visit (HOSPITAL_COMMUNITY): Payer: Self-pay | Admitting: *Deleted

## 2015-09-15 ENCOUNTER — Telehealth (HOSPITAL_COMMUNITY): Payer: Self-pay

## 2015-09-15 DIAGNOSIS — I509 Heart failure, unspecified: Secondary | ICD-10-CM

## 2015-09-15 MED ORDER — SPIRONOLACTONE 25 MG PO TABS: 12.5000 mg | ORAL_TABLET | Freq: Every day | ORAL | Status: DC

## 2015-09-15 NOTE — Telephone Encounter (Addendum)
Notes Recorded by Effie Berkshire, RN on 09/15/2015 at 3:10 PM Wife aware and appreciative of results Notes Recorded by Larey Dresser, MD on 09/14/2015 at 10:27 PM EF 30-35%, mild aortic stenosis. EF is a bit better than prior. Please call patient.

## 2015-09-18 DIAGNOSIS — I5022 Chronic systolic (congestive) heart failure: Secondary | ICD-10-CM | POA: Diagnosis not present

## 2015-09-22 ENCOUNTER — Other Ambulatory Visit: Payer: Self-pay | Admitting: Internal Medicine

## 2015-09-25 ENCOUNTER — Other Ambulatory Visit (HOSPITAL_COMMUNITY): Payer: Self-pay | Admitting: Internal Medicine

## 2015-09-25 NOTE — Telephone Encounter (Signed)
Please advise 

## 2015-09-26 ENCOUNTER — Other Ambulatory Visit (HOSPITAL_COMMUNITY): Payer: Self-pay | Admitting: Internal Medicine

## 2015-09-30 DIAGNOSIS — I10 Essential (primary) hypertension: Secondary | ICD-10-CM | POA: Diagnosis not present

## 2015-09-30 DIAGNOSIS — F419 Anxiety disorder, unspecified: Secondary | ICD-10-CM | POA: Diagnosis not present

## 2015-09-30 DIAGNOSIS — E1122 Type 2 diabetes mellitus with diabetic chronic kidney disease: Secondary | ICD-10-CM | POA: Diagnosis not present

## 2015-09-30 DIAGNOSIS — I509 Heart failure, unspecified: Secondary | ICD-10-CM | POA: Diagnosis not present

## 2015-10-08 DIAGNOSIS — I10 Essential (primary) hypertension: Secondary | ICD-10-CM | POA: Diagnosis not present

## 2015-10-08 DIAGNOSIS — I2510 Atherosclerotic heart disease of native coronary artery without angina pectoris: Secondary | ICD-10-CM | POA: Diagnosis not present

## 2015-10-08 DIAGNOSIS — E119 Type 2 diabetes mellitus without complications: Secondary | ICD-10-CM | POA: Diagnosis not present

## 2015-10-08 DIAGNOSIS — E7800 Pure hypercholesterolemia, unspecified: Secondary | ICD-10-CM | POA: Diagnosis not present

## 2015-10-13 ENCOUNTER — Other Ambulatory Visit (HOSPITAL_COMMUNITY): Payer: Self-pay | Admitting: *Deleted

## 2015-10-13 MED ORDER — POTASSIUM CHLORIDE CRYS ER 20 MEQ PO TBCR: 20.0000 meq | EXTENDED_RELEASE_TABLET | ORAL | 1 refills | Status: DC | PRN

## 2015-11-11 DIAGNOSIS — I10 Essential (primary) hypertension: Secondary | ICD-10-CM | POA: Diagnosis not present

## 2015-11-11 DIAGNOSIS — E119 Type 2 diabetes mellitus without complications: Secondary | ICD-10-CM | POA: Diagnosis not present

## 2015-11-11 DIAGNOSIS — E7800 Pure hypercholesterolemia, unspecified: Secondary | ICD-10-CM | POA: Diagnosis not present

## 2015-11-11 DIAGNOSIS — I2510 Atherosclerotic heart disease of native coronary artery without angina pectoris: Secondary | ICD-10-CM | POA: Diagnosis not present

## 2015-11-13 ENCOUNTER — Encounter: Payer: Self-pay | Admitting: *Deleted

## 2015-11-14 ENCOUNTER — Encounter: Payer: Self-pay | Admitting: Cardiology

## 2015-11-14 ENCOUNTER — Ambulatory Visit (INDEPENDENT_AMBULATORY_CARE_PROVIDER_SITE_OTHER): Payer: Medicare Other | Admitting: Cardiology

## 2015-11-14 VITALS — BP 167/69 | HR 55 | Ht 68.0 in | Wt 157.0 lb

## 2015-11-14 DIAGNOSIS — I255 Ischemic cardiomyopathy: Secondary | ICD-10-CM | POA: Diagnosis not present

## 2015-11-14 DIAGNOSIS — I5042 Chronic combined systolic (congestive) and diastolic (congestive) heart failure: Secondary | ICD-10-CM | POA: Diagnosis not present

## 2015-11-14 DIAGNOSIS — N183 Chronic kidney disease, stage 3 unspecified: Secondary | ICD-10-CM

## 2015-11-14 NOTE — Addendum Note (Signed)
Addended by: Julian Hy T on: 11/14/2015 04:51 PM   Modules accepted: Orders

## 2015-11-14 NOTE — Progress Notes (Signed)
Cardiology Office Note  Date: 11/14/2015   ID: Clayton Morton, DOB 16-Oct-1932, MRN [id number]  PCP: Clayton Chroman, MD  Primary Cardiologist: Clayton Lesches, MD   Chief Complaint  Patient presents with  . Cardiomyopathy    History of Present Illness: Clayton Morton is an 82 y.o. male last seen in March. He is here for a routine follow-up visit. He reports NYHA class II dyspnea with his typical activities. He has not been playing much golf in the high heat and humidity. Takes care of his wife, driving her for various appointments, also visits his elderly sister. He does not report any angina symptoms or palpitations.  I reviewed his medications which are outlined below and stable from a cardiac perspective. He has not had any major fluctuations in weight and reports no leg edema or orthopnea. ECG today shows sinus bradycardia with right bundle branch block and left anterior fascicular block. He has not had any dizziness or syncope.  Labwork from may is outlined below.  Past Medical History:  Diagnosis Date  . Age-related macular degeneration, wet, right eye ([place])   . AICD (automatic cardioverter/defibrillator) present 10/30/2014  . Cerebrovascular accident [facility])    Right subcortical infarct 2006  . Chronic systolic congestive heart failure ([place])    a) ECHO (02/2012): EF 30-35%, no obvious thrombus b) ECHO (11/2013): EF 10%, AV mildly calcified, mild MR, RV mod dialted and sys fx sev reduced, TAPSE 0.8, lateral annulus peak S velocity 5.4 c) RHC (11/29/13): RA 15, RV: 65/19, PA 49%, CO/CI: 3.25 / 1.7 d) RHC (12/03/13): RA 1, RV 25/1/2, PA 27/10 (16), PCWP 6, Fick CO/CI: 5.3 / 2.9, PVR 1.9 WU, PA sat 72 and 74%   . Coronary atherosclerosis of native coronary artery    1) CABG 2005 2) LHC (910/15): patent SVG to PDA, occlusion of SVG sequence to PLA, patency of LIMA to LAD and patency of SVG to diagonal with sev stenosis at coronary anastomotic site  . Diabetes mellitus, type 2 ([place])   .  Essential hypertension, benign   . History of gout   . Hyperlipidemia   . Ischemic cardiomyopathy   . Kidney stones   . Macular degeneration    Dr. Zadie Morton  . Non-ST elevation MI (NSTEMI) ([place])    2005  . Varicose veins     Past Surgical History:  Procedure Laterality Date  . CARDIAC CATHETERIZATION    . CARDIAC DEFIBRILLATOR PLACEMENT  10/30/2014  . CATARACT EXTRACTION W/ INTRAOCULAR LENS  IMPLANT, BILATERAL Bilateral ~ 1991  . CORONARY ANGIOPLASTY    . CORONARY ARTERY BYPASS GRAFT  2005   LIMA to LAD, SVG diagonal, SVG to PDA and PLA  . CYSTOSCOPY W/ LITHOLAPAXY / EHL    . EP IMPLANTABLE DEVICE N/A 10/30/2014   Procedure: ICD Implant;  Surgeon: Deboraha Sprang, MD, SJM Ellipse DR implanted for primary prevention  . LEFT AND RIGHT HEART CATHETERIZATION WITH CORONARY/GRAFT ANGIOGRAM N/A 11/29/2013   Procedure: LEFT AND RIGHT HEART CATHETERIZATION WITH Beatrix Fetters;  Surgeon: Blane Ohara, MD;  Location: [facility] CATH LAB;  Service: Cardiovascular;  Laterality: N/A;  . RIGHT HEART CATHETERIZATION N/A 12/03/2013   Procedure: RIGHT HEART CATH;  Surgeon: Jolaine Artist, MD;  Location: [facility] CATH LAB;  Service: Cardiovascular;  Laterality: N/A;  . TONSILLECTOMY      Current Outpatient Prescriptions  Medication Sig Dispense Refill  . ALPRAZolam (XANAX) 0.25 MG tablet Take 0.5 tablets by mouth daily.  0  .  aspirin 81 MG tablet Take 81 mg by mouth daily.    [name] atorvastatin (LIPITOR) 80 MG tablet take 1 tablet by mouth once daily 90 tablet 1  . carvedilol (COREG) 12.5 MG tablet Take 1 tablet (12.5 mg total) by mouth 2 (two) times daily with a meal. 60 tablet 6  . DIGITEK 125 MCG tablet take 1 tablet by mouth once daily 30 tablet 11  . furosemide (LASIX) 40 MG tablet Take 40 mg by mouth as needed.    [name] glipiZIDE (GLUCOTROL XL) 5 MG 24 hr tablet Take 1 tablet by mouth 2 (two) times daily.  1  . hydrALAZINE (APRESOLINE) 50 MG tablet take 1 and 1/2 tablets by mouth three times a day 405  tablet 1  . isosorbide mononitrate (IMDUR) 60 MG 24 hr tablet Take 1 tablet (60 mg total) by mouth daily. 90 tablet 1  . levothyroxine (SYNTHROID, LEVOTHROID) 50 MCG tablet Take 50 mcg by mouth every morning.     [name] losartan (COZAAR) 50 MG tablet Take 1 tab by mouth every morning & 1/2 tab every evening (75mg  total for the day) 135 tablet 3  . metFORMIN (GLUCOPHAGE) 500 MG tablet Take 500 mg by mouth 2 (two) times daily with a meal.    . Omega-3 Fatty Acids (FISH OIL) 1000 MG CAPS Take 1 capsule by mouth 2 (two) times daily.    . ONE TOUCH ULTRA TEST test strip 1 each by Other route 2 (two) times daily.   0  . potassium chloride SA (K-DUR,KLOR-CON) 20 MEQ tablet Take 1 tablet (20 mEq total) by mouth as needed. TAKE ONLY IF TALKING LASIX 90 tablet 1  . spironolactone (ALDACTONE) 25 MG tablet Take 0.5 tablets (12.5 mg total) by mouth daily. 45 tablet 1  . traZODone (DESYREL) 50 MG tablet Take 50 mg by mouth at bedtime as needed for sleep. For sleep  0   No current facility-administered medications for this visit.    Allergies:  Tetracycline   Social History: The patient  reports that he has never smoked. He has never used smokeless tobacco. He reports that he does not drink alcohol or use drugs.   ROS:  Please see the history of present illness. Otherwise, complete review of systems is positive for restless legs with some insomnia.  All other systems are reviewed and negative.   Physical Exam: VS:  BP (!) 167/69   Pulse (!) 55   Ht 5\' 8"  (1.727 m)   Wt 157 lb (71.2 kg)   BMI 23.87 kg/m , BMI Body mass index is 23.87 kg/m.  Wt Readings from Last 3 Encounters:  11/14/15 157 lb (71.2 kg)  08/05/15 151 lb 12 oz (68.8 kg)  06/17/15 158 lb 6.4 oz (71.8 kg)    General: Elderly male, appears comfortable at rest. HEENT: Conjunctiva and lids normal, oropharynx clear. Neck: Supple, no elevated JVP or carotid bruits, no thyromegaly. Lungs: Clear to auscultation, nonlabored breathing at  rest. Cardiac: Regular rate and rhythm, no S3. Abdomen: Soft, nontender, bowel sounds present. Extremities: No pitting edema, distal pulses 2+.  ECG: I personally reviewed the tracing from 11/08/2014 which showed a ventricular paced rhythm.  Recent Labwork: 08/05/2015: BUN 43; Creatinine, Ser 1.99; Potassium 4.2; Sodium 139     Component Value Date/Time   CHOL 111 08/05/2015 1448   TRIG 156 (H) 08/05/2015 1448   HDL 31 (L) 08/05/2015 1448   CHOLHDL 3.6 08/05/2015 1448   VLDL 31 08/05/2015 1448   LDLCALC 49  08/05/2015 1448    Other Studies Reviewed Today:  Echocardiogram 08/28/2014: Study Conclusions  - Left ventricle: The cavity size was mildly to moderately dilated. There was moderate concentric hypertrophy. Systolic function was severely reduced. The estimated ejection fraction was in the range of 25% to 30%. Doppler parameters are consistent with abnormal left ventricular relaxation (grade 1 diastolic dysfunction). - Regional wall motion abnormality: Akinesis of the mid inferior myocardium; severe hypokinesis of the mid inferoseptal, basal inferior, and basal-mid inferolateral myocardium; moderate hypokinesis of the mid anteroseptal and apical inferior myocardium. The remaining walls are moderately hypokinetic. - Ventricular septum: Septal motion showed abnormal function and dyssynergy. These changes are consistent with intraventricular conduction delay. - Aortic valve: Mildly to moderately calcified annulus. Trileaflet; moderately thickened, mildly calcified leaflets. There was no stenosis. There was trivial regurgitation. - Mitral valve: There was mild to moderate regurgitation. - Left atrium: The atrium was severely dilated. Volume/bsa ES (A/L): 44.2 ml/m^2. - Right ventricle: Systolic function was moderately reduced. - Pulmonic valve: There was mild regurgitation.  Assessment and Plan:  1. Chronic combined heart failure, clinically  stable with no evidence of volume overload today. Plan to continue present regimen for now. Probably not a good candidate to switch to [place] with renal dysfunction in current range. We will stop Lanoxin as well.  2. Ischemic cardiomyopathy with LVEF 25-30% and grade 1 diastolic dysfunction.  3. CKD stage 3, creatinine 1.6-1.9.  Current medicines were reviewed with the patient today.   Orders Placed This Encounter  Procedures  . EKG 12-Lead    Disposition: Follow-up with me in 4 months.  Signed, Satira Sark, MD, [facility] 11/14/2015 4:42 PM    [facility] [place], [place], [place] [address] Phone: [phone]; Fax: [phone]

## 2015-11-14 NOTE — Patient Instructions (Addendum)
Your physician recommends that you schedule a follow-up appointment in: [place]  Your physician has recommended you make the following change in your medication:   STOP DIGOXIN  Thank you for choosing [facility]!!

## 2015-11-25 ENCOUNTER — Other Ambulatory Visit (HOSPITAL_COMMUNITY): Payer: Self-pay | Admitting: *Deleted

## 2015-11-25 DIAGNOSIS — I509 Heart failure, unspecified: Secondary | ICD-10-CM

## 2015-11-25 MED ORDER — ISOSORBIDE MONONITRATE ER 60 MG PO TB24: 60.0000 mg | ORAL_TABLET | Freq: Every day | ORAL | 1 refills | Status: DC

## 2015-11-26 ENCOUNTER — Ambulatory Visit (INDEPENDENT_AMBULATORY_CARE_PROVIDER_SITE_OTHER): Payer: Medicare Other | Admitting: *Deleted

## 2015-11-26 DIAGNOSIS — I255 Ischemic cardiomyopathy: Secondary | ICD-10-CM

## 2015-11-26 NOTE — Progress Notes (Signed)
Remote ICD transmission.   

## 2015-11-28 ENCOUNTER — Encounter: Payer: Self-pay | Admitting: Cardiology

## 2015-12-05 LAB — CUP PACEART REMOTE DEVICE CHECK
Battery Remaining Longevity: 73 mo
Battery Remaining Percentage: 78 %
Battery Voltage: 2.98 V
Brady Statistic AP VP Percent: 6 %
Brady Statistic AP VS Percent: 8.8 %
Brady Statistic AS VP Percent: 1 %
Brady Statistic AS VS Percent: 83 %
Brady Statistic RA Percent Paced: 12 %
Brady Statistic RV Percent Paced: 6.1 %
Date Time Interrogation Session: 20170906114219
HighPow Impedance: 54 Ohm
HighPow Impedance: 54 Ohm
Implantable Lead Implant Date: 20160810
Implantable Lead Implant Date: 20160810
Implantable Lead Location: 753859
Implantable Lead Location: 753860
Lead Channel Impedance Value: 340 Ohm
Lead Channel Impedance Value: 430 Ohm
Lead Channel Pacing Threshold Amplitude: 0.625 V
Lead Channel Pacing Threshold Amplitude: 1.125 V
Lead Channel Pacing Threshold Pulse Width: 0.5 ms
Lead Channel Pacing Threshold Pulse Width: 0.5 ms
Lead Channel Sensing Intrinsic Amplitude: 1.7 mV
Lead Channel Sensing Intrinsic Amplitude: 11.1 mV
Lead Channel Setting Pacing Amplitude: 2.125
Lead Channel Setting Pacing Amplitude: 2.5 V
Lead Channel Setting Pacing Pulse Width: 0.5 ms
Lead Channel Setting Sensing Sensitivity: 0.5 mV
Pulse Gen Serial Number: 7179751

## 2015-12-25 DIAGNOSIS — H353212 Exudative age-related macular degeneration, right eye, with inactive choroidal neovascularization: Secondary | ICD-10-CM | POA: Diagnosis not present

## 2015-12-25 DIAGNOSIS — H353122 Nonexudative age-related macular degeneration, left eye, intermediate dry stage: Secondary | ICD-10-CM | POA: Diagnosis not present

## 2016-01-06 DIAGNOSIS — G4700 Insomnia, unspecified: Secondary | ICD-10-CM | POA: Diagnosis not present

## 2016-01-06 DIAGNOSIS — I509 Heart failure, unspecified: Secondary | ICD-10-CM | POA: Diagnosis not present

## 2016-01-06 DIAGNOSIS — I10 Essential (primary) hypertension: Secondary | ICD-10-CM | POA: Diagnosis not present

## 2016-01-06 DIAGNOSIS — E1122 Type 2 diabetes mellitus with diabetic chronic kidney disease: Secondary | ICD-10-CM | POA: Diagnosis not present

## 2016-01-06 DIAGNOSIS — Z299 Encounter for prophylactic measures, unspecified: Secondary | ICD-10-CM | POA: Diagnosis not present

## 2016-01-09 DIAGNOSIS — E7800 Pure hypercholesterolemia, unspecified: Secondary | ICD-10-CM | POA: Diagnosis not present

## 2016-01-09 DIAGNOSIS — I2510 Atherosclerotic heart disease of native coronary artery without angina pectoris: Secondary | ICD-10-CM | POA: Diagnosis not present

## 2016-01-09 DIAGNOSIS — E119 Type 2 diabetes mellitus without complications: Secondary | ICD-10-CM | POA: Diagnosis not present

## 2016-01-09 DIAGNOSIS — I10 Essential (primary) hypertension: Secondary | ICD-10-CM | POA: Diagnosis not present

## 2016-01-24 DIAGNOSIS — Z7984 Long term (current) use of oral hypoglycemic drugs: Secondary | ICD-10-CM | POA: Diagnosis not present

## 2016-01-24 DIAGNOSIS — Z8673 Personal history of transient ischemic attack (TIA), and cerebral infarction without residual deficits: Secondary | ICD-10-CM | POA: Diagnosis not present

## 2016-01-24 DIAGNOSIS — I252 Old myocardial infarction: Secondary | ICD-10-CM | POA: Diagnosis not present

## 2016-01-24 DIAGNOSIS — R404 Transient alteration of awareness: Secondary | ICD-10-CM | POA: Diagnosis not present

## 2016-01-24 DIAGNOSIS — E119 Type 2 diabetes mellitus without complications: Secondary | ICD-10-CM | POA: Diagnosis not present

## 2016-01-24 DIAGNOSIS — R55 Syncope and collapse: Secondary | ICD-10-CM | POA: Diagnosis not present

## 2016-01-24 DIAGNOSIS — Z951 Presence of aortocoronary bypass graft: Secondary | ICD-10-CM | POA: Diagnosis not present

## 2016-01-24 DIAGNOSIS — Z7982 Long term (current) use of aspirin: Secondary | ICD-10-CM | POA: Diagnosis not present

## 2016-01-24 DIAGNOSIS — R4182 Altered mental status, unspecified: Secondary | ICD-10-CM | POA: Diagnosis not present

## 2016-01-24 DIAGNOSIS — I959 Hypotension, unspecified: Secondary | ICD-10-CM | POA: Diagnosis not present

## 2016-01-24 DIAGNOSIS — I509 Heart failure, unspecified: Secondary | ICD-10-CM | POA: Diagnosis not present

## 2016-01-24 DIAGNOSIS — E860 Dehydration: Secondary | ICD-10-CM | POA: Diagnosis not present

## 2016-01-24 DIAGNOSIS — Z79899 Other long term (current) drug therapy: Secondary | ICD-10-CM | POA: Diagnosis not present

## 2016-01-27 DIAGNOSIS — E7800 Pure hypercholesterolemia, unspecified: Secondary | ICD-10-CM | POA: Diagnosis not present

## 2016-01-27 DIAGNOSIS — I10 Essential (primary) hypertension: Secondary | ICD-10-CM | POA: Diagnosis not present

## 2016-01-27 DIAGNOSIS — I2510 Atherosclerotic heart disease of native coronary artery without angina pectoris: Secondary | ICD-10-CM | POA: Diagnosis not present

## 2016-01-27 DIAGNOSIS — E119 Type 2 diabetes mellitus without complications: Secondary | ICD-10-CM | POA: Diagnosis not present

## 2016-02-20 ENCOUNTER — Ambulatory Visit (INDEPENDENT_AMBULATORY_CARE_PROVIDER_SITE_OTHER): Payer: Medicare Other | Admitting: Internal Medicine

## 2016-02-20 ENCOUNTER — Encounter: Payer: Self-pay | Admitting: Internal Medicine

## 2016-02-20 DIAGNOSIS — I255 Ischemic cardiomyopathy: Secondary | ICD-10-CM

## 2016-02-20 DIAGNOSIS — I2589 Other forms of chronic ischemic heart disease: Secondary | ICD-10-CM | POA: Diagnosis not present

## 2016-02-20 DIAGNOSIS — I5022 Chronic systolic (congestive) heart failure: Secondary | ICD-10-CM

## 2016-02-20 NOTE — Progress Notes (Signed)
Electrophysiology Office Note   Date:  02/20/2016   ID:  Clayton Morton, DOB Jul 31, 1932, MRN [id number]  PCP:  Glenda Chroman, MD  Cardiologist:  Dr Domenic Polite Followed in CHF clinic also Primary Electrophysiologist: Thompson Grayer, MD    CC: chf   History of Present Illness: Clayton Morton is a 82 y.o. male who presents today for electrophysiology evaluation.   Doing well s/p ICD implantation for primary prevention of SCD by Dr Caryl Comes 8/16.  No new concerns today.  He is a Audiological scientist.  He also attended [employer] and studied music for several years.  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  Today, he denies symptoms of palpitations, chest pain, shortness of breath, orthopnea, PND, lower extremity edema, claudication, dizziness, presyncope, syncope, bleeding, or neurologic sequela. The patient is tolerating medications without difficulties and is otherwise without complaint today.    Past Medical History:  Diagnosis Date  . Age-related macular degeneration, wet, right eye ([place])   . AICD (automatic cardioverter/defibrillator) present 10/30/2014  . Cerebrovascular accident [facility])    Right subcortical infarct 2006  . Chronic systolic congestive heart failure ([place])    a) ECHO (02/2012): EF 30-35%, no obvious thrombus b) ECHO (11/2013): EF 10%, AV mildly calcified, mild MR, RV mod dialted and sys fx sev reduced, TAPSE 0.8, lateral annulus peak S velocity 5.4 c) RHC (11/29/13): RA 15, RV: 65/19, PA 49%, CO/CI: 3.25 / 1.7 d) RHC (12/03/13): RA 1, RV 25/1/2, PA 27/10 (16), PCWP 6, Fick CO/CI: 5.3 / 2.9, PVR 1.9 WU, PA sat 72 and 74%   . Coronary atherosclerosis of native coronary artery    1) CABG 2005 2) LHC (910/15): patent SVG to PDA, occlusion of SVG sequence to PLA, patency of LIMA to LAD and patency of SVG to diagonal with sev stenosis at coronary anastomotic site  . Diabetes mellitus, type 2 ([place])   . Essential hypertension, benign   . History of gout   .  Hyperlipidemia   . Ischemic cardiomyopathy   . Kidney stones   . Macular degeneration    Dr. [name]  . Non-ST elevation MI (NSTEMI) ([place])    2005  . Varicose veins    Past Surgical History:  Procedure Laterality Date  . CARDIAC CATHETERIZATION    . CARDIAC DEFIBRILLATOR PLACEMENT  10/30/2014  . CATARACT EXTRACTION W/ INTRAOCULAR LENS  IMPLANT, BILATERAL Bilateral ~ 1991  . CORONARY ANGIOPLASTY    . CORONARY ARTERY BYPASS GRAFT  2005   LIMA to LAD, SVG diagonal, SVG to PDA and PLA  . CYSTOSCOPY W/ LITHOLAPAXY / EHL    . EP IMPLANTABLE DEVICE N/A 10/30/2014   Procedure: ICD Implant;  Surgeon: Deboraha Sprang, MD, SJM Ellipse DR implanted for primary prevention  . LEFT AND RIGHT HEART CATHETERIZATION WITH CORONARY/GRAFT ANGIOGRAM N/A 11/29/2013   Procedure: LEFT AND RIGHT HEART CATHETERIZATION WITH Beatrix Fetters;  Surgeon: Blane Ohara, MD;  Location[facility] CATH LAB;  Service: Cardiovascular;  Laterality: N/A;  . RIGHT HEART CATHETERIZATION N/A 12/03/2013   Procedure: RIGHT HEART CATH;  Surgeon: Jolaine Artist, MD;  Location: [facility] CATH LAB;  Service: Cardiovascular;  Laterality: N/A;  . TONSILLECTOMY       Current Outpatient Prescriptions  Medication Sig Dispense Refill  . ALPRAZolam (XANAX) 0.25 MG tablet Take 0.5 tablets by mouth daily.  0  . aspirin 81 MG tablet Take 81 mg by mouth daily.    [name] atorvastatin (LIPITOR) 80 MG  tablet take 1 tablet by mouth once daily 90 tablet 1  . carvedilol (COREG) 12.5 MG tablet Take 1 tablet (12.5 mg total) by mouth 2 (two) times daily with a meal. 60 tablet 6  . furosemide (LASIX) 40 MG tablet Take 40 mg by mouth as needed.    [name] glipiZIDE (GLUCOTROL XL) 5 MG 24 hr tablet Take 1 tablet by mouth 2 (two) times daily.  1  . hydrALAZINE (APRESOLINE) 50 MG tablet take 1 and 1/2 tablets by mouth three times a day 405 tablet 1  . isosorbide mononitrate (IMDUR) 60 MG 24 hr tablet Take 1 tablet (60 mg total) by mouth daily. 90 tablet 1  .  levothyroxine (SYNTHROID, LEVOTHROID) 50 MCG tablet Take 50 mcg by mouth every morning.     [name] losartan (COZAAR) 50 MG tablet Take 1 tab by mouth every morning & 1/2 tab every evening (75mg  total for the day) 135 tablet 3  . metFORMIN (GLUCOPHAGE) 500 MG tablet Take 500 mg by mouth 2 (two) times daily with a meal.    . Omega-3 Fatty Acids (FISH OIL) 1000 MG CAPS Take 1 capsule by mouth 2 (two) times daily.    . ONE TOUCH ULTRA TEST test strip 1 each by Other route 2 (two) times daily.   0  . potassium chloride SA (K-DUR,KLOR-CON) 20 MEQ tablet Take 1 tablet (20 mEq total) by mouth as needed. TAKE ONLY IF TALKING LASIX 90 tablet 1  . spironolactone (ALDACTONE) 25 MG tablet Take 0.5 tablets (12.5 mg total) by mouth daily. 45 tablet 1   No current facility-administered medications for this visit.     Allergies:   Tetracycline   Social History:  The patient  reports that he has never smoked. He has never used smokeless tobacco. He reports that he does not drink alcohol or use drugs.   Family History:  The patient's family history includes Dementia in his mother; Hypertension in his father.    ROS:  Please see the history of present illness.   All other systems are reviewed and negative.    PHYSICAL EXAM: VS:  BP (!) 108/58   Pulse 62   Ht 5\' 8"  (1.727 m)   Wt 161 lb 9.6 oz (73.3 kg)   SpO2 97%   BMI 24.57 kg/m  , BMI Body mass index is 24.57 kg/m. GEN: Well nourished, well developed, in no acute distress  HEENT: normal  Neck: no JVD, carotid bruits, or masses Cardiac: RRR; no murmurs, rubs, or gallops,no edema  Respiratory:  clear to auscultation bilaterally, normal work of breathing GI: soft, nontender, nondistended, + BS MS: no deformity or atrophy  Skin: warm and dry, device pocket is well healed Neuro:  Strength and sensation are intact Psych: euthymic mood, full affect  Device interrogation is reviewed today in detail.  See PaceArt for details.   Recent Labs: 08/05/2015:  BUN 43; Creatinine, Ser 1.99; Potassium 4.2; Sodium 139    Lipid Panel     Component Value Date/Time   CHOL 111 08/05/2015 1448   TRIG 156 (H) 08/05/2015 1448   HDL 31 (L) 08/05/2015 1448   CHOLHDL 3.6 08/05/2015 1448   VLDL 31 08/05/2015 1448   LDLCALC 49 08/05/2015 1448     Wt Readings from Last 3 Encounters:  02/20/16 161 lb 9.6 oz (73.3 kg)  11/14/15 157 lb (71.2 kg)  08/05/15 151 lb 12 oz (68.8 kg)     ASSESSMENT AND PLAN:  1.  Ischemic CM/ Chronic  systolic dysfunction Doing well s/p ICD see paceart No changes today Will enroll in ICM device clinic with Sharman Cheek  2. HTN Stable No change required today   Follow-up: merlin Return to see me in 1 year  Current medicines are reviewed at length with the patient today.   The patient does not have concerns regarding his medicines.  The following changes were made today:  none   Signed, Thompson Grayer, MD  02/20/2016 10:24 AM     [facility] [address] [place] [address] [phone] (office) [phone] (fax)

## 2016-02-20 NOTE — Patient Instructions (Signed)
Medication Instructions:  Continue all current medications.  Labwork: none  Testing/Procedures: none  Follow-Up: Your physician wants you to follow up in:  1 year.  You will receive a reminder letter in the mail one-two months in advance.  If you don't receive a letter, please call our office to schedule the follow up appointment   Any Other Special Instructions Will Be Listed Below (If Applicable). Remote monitoring is used to monitor your Pacemaker of ICD from home. This monitoring reduces the number of office visits required to check your device to one time per year. It allows [place] to keep an eye on the functioning of your device to ensure it is working properly. You are scheduled for a device check from home on 05/24/2016. You may send your transmission at any time that day. If you have a wireless device, the transmission will be sent automatically. After your physician reviews your transmission, you will receive a postcard with your next transmission date.  If you need a refill on your cardiac medications before your next appointment, please call your pharmacy.

## 2016-02-23 ENCOUNTER — Other Ambulatory Visit (HOSPITAL_COMMUNITY): Payer: Self-pay | Admitting: Cardiology

## 2016-02-23 ENCOUNTER — Other Ambulatory Visit: Payer: Self-pay | Admitting: Internal Medicine

## 2016-02-23 DIAGNOSIS — I509 Heart failure, unspecified: Secondary | ICD-10-CM

## 2016-02-24 LAB — CUP PACEART INCLINIC DEVICE CHECK
Brady Statistic RA Percent Paced: 10 %
Brady Statistic RV Percent Paced: 4.8 %
Date Time Interrogation Session: 20171201155530
HighPow Impedance: 56.25 Ohm
Implantable Lead Implant Date: 20160810
Implantable Lead Implant Date: 20160810
Implantable Lead Location: 753859
Implantable Lead Location: 753860
Implantable Pulse Generator Implant Date: 20160810
Lead Channel Impedance Value: 312.5 Ohm
Lead Channel Impedance Value: 425 Ohm
Lead Channel Pacing Threshold Amplitude: 1 V
Lead Channel Pacing Threshold Amplitude: 1 V
Lead Channel Pacing Threshold Amplitude: 1.5 V
Lead Channel Pacing Threshold Amplitude: 1.5 V
Lead Channel Pacing Threshold Pulse Width: 0.5 ms
Lead Channel Pacing Threshold Pulse Width: 0.5 ms
Lead Channel Pacing Threshold Pulse Width: 0.5 ms
Lead Channel Pacing Threshold Pulse Width: 0.5 ms
Lead Channel Sensing Intrinsic Amplitude: 1.4 mV
Lead Channel Sensing Intrinsic Amplitude: 10.6 mV
Lead Channel Setting Pacing Amplitude: 2.375
Lead Channel Setting Pacing Amplitude: 2.5 V
Lead Channel Setting Pacing Pulse Width: 0.5 ms
Lead Channel Setting Sensing Sensitivity: 0.5 mV
Pulse Gen Serial Number: 7179751

## 2016-03-10 ENCOUNTER — Other Ambulatory Visit (HOSPITAL_COMMUNITY): Payer: Self-pay | Admitting: Internal Medicine

## 2016-03-12 ENCOUNTER — Telehealth: Payer: Self-pay

## 2016-03-12 NOTE — Telephone Encounter (Signed)
Agree with using Lasix and potassium supplements for the next few days to try to get his weight coming down somewhat.

## 2016-03-12 NOTE — Telephone Encounter (Signed)
Referred to ICM clinic by Dr Rayann Heman.  Call to patient and provided ICM intro and he agreed to monthly calls.    He stated he is accumulating fluid because his weight has increased by 4-5 pounds in last week.  He stated today weight is 154 lbs but he normally tries to stay between 149-152 pounds.  Dr Haroldine Laws told him to call if his weight is >159 lbs.  He takes Furosemide 20 mg 1 tablet and with that takes Potassium 20 meq prn when he has weight gain or other fluid symptoms.  He was not sure if he should take one.   Advised to take the Furosemide and Potassium that he has prescribed since weight is up 4-5 lbs.  He is worried about retaining fluid.  Advised will call back on 12/26 and have him send a manual transmission and I will forward a copy to Dr Domenic Polite for review at his appointment on 03/17/2016.

## 2016-03-16 ENCOUNTER — Ambulatory Visit (INDEPENDENT_AMBULATORY_CARE_PROVIDER_SITE_OTHER): Payer: Medicare Other

## 2016-03-16 DIAGNOSIS — Z959 Presence of cardiac and vascular implant and graft, unspecified: Secondary | ICD-10-CM | POA: Diagnosis not present

## 2016-03-16 DIAGNOSIS — I5022 Chronic systolic (congestive) heart failure: Secondary | ICD-10-CM | POA: Diagnosis not present

## 2016-03-16 NOTE — Progress Notes (Signed)
EPIC Encounter for ICM Monitoring  Patient Name: Clayton Morton is a 82 y.o. male Date: 03/16/2016 Primary Care Physican: Glenda Chroman, MD Primary Cardiologist: [facility] Electrophysiologist: Allred Dry Weight: 152.4 lbs        1st ICM encounter. Heart Failure questions reviewed, pt asymptomatic.  Weight has decreased since 12/22.   Thoracic impedance normal after taking prn Furosemide 12/22.  Was abnormal suggesting fluid accumulation from 12/4 to 12/11 and 12/19 to 12/21.  At [facility] intro call on 12/22, patient had gained 4-5 pounds and took prn Furosemide and Potassium.   Recommendations:  No changes.  Reinforced to limit low salt food choices to 2000 mg day and limiting fluid intake to < 2 liters per day. Encouraged to call for fluid symptoms.  Provided ICM direct phone number  Follow-up plan: ICM clinic phone appointment on 04/16/2016.  Office appointment with Dr Domenic Polite on 03/17/2016.  Copy of ICM check sent to primary cardiologist and device physician.   ICM trend: 03/16/2016       Rosalene Billings, RN 03/16/2016 8:39 AM

## 2016-03-17 ENCOUNTER — Encounter: Payer: Self-pay | Admitting: Cardiology

## 2016-03-17 ENCOUNTER — Ambulatory Visit (INDEPENDENT_AMBULATORY_CARE_PROVIDER_SITE_OTHER): Payer: Medicare Other | Admitting: Cardiology

## 2016-03-17 VITALS — BP 144/68 | HR 69 | Ht 68.0 in | Wt 158.0 lb

## 2016-03-17 DIAGNOSIS — I5042 Chronic combined systolic (congestive) and diastolic (congestive) heart failure: Secondary | ICD-10-CM

## 2016-03-17 DIAGNOSIS — I2589 Other forms of chronic ischemic heart disease: Secondary | ICD-10-CM | POA: Diagnosis not present

## 2016-03-17 DIAGNOSIS — N183 Chronic kidney disease, stage 3 unspecified: Secondary | ICD-10-CM

## 2016-03-17 DIAGNOSIS — I5022 Chronic systolic (congestive) heart failure: Secondary | ICD-10-CM | POA: Diagnosis not present

## 2016-03-17 DIAGNOSIS — I255 Ischemic cardiomyopathy: Secondary | ICD-10-CM

## 2016-03-17 NOTE — Patient Instructions (Signed)
Medication Instructions:  Continue all current medications.  Labwork: none  Testing/Procedures: none  Follow-Up: Your physician wants you to follow up in:  4 months.  You will receive a reminder letter in the mail one-two months in advance.  If you don't receive a letter, please call our office to schedule the follow up appointment   Any Other Special Instructions Will Be Listed Below (If Applicable). Dr. Aundra Dubin - heart failure clinic follow up in the next 2 months.    If you need a refill on your cardiac medications before your next appointment, please call your pharmacy.

## 2016-03-17 NOTE — Progress Notes (Signed)
Cardiology Office Note  Date: 03/17/2016   ID: WYNTON HUFSTETLER, DOB [birth year], MRN [id number]  PCP: Glenda Chroman, MD  Primary Cardiologist: Rozann Lesches, MD   Chief Complaint  Patient presents with  . Cardiomyopathy    History of Present Illness: Clayton Morton is an 82 y.o. male last seen in August. He is here today with his wife for a follow-up visit. States that he has been feeling well, his weight is down. He did have to take his Lasix more regularly due to elevated thoracic impedance with ICD check, but measurements are back to baseline. He had also been eating out regularly with his wife, higher sodium intake and fluids. He has stopped this.  Recent visit with [name] noted, St. Jude ICD in place. He does not report any palpitations, syncope, or device discharges.  I reviewed his medications which are outlined below. Cardiac regimen as otherwise stable. He uses Lasix when necessary. Echocardiogram from June showed LVEF 30-35% range. He is not reporting any angina symptoms.  Past Medical History:  Diagnosis Date  . Age-related macular degeneration, wet, right eye ([place])   . AICD (automatic cardioverter/defibrillator) present 10/30/2014  . Cerebrovascular accident [facility])    Right subcortical infarct 2006  . Chronic systolic congestive heart failure ([place])    a) ECHO (02/2012): EF 30-35%, no obvious thrombus b) ECHO (11/2013): EF 10%, AV mildly calcified, mild MR, RV mod dialted and sys fx sev reduced, TAPSE 0.8, lateral annulus peak S velocity 5.4 c) RHC (11/29/13): RA 15, RV: 65/19, PA 49%, CO/CI: 3.25 / 1.7 d) RHC (12/03/13): RA 1, RV 25/1/2, PA 27/10 (16), PCWP 6, Fick CO/CI: 5.3 / 2.9, PVR 1.9 WU, PA sat 72 and 74%   . Coronary atherosclerosis of native coronary artery    1) CABG 2005 2) LHC (910/15): patent SVG to PDA, occlusion of SVG sequence to PLA, patency of LIMA to LAD and patency of SVG to diagonal with sev stenosis at coronary anastomotic site  . Diabetes mellitus,  type 2 ([place])   . Essential hypertension, benign   . History of gout   . Hyperlipidemia   . Ischemic cardiomyopathy   . Kidney stones   . Macular degeneration    Dr. [name]  . Non-ST elevation MI (NSTEMI) ([place])    2005  . Varicose veins     Past Surgical History:  Procedure Laterality Date  . CARDIAC CATHETERIZATION    . CARDIAC DEFIBRILLATOR PLACEMENT  10/30/2014  . CATARACT EXTRACTION W/ INTRAOCULAR LENS  IMPLANT, BILATERAL Bilateral ~ 1991  . CORONARY ANGIOPLASTY    . CORONARY ARTERY BYPASS GRAFT  2005   LIMA to LAD, SVG diagonal, SVG to PDA and PLA  . CYSTOSCOPY W/ LITHOLAPAXY / EHL    . EP IMPLANTABLE DEVICE N/A 10/30/2014   Procedure: ICD Implant;  Surgeon: Deboraha Sprang, MD, SJM Ellipse DR implanted for primary prevention  . LEFT AND RIGHT HEART CATHETERIZATION WITH CORONARY/GRAFT ANGIOGRAM N/A 11/29/2013   Procedure: LEFT AND RIGHT HEART CATHETERIZATION WITH Beatrix Fetters;  Surgeon: Blane Ohara, MD;  [facility] LAB;  Service: Cardiovascular;  Laterality: N/A;  . RIGHT HEART CATHETERIZATION N/A 12/03/2013   Procedure: RIGHT HEART CATH;  Surgeon: Jolaine Artist, MD;  Location: [facility] CATH LAB;  Service: Cardiovascular;  Laterality: N/A;  . TONSILLECTOMY      Current Outpatient Prescriptions  Medication Sig Dispense Refill  . ALPRAZolam (XANAX) 0.25 MG tablet Take 0.5 tablets by mouth daily.  0  . aspirin 81 MG tablet Take 81 mg by mouth daily.    [name] atorvastatin (LIPITOR) 80 MG tablet take 1 tablet by mouth once daily 90 tablet 1  . carvedilol (COREG) 12.5 MG tablet Take 1 tablet (12.5 mg total) by mouth 2 (two) times daily with a meal. 60 tablet 6  . furosemide (LASIX) 40 MG tablet take 1 tablet by mouth once daily 90 tablet 1  . glipiZIDE (GLUCOTROL XL) 5 MG 24 hr tablet Take 1 tablet by mouth 2 (two) times daily.  1  . hydrALAZINE (APRESOLINE) 50 MG tablet take 1 and 1/2 tablets by mouth three times a day 405 tablet 1  . isosorbide mononitrate  (IMDUR) 60 MG 24 hr tablet Take 1 tablet (60 mg total) by mouth daily. 90 tablet 1  . levothyroxine (SYNTHROID, LEVOTHROID) 50 MCG tablet Take 50 mcg by mouth every morning.     [name] losartan (COZAAR) 50 MG tablet Take 1 tab by mouth every morning & 1/2 tab every evening (75mg  total for the day) 135 tablet 3  . metFORMIN (GLUCOPHAGE) 500 MG tablet Take 500 mg by mouth 2 (two) times daily with a meal.    . Omega-3 Fatty Acids (FISH OIL) 1000 MG CAPS Take 1 capsule by mouth 2 (two) times daily.    . ONE TOUCH ULTRA TEST test strip 1 each by Other route 2 (two) times daily.   0  . potassium chloride SA (K-DUR,KLOR-CON) 20 MEQ tablet Take 1 tablet (20 mEq total) by mouth as needed. TAKE ONLY IF TALKING LASIX 90 tablet 1  . spironolactone (ALDACTONE) 25 MG tablet take 1/2 tablet by mouth once daily 45 tablet 1   No current facility-administered medications for this visit.    Allergies:  Tetracycline   Social History: The patient  reports that he has never smoked. He has never used smokeless tobacco. He reports that he does not drink alcohol or use drugs.   ROS:  Please see the history of present illness. Otherwise, complete review of systems is positive for hearing loss.  All other systems are reviewed and negative.   Physical Exam: VS:  BP (!) 144/68   Pulse 69   Ht 5\' 8"  (1.727 m)   Wt 158 lb (71.7 kg)   BMI 24.02 kg/m , BMI Body mass index is 24.02 kg/m.  Wt Readings from Last 3 Encounters:  03/17/16 158 lb (71.7 kg)  02/20/16 161 lb 9.6 oz (73.3 kg)  11/14/15 157 lb (71.2 kg)    General: Elderly male, appears comfortable at rest. HEENT: Conjunctiva and lids normal, oropharynx clear. Neck: Supple, no elevated JVP or carotid bruits, no thyromegaly. Lungs: Clear to auscultation, nonlabored breathing at rest. Cardiac: Regular rate and rhythm, no S3. Abdomen: Soft, nontender, bowel sounds present. Extremities: No pitting edema, distal pulses 2+. Skin: Warm and dry. Musculoskeletal: Mild  kyphosis. Neuropsychiatric: Alert and oriented 3, affect appropriate.  ECG: I personally reviewed the tracing from 11/14/2015 which showed sinus bradycardia with prolonged PR interval, right bundle branch block, left anterior fascicular block, and decreased R wave progression.  Recent Labwork: 08/05/2015: BUN 43; Creatinine, Ser 1.99; Potassium 4.2; Sodium 139     Component Value Date/Time   CHOL 111 08/05/2015 1448   TRIG 156 (H) 08/05/2015 1448   HDL 31 (L) 08/05/2015 1448   CHOLHDL 3.6 08/05/2015 1448   VLDL 31 08/05/2015 1448   LDLCALC 49 08/05/2015 1448    Other Studies Reviewed Today:  Echocardiogram 09/10/2015: Study  Conclusions  - Left ventricle: The cavity size was normal. Wall thickness was   increased in a pattern of mild LVH. Systolic function was   moderately to severely reduced. The estimated ejection fraction   was in the range of 30% to 35%. There is akinesis of the   inferolateral and inferior myocardium. Doppler parameters are   consistent with abnormal left ventricular relaxation (grade 1   diastolic dysfunction). - Aortic valve: Mildly calcified annulus. Trileaflet; moderately   calcified leaflets. Cusp separation was reduced. There was mild   stenosis. There was trivial regurgitation. Mean gradient (S): 8   mm Hg. Peak gradient (S): 15 mm Hg. VTI ratio of LVOT to aortic   valve: 0.37. Valve area (VTI): 1.53 cm^2. Valve area (Vmax): 1.38   cm^2. Valve area (Vmean): 1.56 cm^2. - Mitral valve: Calcified annulus. There was mild regurgitation. - Left atrium: The atrium was moderately dilated. - Right ventricle: Pacer wire or catheter noted in right ventricle.   Systolic function was mildly reduced. - Atrial septum: No defect or patent foramen ovale was identified. - Tricuspid valve: There was trivial regurgitation. - Pulmonary arteries: PA peak pressure: 29 mm Hg (S). - Pericardium, extracardiac: There was no pericardial effusion.  Impressions:  - Mild  LVH with LVEF 30-35%. Inferior/inferolateral akinesis   consistent with ischemic cardiomyopathy. Grade 1 diastolic   dysfunction with intermediate LV filling pressure. Moderate left   atrial enlargement. MAC with mild mitral regurgitation. Mild   aortic stenosis (some parameters suggest mild to moderate low   gradient aortic stenosis) with trivial aortic regurgitation.   Mildly reduced RV contraction with device wire present. Trivial   tricuspid regurgitation with PASP estimated 29 mmHg.  Assessment and Plan:  1. Ischemic cardiomyopathy, LVEF 30-35% range.  2. Chronic combined heart failure, weight is down after temporary use of Lasix. He has not been on standing diuretic. Medications reviewed and otherwise stable. He has follow-up in the Heart Failure clinic in the next few months.  3. CKD stage 3, last creatinine 1.9. He follows with Dr. Woody Seller. Has been able to stay on Cozaar. Have not pushed for Entresto.  4. St. Jude ICD in place, followed by [name]. No device discharges.  5. CAD status post CABG with known graft disease, stable without angina symptoms. Remains on aspirin and statin.  Current medicines were reviewed with the patient today.  Disposition: Follow-up with me in 4 months.  Signed, Satira Sark, MD, [facility] 03/17/2016 3:32 PM    [facility], [place], [place] [address] Phone: [phone]; Fax: [phone]

## 2016-03-25 DIAGNOSIS — I429 Cardiomyopathy, unspecified: Secondary | ICD-10-CM | POA: Diagnosis not present

## 2016-03-25 DIAGNOSIS — I509 Heart failure, unspecified: Secondary | ICD-10-CM | POA: Diagnosis not present

## 2016-03-25 DIAGNOSIS — E1122 Type 2 diabetes mellitus with diabetic chronic kidney disease: Secondary | ICD-10-CM | POA: Diagnosis not present

## 2016-03-25 DIAGNOSIS — Z6825 Body mass index (BMI) 25.0-25.9, adult: Secondary | ICD-10-CM | POA: Diagnosis not present

## 2016-03-25 DIAGNOSIS — J329 Chronic sinusitis, unspecified: Secondary | ICD-10-CM | POA: Diagnosis not present

## 2016-03-25 DIAGNOSIS — Z789 Other specified health status: Secondary | ICD-10-CM | POA: Diagnosis not present

## 2016-03-25 DIAGNOSIS — R5383 Other fatigue: Secondary | ICD-10-CM | POA: Diagnosis not present

## 2016-03-25 DIAGNOSIS — Z299 Encounter for prophylactic measures, unspecified: Secondary | ICD-10-CM | POA: Diagnosis not present

## 2016-03-26 DIAGNOSIS — I509 Heart failure, unspecified: Secondary | ICD-10-CM | POA: Diagnosis not present

## 2016-03-26 DIAGNOSIS — Z6825 Body mass index (BMI) 25.0-25.9, adult: Secondary | ICD-10-CM | POA: Diagnosis not present

## 2016-03-26 DIAGNOSIS — Z299 Encounter for prophylactic measures, unspecified: Secondary | ICD-10-CM | POA: Diagnosis not present

## 2016-03-26 DIAGNOSIS — N189 Chronic kidney disease, unspecified: Secondary | ICD-10-CM | POA: Diagnosis not present

## 2016-03-26 DIAGNOSIS — N179 Acute kidney failure, unspecified: Secondary | ICD-10-CM | POA: Diagnosis not present

## 2016-03-29 DIAGNOSIS — Z5181 Encounter for therapeutic drug level monitoring: Secondary | ICD-10-CM | POA: Diagnosis not present

## 2016-03-29 DIAGNOSIS — Z79899 Other long term (current) drug therapy: Secondary | ICD-10-CM | POA: Diagnosis not present

## 2016-04-14 DIAGNOSIS — N183 Chronic kidney disease, stage 3 (moderate): Secondary | ICD-10-CM | POA: Diagnosis not present

## 2016-04-14 DIAGNOSIS — I429 Cardiomyopathy, unspecified: Secondary | ICD-10-CM | POA: Diagnosis not present

## 2016-04-14 DIAGNOSIS — E119 Type 2 diabetes mellitus without complications: Secondary | ICD-10-CM | POA: Diagnosis not present

## 2016-04-15 ENCOUNTER — Other Ambulatory Visit (HOSPITAL_COMMUNITY): Payer: Self-pay | Admitting: Cardiology

## 2016-04-16 ENCOUNTER — Ambulatory Visit (INDEPENDENT_AMBULATORY_CARE_PROVIDER_SITE_OTHER): Payer: Medicare Other

## 2016-04-16 DIAGNOSIS — I5022 Chronic systolic (congestive) heart failure: Secondary | ICD-10-CM | POA: Diagnosis not present

## 2016-04-16 DIAGNOSIS — E119 Type 2 diabetes mellitus without complications: Secondary | ICD-10-CM | POA: Diagnosis not present

## 2016-04-16 DIAGNOSIS — Z959 Presence of cardiac and vascular implant and graft, unspecified: Secondary | ICD-10-CM | POA: Diagnosis not present

## 2016-04-16 DIAGNOSIS — Z79899 Other long term (current) drug therapy: Secondary | ICD-10-CM | POA: Diagnosis not present

## 2016-04-16 DIAGNOSIS — I2510 Atherosclerotic heart disease of native coronary artery without angina pectoris: Secondary | ICD-10-CM | POA: Diagnosis not present

## 2016-04-16 DIAGNOSIS — E7800 Pure hypercholesterolemia, unspecified: Secondary | ICD-10-CM | POA: Diagnosis not present

## 2016-04-16 DIAGNOSIS — I10 Essential (primary) hypertension: Secondary | ICD-10-CM | POA: Diagnosis not present

## 2016-04-16 NOTE — Progress Notes (Signed)
EPIC Encounter for ICM Monitoring  Patient Name: Clayton Morton is a 83 y.o. male Date: 04/16/2016 Primary Care Physican: Glenda Chroman, MD Primary Cardiologist: [facility] Electrophysiologist: Allred Dry Weight:    153 lbs        Heart Failure questions reviewed, pt asymptomatic but had a cold during the time of decreased impedance.   Thoracic impedance normal but was abnormal suggesting fluid accumulation from 03/28/2016 to 04/08/2016.  Recommendations: No changes. Reminded to limit dietary salt intake to 2000 mg/day and fluid intake to < 2 liters/day. Encouraged to call for fluid symptoms.  Follow-up plan: ICM clinic phone appointment on 05/17/2016.  Copy of ICM check sent to device physician.   3 month ICM trend: 04/16/2016   1 Year ICM trend:      Rosalene Billings, RN 04/16/2016 8:42 AM

## 2016-04-20 ENCOUNTER — Ambulatory Visit (HOSPITAL_COMMUNITY)
Admission: RE | Admit: 2016-04-20 | Discharge: 2016-04-20 | Disposition: A | Discharge status: Home / Self Care | Payer: Medicare Other | Source: Ambulatory Visit | Attending: Cardiology | Admitting: Cardiology

## 2016-04-20 ENCOUNTER — Encounter (HOSPITAL_COMMUNITY): Payer: Self-pay

## 2016-04-20 VITALS — BP 126/63 | HR 61 | Wt 161.8 lb

## 2016-04-20 DIAGNOSIS — I5022 Chronic systolic (congestive) heart failure: Secondary | ICD-10-CM | POA: Diagnosis not present

## 2016-04-20 DIAGNOSIS — I2510 Atherosclerotic heart disease of native coronary artery without angina pectoris: Secondary | ICD-10-CM | POA: Diagnosis not present

## 2016-04-20 LAB — BASIC METABOLIC PANEL
Anion gap: 6 (ref 5–15)
BUN: 45 mg/dL — ABNORMAL HIGH (ref 6–20)
CO2: 27 mmol/L (ref 22–32)
Calcium: 9.5 mg/dL (ref 8.9–10.3)
Chloride: 107 mmol/L (ref 101–111)
Creatinine, Ser: 2.51 mg/dL — ABNORMAL HIGH (ref 0.61–1.24)
GFR calc Af Amer: 26 mL/min — ABNORMAL LOW (ref >60)
GFR calc non Af Amer: 22 mL/min — ABNORMAL LOW (ref >60)
Glucose, Bld: 176 mg/dL — ABNORMAL HIGH (ref 65–99)
Potassium: 4.5 mmol/L (ref 3.5–5.1)
Sodium: 140 mmol/L (ref 135–145)

## 2016-04-20 LAB — BRAIN NATRIURETIC PEPTIDE: B Natriuretic Peptide: 589.6 pg/mL — ABNORMAL HIGH (ref 0.0–100.0)

## 2016-04-20 NOTE — Patient Instructions (Signed)
Labs today  We will contact you in 6 months to schedule your next appointment.  

## 2016-04-21 NOTE — Progress Notes (Signed)
Patient ID: Clayton Morton, male   DOB: 1932/09/24, 83 y.o.   MRN: [id number] PCP: Dr. Woody Seller Primary Cardiologist: [name]  HPI: Clayton Morton is an 83 yo with a history of ICM, chronic systolic HF, HTN, CVA 8338, DM2, HLD and CAD s/p CABG 2005.  Scheduled for L/RHC on 11/29/13 and was admitted with low output and severe CAD. Repeat RHC showed low filling pressures and normal CO.   Echo (11/2013): EF 10%, RV mod dilated and systolic fx severely reduced Echo (02/26/14): EF 30-35% inferior AK mild RV dysfunction Echo (6/16): EF 25-30%, mild to moderate LV dilation, regional WMAs, mild to moderate Clayton, moderately decreased RV systolic function, severe LAE.  Echo (6/17): EF 30-35%, inferior/inferolateral akinesis, mild aortic stenosis.   Losartan switched to [facility] but unable to tolerate due to severe hypotension. Switched back to losartan. He saw Dr Caryl Comes for consideration of ICD.  This was decided against for the time being given evidence for some LV functional recovery (EF 10% => 30%).  Dr Caryl Comes started him on Coreg. There was also concern for frequent PVCs and possible contribution of PVCs to his cardiomyopathy.  Holter monitor was done in 6/16 and showed only about 1% PVCs.    Today, Clayton Morton is doing well.  No chest pain, no exertional dyspnea.  No orthopnea or PND.  He is taking Lasix once or twce a week based on weight.  No lightheadedness or falls. Weight has been stable at home (takes Lasix when > 153 lbs).  Corevue reviewed: Stable impedance, not suggestive of volume overload.    Labs:  12/10/13: K+ 4.5, BUN 31, creatinine 1.46 12/18/13 (PCP office): K+ 4.0, creatinine 1.74 12/24/13 K+ 4.1, creatinine 1.51, pro-BNP 5332 02/05/14 K+ 3.9 creatinine 1.83 12/15 K+ 3.9 creatinine 1.71 => 1.86, digoxin 0.5 5/16: K 4.3, creatinine 1.61, LDL 57, HDL 37 8/16: K 3.6, creatinine 1.67 6/17: K 4.3, creatinine 1.78  ECG: NSR, RBBB, 1st degree AV block.   ROS: All systems negative except as listed  in HPI, PMH and Problem List.  SH:  Social History   Social History  . Marital status: Married    Spouse name: N/A  . Number of children: N/A  . Years of education: N/A   Occupational History  . Not on file.   Social History Main Topics  . Smoking status: Never Smoker  . Smokeless tobacco: Never Used  . Alcohol use No  . Drug use: No  . Sexual activity: Not on file   Other Topics Concern  . 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 Lives in [place] with wife.     FH:  Family History  Problem Relation Age of Onset  . Hypertension Father     CAD, CHF; deceased at 52  . Dementia Mother     deceased at age 68    Past Medical History:  Diagnosis Date  . Age-related macular degeneration, wet, right eye ([place])   . AICD (automatic cardioverter/defibrillator) present 10/30/2014  . Cerebrovascular accident [facility])    Right subcortical infarct 2006  . Chronic systolic congestive heart failure ([place])    a) ECHO (02/2012): EF 30-35%, no obvious thrombus b) ECHO (11/2013): EF 10%, AV mildly calcified, mild Clayton, RV mod dialted and sys fx sev reduced, TAPSE 0.8, lateral annulus peak S velocity 5.4 c) RHC (11/29/13): RA 15, RV: 65/19, PA 49%, CO/CI: 3.25 / 1.7 d) RHC (12/03/13): RA 1, RV  25/1/2, PA 27/10 (16), PCWP 6, Fick CO/CI: 5.3 / 2.9, PVR 1.9 WU, PA sat 72 and 74%   . Coronary atherosclerosis of native coronary artery    1) CABG 2005 2) LHC (910/15): patent SVG to PDA, occlusion of SVG sequence to PLA, patency of LIMA to LAD and patency of SVG to diagonal with sev stenosis at coronary anastomotic site  . Diabetes mellitus, type 2 ([place])   . Essential hypertension, benign   . History of gout   . Hyperlipidemia   . Ischemic cardiomyopathy   . Kidney stones   . Macular degeneration    Dr. [name]  . Non-ST elevation MI (NSTEMI) ([place])    2005  . Varicose veins     Current Outpatient Prescriptions  Medication Sig Dispense Refill  . ALPRAZolam  (XANAX) 0.25 MG tablet Take 0.5 tablets by mouth daily.  0  . aspirin 81 MG tablet Take 81 mg by mouth daily.    [name] atorvastatin (LIPITOR) 80 MG tablet take 1 tablet by mouth once daily 90 tablet 1  . carvedilol (COREG) 12.5 MG tablet take 1 tablet by mouth twice a day with food 60 tablet 6  . furosemide (LASIX) 40 MG tablet take 1 tablet by mouth once daily 90 tablet 1  . glipiZIDE (GLUCOTROL XL) 5 MG 24 hr tablet Take 1 tablet by mouth 2 (two) times daily.  1  . hydrALAZINE (APRESOLINE) 50 MG tablet take 1 and 1/2 tablets by mouth three times a day 405 tablet 1  . isosorbide mononitrate (IMDUR) 60 MG 24 hr tablet Take 1 tablet (60 mg total) by mouth daily. 90 tablet 1  . levothyroxine (SYNTHROID, LEVOTHROID) 50 MCG tablet Take 50 mcg by mouth every morning.     [name] losartan (COZAAR) 50 MG tablet Take 1 tab by mouth every morning & 1/2 tab every evening (75mg  total for the day) 135 tablet 3  . Omega-3 Fatty Acids (FISH OIL) 1000 MG CAPS Take 1 capsule by mouth 2 (two) times daily.    . ONE TOUCH ULTRA TEST test strip 1 each by Other route 2 (two) times daily.   0  . potassium chloride SA (K-DUR,KLOR-CON) 20 MEQ tablet Take 1 tablet (20 mEq total) by mouth as needed. TAKE ONLY IF TALKING LASIX 90 tablet 1  . spironolactone (ALDACTONE) 25 MG tablet take 1/2 tablet by mouth once daily 45 tablet 1   No current facility-administered medications for this encounter.     Vitals:   04/20/16 1511  BP: 126/63  Pulse: 61  SpO2: 100%  Weight: 161 lb 12 oz (73.4 kg)    PHYSICAL EXAM: General:  Elderly well appearing. No resp difficulty, wife present  HEENT: normal Neck: supple. JVP flat. Carotids 2+ bilaterally; no bruits. No lymphadenopathy or thryomegaly appreciated. Cor: PMI normal. SB & regular rhythm. No rubs, gallops or murmurs. Lungs: clear Abdomen: soft, nontender, nondistended. No hepatosplenomegaly. No bruits or masses. Good bowel sounds. Extremities: no cyanosis, clubbing, rash,  edema Neuro: alert & orientedx3, cranial nerves grossly intact. Moves all 4 extremities w/o difficulty. Affect pleasant.  ASSESSMENT & PLAN:  1) Chronic systolic HF: Ischemic cardiomyopathy.  Most recent echo in 6/17 showed persistently low EF, 30-35%.  St Jude ICD. NYHA II symptoms and volume status stable by exam and Corevue. - Will continue lasix 40 mg PRN. If weight greater than 153 lbs take 40 mg, if weight less than 153 lbs hold lasix. Would like weight about 151-153 lbs. - Continue Coreg  12.5 mg bid.  - Continue losartan 50 mg qam, 25 mg qpm (he was unable to tolerate Entresto in the past due to hypotension); continue spironolactone 12.5 daily.  BMET today.  - Continue digoxin 0.125 mg daily, check level.   - Not enough PVCs by holter monitoring to play role in cardiomyopathy.   2) CAD s/p CABG:  Last LHC (11/2013) severe native CAD, occlusion of SVG sequence to PLV. Continue statin and ASA 81 daily.  3) HTN:  BP not elevated.  4) CKD, stage III:  BMET today.   We will see him in 6 months, alternate visits with [name].    Loralie Champagne  04/21/2016

## 2016-04-26 ENCOUNTER — Encounter (HOSPITAL_COMMUNITY): Payer: Self-pay | Admitting: *Deleted

## 2016-04-26 ENCOUNTER — Telehealth (HOSPITAL_COMMUNITY): Payer: Self-pay | Admitting: *Deleted

## 2016-04-26 NOTE — Telephone Encounter (Signed)
Notes Recorded by Kennieth Rad, RN on 04/26/2016 at 2:52 PM EST Called again and left VM. Letter mailed today. ------  Notes Recorded by Harvie Junior, [place] on 04/23/2016 at 2:38 PM EST Left vm for pt to call for lab results ------  Notes Recorded by Harvie Junior, CMA on 04/21/2016 at 2:09 PM EST Left vm for pt to call for lab results. ------  Notes Recorded by Larey Dresser, MD on 04/20/2016 at 10:09 PM EST Higher creatinine, decrease losartan to 25 mg daily and repeat BMET 10 days.

## 2016-04-29 ENCOUNTER — Other Ambulatory Visit: Payer: Self-pay | Admitting: Internal Medicine

## 2016-05-05 DIAGNOSIS — Z79899 Other long term (current) drug therapy: Secondary | ICD-10-CM | POA: Diagnosis not present

## 2016-05-10 DIAGNOSIS — E1122 Type 2 diabetes mellitus with diabetic chronic kidney disease: Secondary | ICD-10-CM | POA: Diagnosis not present

## 2016-05-11 ENCOUNTER — Telehealth (HOSPITAL_COMMUNITY): Payer: Self-pay | Admitting: *Deleted

## 2016-05-11 DIAGNOSIS — D631 Anemia in chronic kidney disease: Secondary | ICD-10-CM | POA: Diagnosis not present

## 2016-05-11 DIAGNOSIS — D649 Anemia, unspecified: Secondary | ICD-10-CM | POA: Diagnosis not present

## 2016-05-11 DIAGNOSIS — Z951 Presence of aortocoronary bypass graft: Secondary | ICD-10-CM | POA: Diagnosis not present

## 2016-05-11 DIAGNOSIS — Z883 Allergy status to other anti-infective agents status: Secondary | ICD-10-CM | POA: Diagnosis not present

## 2016-05-11 DIAGNOSIS — Z881 Allergy status to other antibiotic agents status: Secondary | ICD-10-CM | POA: Diagnosis not present

## 2016-05-11 DIAGNOSIS — I517 Cardiomegaly: Secondary | ICD-10-CM | POA: Diagnosis not present

## 2016-05-11 DIAGNOSIS — I429 Cardiomyopathy, unspecified: Secondary | ICD-10-CM | POA: Diagnosis not present

## 2016-05-11 DIAGNOSIS — E7800 Pure hypercholesterolemia, unspecified: Secondary | ICD-10-CM | POA: Diagnosis not present

## 2016-05-11 DIAGNOSIS — I10 Essential (primary) hypertension: Secondary | ICD-10-CM | POA: Diagnosis not present

## 2016-05-11 DIAGNOSIS — N179 Acute kidney failure, unspecified: Secondary | ICD-10-CM | POA: Diagnosis not present

## 2016-05-11 DIAGNOSIS — E039 Hypothyroidism, unspecified: Secondary | ICD-10-CM | POA: Diagnosis not present

## 2016-05-11 DIAGNOSIS — Z888 Allergy status to other drugs, medicaments and biological substances status: Secondary | ICD-10-CM | POA: Diagnosis not present

## 2016-05-11 DIAGNOSIS — N184 Chronic kidney disease, stage 4 (severe): Secondary | ICD-10-CM | POA: Diagnosis not present

## 2016-05-11 DIAGNOSIS — I2510 Atherosclerotic heart disease of native coronary artery without angina pectoris: Secondary | ICD-10-CM | POA: Diagnosis not present

## 2016-05-11 DIAGNOSIS — I5022 Chronic systolic (congestive) heart failure: Secondary | ICD-10-CM | POA: Diagnosis not present

## 2016-05-11 DIAGNOSIS — Z8673 Personal history of transient ischemic attack (TIA), and cerebral infarction without residual deficits: Secondary | ICD-10-CM | POA: Diagnosis not present

## 2016-05-11 DIAGNOSIS — Z886 Allergy status to analgesic agent status: Secondary | ICD-10-CM | POA: Diagnosis not present

## 2016-05-11 DIAGNOSIS — R809 Proteinuria, unspecified: Secondary | ICD-10-CM | POA: Diagnosis not present

## 2016-05-11 DIAGNOSIS — Z95810 Presence of automatic (implantable) cardiac defibrillator: Secondary | ICD-10-CM | POA: Diagnosis not present

## 2016-05-11 DIAGNOSIS — I252 Old myocardial infarction: Secondary | ICD-10-CM | POA: Diagnosis not present

## 2016-05-11 DIAGNOSIS — R7989 Other specified abnormal findings of blood chemistry: Secondary | ICD-10-CM | POA: Diagnosis not present

## 2016-05-11 DIAGNOSIS — I509 Heart failure, unspecified: Secondary | ICD-10-CM | POA: Diagnosis not present

## 2016-05-11 DIAGNOSIS — N183 Chronic kidney disease, stage 3 (moderate): Secondary | ICD-10-CM | POA: Diagnosis not present

## 2016-05-11 DIAGNOSIS — E1122 Type 2 diabetes mellitus with diabetic chronic kidney disease: Secondary | ICD-10-CM | POA: Diagnosis not present

## 2016-05-11 DIAGNOSIS — F419 Anxiety disorder, unspecified: Secondary | ICD-10-CM | POA: Diagnosis not present

## 2016-05-11 DIAGNOSIS — I130 Hypertensive heart and chronic kidney disease with heart failure and stage 1 through stage 4 chronic kidney disease, or unspecified chronic kidney disease: Secondary | ICD-10-CM | POA: Diagnosis not present

## 2016-05-11 NOTE — Telephone Encounter (Signed)
Pt's wife called to let us know pt has been having trouble w/kidney function.  She states pcp had held pt's Lasix for about 5 days and he went back there today for repeat labs which showed bun 50 CR 2.41 NA 145.   She states pt's wt was up about 4 lbs and they admitted him to the hospital at [facility].  She just wanted [place] to be aware of what all was going on, advised they can call us when he is discharged for a f/u appt.

## 2016-05-12 DIAGNOSIS — D649 Anemia, unspecified: Secondary | ICD-10-CM | POA: Diagnosis not present

## 2016-05-12 DIAGNOSIS — R7989 Other specified abnormal findings of blood chemistry: Secondary | ICD-10-CM | POA: Diagnosis not present

## 2016-05-12 DIAGNOSIS — I5022 Chronic systolic (congestive) heart failure: Secondary | ICD-10-CM | POA: Diagnosis not present

## 2016-05-12 DIAGNOSIS — D631 Anemia in chronic kidney disease: Secondary | ICD-10-CM | POA: Diagnosis not present

## 2016-05-12 DIAGNOSIS — N179 Acute kidney failure, unspecified: Secondary | ICD-10-CM | POA: Diagnosis not present

## 2016-05-12 DIAGNOSIS — I130 Hypertensive heart and chronic kidney disease with heart failure and stage 1 through stage 4 chronic kidney disease, or unspecified chronic kidney disease: Secondary | ICD-10-CM | POA: Diagnosis not present

## 2016-05-12 DIAGNOSIS — E1122 Type 2 diabetes mellitus with diabetic chronic kidney disease: Secondary | ICD-10-CM | POA: Diagnosis not present

## 2016-05-12 DIAGNOSIS — N183 Chronic kidney disease, stage 3 (moderate): Secondary | ICD-10-CM | POA: Diagnosis not present

## 2016-05-12 DIAGNOSIS — I509 Heart failure, unspecified: Secondary | ICD-10-CM | POA: Diagnosis not present

## 2016-05-12 DIAGNOSIS — I10 Essential (primary) hypertension: Secondary | ICD-10-CM | POA: Diagnosis not present

## 2016-05-12 DIAGNOSIS — N184 Chronic kidney disease, stage 4 (severe): Secondary | ICD-10-CM | POA: Diagnosis not present

## 2016-05-14 ENCOUNTER — Other Ambulatory Visit (HOSPITAL_COMMUNITY): Payer: Self-pay | Admitting: Internal Medicine

## 2016-05-17 ENCOUNTER — Ambulatory Visit (INDEPENDENT_AMBULATORY_CARE_PROVIDER_SITE_OTHER): Payer: Medicare Other

## 2016-05-17 DIAGNOSIS — I5022 Chronic systolic (congestive) heart failure: Secondary | ICD-10-CM | POA: Diagnosis not present

## 2016-05-17 DIAGNOSIS — Z959 Presence of cardiac and vascular implant and graft, unspecified: Secondary | ICD-10-CM

## 2016-05-18 NOTE — Progress Notes (Signed)
EPIC Encounter for ICM Monitoring  Patient Name: Clayton Morton is a 83 y.o. male Date: 05/18/2016 Primary Care Physican: Glenda Chroman, MD Primary Cardiologist: [facility] Electrophysiologist: Allred Dry Weight:157lbs               Heart Failure questions reviewed, pt reported increase weight in the last few weeks.  Today 157 lbs today but baseline 153 lbs.  He weighs daily at same time with same clothes.  He says he never experiences fluid symptoms such as SOB or swelling.    Thoracic impedance normal but was abnormal suggesting fluid accumulation from 04/30/2016 to 05/14/2016. He was hospitalized at [facility] for decreased kidney function and discharged 05/13/2016.  Prescribed and confirmed dosage:  Dr Woody Seller instructed patient at time of discharge 2/22 to take Furosemide 40 mg daily and Potassium 20 mEq 1 tablet as needed.     Recommendations: Recommended to make hospital follow up appointment with Dr Haroldine Laws and Dr Domenic Polite if needed.  He has appointment with Dr Woody Seller tomorrow.   Follow-up plan: ICM clinic phone appointment on 05/25/2016 repeat fluid levels.  Copy of ICM check sent to primary cardiologist and device physician.   3 month ICM trend: 05/17/2016   1 Year ICM trend:      Rosalene Billings, RN 05/18/2016 11:52 AM

## 2016-05-19 DIAGNOSIS — Z6826 Body mass index (BMI) 26.0-26.9, adult: Secondary | ICD-10-CM | POA: Diagnosis not present

## 2016-05-19 DIAGNOSIS — Z713 Dietary counseling and surveillance: Secondary | ICD-10-CM | POA: Diagnosis not present

## 2016-05-19 DIAGNOSIS — F419 Anxiety disorder, unspecified: Secondary | ICD-10-CM | POA: Diagnosis not present

## 2016-05-19 DIAGNOSIS — I429 Cardiomyopathy, unspecified: Secondary | ICD-10-CM | POA: Diagnosis not present

## 2016-05-19 DIAGNOSIS — I509 Heart failure, unspecified: Secondary | ICD-10-CM | POA: Diagnosis not present

## 2016-05-19 DIAGNOSIS — E1122 Type 2 diabetes mellitus with diabetic chronic kidney disease: Secondary | ICD-10-CM | POA: Diagnosis not present

## 2016-05-19 DIAGNOSIS — Z299 Encounter for prophylactic measures, unspecified: Secondary | ICD-10-CM | POA: Diagnosis not present

## 2016-05-24 ENCOUNTER — Telehealth: Payer: Self-pay | Admitting: Cardiology

## 2016-05-24 ENCOUNTER — Ambulatory Visit (INDEPENDENT_AMBULATORY_CARE_PROVIDER_SITE_OTHER): Payer: Medicare Other | Admitting: *Deleted

## 2016-05-24 ENCOUNTER — Other Ambulatory Visit (HOSPITAL_COMMUNITY): Payer: Self-pay | Admitting: Internal Medicine

## 2016-05-24 DIAGNOSIS — I255 Ischemic cardiomyopathy: Secondary | ICD-10-CM | POA: Diagnosis not present

## 2016-05-24 DIAGNOSIS — I509 Heart failure, unspecified: Secondary | ICD-10-CM

## 2016-05-24 NOTE — Telephone Encounter (Signed)
Confirmed remote transmission w/ pt wife.   

## 2016-05-25 ENCOUNTER — Ambulatory Visit (INDEPENDENT_AMBULATORY_CARE_PROVIDER_SITE_OTHER): Payer: Medicare Other

## 2016-05-25 ENCOUNTER — Encounter: Payer: Self-pay | Admitting: Cardiology

## 2016-05-25 DIAGNOSIS — Z959 Presence of cardiac and vascular implant and graft, unspecified: Secondary | ICD-10-CM

## 2016-05-25 DIAGNOSIS — I5022 Chronic systolic (congestive) heart failure: Secondary | ICD-10-CM

## 2016-05-25 LAB — CUP PACEART REMOTE DEVICE CHECK
Battery Remaining Longevity: 69 mo
Battery Remaining Percentage: 73 %
Battery Voltage: 2.96 V
Brady Statistic AP VP Percent: 1.1 %
Brady Statistic AP VS Percent: 7.2 %
Brady Statistic AS VP Percent: 1 %
Brady Statistic AS VS Percent: 90 %
Brady Statistic RA Percent Paced: 6.3 %
Brady Statistic RV Percent Paced: 1.2 %
Date Time Interrogation Session: 20180305193557
HighPow Impedance: 59 Ohm
HighPow Impedance: 59 Ohm
Implantable Lead Implant Date: 20160810
Implantable Lead Implant Date: 20160810
Implantable Lead Location: 753859
Implantable Lead Location: 753860
Implantable Pulse Generator Implant Date: 20160810
Lead Channel Impedance Value: 310 Ohm
Lead Channel Impedance Value: 410 Ohm
Lead Channel Pacing Threshold Amplitude: 1 V
Lead Channel Pacing Threshold Amplitude: 1.125 V
Lead Channel Pacing Threshold Pulse Width: 0.5 ms
Lead Channel Pacing Threshold Pulse Width: 0.5 ms
Lead Channel Sensing Intrinsic Amplitude: 1.5 mV
Lead Channel Sensing Intrinsic Amplitude: 8.7 mV
Lead Channel Setting Pacing Amplitude: 2.125
Lead Channel Setting Pacing Amplitude: 2.5 V
Lead Channel Setting Pacing Pulse Width: 0.5 ms
Lead Channel Setting Sensing Sensitivity: 0.5 mV
Pulse Gen Serial Number: 7179751

## 2016-05-25 NOTE — Progress Notes (Signed)
Remote ICD transmission.   

## 2016-05-25 NOTE — Progress Notes (Signed)
EPIC Encounter for ICM Monitoring  Patient Name: Clayton Morton is a 83 y.o. male Date: 05/25/2016 Primary Care Physican: Glenda Chroman, MD Primary Cardiologist: [facility]/Bensimhon Electrophysiologist: Allred Dry Weight:153lbs        Heart Failure questions reviewed, pt asymptomatic.  Baseline weight 153 lbs.  Was hospitalized at [facility] for decreased kidney function and discharged 05/13/2016.   Thoracic impedance returned to normal.  Prescribed Furosemide 40 mg daily and Potassium 20 mEq 1 tablet as needed  Labs: 05/05/2016 Creatinine 2.41, BUN 50, Potassium 4.4, Sodium 145, EGFR 24-28 04/20/2016 Creatinine 2.51, BUN 45, Potassium 4.5, Sodium 140, EGFR 22-26  08/05/2015 Creatinine 1.99, BUN 43, Potassium 4.2, Sodium 139, EGFR 30-35   Recommendations: No changes. Reminded to limit dietary salt intake to 2000 mg/day and fluid intake to < 2 liters/day. Encouraged to call for fluid symptoms.  Follow-up plan: ICM clinic phone appointment on 06/18/2016.  Copy of ICM check sent to device physician.   3 month ICM trend: 05/24/2016   1 Year ICM trend:      Rosalene Billings, RN 05/25/2016 9:29 AM

## 2016-05-27 ENCOUNTER — Other Ambulatory Visit: Payer: Self-pay | Admitting: Internal Medicine

## 2016-06-18 ENCOUNTER — Ambulatory Visit (INDEPENDENT_AMBULATORY_CARE_PROVIDER_SITE_OTHER): Payer: Medicare Other

## 2016-06-18 DIAGNOSIS — Z959 Presence of cardiac and vascular implant and graft, unspecified: Secondary | ICD-10-CM

## 2016-06-18 DIAGNOSIS — I5022 Chronic systolic (congestive) heart failure: Secondary | ICD-10-CM

## 2016-06-18 NOTE — Progress Notes (Signed)
EPIC Encounter for ICM Monitoring  Patient Name: Clayton Morton is a 83 y.o. male Date: 06/18/2016 Primary Care Physican: Glenda Chroman, MD Primary Cardiologist: [facility]/Bensimhon Electrophysiologist: Allred Dry Weight:153lbs         Spoke with wife.  Heart Failure questions reviewed, pt asymptomatic.   Thoracic impedance normal.  Prescribed Furosemide 40 mg daily and Potassium 20 mEq 1 tablet as needed only if taking Lasix  Labs: 05/05/2016 Creatinine 2.41, BUN 50, Potassium 4.4, Sodium 145, EGFR 24-28 04/20/2016 Creatinine 2.51, BUN 45, Potassium 4.5, Sodium 140, EGFR 22-26  08/05/2015 Creatinine 1.99, BUN 43, Potassium 4.2, Sodium 139, EGFR 30-35   Recommendations: No changes. Reminded to limit dietary salt intake to 2000 mg/day and fluid intake to < 2 liters/day. Encouraged to call for fluid symptoms.  Follow-up plan: ICM clinic phone appointment on 07/19/2016.  Copy of ICM check sent to device physician.   3 month ICM trend: 06/18/2016   1 Year ICM trend:      Rosalene Billings, RN 06/18/2016 9:20 AM

## 2016-06-24 DIAGNOSIS — H353122 Nonexudative age-related macular degeneration, left eye, intermediate dry stage: Secondary | ICD-10-CM | POA: Diagnosis not present

## 2016-06-24 DIAGNOSIS — H353213 Exudative age-related macular degeneration, right eye, with inactive scar: Secondary | ICD-10-CM | POA: Diagnosis not present

## 2016-06-24 DIAGNOSIS — H353114 Nonexudative age-related macular degeneration, right eye, advanced atrophic with subfoveal involvement: Secondary | ICD-10-CM | POA: Diagnosis not present

## 2016-07-08 DIAGNOSIS — E119 Type 2 diabetes mellitus without complications: Secondary | ICD-10-CM | POA: Diagnosis not present

## 2016-07-08 DIAGNOSIS — I2510 Atherosclerotic heart disease of native coronary artery without angina pectoris: Secondary | ICD-10-CM | POA: Diagnosis not present

## 2016-07-08 DIAGNOSIS — I10 Essential (primary) hypertension: Secondary | ICD-10-CM | POA: Diagnosis not present

## 2016-07-08 DIAGNOSIS — E7800 Pure hypercholesterolemia, unspecified: Secondary | ICD-10-CM | POA: Diagnosis not present

## 2016-07-16 NOTE — Progress Notes (Signed)
Cardiology Office Note  Date: 07/19/2016   ID: Clayton Morton, DOB May 19, 1932, MRN [id number]  PCP: Glenda Chroman, MD  Primary Cardiologist: Rozann Lesches, MD   Chief Complaint  Patient presents with  . Cardiomyopathy    History of Present Illness: Clayton Morton is an 83 y.o. male last seen in December 2017. He had an interval follow-up visit in the CHF clinic in January, clinically stable at that time. He is here with his son today. His wife is in the hospital at this time. Other than being under increased stress, he is not reporting any new symptoms. No chest pain, palpitations, or progressive shortness of breath.  He continues to follow in the device clinic with [name], [employer] ICD in place. Last thoracic impedance check was normal.  Current cardiac regimen includes aspirin, Lipitor, Coreg, Lasix, hydralazine, Imdur, Cozaar, Aldactone, and potassium supplements. He has not had to take any additional Lasix recently. His weight is down a few pounds from January.  Past Medical History:  Diagnosis Date  . Age-related macular degeneration, wet, right eye ([place])   . AICD (automatic cardioverter/defibrillator) present 10/30/2014  . Cerebrovascular accident [facility])    Right subcortical infarct 2006  . Chronic systolic congestive heart failure ([place])    a) ECHO (02/2012): EF 30-35%, no obvious thrombus b) ECHO (11/2013): EF 10%, AV mildly calcified, mild MR, RV mod dialted and sys fx sev reduced, TAPSE 0.8, lateral annulus peak S velocity 5.4 c) RHC (11/29/13): RA 15, RV: 65/19, PA 49%, CO/CI: 3.25 / 1.7 d) RHC (12/03/13): RA 1, RV 25/1/2, PA 27/10 (16), PCWP 6, Fick CO/CI: 5.3 / 2.9, PVR 1.9 WU, PA sat 72 and 74%   . Coronary atherosclerosis of native coronary artery    1) CABG 2005 2) LHC (910/15): patent SVG to PDA, occlusion of SVG sequence to PLA, patency of LIMA to LAD and patency of SVG to diagonal with sev stenosis at coronary anastomotic site  . Diabetes mellitus, type 2 ([place])   .  Essential hypertension, benign   . History of gout   . Hyperlipidemia   . Ischemic cardiomyopathy   . Kidney stones   . Macular degeneration    Dr. [name]  . Non-ST elevation MI (NSTEMI) ([place])    2005  . Varicose veins     Past Surgical History:  Procedure Laterality Date  . CARDIAC CATHETERIZATION    . CARDIAC DEFIBRILLATOR PLACEMENT  10/30/2014  . CATARACT EXTRACTION W/ INTRAOCULAR LENS  IMPLANT, BILATERAL Bilateral ~ 1991  . CORONARY ANGIOPLASTY    . CORONARY ARTERY BYPASS GRAFT  2005   LIMA to LAD, SVG diagonal, SVG to PDA and PLA  . CYSTOSCOPY W/ LITHOLAPAXY / EHL    . EP IMPLANTABLE DEVICE N/A 10/30/2014   Procedure: ICD Implant;  Surgeon: Deboraha Sprang, MD, SJM Ellipse DR implanted for primary prevention  . LEFT AND RIGHT HEART CATHETERIZATION WITH CORONARY/GRAFT ANGIOGRAM N/A 11/29/2013   Procedure: LEFT AND RIGHT HEART CATHETERIZATION WITH Beatrix Fetters;  Surgeon: Blane Ohara, MD;  Location: [facility] CATH LAB;  Service: Cardiovascular;  Laterality: N/A;  . RIGHT HEART CATHETERIZATION N/A 12/03/2013   Procedure: RIGHT HEART CATH;  Surgeon: Jolaine Artist, MD;  Location: [facility] CATH LAB;  Service: Cardiovascular;  Laterality: N/A;  . TONSILLECTOMY      Current Outpatient Prescriptions  Medication Sig Dispense Refill  . ALPRAZolam (XANAX) 0.25 MG tablet Take 0.5 tablets by mouth daily.  0  . aspirin 81  MG tablet Take 81 mg by mouth daily.    [name] atorvastatin (LIPITOR) 80 MG tablet take 1 tablet by mouth once daily 90 tablet 3  . carvedilol (COREG) 12.5 MG tablet take 1 tablet by mouth twice a day with food 60 tablet 6  . furosemide (LASIX) 40 MG tablet take 1 tablet by mouth once daily 90 tablet 1  . glipiZIDE (GLUCOTROL XL) 5 MG 24 hr tablet Take 1 tablet by mouth 2 (two) times daily.  1  . hydrALAZINE (APRESOLINE) 50 MG tablet take 1 and 1/2 tablets by mouth three times a day 405 tablet 1  . isosorbide mononitrate (IMDUR) 60 MG 24 hr tablet take 1 tablet by mouth  once daily 90 tablet 1  . levothyroxine (SYNTHROID, LEVOTHROID) 50 MCG tablet Take 50 mcg by mouth every morning.     [name] losartan (COZAAR) 50 MG tablet Take 1 tab by mouth every morning & 1/2 tab every evening (44m total for the day) 135 tablet 3  . Omega-3 Fatty Acids (FISH OIL) 1000 MG CAPS Take 1 capsule by mouth 2 (two) times daily.    . ONE TOUCH ULTRA TEST test strip 1 each by Other route 2 (two) times daily.   0  . potassium chloride SA (K-DUR,KLOR-CON) 20 MEQ tablet Take 1 tablet (20 mEq total) by mouth as needed. TAKE ONLY IF TALKING LASIX 90 tablet 1  . spironolactone (ALDACTONE) 25 MG tablet take 1/2 tablet by mouth once daily 45 tablet 1   No current facility-administered medications for this visit.    Allergies:  Tetracycline   Social History: The patient  reports that he has never smoked. He has never used smokeless tobacco. He reports that he does not drink alcohol or use drugs.   ROS:  Please see the history of present illness. Otherwise, complete review of systems is positive for hearing loss.  All other systems are reviewed and negative.   Physical Exam: VS:  BP 118/61   Pulse 60   Ht _0  (1.727 m)   Wt 159 lb 9.6 oz (72.4 kg)   SpO2 98%   BMI 24.27 kg/m , BMI Body mass index is 24.27 kg/m.  Wt Readings from Last 3 Encounters:  07/19/16 159 lb 9.6 oz (72.4 kg)  04/20/16 161 lb 12 oz (73.4 kg)  03/17/16 158 lb (71.7 kg)    General: Elderly male, appears comfortable at rest. HEENT: Conjunctiva and lids normal, oropharynx clear. Neck: Supple, no elevated JVP or carotid bruits, no thyromegaly. Lungs: Clear to auscultation, nonlabored breathing at rest. Cardiac: Regular rate and rhythm, no S3. Abdomen: Soft, nontender, bowel sounds present. Extremities: No pitting edema, distal pulses 2+. Skin: Warm and dry. Musculoskeletal: Mild kyphosis. Neuropsychiatric: Alert and oriented 3, affect appropriate.  ECG: I personally reviewed the tracing from 11/14/2015 which  showed sinus rhythm with prolonged PR interval, left anterior fascicular block, and right bundle-branch block.  Recent Labwork: 04/20/2016: B Natriuretic Peptide 589.6; BUN 45; Creatinine, Ser 2.51; Potassium 4.5; Sodium 140     Component Value Date/Time   CHOL 111 08/05/2015 1448   TRIG 156 (H) 08/05/2015 1448   HDL 31 (L) 08/05/2015 1448   CHOLHDL 3.6 08/05/2015 1448   VLDL 31 08/05/2015 1448   LDLCALC 49 08/05/2015 1448    Other Studies Reviewed Today:  Echocardiogram 09/10/2015: Study Conclusions  - Left ventricle: The cavity size was normal. Wall thickness was   increased in a pattern of mild LVH. Systolic function was  moderately to severely reduced. The estimated ejection fraction   was in the range of 30% to 35%. There is akinesis of the   inferolateral and inferior myocardium. Doppler parameters are   consistent with abnormal left ventricular relaxation (grade 1   diastolic dysfunction). - Aortic valve: Mildly calcified annulus. Trileaflet; moderately   calcified leaflets. Cusp separation was reduced. There was mild   stenosis. There was trivial regurgitation. Mean gradient (S): 8   mm Hg. Peak gradient (S): 15 mm Hg. VTI ratio of LVOT to aortic   valve: 0.37. Valve area (VTI): 1.53 cm^2. Valve area (Vmax): 1.38   cm^2. Valve area (Vmean): 1.56 cm^2. - Mitral valve: Calcified annulus. There was mild regurgitation. - Left atrium: The atrium was moderately dilated. - Right ventricle: Pacer wire or catheter noted in right ventricle.   Systolic function was mildly reduced. - Atrial septum: No defect or patent foramen ovale was identified. - Tricuspid valve: There was trivial regurgitation. - Pulmonary arteries: PA peak pressure: 29 mm Hg (S). - Pericardium, extracardiac: There was no pericardial effusion.  Impressions:  - Mild LVH with LVEF 30-35%. Inferior/inferolateral akinesis   consistent with ischemic cardiomyopathy. Grade 1 diastolic   dysfunction with  intermediate LV filling pressure. Moderate left   atrial enlargement. MAC with mild mitral regurgitation. Mild   aortic stenosis (some parameters suggest mild to moderate low   gradient aortic stenosis) with trivial aortic regurgitation.   Mildly reduced RV contraction with device wire present. Trivial   tricuspid regurgitation with PASP estimated 29 mmHg.  Assessment and Plan:  1. Chronic systolic heart failure with ischemic myopathy and LVEF 30-35%. He is euvolemic today on examination, no changes made present regimen. Keep scheduled follow-up in the CHF clinic.  2. CKD, stage 3 followed by Dr. Woody Seller. Last creatinine 2.4, potassium 4.4.  3. CAD status post CABG with documented graft disease but stable angina on medical therapy. Continue observation on statin and aspirin.  4. St. Jude ICD in place, no device shocks or syncope.  Current medicines were reviewed with the patient today.  Disposition: Follow-up in 4 months.  Signed, Satira Sark, MD, [facility] 07/19/2016 2:19 PM    [facility] at [place], [place], [place] [address] Phone: [phone]; Fax: [phone]

## 2016-07-17 ENCOUNTER — Other Ambulatory Visit (HOSPITAL_COMMUNITY): Payer: Self-pay | Admitting: Cardiology

## 2016-07-17 DIAGNOSIS — I509 Heart failure, unspecified: Secondary | ICD-10-CM

## 2016-07-19 ENCOUNTER — Ambulatory Visit (INDEPENDENT_AMBULATORY_CARE_PROVIDER_SITE_OTHER): Payer: Medicare Other | Admitting: Cardiology

## 2016-07-19 ENCOUNTER — Ambulatory Visit (INDEPENDENT_AMBULATORY_CARE_PROVIDER_SITE_OTHER): Payer: Medicare Other

## 2016-07-19 ENCOUNTER — Encounter: Payer: Self-pay | Admitting: Cardiology

## 2016-07-19 ENCOUNTER — Telehealth: Payer: Self-pay

## 2016-07-19 VITALS — BP 118/61 | HR 60 | Ht 68.0 in | Wt 159.6 lb

## 2016-07-19 DIAGNOSIS — Z95810 Presence of automatic (implantable) cardiac defibrillator: Secondary | ICD-10-CM | POA: Diagnosis not present

## 2016-07-19 DIAGNOSIS — Z959 Presence of cardiac and vascular implant and graft, unspecified: Secondary | ICD-10-CM

## 2016-07-19 DIAGNOSIS — N183 Chronic kidney disease, stage 3 unspecified: Secondary | ICD-10-CM

## 2016-07-19 DIAGNOSIS — I255 Ischemic cardiomyopathy: Secondary | ICD-10-CM

## 2016-07-19 DIAGNOSIS — I5022 Chronic systolic (congestive) heart failure: Secondary | ICD-10-CM

## 2016-07-19 DIAGNOSIS — I2589 Other forms of chronic ischemic heart disease: Secondary | ICD-10-CM | POA: Diagnosis not present

## 2016-07-19 NOTE — Progress Notes (Signed)
EPIC Encounter for ICM Monitoring  Patient Name: Clayton Morton is a 83 y.o. male Date: 07/19/2016 Primary Care Physican: Dhruv B Vyas, MD Primary Cardiologist: [facility]/Bensimhon Electrophysiologist: Allred Dry Weight:Last known weight 153lbs       Attempted call to patient and unable to reach.  Left detailed message regarding transmission.  Transmission reviewed.   Most recent hospitalization 04/2016.   Thoracic impedance normal.  Prescribed Furosemide 40 mg daily and Potassium 20 mEq 1 tablet as needed only if taking Lasix  Labs: 05/05/2016 Creatinine 2.41, BUN 50, Potassium 4.4, Sodium 145, EGFR 24-28 04/20/2016 Creatinine 2.51, BUN 45, Potassium 4.5, Sodium 140, EGFR 22-26  05/16/2017Creatinine 1.99, BUN 43, Potassium 4.2, Sodium 139, EGFR 30-35   Recommendations: Left voice mail with ICM number and encouraged to call for fluid symptoms.  Follow-up plan: ICM clinic phone appointment on 08/23/2016.  Office appointment scheduled today, 07/19/2016 with Dr McDowell.  Copy of ICM check sent to primary cardiologist and device physician.   3 month ICM trend: 07/19/2016   1 Year ICM trend:      Laurie S Short, RN 07/19/2016 8:40 AM    

## 2016-07-19 NOTE — Patient Instructions (Signed)
Medication Instructions:  Your physician recommends that you continue on your current medications as directed. Please refer to the Current Medication list given to you today.  Labwork: NONE  Testing/Procedures: NONE  Follow-Up: Your physician wants you to follow-up in: [place]. [place]. You will receive a reminder letter in the mail two months in advance. If you don't receive a letter, please call our office to schedule the follow-up appointment.  Any Other Special Instructions Will Be Listed Below (If Applicable).  If you need a refill on your cardiac medications before your next appointment, please call your pharmacy.

## 2016-07-19 NOTE — Telephone Encounter (Signed)
Remote ICM transmission received.  Attempted patient call and left detailed message regarding transmission and next ICM scheduled for 08/23/2016.  Advised to return call for any fluid symptoms or questions.

## 2016-08-11 DIAGNOSIS — I10 Essential (primary) hypertension: Secondary | ICD-10-CM | POA: Diagnosis not present

## 2016-08-11 DIAGNOSIS — E119 Type 2 diabetes mellitus without complications: Secondary | ICD-10-CM | POA: Diagnosis not present

## 2016-08-11 DIAGNOSIS — I2510 Atherosclerotic heart disease of native coronary artery without angina pectoris: Secondary | ICD-10-CM | POA: Diagnosis not present

## 2016-08-11 DIAGNOSIS — E7800 Pure hypercholesterolemia, unspecified: Secondary | ICD-10-CM | POA: Diagnosis not present

## 2016-08-23 ENCOUNTER — Ambulatory Visit (INDEPENDENT_AMBULATORY_CARE_PROVIDER_SITE_OTHER): Payer: Medicare Other | Admitting: *Deleted

## 2016-08-23 DIAGNOSIS — I5022 Chronic systolic (congestive) heart failure: Secondary | ICD-10-CM

## 2016-08-23 DIAGNOSIS — Z95810 Presence of automatic (implantable) cardiac defibrillator: Secondary | ICD-10-CM

## 2016-08-23 DIAGNOSIS — I255 Ischemic cardiomyopathy: Secondary | ICD-10-CM

## 2016-08-23 NOTE — Progress Notes (Signed)
EPIC Encounter for ICM Monitoring  Patient Name: Clayton Morton is a 83 y.o. male Date: 08/23/2016 Primary Care Physican: Glenda Chroman, MD Primary Cardiologist: [facility]/Bensimhon Electrophysiologist: Allred Dry Weight:Last known weight 153lbs        Attempted call to patient and unable to reach.  Left detailed message regarding transmission.  Transmission reviewed.    Thoracic impedance normal.  Prescribed Furosemide 40 mg daily and Potassium 20 mEq 1 tablet as needed only if taking Lasix  Labs: 05/05/2016 Creatinine 2.41, BUN 50, Potassium 4.4, Sodium 145, EGFR 24-28 04/20/2016 Creatinine 2.51, BUN 45, Potassium 4.5, Sodium 140, EGFR 22-26  05/16/2017Creatinine 1.99, BUN 43, Potassium 4.2, Sodium 139, EGFR 30-35   Recommendations: Left voice mail with ICM number and encouraged to call for fluid symptoms.  Follow-up plan: ICM clinic phone appointment on 09/23/2016.  Office appointment scheduled on 11/09/2016 with [name].  Copy of ICM check sent to device physician.   3 month ICM trend: 08/23/2016   1 Year ICM trend:      Rosalene Billings, RN 08/23/2016 2:31 PM

## 2016-08-23 NOTE — Progress Notes (Signed)
Remote ICD transmission.   

## 2016-08-25 LAB — CUP PACEART REMOTE DEVICE CHECK
Battery Remaining Longevity: 67 mo
Battery Remaining Percentage: 72 %
Battery Voltage: 2.96 V
Brady Statistic AP VP Percent: 1.8 %
Brady Statistic AP VS Percent: 9.8 %
Brady Statistic AS VP Percent: 1 %
Brady Statistic AS VS Percent: 87 %
Brady Statistic RA Percent Paced: 9.4 %
Brady Statistic RV Percent Paced: 1.9 %
Date Time Interrogation Session: 20180604060017
HighPow Impedance: 56 Ohm
HighPow Impedance: 56 Ohm
Implantable Lead Implant Date: 20160810
Implantable Lead Implant Date: 20160810
Implantable Lead Location: 753859
Implantable Lead Location: 753860
Implantable Pulse Generator Implant Date: 20160810
Lead Channel Impedance Value: 310 Ohm
Lead Channel Impedance Value: 410 Ohm
Lead Channel Pacing Threshold Amplitude: 1 V
Lead Channel Pacing Threshold Amplitude: 1.125 V
Lead Channel Pacing Threshold Pulse Width: 0.5 ms
Lead Channel Pacing Threshold Pulse Width: 0.5 ms
Lead Channel Sensing Intrinsic Amplitude: 1.1 mV
Lead Channel Sensing Intrinsic Amplitude: 9.6 mV
Lead Channel Setting Pacing Amplitude: 2.125
Lead Channel Setting Pacing Amplitude: 2.5 V
Lead Channel Setting Pacing Pulse Width: 0.5 ms
Lead Channel Setting Sensing Sensitivity: 0.5 mV
Pulse Gen Serial Number: 7179751

## 2016-08-31 ENCOUNTER — Encounter: Payer: Self-pay | Admitting: Cardiology

## 2016-08-31 DIAGNOSIS — L570 Actinic keratosis: Secondary | ICD-10-CM | POA: Diagnosis not present

## 2016-08-31 DIAGNOSIS — Z299 Encounter for prophylactic measures, unspecified: Secondary | ICD-10-CM | POA: Diagnosis not present

## 2016-08-31 DIAGNOSIS — D485 Neoplasm of uncertain behavior of skin: Secondary | ICD-10-CM | POA: Diagnosis not present

## 2016-09-07 DIAGNOSIS — I10 Essential (primary) hypertension: Secondary | ICD-10-CM | POA: Diagnosis not present

## 2016-09-07 DIAGNOSIS — E119 Type 2 diabetes mellitus without complications: Secondary | ICD-10-CM | POA: Diagnosis not present

## 2016-09-07 DIAGNOSIS — E7800 Pure hypercholesterolemia, unspecified: Secondary | ICD-10-CM | POA: Diagnosis not present

## 2016-09-07 DIAGNOSIS — I2510 Atherosclerotic heart disease of native coronary artery without angina pectoris: Secondary | ICD-10-CM | POA: Diagnosis not present

## 2016-09-11 ENCOUNTER — Other Ambulatory Visit: Payer: Self-pay | Admitting: Internal Medicine

## 2016-09-13 ENCOUNTER — Other Ambulatory Visit: Payer: Self-pay | Admitting: Internal Medicine

## 2016-09-14 DIAGNOSIS — Z7189 Other specified counseling: Secondary | ICD-10-CM | POA: Diagnosis not present

## 2016-09-14 DIAGNOSIS — F419 Anxiety disorder, unspecified: Secondary | ICD-10-CM | POA: Diagnosis not present

## 2016-09-14 DIAGNOSIS — E7800 Pure hypercholesterolemia, unspecified: Secondary | ICD-10-CM | POA: Diagnosis not present

## 2016-09-14 DIAGNOSIS — E1165 Type 2 diabetes mellitus with hyperglycemia: Secondary | ICD-10-CM | POA: Diagnosis not present

## 2016-09-14 DIAGNOSIS — Z299 Encounter for prophylactic measures, unspecified: Secondary | ICD-10-CM | POA: Diagnosis not present

## 2016-09-14 DIAGNOSIS — Z6826 Body mass index (BMI) 26.0-26.9, adult: Secondary | ICD-10-CM | POA: Diagnosis not present

## 2016-09-14 DIAGNOSIS — I2510 Atherosclerotic heart disease of native coronary artery without angina pectoris: Secondary | ICD-10-CM | POA: Diagnosis not present

## 2016-09-14 DIAGNOSIS — I10 Essential (primary) hypertension: Secondary | ICD-10-CM | POA: Diagnosis not present

## 2016-09-14 DIAGNOSIS — R5383 Other fatigue: Secondary | ICD-10-CM | POA: Diagnosis not present

## 2016-09-14 DIAGNOSIS — Z1389 Encounter for screening for other disorder: Secondary | ICD-10-CM | POA: Diagnosis not present

## 2016-09-14 DIAGNOSIS — E039 Hypothyroidism, unspecified: Secondary | ICD-10-CM | POA: Diagnosis not present

## 2016-09-14 DIAGNOSIS — Z0000 Encounter for general adult medical examination without abnormal findings: Secondary | ICD-10-CM | POA: Diagnosis not present

## 2016-09-16 DIAGNOSIS — Z125 Encounter for screening for malignant neoplasm of prostate: Secondary | ICD-10-CM | POA: Diagnosis not present

## 2016-09-16 DIAGNOSIS — Z79899 Other long term (current) drug therapy: Secondary | ICD-10-CM | POA: Diagnosis not present

## 2016-09-16 DIAGNOSIS — R5383 Other fatigue: Secondary | ICD-10-CM | POA: Diagnosis not present

## 2016-09-16 DIAGNOSIS — E039 Hypothyroidism, unspecified: Secondary | ICD-10-CM | POA: Diagnosis not present

## 2016-09-16 DIAGNOSIS — E7800 Pure hypercholesterolemia, unspecified: Secondary | ICD-10-CM | POA: Diagnosis not present

## 2016-09-20 DIAGNOSIS — L570 Actinic keratosis: Secondary | ICD-10-CM | POA: Diagnosis not present

## 2016-09-20 DIAGNOSIS — Z6826 Body mass index (BMI) 26.0-26.9, adult: Secondary | ICD-10-CM | POA: Diagnosis not present

## 2016-09-20 DIAGNOSIS — Z299 Encounter for prophylactic measures, unspecified: Secondary | ICD-10-CM | POA: Diagnosis not present

## 2016-09-23 ENCOUNTER — Telehealth: Payer: Self-pay

## 2016-09-23 ENCOUNTER — Ambulatory Visit (INDEPENDENT_AMBULATORY_CARE_PROVIDER_SITE_OTHER): Payer: Medicare Other

## 2016-09-23 DIAGNOSIS — Z95810 Presence of automatic (implantable) cardiac defibrillator: Secondary | ICD-10-CM

## 2016-09-23 DIAGNOSIS — I5022 Chronic systolic (congestive) heart failure: Secondary | ICD-10-CM | POA: Diagnosis not present

## 2016-09-23 NOTE — Progress Notes (Signed)
EPIC Encounter for ICM Monitoring  Patient Name: Clayton Morton is a 83 y.o. male Date: 09/23/2016 Primary Care Physican: Glenda Chroman, MD Primary Cardiologist: [facility]/Bensimhon Electrophysiologist: Allred Dry Weight:Last known weight 153lbs       Attempted call to patient and unable to reach.  Left detailed message regarding transmission.  Transmission reviewed.    Thoracic impedance normal but was abnormal suggesting fluid accumulation from 09/05/2016 to 09/16/2016.  Prescribed Furosemide 40 mg 1 tablet daily and Potassium 20 mEq 1 tablet as needed only if taking Lasix  Labs: 05/05/2016 Creatinine 2.41, BUN 50, Potassium 4.4, Sodium 145, EGFR 24-28 04/20/2016 Creatinine 2.51, BUN 45, Potassium 4.5, Sodium 140, EGFR 22-26  05/16/2017Creatinine 1.99, BUN 43, Potassium 4.2, Sodium 139, EGFR 30-35   Recommendations: Left voice mail with ICM number and encouraged to call for fluid symptoms.  Follow-up plan: ICM clinic phone appointment on 10/26/2016.    Copy of ICM check sent to device physician.   3 month ICM trend: 09/23/2016   1 Year ICM trend:      Rosalene Billings, RN 09/23/2016 9:14 AM

## 2016-09-23 NOTE — Telephone Encounter (Signed)
Remote ICM transmission received.  Attempted patient call and left detailed message regarding transmission and next ICM scheduled for 10/26/2016.  Advised to return call for any fluid symptoms or questions.

## 2016-09-28 ENCOUNTER — Other Ambulatory Visit (HOSPITAL_COMMUNITY): Payer: Self-pay | Admitting: Cardiology

## 2016-09-28 DIAGNOSIS — I509 Heart failure, unspecified: Secondary | ICD-10-CM

## 2016-09-29 ENCOUNTER — Other Ambulatory Visit: Payer: Self-pay

## 2016-09-30 ENCOUNTER — Other Ambulatory Visit: Payer: Self-pay | Admitting: Cardiology

## 2016-10-12 ENCOUNTER — Other Ambulatory Visit (HOSPITAL_COMMUNITY): Payer: Self-pay | Admitting: Internal Medicine

## 2016-10-26 ENCOUNTER — Ambulatory Visit (INDEPENDENT_AMBULATORY_CARE_PROVIDER_SITE_OTHER): Payer: Medicare Other

## 2016-10-26 DIAGNOSIS — Z95810 Presence of automatic (implantable) cardiac defibrillator: Secondary | ICD-10-CM

## 2016-10-26 DIAGNOSIS — I5022 Chronic systolic (congestive) heart failure: Secondary | ICD-10-CM

## 2016-10-26 NOTE — Progress Notes (Signed)
EPIC Encounter for ICM Monitoring  Patient Name: Clayton Morton is a 83 y.o. male Date: 10/26/2016 Primary Care Physican: Glenda Chroman, MD Primary Cardiologist: [facility]/Bensimhon Electrophysiologist: Allred Dry Weight: 155lbs       Heart Failure questions reviewed, pt asymptomatic.   Thoracic impedance normal.  Prescribed dosage: Furosemide 40 mg 1 tablet daily and Potassium 20 mEq 1 tablet as needed only if taking Lasix  Labs: 05/05/2016 Creatinine 2.41, BUN 50, Potassium 4.4, Sodium 145, EGFR 24-28 04/20/2016 Creatinine 2.51, BUN 45, Potassium 4.5, Sodium 140, EGFR 22-26  05/16/2017Creatinine 1.99, BUN 43, Potassium 4.2, Sodium 139, EGFR 30-35   Recommendations: No changes.   Encouraged to call for fluid symptoms.  Follow-up plan: ICM clinic phone appointment on 11/26/2016.  Office appointment scheduled 11/15/2016 with [name].  Copy of ICM check sent to device physician.   3 month ICM trend: 10/26/2016   1 Year ICM trend:      Rosalene Billings, RN 10/26/2016 8:47 AM

## 2016-11-03 DIAGNOSIS — I2510 Atherosclerotic heart disease of native coronary artery without angina pectoris: Secondary | ICD-10-CM | POA: Diagnosis not present

## 2016-11-03 DIAGNOSIS — E119 Type 2 diabetes mellitus without complications: Secondary | ICD-10-CM | POA: Diagnosis not present

## 2016-11-03 DIAGNOSIS — E7800 Pure hypercholesterolemia, unspecified: Secondary | ICD-10-CM | POA: Diagnosis not present

## 2016-11-03 DIAGNOSIS — I10 Essential (primary) hypertension: Secondary | ICD-10-CM | POA: Diagnosis not present

## 2016-11-08 NOTE — Progress Notes (Signed)
Cardiology Office Note  Date: 11/09/2016   ID: KARDELL [name], DOB 02-14-1933, MRN [id number]  PCP: Glenda Chroman, MD  Primary Cardiologist: Rozann Lesches, MD   Chief Complaint  Patient presents with  . Cardiomyopathy    History of Present Illness: Clayton Morton is an 83 y.o. male last seen in April. He presents for a routine follow-up visit. States that he feels about the same in terms of dyspnea on exertion, has had no chest pain or palpitations. He has been under a lot of stress, his wife has been in poor health and he states that she has been getting worse recently. She does have home health and PT, but he does a lot to take care of her as well.  He continues to follow with [name] in the device clinic, [place] ICD in place. Recent thoracic impedance check was normal.  Reviewed his medications. Cardiac regimen includes aspirin, Coreg, Lipitor, Lasix, hydralazine, Imdur, Cozaar, Aldactone, and potassium supplements. We discussed obtaining a follow-up BME T.  I personally reviewed his ECG today which shows sinus rhythm with prolonged PR interval, right bundle branch block, and left anterior fascicular block.  Past Medical History:  Diagnosis Date  . Age-related macular degeneration, wet, right eye ([place])   . AICD (automatic cardioverter/defibrillator) present 10/30/2014  . Cerebrovascular accident [facility])    Right subcortical infarct 2006  . Chronic systolic congestive heart failure ([place])    a) ECHO (02/2012): EF 30-35%, no obvious thrombus b) ECHO (11/2013): EF 10%, AV mildly calcified, mild MR, RV mod dialted and sys fx sev reduced, TAPSE 0.8, lateral annulus peak S velocity 5.4 c) RHC (11/29/13): RA 15, RV: 65/19, PA 49%, CO/CI: 3.25 / 1.7 d) RHC (12/03/13): RA 1, RV 25/1/2, PA 27/10 (16), PCWP 6, Fick CO/CI: 5.3 / 2.9, PVR 1.9 WU, PA sat 72 and 74%   . Coronary atherosclerosis of native coronary artery    1) CABG 2005 2) LHC (910/15): patent SVG to PDA, occlusion of SVG  sequence to PLA, patency of LIMA to LAD and patency of SVG to diagonal with sev stenosis at coronary anastomotic site  . Diabetes mellitus, type 2 ([place])   . Essential hypertension, benign   . History of gout   . Hyperlipidemia   . Ischemic cardiomyopathy   . Kidney stones   . Macular degeneration    Dr. [name]  . Non-ST elevation MI (NSTEMI) ([place])    2005  . Varicose veins     Past Surgical History:  Procedure Laterality Date  . CARDIAC CATHETERIZATION    . CARDIAC DEFIBRILLATOR PLACEMENT  10/30/2014  . CATARACT EXTRACTION W/ INTRAOCULAR LENS  IMPLANT, BILATERAL Bilateral ~ 1991  . CORONARY ANGIOPLASTY    . CORONARY ARTERY BYPASS GRAFT  2005   LIMA to LAD, SVG diagonal, SVG to PDA and PLA  . CYSTOSCOPY W/ LITHOLAPAXY / EHL    . EP IMPLANTABLE DEVICE N/A 10/30/2014   Procedure: ICD Implant;  Surgeon: Deboraha Sprang, MD, SJM Ellipse DR implanted for primary prevention  . LEFT AND RIGHT HEART CATHETERIZATION WITH CORONARY/GRAFT ANGIOGRAM N/A 11/29/2013   Procedure: LEFT AND RIGHT HEART CATHETERIZATION WITH Beatrix Fetters;  Surgeon: Blane Ohara, MD;  Location: [facility] CATH LAB;  Service: Cardiovascular;  Laterality: N/A;  . RIGHT HEART CATHETERIZATION N/A 12/03/2013   Procedure: RIGHT HEART CATH;  Surgeon: Jolaine Artist, MD;  Location: [facility] CATH LAB;  Service: Cardiovascular;  Laterality: N/A;  . TONSILLECTOMY  Current Outpatient Prescriptions  Medication Sig Dispense Refill  . ALPRAZolam (XANAX) 0.25 MG tablet Take 0.5 tablets by mouth daily.  0  . aspirin 81 MG tablet Take 81 mg by mouth daily.    [name] atorvastatin (LIPITOR) 80 MG tablet take 1 tablet by mouth once daily 90 tablet 3  . carvedilol (COREG) 12.5 MG tablet take 1 tablet by mouth twice a day with food 60 tablet 6  . furosemide (LASIX) 40 MG tablet take 1 tablet by mouth once daily 90 tablet 1  . glipiZIDE (GLUCOTROL XL) 5 MG 24 hr tablet Take 1 tablet by mouth 2 (two) times daily.  1  . hydrALAZINE  (APRESOLINE) 50 MG tablet take 1 and 1/2 tablet by mouth three times a day 405 tablet 1  . isosorbide mononitrate (IMDUR) 60 MG 24 hr tablet take 1 tablet by mouth once daily 90 tablet 1  . levothyroxine (SYNTHROID, LEVOTHROID) 50 MCG tablet Take 50 mcg by mouth every morning.     [name] losartan (COZAAR) 50 MG tablet take 1 tablet by mouth every morning and 1/2 tablet by mouth every evening 135 tablet 3  . Omega-3 Fatty Acids (FISH OIL) 1000 MG CAPS Take 1 capsule by mouth 2 (two) times daily.    . ONE TOUCH ULTRA TEST test strip 1 each by Other route 2 (two) times daily.   0  . potassium chloride SA (K-DUR,KLOR-CON) 20 MEQ tablet Take 1 tablet (20 mEq total) by mouth as needed. TAKE ONLY IF TALKING LASIX 90 tablet 1  . spironolactone (ALDACTONE) 25 MG tablet take 1/2 tablet by mouth once daily 45 tablet 1   No current facility-administered medications for this visit.    Allergies:  Tetracycline   Social History: The patient  reports that he has never smoked. He has never used smokeless tobacco. He reports that he does not drink alcohol or use drugs.   ROS:  Please see the history of present illness. Otherwise, complete review of systems is positive for hearing loss.  All other systems are reviewed and negative.   Physical Exam: VS:  BP 132/64   Pulse 64   Ht _0  (1.727 m)   Wt 160 lb 12.8 oz (72.9 kg)   SpO2 98%   BMI 24.45 kg/m , BMI Body mass index is 24.45 kg/m.  Wt Readings from Last 3 Encounters:  11/09/16 160 lb 12.8 oz (72.9 kg)  07/19/16 159 lb 9.6 oz (72.4 kg)  04/20/16 161 lb 12 oz (73.4 kg)    General: Elderly male, appears comfortable at rest. HEENT: Conjunctiva and lids normal, oropharynx clear. Neck: Supple, no elevated JVP or carotid bruits, no thyromegaly. Lungs: Clear to auscultation, nonlabored breathing at rest. Cardiac: Regular rate and rhythm, no S3. Abdomen: Soft, nontender, bowel sounds present. Extremities: No pitting edema, distal pulses 2+. Skin: Warm  and dry. Musculoskeletal: Mild kyphosis. Neuropsychiatric: Alert and oriented 3, affect appropriate.  ECG: I personally reviewed the tracing from 11/14/2015 which showed sinus rhythm with prolonged PR interval, left anterior fascicular block, and right bundle-branch block.  Recent Labwork: 04/20/2016: B Natriuretic Peptide 589.6; BUN 45; Creatinine, Ser 2.51; Potassium 4.5; Sodium 140     Component Value Date/Time   CHOL 111 08/05/2015 1448   TRIG 156 (H) 08/05/2015 1448   HDL 31 (L) 08/05/2015 1448   CHOLHDL 3.6 08/05/2015 1448   VLDL 31 08/05/2015 1448   LDLCALC 49 08/05/2015 1448  February 2018: BUN 50, creatinine 2.41, potassium 4.23 August 2016: TSH  12.46, hemoglobin 10.9, platelet 161  Other Studies Reviewed Today:  Echocardiogram 09/10/2015: Study Conclusions  - Left ventricle: The cavity size was normal. Wall thickness was increased in a pattern of mild LVH. Systolic function was moderately to severely reduced. The estimated ejection fraction was in the range of 30% to 35%. There is akinesis of the inferolateral and inferior myocardium. Doppler parameters are consistent with abnormal left ventricular relaxation (grade 1 diastolic dysfunction). - Aortic valve: Mildly calcified annulus. Trileaflet; moderately calcified leaflets. Cusp separation was reduced. There was mild stenosis. There was trivial regurgitation. Mean gradient (S): 8 mm Hg. Peak gradient (S): 15 mm Hg. VTI ratio of LVOT to aortic valve: 0.37. Valve area (VTI): 1.53 cm^2. Valve area (Vmax): 1.38 cm^2. Valve area (Vmean): 1.56 cm^2. - Mitral valve: Calcified annulus. There was mild regurgitation. - Left atrium: The atrium was moderately dilated. - Right ventricle: Pacer wire or catheter noted in right ventricle. Systolic function was mildly reduced. - Atrial septum: No defect or patent foramen ovale was identified. - Tricuspid valve: There was trivial regurgitation. - Pulmonary  arteries: PA peak pressure: 29 mm Hg (S). - Pericardium, extracardiac: There was no pericardial effusion.  Impressions:  - Mild LVH with LVEF 30-35%. Inferior/inferolateral akinesis consistent with ischemic cardiomyopathy. Grade 1 diastolic dysfunction with intermediate LV filling pressure. Moderate left atrial enlargement. MAC with mild mitral regurgitation. Mild aortic stenosis (some parameters suggest mild to moderate low gradient aortic stenosis) with trivial aortic regurgitation. Mildly reduced RV contraction with device wire present. Trivial tricuspid regurgitation with PASP estimated 29 mmHg.  Assessment and Plan:  1. Chronic systolic heart failure, reports no increasing shortness of breath and his weight is stable. Recent thoracic impedance was normal.  2. Ischemic cardiomyopathy with LVEF 30-35%. Continue with current medical regimen. No change in diuretic dose although we will follow-up BMET with known renal disease.  3. CKD stage III, follow-up BMET. Last creatinine 2.4.  4. [employer] ICD in place, followed by [name]. No device shocks reported.  5. CAD status post CABG with graft disease but no progressive angina symptoms on current medical therapy.  Current medicines were reviewed with the patient today.   Orders Placed This Encounter  Procedures  . Basic Metabolic Panel (BMET)  . EKG 12-Lead    Disposition: Follow-up in 4 months.  Signed, Satira Sark, MD, [facility] 11/09/2016 2:45 PM    [facility] [place], [place], [place] [address] Phone: [phone]; Fax: [phone]

## 2016-11-09 ENCOUNTER — Ambulatory Visit (INDEPENDENT_AMBULATORY_CARE_PROVIDER_SITE_OTHER): Payer: Medicare Other | Admitting: Cardiology

## 2016-11-09 ENCOUNTER — Encounter: Payer: Self-pay | Admitting: Cardiology

## 2016-11-09 VITALS — BP 132/64 | HR 64 | Ht 68.0 in | Wt 160.8 lb

## 2016-11-09 DIAGNOSIS — N183 Chronic kidney disease, stage 3 unspecified: Secondary | ICD-10-CM

## 2016-11-09 DIAGNOSIS — I5022 Chronic systolic (congestive) heart failure: Secondary | ICD-10-CM | POA: Diagnosis not present

## 2016-11-09 DIAGNOSIS — Z95810 Presence of automatic (implantable) cardiac defibrillator: Secondary | ICD-10-CM | POA: Diagnosis not present

## 2016-11-09 DIAGNOSIS — I255 Ischemic cardiomyopathy: Secondary | ICD-10-CM

## 2016-11-09 DIAGNOSIS — I2510 Atherosclerotic heart disease of native coronary artery without angina pectoris: Secondary | ICD-10-CM

## 2016-11-09 NOTE — Patient Instructions (Signed)
Medication Instructions:  Your physician recommends that you continue on your current medications as directed. Please refer to the Current Medication list given to you today.   Labwork: BMET- Orders given today  Testing/Procedures: none  Follow-Up: Your physician wants you to follow-up in: 4 MONTHS WITH [name] You will receive a reminder letter in the mail two months in advance. If you don't receive a letter, please call our office to schedule the follow-up appointment.  Any Other Special Instructions Will Be Listed Below (If Applicable).  If you need a refill on your cardiac medications before your next appointment, please call your pharmacy.

## 2016-11-12 DIAGNOSIS — R001 Bradycardia, unspecified: Secondary | ICD-10-CM | POA: Diagnosis not present

## 2016-11-12 DIAGNOSIS — I5022 Chronic systolic (congestive) heart failure: Secondary | ICD-10-CM | POA: Diagnosis not present

## 2016-11-15 ENCOUNTER — Other Ambulatory Visit (HOSPITAL_COMMUNITY): Payer: Self-pay | Admitting: Cardiology

## 2016-11-16 ENCOUNTER — Telehealth: Payer: Self-pay

## 2016-11-16 MED ORDER — FUROSEMIDE 20 MG PO TABS: 20.0000 mg | ORAL_TABLET | Freq: Every day | ORAL | 0 refills | Status: DC

## 2016-11-16 NOTE — Telephone Encounter (Signed)
-----   Message from Acquanetta Chain, LPN sent at 6/43/5391  7:33 AM EDT -----   ----- Message ----- From: Satira Sark, MD Sent: 11/15/2016   8:47 AM To: Merlene Laughter, LPN  Results reviewed. Creatinine has bumped up from 2.4 to 2.8, potassium normal at 4.5. Cut Lasix to 40 mg alternating with 20 mg every other day, would also take one half potassium dose on the days that Lasix is used 20 mg daily. A copy of this test should be forwarded to Glenda Chroman, MD.

## 2016-11-16 NOTE — Telephone Encounter (Signed)
Patient notified. Routed to PCP. New prescription sent to pharmacy

## 2016-11-19 ENCOUNTER — Other Ambulatory Visit (HOSPITAL_COMMUNITY): Payer: Self-pay | Admitting: Internal Medicine

## 2016-11-26 ENCOUNTER — Ambulatory Visit (INDEPENDENT_AMBULATORY_CARE_PROVIDER_SITE_OTHER): Payer: Medicare Other | Admitting: *Deleted

## 2016-11-26 DIAGNOSIS — I255 Ischemic cardiomyopathy: Secondary | ICD-10-CM

## 2016-11-26 DIAGNOSIS — Z95810 Presence of automatic (implantable) cardiac defibrillator: Secondary | ICD-10-CM | POA: Diagnosis not present

## 2016-11-26 DIAGNOSIS — I5022 Chronic systolic (congestive) heart failure: Secondary | ICD-10-CM | POA: Diagnosis not present

## 2016-11-26 NOTE — Progress Notes (Signed)
EPIC Encounter for ICM Monitoring  Patient Name: Clayton Morton is a 83 y.o. male Date: 11/26/2016 Primary Care Physican: Glenda Chroman, MD Primary Cardiologist: McDowell/Bensimhon Electrophysiologist: Allred Dry Weight:154lbs         Heart Failure questions reviewed, pt asymptomatic.   Thoracic impedance normal but was abnormal suggesting fluid accumulation from 11/14/2016 to 11/20/2016 and 11/22/2016.  Prescribed dosage: Furosemide 40 mg alternating with 20 mg every other day(per 8/28 note).  Potassium 20 mEq take one tablet on the days that Lasix is used 20 mg daily (per 8/28 note).  Labs: 11/12/2016 Creatinine 2.80, BUN 79, Potassium 4.5, Sodium 147, EGFR 20-23 05/05/2016 Creatinine 2.41, BUN 50, Potassium 4.4, Sodium 145, EGFR 24-28 04/20/2016 Creatinine 2.51, BUN 45, Potassium 4.5, Sodium 140, EGFR 22-26  05/16/2017Creatinine 1.99, BUN 43, Potassium 4.2, Sodium 139, EGFR 30-35   Recommendations: No changes.    Encouraged to call for fluid symptoms.  Follow-up plan: ICM clinic phone appointment on 12/30/2016.    Copy of ICM check sent to [name].   3 month ICM trend: 11/26/2016   1 Year ICM trend:      Rosalene Billings, RN 11/26/2016 10:03 AM

## 2016-11-26 NOTE — Progress Notes (Signed)
Remote ICD transmission.   

## 2016-11-27 ENCOUNTER — Other Ambulatory Visit (HOSPITAL_COMMUNITY): Payer: Self-pay | Admitting: Internal Medicine

## 2016-11-27 DIAGNOSIS — I509 Heart failure, unspecified: Secondary | ICD-10-CM

## 2016-11-30 ENCOUNTER — Encounter: Payer: Self-pay | Admitting: Cardiology

## 2016-12-15 ENCOUNTER — Other Ambulatory Visit: Payer: Self-pay | Admitting: Cardiology

## 2016-12-17 DIAGNOSIS — I2510 Atherosclerotic heart disease of native coronary artery without angina pectoris: Secondary | ICD-10-CM | POA: Diagnosis not present

## 2016-12-17 DIAGNOSIS — E7800 Pure hypercholesterolemia, unspecified: Secondary | ICD-10-CM | POA: Diagnosis not present

## 2016-12-17 DIAGNOSIS — E119 Type 2 diabetes mellitus without complications: Secondary | ICD-10-CM | POA: Diagnosis not present

## 2016-12-17 DIAGNOSIS — I10 Essential (primary) hypertension: Secondary | ICD-10-CM | POA: Diagnosis not present

## 2016-12-17 LAB — CUP PACEART REMOTE DEVICE CHECK
Battery Remaining Longevity: 65 mo
Battery Remaining Percentage: 69 %
Battery Voltage: 2.96 V
Brady Statistic AP VP Percent: 1.7 %
Brady Statistic AP VS Percent: 9.9 %
Brady Statistic AS VP Percent: 1 %
Brady Statistic AS VS Percent: 87 %
Brady Statistic RA Percent Paced: 9.4 %
Brady Statistic RV Percent Paced: 1.7 %
Date Time Interrogation Session: 20180907060020
HighPow Impedance: 60 Ohm
HighPow Impedance: 60 Ohm
Implantable Lead Implant Date: 20160810
Implantable Lead Implant Date: 20160810
Implantable Lead Location: 753859
Implantable Lead Location: 753860
Implantable Pulse Generator Implant Date: 20160810
Lead Channel Impedance Value: 310 Ohm
Lead Channel Impedance Value: 410 Ohm
Lead Channel Pacing Threshold Amplitude: 1 V
Lead Channel Pacing Threshold Amplitude: 1 V
Lead Channel Pacing Threshold Pulse Width: 0.5 ms
Lead Channel Pacing Threshold Pulse Width: 0.5 ms
Lead Channel Sensing Intrinsic Amplitude: 2 mV
Lead Channel Sensing Intrinsic Amplitude: 8.2 mV
Lead Channel Setting Pacing Amplitude: 2 V
Lead Channel Setting Pacing Amplitude: 2.5 V
Lead Channel Setting Pacing Pulse Width: 0.5 ms
Lead Channel Setting Sensing Sensitivity: 0.5 mV
Pulse Gen Serial Number: 7179751

## 2016-12-21 DIAGNOSIS — I509 Heart failure, unspecified: Secondary | ICD-10-CM | POA: Diagnosis not present

## 2016-12-21 DIAGNOSIS — I429 Cardiomyopathy, unspecified: Secondary | ICD-10-CM | POA: Diagnosis not present

## 2016-12-21 DIAGNOSIS — E039 Hypothyroidism, unspecified: Secondary | ICD-10-CM | POA: Diagnosis not present

## 2016-12-21 DIAGNOSIS — Z6825 Body mass index (BMI) 25.0-25.9, adult: Secondary | ICD-10-CM | POA: Diagnosis not present

## 2016-12-21 DIAGNOSIS — E7800 Pure hypercholesterolemia, unspecified: Secondary | ICD-10-CM | POA: Diagnosis not present

## 2016-12-21 DIAGNOSIS — I2510 Atherosclerotic heart disease of native coronary artery without angina pectoris: Secondary | ICD-10-CM | POA: Diagnosis not present

## 2016-12-21 DIAGNOSIS — Z299 Encounter for prophylactic measures, unspecified: Secondary | ICD-10-CM | POA: Diagnosis not present

## 2016-12-21 DIAGNOSIS — E1165 Type 2 diabetes mellitus with hyperglycemia: Secondary | ICD-10-CM | POA: Diagnosis not present

## 2016-12-21 DIAGNOSIS — F419 Anxiety disorder, unspecified: Secondary | ICD-10-CM | POA: Diagnosis not present

## 2016-12-30 ENCOUNTER — Encounter (INDEPENDENT_AMBULATORY_CARE_PROVIDER_SITE_OTHER): Payer: Self-pay

## 2016-12-30 ENCOUNTER — Ambulatory Visit (INDEPENDENT_AMBULATORY_CARE_PROVIDER_SITE_OTHER): Payer: Medicare Other

## 2016-12-30 DIAGNOSIS — Z95810 Presence of automatic (implantable) cardiac defibrillator: Secondary | ICD-10-CM

## 2016-12-30 DIAGNOSIS — I5022 Chronic systolic (congestive) heart failure: Secondary | ICD-10-CM | POA: Diagnosis not present

## 2016-12-30 DIAGNOSIS — H353211 Exudative age-related macular degeneration, right eye, with active choroidal neovascularization: Secondary | ICD-10-CM | POA: Diagnosis not present

## 2016-12-30 DIAGNOSIS — H353122 Nonexudative age-related macular degeneration, left eye, intermediate dry stage: Secondary | ICD-10-CM | POA: Diagnosis not present

## 2016-12-30 NOTE — Progress Notes (Signed)
EPIC Encounter for ICM Monitoring  Patient Name: Clayton Morton is a 83 y.o. male Date: 12/30/2016 Primary Care Physican: Glenda Chroman, MD Primary Cardiologist: McDowell/Bensimhon Electrophysiologist: Allred Dry Weight:Previous weight 154lbs        Attempted call to patient and unable to reach.  Left detailed message regarding transmission.  Transmission reviewed.    Thoracic impedance normal but was abnormal suggesting fluid accumulation from 12/11/2016 to 12/16/2016 and 12/26/2016 to 12/28/2016.  Prescribed dosage: Furosemide 40 mg alternating with 20 mg every other day(per 8/28 note).  Potassium 20 mEq take one tablet on the days that Lasix is used 20 mg daily (per 8/28 note).  Labs: 11/12/2016 Creatinine 2.80, BUN 79, Potassium 4.5, Sodium 147, EGFR 20-23 05/05/2016 Creatinine 2.41, BUN 50, Potassium 4.4, Sodium 145, EGFR 24-28 04/20/2016 Creatinine 2.51, BUN 45, Potassium 4.5, Sodium 140, EGFR 22-26  05/16/2017Creatinine 1.99, BUN 43, Potassium 4.2, Sodium 139, EGFR 30-35   Recommendations: Left voice mail with ICM number and encouraged to call if experiencing any fluid symptoms.  Follow-up plan: ICM clinic phone appointment on 01/31/2017.   Copy of ICM check sent to [name].   3 month ICM trend: 12/30/2016   1 Year ICM trend:      Rosalene Billings, RN 12/30/2016 10:33 AM

## 2016-12-31 ENCOUNTER — Telehealth: Payer: Self-pay

## 2016-12-31 NOTE — Telephone Encounter (Signed)
Remote ICM transmission received.  Attempted call to patient and left detailed message regarding transmission and next ICM scheduled for 01/31/2017.  Advised to return call for any fluid symptoms or questions.

## 2017-01-31 ENCOUNTER — Ambulatory Visit (INDEPENDENT_AMBULATORY_CARE_PROVIDER_SITE_OTHER): Payer: Medicare Other

## 2017-01-31 DIAGNOSIS — I5022 Chronic systolic (congestive) heart failure: Secondary | ICD-10-CM

## 2017-01-31 DIAGNOSIS — Z95810 Presence of automatic (implantable) cardiac defibrillator: Secondary | ICD-10-CM | POA: Diagnosis not present

## 2017-02-01 NOTE — Progress Notes (Signed)
EPIC Encounter for ICM Monitoring  Patient Name: Clayton Morton is a 83 y.o. male Date: 02/01/2017 Primary Care Physican: Glenda Chroman, MD Primary Cardiologist: McDowell/Bensimhon Electrophysiologist: Allred Dry Weight: 153lbs       Heart Failure questions reviewed, pt asymptomatic.   Thoracic impedance normal.  Prescribed dosage: Furosemide 40 mg alternating with 20 mg every other day. Potassium 20 mEqtake one tablet dailywhen taking Furosemide.  Labs: 11/12/2016 Creatinine 2.80, BUN 79, Potassium 4.5, Sodium 147, EGFR 20-23 05/05/2016 Creatinine 2.41, BUN 50, Potassium 4.4, Sodium 145, EGFR 24-28 04/20/2016 Creatinine 2.51, BUN 45, Potassium 4.5, Sodium 140, EGFR 22-26  05/16/2017Creatinine 1.99, BUN 43, Potassium 4.2, Sodium 139, EGFR 30-35   Recommendations: No changes.   Encouraged to call for fluid symptoms.  Follow-up plan: ICM clinic phone appointment on 03/07/2017.    Copy of ICM check sent to [name].   3 month ICM trend: 01/31/2017    1 Year ICM trend:       Rosalene Billings, RN 02/01/2017 11:38 AM

## 2017-02-14 ENCOUNTER — Other Ambulatory Visit (HOSPITAL_COMMUNITY): Payer: Self-pay | Admitting: *Deleted

## 2017-02-14 MED ORDER — CARVEDILOL 12.5 MG PO TABS: 12.5000 mg | ORAL_TABLET | Freq: Two times a day (BID) | ORAL | 3 refills | Status: DC

## 2017-02-24 DIAGNOSIS — E7800 Pure hypercholesterolemia, unspecified: Secondary | ICD-10-CM | POA: Diagnosis not present

## 2017-02-24 DIAGNOSIS — I10 Essential (primary) hypertension: Secondary | ICD-10-CM | POA: Diagnosis not present

## 2017-02-24 DIAGNOSIS — E119 Type 2 diabetes mellitus without complications: Secondary | ICD-10-CM | POA: Diagnosis not present

## 2017-02-24 DIAGNOSIS — I2510 Atherosclerotic heart disease of native coronary artery without angina pectoris: Secondary | ICD-10-CM | POA: Diagnosis not present

## 2017-02-25 ENCOUNTER — Encounter: Payer: Self-pay | Admitting: Internal Medicine

## 2017-02-25 ENCOUNTER — Ambulatory Visit (INDEPENDENT_AMBULATORY_CARE_PROVIDER_SITE_OTHER): Payer: Medicare Other | Admitting: Internal Medicine

## 2017-02-25 VITALS — BP 172/66 | HR 60 | Ht 68.0 in | Wt 166.2 lb

## 2017-02-25 DIAGNOSIS — I5022 Chronic systolic (congestive) heart failure: Secondary | ICD-10-CM | POA: Diagnosis not present

## 2017-02-25 DIAGNOSIS — I2589 Other forms of chronic ischemic heart disease: Secondary | ICD-10-CM

## 2017-02-25 DIAGNOSIS — I255 Ischemic cardiomyopathy: Secondary | ICD-10-CM | POA: Diagnosis not present

## 2017-02-25 NOTE — Progress Notes (Signed)
PCP: Glenda Chroman, MD Primary Cardiologist:  Dr Domenic Polite Also followed in CHF clinic Primary EP: Dr Rayann Heman  Clayton Morton is a 83 y.o. male who presents today for routine electrophysiology followup.  Since last being seen in our clinic, the patient reports doing very well.  Today, he denies symptoms of palpitations, chest pain, shortness of breath,  lower extremity edema, dizziness, presyncope, syncope, or ICD shocks.  The patient is otherwise without complaint today.   Past Medical History:  Diagnosis Date  . Age-related macular degeneration, wet, right eye ([place])   . AICD (automatic cardioverter/defibrillator) present 10/30/2014  . Cerebrovascular accident [facility])    Right subcortical infarct 2006  . Chronic systolic congestive heart failure ([place])    a) ECHO (02/2012): EF 30-35%, no obvious thrombus b) ECHO (11/2013): EF 10%, AV mildly calcified, mild MR, RV mod dialted and sys fx sev reduced, TAPSE 0.8, lateral annulus peak S velocity 5.4 c) RHC (11/29/13): RA 15, RV: 65/19, PA 49%, CO/CI: 3.25 / 1.7 d) RHC (12/03/13): RA 1, RV 25/1/2, PA 27/10 (16), PCWP 6, Fick CO/CI: 5.3 / 2.9, PVR 1.9 WU, PA sat 72 and 74%   . Coronary atherosclerosis of native coronary artery    1) CABG 2005 2) LHC (910/15): patent SVG to PDA, occlusion of SVG sequence to PLA, patency of LIMA to LAD and patency of SVG to diagonal with sev stenosis at coronary anastomotic site  . Diabetes mellitus, type 2 ([place])   . Essential hypertension, benign   . History of gout   . Hyperlipidemia   . Ischemic cardiomyopathy   . Kidney stones   . Macular degeneration    Dr. [name]  . Non-ST elevation MI (NSTEMI) ([place])    2005  . Varicose veins    Past Surgical History:  Procedure Laterality Date  . CARDIAC CATHETERIZATION    . CARDIAC DEFIBRILLATOR PLACEMENT  10/30/2014  . CATARACT EXTRACTION W/ INTRAOCULAR LENS  IMPLANT, BILATERAL Bilateral ~ 1991  . CORONARY ANGIOPLASTY    . CORONARY ARTERY BYPASS GRAFT  2005   LIMA to  LAD, SVG diagonal, SVG to PDA and PLA  . CYSTOSCOPY W/ LITHOLAPAXY / EHL    . EP IMPLANTABLE DEVICE N/A 10/30/2014   Procedure: ICD Implant;  Surgeon: Deboraha Sprang, MD, SJM Ellipse DR implanted for primary prevention  . LEFT AND RIGHT HEART CATHETERIZATION WITH CORONARY/GRAFT ANGIOGRAM N/A 11/29/2013   Procedure: LEFT AND RIGHT HEART CATHETERIZATION WITH Beatrix Fetters;  Surgeon: Blane Ohara, MD;  Location: [facility] CATH LAB;  Service: Cardiovascular;  Laterality: N/A;  . RIGHT HEART CATHETERIZATION N/A 12/03/2013   Procedure: RIGHT HEART CATH;  Surgeon: Jolaine Artist, MD;  Location: [facility] CATH LAB;  Service: Cardiovascular;  Laterality: N/A;  . TONSILLECTOMY      ROS- all systems are reviewed and negative except as per HPI above  Current Outpatient Medications  Medication Sig Dispense Refill  . ALPRAZolam (XANAX) 0.25 MG tablet Take 0.5 tablets by mouth daily.  0  . aspirin 81 MG tablet Take 81 mg by mouth daily.    [name] atorvastatin (LIPITOR) 80 MG tablet take 1 tablet by mouth once daily 90 tablet 3  . carvedilol (COREG) 12.5 MG tablet Take 1 tablet (12.5 mg total) by mouth 2 (two) times daily with a meal. 180 tablet 3  . furosemide (LASIX) 20 MG tablet take 2 tablets by mouth once daily then ALTERNATING WITH 1 TABLET EVERY OTHER DAY 90 tablet 0  . glipiZIDE (GLUCOTROL  XL) 5 MG 24 hr tablet Take 1 tablet by mouth 2 (two) times daily.  1  . hydrALAZINE (APRESOLINE) 50 MG tablet take 1 and 1/2 tablet by mouth three times a day 405 tablet 1  . isosorbide mononitrate (IMDUR) 60 MG 24 hr tablet take 1 tablet by mouth once daily 90 tablet 1  . levothyroxine (SYNTHROID, LEVOTHROID) 50 MCG tablet Take 50 mcg by mouth every morning.     [name] losartan (COZAAR) 50 MG tablet take 1 tablet by mouth every morning and 1/2 tablet by mouth every evening 135 tablet 3  . Omega-3 Fatty Acids (FISH OIL) 1000 MG CAPS Take 1 capsule by mouth 2 (two) times daily.    . ONE TOUCH ULTRA TEST test strip 1 each  by Other route 2 (two) times daily.   0  . potassium chloride SA (K-DUR,KLOR-CON) 20 MEQ tablet take 1 tablet by mouth if needed **TAKE ONLY IF TAKING FUROSEMIDE 90 tablet 1  . spironolactone (ALDACTONE) 25 MG tablet take 1/2 tablet by mouth once daily 45 tablet 1   No current facility-administered medications for this visit.     Physical Exam: Vitals:   02/25/17 1035  BP: (!) 172/66  Pulse: 60  SpO2: 100%  Weight: 166 lb 3.2 oz (75.4 kg)  Height: 5\' 8"  (1.727 m)    GEN- The patient is well appearing, alert and oriented x 3 today.   Head- normocephalic, atraumatic Eyes-  Sclera clear, conjunctiva pink Ears- hearing intact Oropharynx- clear Lungs- Clear to ausculation bilaterally, normal work of breathing Chest- ICD pocket is well healed Heart- Regular rate and rhythm, no murmurs, rubs or gallops, PMI not laterally displaced GI- soft, NT, ND, + BS Extremities- no clubbing, cyanosis, or edema  ICD interrogation- reviewed in detail today,  See PACEART report   Assessment and Plan:  1.  Chronic systolic dysfunction/ ischemic CM euvolemic today Stable on an appropriate medical regimen Normal ICD function See Pace Art report No changes today  2. HTN Elevated today He reports that his bp is well controlled at home and does not wish to make any changes today    Merlin Return to see me in a year  Thompson Grayer MD, [facility] 02/25/2017 10:59 AM

## 2017-02-25 NOTE — Patient Instructions (Signed)
Medication Instructions:  Continue all current medications.  Labwork: none  Testing/Procedures: none  Follow-Up: Your physician wants you to follow up in:  1 year.  You will receive a reminder letter in the mail one-two months in advance.  If you don't receive a letter, please call our office to schedule the follow up appointment   Any Other Special Instructions Will Be Listed Below (If Applicable). Remote monitoring is used to monitor your Pacemaker of ICD from home. This monitoring reduces the number of office visits required to check your device to one time per year. It allows [place] to keep an eye on the functioning of your device to ensure it is working properly. You are scheduled for a device check from home on 03/07/2017. You may send your transmission at any time that day. If you have a wireless device, the transmission will be sent automatically. After your physician reviews your transmission, you will receive a postcard with your next transmission date.  If you need a refill on your cardiac medications before your next appointment, please call your pharmacy.

## 2017-02-28 LAB — CUP PACEART INCLINIC DEVICE CHECK
Battery Remaining Longevity: 69 mo
Brady Statistic RA Percent Paced: 9.2 %
Brady Statistic RV Percent Paced: 1.5 %
Date Time Interrogation Session: 20181207164620
HighPow Impedance: 59.625
Implantable Lead Implant Date: 20160810
Implantable Lead Implant Date: 20160810
Implantable Lead Location: 753859
Implantable Lead Location: 753860
Implantable Pulse Generator Implant Date: 20160810
Lead Channel Impedance Value: 300 Ohm
Lead Channel Impedance Value: 412.5 Ohm
Lead Channel Pacing Threshold Amplitude: 1 V
Lead Channel Pacing Threshold Amplitude: 1 V
Lead Channel Pacing Threshold Amplitude: 1.125 V
Lead Channel Pacing Threshold Pulse Width: 0.5 ms
Lead Channel Pacing Threshold Pulse Width: 0.5 ms
Lead Channel Pacing Threshold Pulse Width: 0.5 ms
Lead Channel Sensing Intrinsic Amplitude: 1.7 mV
Lead Channel Sensing Intrinsic Amplitude: 7.5 mV
Lead Channel Setting Pacing Amplitude: 2.125
Lead Channel Setting Pacing Amplitude: 2.5 V
Lead Channel Setting Pacing Pulse Width: 0.5 ms
Lead Channel Setting Sensing Sensitivity: 0.5 mV
Pulse Gen Serial Number: 7179751

## 2017-03-07 ENCOUNTER — Ambulatory Visit (INDEPENDENT_AMBULATORY_CARE_PROVIDER_SITE_OTHER): Payer: Medicare Other | Admitting: *Deleted

## 2017-03-07 DIAGNOSIS — I5022 Chronic systolic (congestive) heart failure: Secondary | ICD-10-CM | POA: Diagnosis not present

## 2017-03-07 DIAGNOSIS — I255 Ischemic cardiomyopathy: Secondary | ICD-10-CM

## 2017-03-07 DIAGNOSIS — Z95810 Presence of automatic (implantable) cardiac defibrillator: Secondary | ICD-10-CM

## 2017-03-07 NOTE — Progress Notes (Signed)
EPIC Encounter for ICM Monitoring  Patient Name: Clayton Morton is a 83 y.o. male Date: 03/07/2017 Primary Care Physican: Glenda Chroman, MD Primary Cardiologist: McDowell/Bensimhon Electrophysiologist: Allred Dry Weight:154lbs        Heart Failure questions reviewed, pt asymptomatic.   Thoracic impedance normal.  Prescribed dosage: Furosemide 40 mg alternating with 20 mg every other day. Potassium 20 mEqtake one tablet dailywhen taking Furosemide.  Labs: 11/12/2016 Creatinine 2.80, BUN 79, Potassium 4.5, Sodium 147, EGFR 20-23 05/05/2016 Creatinine 2.41, BUN 50, Potassium 4.4, Sodium 145, EGFR 24-28 04/20/2016 Creatinine 2.51, BUN 45, Potassium 4.5, Sodium 140, EGFR 22-26  05/16/2017Creatinine 1.99, BUN 43, Potassium 4.2, Sodium 139, EGFR 30-35  Recommendations: No changes.   Encouraged to call for fluid symptoms.  Follow-up plan: ICM clinic phone appointment on 04/07/2017.    Copy of ICM check sent to [name].   3 month ICM trend: 03/07/2017    1 Year ICM trend:       Rosalene Billings, RN 03/07/2017 3:19 PM

## 2017-03-07 NOTE — Progress Notes (Signed)
Remote ICD transmission.   

## 2017-03-09 ENCOUNTER — Other Ambulatory Visit: Payer: Self-pay | Admitting: *Deleted

## 2017-03-09 ENCOUNTER — Encounter: Payer: Self-pay | Admitting: Cardiology

## 2017-03-09 DIAGNOSIS — I509 Heart failure, unspecified: Secondary | ICD-10-CM

## 2017-03-09 LAB — CUP PACEART REMOTE DEVICE CHECK
Battery Remaining Longevity: 62 mo
Battery Remaining Percentage: 67 %
Battery Voltage: 2.95 V
Brady Statistic AP VP Percent: 1.2 %
Brady Statistic AP VS Percent: 10 %
Brady Statistic AS VP Percent: 1 %
Brady Statistic AS VS Percent: 88 %
Brady Statistic RA Percent Paced: 9.4 %
Brady Statistic RV Percent Paced: 1.2 %
Date Time Interrogation Session: 20181217070019
HighPow Impedance: 60 Ohm
HighPow Impedance: 60 Ohm
Implantable Lead Implant Date: 20160810
Implantable Lead Implant Date: 20160810
Implantable Lead Location: 753859
Implantable Lead Location: 753860
Implantable Pulse Generator Implant Date: 20160810
Lead Channel Impedance Value: 300 Ohm
Lead Channel Impedance Value: 400 Ohm
Lead Channel Pacing Threshold Amplitude: 1 V
Lead Channel Pacing Threshold Amplitude: 1.125 V
Lead Channel Pacing Threshold Pulse Width: 0.5 ms
Lead Channel Pacing Threshold Pulse Width: 0.5 ms
Lead Channel Sensing Intrinsic Amplitude: 1.2 mV
Lead Channel Sensing Intrinsic Amplitude: 8.3 mV
Lead Channel Setting Pacing Amplitude: 2.125
Lead Channel Setting Pacing Amplitude: 2.5 V
Lead Channel Setting Pacing Pulse Width: 0.5 ms
Lead Channel Setting Sensing Sensitivity: 0.5 mV
Pulse Gen Serial Number: 7179751

## 2017-03-09 MED ORDER — HYDRALAZINE HCL 50 MG PO TABS: ORAL_TABLET | ORAL | 0 refills | Status: DC

## 2017-03-09 MED ORDER — SPIRONOLACTONE 25 MG PO TABS: 12.5000 mg | ORAL_TABLET | Freq: Every day | ORAL | 0 refills | Status: DC

## 2017-03-09 MED ORDER — ISOSORBIDE MONONITRATE ER 60 MG PO TB24: 60.0000 mg | ORAL_TABLET | Freq: Every day | ORAL | 0 refills | Status: DC

## 2017-04-07 ENCOUNTER — Ambulatory Visit (INDEPENDENT_AMBULATORY_CARE_PROVIDER_SITE_OTHER): Payer: Medicare Other

## 2017-04-07 DIAGNOSIS — Z95810 Presence of automatic (implantable) cardiac defibrillator: Secondary | ICD-10-CM

## 2017-04-07 DIAGNOSIS — I5022 Chronic systolic (congestive) heart failure: Secondary | ICD-10-CM | POA: Diagnosis not present

## 2017-04-07 DIAGNOSIS — H353122 Nonexudative age-related macular degeneration, left eye, intermediate dry stage: Secondary | ICD-10-CM | POA: Diagnosis not present

## 2017-04-07 DIAGNOSIS — H353213 Exudative age-related macular degeneration, right eye, with inactive scar: Secondary | ICD-10-CM | POA: Diagnosis not present

## 2017-04-07 NOTE — Progress Notes (Signed)
EPIC Encounter for ICM Monitoring  Patient Name: Clayton Morton is a 84 y.o. male Date: 04/07/2017 Primary Care Physican: Glenda Chroman, MD Primary Cardiologist: McDowell/Bensimhon Electrophysiologist: Allred Dry Weight:156lbs           Heart Failure questions reviewed, pt asymptomatic.   Thoracic impedance normal.  Prescribed dosage: Furosemide 40 mg alternating with 20 mg every other day. Potassium 20 mEqtake one tablet dailywhen taking Furosemide.  Labs: 11/12/2016 Creatinine 2.80, BUN 79, Potassium 4.5, Sodium 147, EGFR 20-23 05/05/2016 Creatinine 2.41, BUN 50, Potassium 4.4, Sodium 145, EGFR 24-28 04/20/2016 Creatinine 2.51, BUN 45, Potassium 4.5, Sodium 140, EGFR 22-26  05/16/2017Creatinine 1.99, BUN 43, Potassium 4.2, Sodium 139, EGFR 30-35  Recommendations: No changes.   Encouraged to call for fluid symptoms.  Follow-up plan: ICM clinic phone appointment on 05/09/2017.    Copy of ICM check sent to [name].   3 month ICM trend: 04/07/2017    1 Year ICM trend:       Rosalene Billings, RN 04/07/2017 8:55 AM

## 2017-05-04 DIAGNOSIS — Z299 Encounter for prophylactic measures, unspecified: Secondary | ICD-10-CM | POA: Diagnosis not present

## 2017-05-04 DIAGNOSIS — I509 Heart failure, unspecified: Secondary | ICD-10-CM | POA: Diagnosis not present

## 2017-05-04 DIAGNOSIS — Z6825 Body mass index (BMI) 25.0-25.9, adult: Secondary | ICD-10-CM | POA: Diagnosis not present

## 2017-05-04 DIAGNOSIS — E1165 Type 2 diabetes mellitus with hyperglycemia: Secondary | ICD-10-CM | POA: Diagnosis not present

## 2017-05-04 DIAGNOSIS — I429 Cardiomyopathy, unspecified: Secondary | ICD-10-CM | POA: Diagnosis not present

## 2017-05-04 DIAGNOSIS — F419 Anxiety disorder, unspecified: Secondary | ICD-10-CM | POA: Diagnosis not present

## 2017-05-04 DIAGNOSIS — E039 Hypothyroidism, unspecified: Secondary | ICD-10-CM | POA: Diagnosis not present

## 2017-05-05 DIAGNOSIS — I2510 Atherosclerotic heart disease of native coronary artery without angina pectoris: Secondary | ICD-10-CM | POA: Diagnosis not present

## 2017-05-05 DIAGNOSIS — I10 Essential (primary) hypertension: Secondary | ICD-10-CM | POA: Diagnosis not present

## 2017-05-05 DIAGNOSIS — E119 Type 2 diabetes mellitus without complications: Secondary | ICD-10-CM | POA: Diagnosis not present

## 2017-05-05 DIAGNOSIS — E7800 Pure hypercholesterolemia, unspecified: Secondary | ICD-10-CM | POA: Diagnosis not present

## 2017-05-12 ENCOUNTER — Other Ambulatory Visit: Payer: Self-pay | Admitting: Cardiology

## 2017-05-20 NOTE — Progress Notes (Signed)
No ICM remote transmission received for 05/09/2017 and next ICM transmission scheduled for 06/06/2017.

## 2017-06-01 DIAGNOSIS — I2510 Atherosclerotic heart disease of native coronary artery without angina pectoris: Secondary | ICD-10-CM | POA: Diagnosis not present

## 2017-06-01 DIAGNOSIS — E7800 Pure hypercholesterolemia, unspecified: Secondary | ICD-10-CM | POA: Diagnosis not present

## 2017-06-01 DIAGNOSIS — I10 Essential (primary) hypertension: Secondary | ICD-10-CM | POA: Diagnosis not present

## 2017-06-01 DIAGNOSIS — E119 Type 2 diabetes mellitus without complications: Secondary | ICD-10-CM | POA: Diagnosis not present

## 2017-06-06 ENCOUNTER — Ambulatory Visit (INDEPENDENT_AMBULATORY_CARE_PROVIDER_SITE_OTHER): Payer: Medicare Other | Admitting: *Deleted

## 2017-06-06 DIAGNOSIS — I5022 Chronic systolic (congestive) heart failure: Secondary | ICD-10-CM | POA: Diagnosis not present

## 2017-06-06 DIAGNOSIS — Z95810 Presence of automatic (implantable) cardiac defibrillator: Secondary | ICD-10-CM | POA: Diagnosis not present

## 2017-06-06 DIAGNOSIS — I255 Ischemic cardiomyopathy: Secondary | ICD-10-CM

## 2017-06-06 NOTE — Progress Notes (Signed)
Remote ICD transmission.   

## 2017-06-06 NOTE — Progress Notes (Signed)
EPIC Encounter for ICM Monitoring  Patient Name: Clayton Morton is a 84 y.o. male Date: 06/06/2017 Primary Care Physican: Glenda Chroman, MD Primary Cardiologist: McDowell/Bensimhon Electrophysiologist: Allred Dry Weight:155lbs       Heart Failure questions reviewed, pt asymptomatic.   Thoracic impedance normal.  Prescribed dosage: Furosemide 40 mg alternating with 20 mg every other day. Potassium 20 mEqtake one tablet dailywhen taking Furosemide.  Labs: 11/12/2016 Creatinine 2.80, BUN 79, Potassium 4.5, Sodium 147, EGFR 20-23 05/05/2016 Creatinine 2.41, BUN 50, Potassium 4.4, Sodium 145, EGFR 24-28 04/20/2016 Creatinine 2.51, BUN 45, Potassium 4.5, Sodium 140, EGFR 22-26  05/16/2017Creatinine 1.99, BUN 43, Potassium 4.2, Sodium 139, EGFR 30-35  Recommendations: No changes.  Encouraged to call for fluid symptoms.  Follow-up plan: ICM clinic phone appointment on 07/07/2017.    Copy of ICM check sent to [name].   3 month ICM trend: 06/06/2017    1 Year ICM trend:       Rosalene Billings, RN 06/06/2017 10:21 AM

## 2017-06-07 ENCOUNTER — Other Ambulatory Visit (HOSPITAL_COMMUNITY): Payer: Self-pay | Admitting: Internal Medicine

## 2017-06-07 LAB — CUP PACEART REMOTE DEVICE CHECK
Battery Remaining Longevity: 60 mo
Battery Remaining Percentage: 64 %
Battery Voltage: 2.95 V
Brady Statistic AP VP Percent: 1 %
Brady Statistic AP VS Percent: 11 %
Brady Statistic AS VP Percent: 1 %
Brady Statistic AS VS Percent: 87 %
Brady Statistic RA Percent Paced: 10 %
Brady Statistic RV Percent Paced: 1 %
Date Time Interrogation Session: 20190318060017
HighPow Impedance: 61 Ohm
HighPow Impedance: 61 Ohm
Implantable Lead Implant Date: 20160810
Implantable Lead Implant Date: 20160810
Implantable Lead Location: 753859
Implantable Lead Location: 753860
Implantable Pulse Generator Implant Date: 20160810
Lead Channel Impedance Value: 300 Ohm
Lead Channel Impedance Value: 410 Ohm
Lead Channel Pacing Threshold Amplitude: 1 V
Lead Channel Pacing Threshold Amplitude: 1.125 V
Lead Channel Pacing Threshold Pulse Width: 0.5 ms
Lead Channel Pacing Threshold Pulse Width: 0.5 ms
Lead Channel Sensing Intrinsic Amplitude: 1.3 mV
Lead Channel Sensing Intrinsic Amplitude: 7.2 mV
Lead Channel Setting Pacing Amplitude: 2.125
Lead Channel Setting Pacing Amplitude: 2.5 V
Lead Channel Setting Pacing Pulse Width: 0.5 ms
Lead Channel Setting Sensing Sensitivity: 0.5 mV
Pulse Gen Serial Number: 7179751

## 2017-06-08 ENCOUNTER — Encounter: Payer: Self-pay | Admitting: Cardiology

## 2017-07-01 NOTE — Telephone Encounter (Signed)
This encounter was created in error - please disregard.

## 2017-07-07 ENCOUNTER — Ambulatory Visit (INDEPENDENT_AMBULATORY_CARE_PROVIDER_SITE_OTHER): Payer: Medicare Other

## 2017-07-07 DIAGNOSIS — Z95810 Presence of automatic (implantable) cardiac defibrillator: Secondary | ICD-10-CM | POA: Diagnosis not present

## 2017-07-07 DIAGNOSIS — I5022 Chronic systolic (congestive) heart failure: Secondary | ICD-10-CM | POA: Diagnosis not present

## 2017-07-07 NOTE — Progress Notes (Signed)
EPIC Encounter for ICM Monitoring  Patient Name: Clayton Morton is a 84 y.o. male Date: 07/07/2017 Primary Care Physican: Vyas, Dhruv B, MD Primary Cardiologist: McDowell/Bensimhon Electrophysiologist: Allred Dry Weight:156lbs        Heart Failure questions reviewed, pt asymptomatic.  Patient said he passed out when sitting in a chair on 07/05/2017 between 8:00-830 AM that lasted about a minute. He did not have any confusion afterwards and felt fine after he came too and feels fine now. He thinks it is due to him taking medication early without eating which has happened before.    Thoracic impedance normal.  Prescribed dosage: Furosemide 40 mg alternating with 20 mg every other day. Potassium 20 mEqtake one tablet dailywhen taking Furosemide.  Labs: 11/12/2016 Creatinine 2.80, BUN 79, Potassium 4.5, Sodium 147, EGFR 20-23 05/05/2016 Creatinine 2.41, BUN 50, Potassium 4.4, Sodium 145, EGFR 24-28 04/20/2016 Creatinine 2.51, BUN 45, Potassium 4.5, Sodium 140, EGFR 22-26  05/16/2017Creatinine 1.99, BUN 43, Potassium 4.2, Sodium 139, EGFR 30-35  Recommendations: No changes.   Encouraged to call for fluid symptoms.  Follow-up plan: ICM clinic phone appointment on 08/08/2017.   Copy of ICM check sent to Dr. Allred.   3 month ICM trend: 07/07/2017    1 Year ICM trend:       Laurie S Short, RN 07/07/2017 2:51 PM   

## 2017-07-08 ENCOUNTER — Telehealth: Payer: Self-pay | Admitting: *Deleted

## 2017-07-08 NOTE — Telephone Encounter (Signed)
The following message was received.  Short, Laurie Panda, RN  P Cv Div [facility] Device        Hi,   Patient reported passing out on 4/16 between 8-:830 AM. Lasted <1 minute and was fine after he came to. He thinks it was related to taking morning medications without food. Could you review his reported dated 4/18. I didn't see anything on the report but would like for you to review. Thanks.   Margarita Grizzle     Transmission reviewed from 07/07/17. No episodes. Presenting rhythm Ap/As Vs. LMOM per DPR and gave call back number if needed.

## 2017-07-15 ENCOUNTER — Other Ambulatory Visit: Payer: Self-pay | Admitting: Cardiology

## 2017-07-27 DIAGNOSIS — E119 Type 2 diabetes mellitus without complications: Secondary | ICD-10-CM | POA: Diagnosis not present

## 2017-07-27 DIAGNOSIS — E7800 Pure hypercholesterolemia, unspecified: Secondary | ICD-10-CM | POA: Diagnosis not present

## 2017-07-27 DIAGNOSIS — I10 Essential (primary) hypertension: Secondary | ICD-10-CM | POA: Diagnosis not present

## 2017-07-27 DIAGNOSIS — I2510 Atherosclerotic heart disease of native coronary artery without angina pectoris: Secondary | ICD-10-CM | POA: Diagnosis not present

## 2017-07-28 ENCOUNTER — Other Ambulatory Visit (HOSPITAL_COMMUNITY): Payer: Self-pay | Admitting: Cardiology

## 2017-07-28 ENCOUNTER — Other Ambulatory Visit: Payer: Self-pay | Admitting: Cardiology

## 2017-07-29 ENCOUNTER — Other Ambulatory Visit: Payer: Self-pay | Admitting: Cardiology

## 2017-07-29 DIAGNOSIS — I509 Heart failure, unspecified: Secondary | ICD-10-CM

## 2017-08-06 DIAGNOSIS — I252 Old myocardial infarction: Secondary | ICD-10-CM | POA: Diagnosis not present

## 2017-08-06 DIAGNOSIS — E1122 Type 2 diabetes mellitus with diabetic chronic kidney disease: Secondary | ICD-10-CM | POA: Diagnosis present

## 2017-08-06 DIAGNOSIS — E039 Hypothyroidism, unspecified: Secondary | ICD-10-CM | POA: Diagnosis present

## 2017-08-06 DIAGNOSIS — N202 Calculus of kidney with calculus of ureter: Secondary | ICD-10-CM | POA: Diagnosis not present

## 2017-08-06 DIAGNOSIS — I255 Ischemic cardiomyopathy: Secondary | ICD-10-CM | POA: Diagnosis present

## 2017-08-06 DIAGNOSIS — Z87442 Personal history of urinary calculi: Secondary | ICD-10-CM | POA: Diagnosis not present

## 2017-08-06 DIAGNOSIS — E7800 Pure hypercholesterolemia, unspecified: Secondary | ICD-10-CM | POA: Diagnosis present

## 2017-08-06 DIAGNOSIS — Z8673 Personal history of transient ischemic attack (TIA), and cerebral infarction without residual deficits: Secondary | ICD-10-CM | POA: Diagnosis not present

## 2017-08-06 DIAGNOSIS — I5022 Chronic systolic (congestive) heart failure: Secondary | ICD-10-CM | POA: Diagnosis not present

## 2017-08-06 DIAGNOSIS — Z95810 Presence of automatic (implantable) cardiac defibrillator: Secondary | ICD-10-CM | POA: Diagnosis not present

## 2017-08-06 DIAGNOSIS — N189 Chronic kidney disease, unspecified: Secondary | ICD-10-CM | POA: Diagnosis not present

## 2017-08-06 DIAGNOSIS — Z881 Allergy status to other antibiotic agents status: Secondary | ICD-10-CM | POA: Diagnosis not present

## 2017-08-06 DIAGNOSIS — N179 Acute kidney failure, unspecified: Secondary | ICD-10-CM | POA: Diagnosis not present

## 2017-08-06 DIAGNOSIS — I130 Hypertensive heart and chronic kidney disease with heart failure and stage 1 through stage 4 chronic kidney disease, or unspecified chronic kidney disease: Secondary | ICD-10-CM | POA: Diagnosis not present

## 2017-08-06 DIAGNOSIS — N201 Calculus of ureter: Secondary | ICD-10-CM | POA: Diagnosis not present

## 2017-08-06 DIAGNOSIS — Z79899 Other long term (current) drug therapy: Secondary | ICD-10-CM | POA: Diagnosis not present

## 2017-08-06 DIAGNOSIS — N200 Calculus of kidney: Secondary | ICD-10-CM | POA: Diagnosis not present

## 2017-08-06 DIAGNOSIS — E875 Hyperkalemia: Secondary | ICD-10-CM | POA: Diagnosis not present

## 2017-08-06 DIAGNOSIS — Z7982 Long term (current) use of aspirin: Secondary | ICD-10-CM | POA: Diagnosis not present

## 2017-08-08 ENCOUNTER — Ambulatory Visit (INDEPENDENT_AMBULATORY_CARE_PROVIDER_SITE_OTHER): Payer: Medicare Other

## 2017-08-08 DIAGNOSIS — Z95810 Presence of automatic (implantable) cardiac defibrillator: Secondary | ICD-10-CM | POA: Diagnosis not present

## 2017-08-08 DIAGNOSIS — I5022 Chronic systolic (congestive) heart failure: Secondary | ICD-10-CM | POA: Diagnosis not present

## 2017-08-08 NOTE — Progress Notes (Signed)
EPIC Encounter for ICM Monitoring  Patient Name: Clayton Morton is a 84 y.o. male Date: 08/08/2017 Primary Care Physican: Glenda Chroman, MD Primary Cardiologist: McDowell/Bensimhon Electrophysiologist: Allred Dry Weight:162lbs (baseline 155-157 lbs)      Heart Failure questions reviewed, pt reported he was in [place] hospital x 2 days due to kidney stone and he did not have any Lasix for 3 days. He has gained 5 lbs in the last 2 days.     Thoracic impedance abnormal suggesting fluid accumulation for past 2 days which correlates with hospitalization for kidney stone.   Prescribed dosage: Furosemide 40 mg alternating with 20 mg every other day. Potassium 20 mEqtake one tablet dailywhen taking Furosemide.  Labs: 11/12/2016 Creatinine 2.80, BUN 79, Potassium 4.5, Sodium 147, EGFR 20-23 05/05/2016 Creatinine 2.41, BUN 50, Potassium 4.4, Sodium 145, EGFR 24-28 04/20/2016 Creatinine 2.51, BUN 45, Potassium 4.5, Sodium 140, EGFR 22-26  05/16/2017Creatinine 1.99, BUN 43, Potassium 4.2, Sodium 139, EGFR 30-35  Recommendations: He took Furosemide 60 mg today since he has not had any in 3 days.   Follow-up plan: ICM clinic phone appointment on 08/18/2017.  Copy of ICM check sent to Dr Haroldine Laws and [name] for review and if any recommendations will call him back.    3 month ICM trend: 08/08/2017    1 Year ICM trend:       Rosalene Billings, RN 08/08/2017 2:44 PM

## 2017-08-09 DIAGNOSIS — Z6826 Body mass index (BMI) 26.0-26.9, adult: Secondary | ICD-10-CM | POA: Diagnosis not present

## 2017-08-09 DIAGNOSIS — I509 Heart failure, unspecified: Secondary | ICD-10-CM | POA: Diagnosis not present

## 2017-08-09 DIAGNOSIS — N200 Calculus of kidney: Secondary | ICD-10-CM | POA: Diagnosis not present

## 2017-08-09 DIAGNOSIS — I10 Essential (primary) hypertension: Secondary | ICD-10-CM | POA: Diagnosis not present

## 2017-08-09 DIAGNOSIS — E1165 Type 2 diabetes mellitus with hyperglycemia: Secondary | ICD-10-CM | POA: Diagnosis not present

## 2017-08-09 DIAGNOSIS — E039 Hypothyroidism, unspecified: Secondary | ICD-10-CM | POA: Diagnosis not present

## 2017-08-09 DIAGNOSIS — M109 Gout, unspecified: Secondary | ICD-10-CM | POA: Diagnosis not present

## 2017-08-09 DIAGNOSIS — Z299 Encounter for prophylactic measures, unspecified: Secondary | ICD-10-CM | POA: Diagnosis not present

## 2017-08-09 DIAGNOSIS — Z09 Encounter for follow-up examination after completed treatment for conditions other than malignant neoplasm: Secondary | ICD-10-CM | POA: Diagnosis not present

## 2017-08-09 DIAGNOSIS — I429 Cardiomyopathy, unspecified: Secondary | ICD-10-CM | POA: Diagnosis not present

## 2017-08-11 DIAGNOSIS — H353213 Exudative age-related macular degeneration, right eye, with inactive scar: Secondary | ICD-10-CM | POA: Diagnosis not present

## 2017-08-11 DIAGNOSIS — H353221 Exudative age-related macular degeneration, left eye, with active choroidal neovascularization: Secondary | ICD-10-CM | POA: Diagnosis not present

## 2017-08-12 ENCOUNTER — Other Ambulatory Visit: Payer: Self-pay | Admitting: Cardiology

## 2017-08-18 ENCOUNTER — Ambulatory Visit (INDEPENDENT_AMBULATORY_CARE_PROVIDER_SITE_OTHER): Payer: Self-pay

## 2017-08-18 ENCOUNTER — Other Ambulatory Visit: Payer: Self-pay

## 2017-08-18 DIAGNOSIS — I5022 Chronic systolic (congestive) heart failure: Secondary | ICD-10-CM

## 2017-08-18 DIAGNOSIS — Z95810 Presence of automatic (implantable) cardiac defibrillator: Secondary | ICD-10-CM

## 2017-08-18 MED ORDER — HYDRALAZINE HCL 50 MG PO TABS: ORAL_TABLET | ORAL | 3 refills | Status: DC

## 2017-08-19 NOTE — Progress Notes (Signed)
EPIC Encounter for ICM Monitoring  Patient Name: Clayton Morton is a 84 y.o. male Date: 08/19/2017 Primary Care Physican: Glenda Chroman, MD Primary Cardiologist: McDowell/Bensimhon Electrophysiologist: Allred Dry Weight:162lbs (baseline 155-157 lbs)       Spoke with wife. Heart Failure questions reviewed, pt asymptomatic.   Thoracic impedance returned to normal after last ICM remote transmission on 08/08/2017.  Prescribed dosage: Furosemide 40 mg alternating with 20 mg every other day. Potassium 20 mEqtake one tablet dailywhen taking Furosemide.  Labs: 11/12/2016 Creatinine 2.80, BUN 79, Potassium 4.5, Sodium 147, EGFR 20-23 05/05/2016 Creatinine 2.41, BUN 50, Potassium 4.4, Sodium 145, EGFR 24-28 04/20/2016 Creatinine 2.51, BUN 45, Potassium 4.5, Sodium 140, EGFR 22-26  05/16/2017Creatinine 1.99, BUN 43, Potassium 4.2, Sodium 139, EGFR 30-35  Recommendations: No changes.   Encouraged to call for fluid symptoms.  Follow-up plan: ICM clinic phone appointment on 09/08/2017.    Copy of ICM check sent to [name].   3 month ICM trend: 08/19/2017    1 Year ICM trend:       Rosalene Billings, RN 08/19/2017 10:31 AM

## 2017-08-26 DIAGNOSIS — I2510 Atherosclerotic heart disease of native coronary artery without angina pectoris: Secondary | ICD-10-CM | POA: Diagnosis not present

## 2017-08-26 DIAGNOSIS — E119 Type 2 diabetes mellitus without complications: Secondary | ICD-10-CM | POA: Diagnosis not present

## 2017-08-26 DIAGNOSIS — E7800 Pure hypercholesterolemia, unspecified: Secondary | ICD-10-CM | POA: Diagnosis not present

## 2017-08-26 DIAGNOSIS — I10 Essential (primary) hypertension: Secondary | ICD-10-CM | POA: Diagnosis not present

## 2017-08-31 ENCOUNTER — Other Ambulatory Visit: Payer: Self-pay | Admitting: Cardiology

## 2017-08-31 DIAGNOSIS — I509 Heart failure, unspecified: Secondary | ICD-10-CM

## 2017-09-01 ENCOUNTER — Other Ambulatory Visit: Payer: Self-pay | Admitting: Cardiology

## 2017-09-01 DIAGNOSIS — I509 Heart failure, unspecified: Secondary | ICD-10-CM

## 2017-09-08 ENCOUNTER — Ambulatory Visit (INDEPENDENT_AMBULATORY_CARE_PROVIDER_SITE_OTHER): Payer: Medicare Other | Admitting: *Deleted

## 2017-09-08 DIAGNOSIS — I5022 Chronic systolic (congestive) heart failure: Secondary | ICD-10-CM

## 2017-09-08 DIAGNOSIS — I255 Ischemic cardiomyopathy: Secondary | ICD-10-CM

## 2017-09-08 DIAGNOSIS — Z95810 Presence of automatic (implantable) cardiac defibrillator: Secondary | ICD-10-CM | POA: Diagnosis not present

## 2017-09-08 NOTE — Progress Notes (Signed)
Remote ICD transmission.   

## 2017-09-09 ENCOUNTER — Telehealth: Payer: Self-pay

## 2017-09-09 ENCOUNTER — Ambulatory Visit (INDEPENDENT_AMBULATORY_CARE_PROVIDER_SITE_OTHER): Payer: Medicare Other

## 2017-09-09 DIAGNOSIS — Z95810 Presence of automatic (implantable) cardiac defibrillator: Secondary | ICD-10-CM

## 2017-09-09 DIAGNOSIS — I5022 Chronic systolic (congestive) heart failure: Secondary | ICD-10-CM

## 2017-09-09 NOTE — Telephone Encounter (Signed)
Remote ICM transmission received.  Attempted call to patient and no answer or answering machine.  

## 2017-09-09 NOTE — Progress Notes (Signed)
EPIC Encounter for ICM Monitoring  Patient Name: Clayton Morton is a 84 y.o. male Date: 09/09/2017 Primary Care Physican: Glenda Chroman, MD Primary Cardiologist: McDowell/Bensimhon Electrophysiologist: Allred Dry Weight:Previous weight 162lbs (baseline 155-157 lbs)       Attempted call to patient/wife and unable to reach.   Transmission reviewed.    Thoracic impedance normal.  Prescribed dosage: Furosemide 40 mg alternating with 20 mg every other day. Potassium 20 mEqtake one tablet dailywhen taking Furosemide.  Labs: 11/12/2016 Creatinine 2.80, BUN 79, Potassium 4.5, Sodium 147, EGFR 20-23 05/05/2016 Creatinine 2.41, BUN 50, Potassium 4.4, Sodium 145, EGFR 24-28 04/20/2016 Creatinine 2.51, BUN 45, Potassium 4.5, Sodium 140, EGFR 22-26  05/16/2017Creatinine 1.99, BUN 43, Potassium 4.2, Sodium 139, EGFR 30-35  Recommendations: NONE - Unable to reach.  Follow-up plan: ICM clinic phone appointment on 10/10/2017.    Copy of ICM check sent to [name].   3 month ICM trend: 09/08/2017    1 Year ICM trend:       Rosalene Billings, RN 09/09/2017 9:25 AM

## 2017-09-12 ENCOUNTER — Other Ambulatory Visit (HOSPITAL_COMMUNITY): Payer: Self-pay | Admitting: Cardiology

## 2017-09-15 DIAGNOSIS — H353221 Exudative age-related macular degeneration, left eye, with active choroidal neovascularization: Secondary | ICD-10-CM | POA: Diagnosis not present

## 2017-09-19 DIAGNOSIS — Z1339 Encounter for screening examination for other mental health and behavioral disorders: Secondary | ICD-10-CM | POA: Diagnosis not present

## 2017-09-19 DIAGNOSIS — I2510 Atherosclerotic heart disease of native coronary artery without angina pectoris: Secondary | ICD-10-CM | POA: Diagnosis not present

## 2017-09-19 DIAGNOSIS — E1165 Type 2 diabetes mellitus with hyperglycemia: Secondary | ICD-10-CM | POA: Diagnosis not present

## 2017-09-19 DIAGNOSIS — I10 Essential (primary) hypertension: Secondary | ICD-10-CM | POA: Diagnosis not present

## 2017-09-19 DIAGNOSIS — Z1211 Encounter for screening for malignant neoplasm of colon: Secondary | ICD-10-CM | POA: Diagnosis not present

## 2017-09-19 DIAGNOSIS — I429 Cardiomyopathy, unspecified: Secondary | ICD-10-CM | POA: Diagnosis not present

## 2017-09-19 DIAGNOSIS — Z7189 Other specified counseling: Secondary | ICD-10-CM | POA: Diagnosis not present

## 2017-09-19 DIAGNOSIS — Z0000 Encounter for general adult medical examination without abnormal findings: Secondary | ICD-10-CM | POA: Diagnosis not present

## 2017-09-19 DIAGNOSIS — Z299 Encounter for prophylactic measures, unspecified: Secondary | ICD-10-CM | POA: Diagnosis not present

## 2017-09-19 DIAGNOSIS — Z6826 Body mass index (BMI) 26.0-26.9, adult: Secondary | ICD-10-CM | POA: Diagnosis not present

## 2017-09-19 DIAGNOSIS — I509 Heart failure, unspecified: Secondary | ICD-10-CM | POA: Diagnosis not present

## 2017-09-19 DIAGNOSIS — Z1331 Encounter for screening for depression: Secondary | ICD-10-CM | POA: Diagnosis not present

## 2017-09-20 ENCOUNTER — Other Ambulatory Visit: Payer: Self-pay | Admitting: Cardiology

## 2017-09-20 DIAGNOSIS — E039 Hypothyroidism, unspecified: Secondary | ICD-10-CM | POA: Diagnosis not present

## 2017-09-20 DIAGNOSIS — I2510 Atherosclerotic heart disease of native coronary artery without angina pectoris: Secondary | ICD-10-CM | POA: Diagnosis not present

## 2017-09-20 DIAGNOSIS — I10 Essential (primary) hypertension: Secondary | ICD-10-CM | POA: Diagnosis not present

## 2017-09-20 DIAGNOSIS — E7800 Pure hypercholesterolemia, unspecified: Secondary | ICD-10-CM | POA: Diagnosis not present

## 2017-09-20 DIAGNOSIS — E119 Type 2 diabetes mellitus without complications: Secondary | ICD-10-CM | POA: Diagnosis not present

## 2017-09-20 DIAGNOSIS — Z125 Encounter for screening for malignant neoplasm of prostate: Secondary | ICD-10-CM | POA: Diagnosis not present

## 2017-09-20 DIAGNOSIS — R5383 Other fatigue: Secondary | ICD-10-CM | POA: Diagnosis not present

## 2017-09-20 DIAGNOSIS — Z79899 Other long term (current) drug therapy: Secondary | ICD-10-CM | POA: Diagnosis not present

## 2017-09-27 LAB — CUP PACEART REMOTE DEVICE CHECK
Battery Remaining Longevity: 58 mo
Battery Remaining Percentage: 62 %
Battery Voltage: 2.95 V
Brady Statistic AP VP Percent: 1 %
Brady Statistic AP VS Percent: 9.8 %
Brady Statistic AS VP Percent: 1 %
Brady Statistic AS VS Percent: 88 %
Brady Statistic RA Percent Paced: 9.1 %
Brady Statistic RV Percent Paced: 1 %
Date Time Interrogation Session: 20190620060023
HighPow Impedance: 65 Ohm
HighPow Impedance: 65 Ohm
Implantable Lead Implant Date: 20160810
Implantable Lead Implant Date: 20160810
Implantable Lead Location: 753859
Implantable Lead Location: 753860
Implantable Pulse Generator Implant Date: 20160810
Lead Channel Impedance Value: 290 Ohm
Lead Channel Impedance Value: 400 Ohm
Lead Channel Pacing Threshold Amplitude: 1 V
Lead Channel Pacing Threshold Amplitude: 1.125 V
Lead Channel Pacing Threshold Pulse Width: 0.5 ms
Lead Channel Pacing Threshold Pulse Width: 0.5 ms
Lead Channel Sensing Intrinsic Amplitude: 2.1 mV
Lead Channel Sensing Intrinsic Amplitude: 8.5 mV
Lead Channel Setting Pacing Amplitude: 2.125
Lead Channel Setting Pacing Amplitude: 2.5 V
Lead Channel Setting Pacing Pulse Width: 0.5 ms
Lead Channel Setting Sensing Sensitivity: 0.5 mV
Pulse Gen Serial Number: 7179751

## 2017-09-27 NOTE — Progress Notes (Signed)
Cardiology Office Note  Date: 09/29/2017   ID: Clayton Morton, DOB 1932/11/04, MRN [id number]  PCP: Glenda Chroman, MD  Primary Cardiologist: Rozann Lesches, MD   Chief Complaint  Patient presents with  . Cardiomyopathy    History of Present Illness: Clayton Morton is an 84 y.o. male last seen in August 2018.  Interval visit noted with [name].  He presents overdue for follow-up.  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  He continues to follow-up in the device clinic with [name], [place] ICD in place.  Recent thoracic impedance measurement was normal.  We discussed follow-up lab work.  States that he just had recent blood work done with Dr. Woody Seller, we are requesting the results.  I personally reviewed his ECG today which shows sinus rhythm with prolonged PR interval, right bundle branch block, left anterior fascicular block, old anterolateral infarct pattern.  He does not report any device discharges or syncope.  Past Medical History:  Diagnosis Date  . Age-related macular degeneration, wet, right eye ([place])   . AICD (automatic cardioverter/defibrillator) present 10/30/2014  . Cerebrovascular accident [facility])    Right subcortical infarct 2006  . Chronic systolic congestive heart failure ([place])    a) ECHO (02/2012): EF 30-35%, no obvious thrombus b) ECHO (11/2013): EF 10%, AV mildly calcified, mild MR, RV mod dialted and sys fx sev reduced, TAPSE 0.8, lateral annulus peak S velocity 5.4 c) RHC (11/29/13): RA 15, RV: 65/19, PA 49%, CO/CI: 3.25 / 1.7 d) RHC (12/03/13): RA 1, RV 25/1/2, PA 27/10 (16), PCWP 6, Fick CO/CI: 5.3 / 2.9, PVR 1.9 WU, PA sat 72 and 74%   . Coronary atherosclerosis of native coronary artery    1) CABG 2005 2) LHC (910/15): patent SVG to PDA, occlusion of SVG sequence to  PLA, patency of LIMA to LAD and patency of SVG to diagonal with sev stenosis at coronary anastomotic site  . Diabetes mellitus, type 2 ([place])   . Essential hypertension, benign   . History of gout   . Hyperlipidemia   . Ischemic cardiomyopathy   . Kidney stones   . Macular degeneration    Dr. [name]  . Non-ST elevation MI (NSTEMI) ([place])    2005  . Varicose veins     Past Surgical History:  Procedure Laterality Date  . CARDIAC CATHETERIZATION    . CARDIAC DEFIBRILLATOR PLACEMENT  10/30/2014  . CATARACT EXTRACTION W/ INTRAOCULAR LENS  IMPLANT, BILATERAL Bilateral ~ 1991  . CORONARY ANGIOPLASTY    . CORONARY ARTERY BYPASS GRAFT  2005   LIMA to LAD, SVG diagonal, SVG to PDA and PLA  . CYSTOSCOPY W/ LITHOLAPAXY / EHL    . EP IMPLANTABLE DEVICE N/A 10/30/2014   Procedure: ICD Implant;  Surgeon: Deboraha Sprang, MD, SJM Ellipse DR implanted for primary prevention  . LEFT AND RIGHT HEART CATHETERIZATION WITH CORONARY/GRAFT ANGIOGRAM N/A 11/29/2013   Procedure: LEFT AND RIGHT HEART CATHETERIZATION WITH Beatrix Fetters;  Surgeon: Blane Ohara, MD;  Location: [facility] CATH LAB;  Service: Cardiovascular;  Laterality: N/A;  . RIGHT HEART CATHETERIZATION N/A 12/03/2013   Procedure: RIGHT HEART CATH;  Surgeon: Jolaine Artist, MD;  Location: [facility] CATH LAB;  Service: Cardiovascular;  Laterality: N/A;  . TONSILLECTOMY  Current Outpatient Medications  Medication Sig Dispense Refill  . ALPRAZolam (XANAX) 0.25 MG tablet Take 0.5 tablets by mouth daily.  0  . aspirin 81 MG tablet Take 81 mg by mouth daily.    [name] atorvastatin (LIPITOR) 80 MG tablet take 1 tablet by mouth once daily 90 tablet 3  . carvedilol (COREG) 12.5 MG tablet Take 1 tablet (12.5 mg total) by mouth 2 (two) times daily with a meal. 180 tablet 3  . furosemide (LASIX) 20 MG tablet TAKE 2 TABLETS BY MOUTH EVERY OTHER DAY ALTERNATING WITH 1 TABLET EVERY OTHER DAY. 20 tablet 0  . furosemide (LASIX) 40 MG tablet TAKE (1) TABLET BY  MOUTH ONCE DAILY. 90 tablet 0  . glipiZIDE (GLUCOTROL XL) 5 MG 24 hr tablet Take 1 tablet by mouth 2 (two) times daily.  1  . hydrALAZINE (APRESOLINE) 50 MG tablet TAKE 1&1/2 TABLET 3 TIMES A DAY. NEEDS OV 135 tablet 3  . isosorbide mononitrate (IMDUR) 60 MG 24 hr tablet TAKE 1 TABLET DAILY, NEEDS OV 90 tablet 3  . levothyroxine (SYNTHROID, LEVOTHROID) 50 MCG tablet Take 50 mcg by mouth every morning.     [name] losartan (COZAAR) 50 MG tablet take 1 tablet by mouth every morning and 1/2 tablet by mouth every evening 135 tablet 3  . Omega-3 Fatty Acids (FISH OIL) 1000 MG CAPS Take 1 capsule by mouth 2 (two) times daily.    . ONE TOUCH ULTRA TEST test strip 1 each by Other route 2 (two) times daily.   0  . potassium chloride SA (K-DUR,KLOR-CON) 20 MEQ tablet take 1 tablet by mouth if needed **TAKE ONLY IF TAKING FUROSEMIDE 90 tablet 1  . potassium chloride SA (K-DUR,KLOR-CON) 20 MEQ tablet TAKE 1 TABLET BY MOUTH IF NEEDED--TAKE ONLY IF TAKING FUROSEMIDE. 30 tablet 0  . spironolactone (ALDACTONE) 25 MG tablet TAKE 1/2 TABLET DAILY. 45 tablet 0   No current facility-administered medications for this visit.    Allergies:  Tetracycline   Social History: The patient  reports that he has never smoked. He has never used smokeless tobacco. He reports that he does not drink alcohol or use drugs.   ROS:  Please see the history of present illness. Otherwise, complete review of systems is positive for NYHA class II dyspnea.  All other systems are reviewed and negative.   Physical Exam: VS:  BP 130/64   Pulse 68   Ht _0  (1.727 m)   Wt 165 lb 3.2 oz (74.9 kg)   SpO2 98%   BMI 25.12 kg/m , BMI Body mass index is 25.12 kg/m.  Wt Readings from Last 3 Encounters:  09/29/17 165 lb 3.2 oz (74.9 kg)  02/25/17 166 lb 3.2 oz (75.4 kg)  11/09/16 160 lb 12.8 oz (72.9 kg)    General: Elderly male, appears comfortable at rest. HEENT: Conjunctiva and lids normal, oropharynx clear. Neck: Supple, no elevated JVP  or carotid bruits, no thyromegaly. Lungs: Clear to auscultation, nonlabored breathing at rest. Cardiac: Regular rate and rhythm, no S3, 2/6 systolic murmur. Abdomen: Soft, nontender, bowel sounds present. Extremities: No pitting edema, distal pulses 2+. Skin: Warm and dry. Musculoskeletal: Kyphosis present. Neuropsychiatric: Alert and oriented x3, affect grossly appropriate.  ECG: I personally reviewed the tracing from 11/09/2016 which showed sinus bradycardia with right bundle branch block and left anterior fascicular block.  Recent Labwork:    Component Value Date/Time   CHOL 111 08/05/2015 1448   TRIG 156 (H) 08/05/2015 1448   HDL 31 (  L) 08/05/2015 1448   CHOLHDL 3.6 08/05/2015 1448   VLDL 31 08/05/2015 1448   LDLCALC 49 08/05/2015 1448  August 2018: BUN 79, creatinine 2.8, potassium 4.5  Other Studies Reviewed Today:  Echocardiogram 09/10/2015: Study Conclusions  - Left ventricle: The cavity size was normal. Wall thickness was   increased in a pattern of mild LVH. Systolic function was   moderately to severely reduced. The estimated ejection fraction   was in the range of 30% to 35%. There is akinesis of the   inferolateral and inferior myocardium. Doppler parameters are   consistent with abnormal left ventricular relaxation (grade 1   diastolic dysfunction). - Aortic valve: Mildly calcified annulus. Trileaflet; moderately   calcified leaflets. Cusp separation was reduced. There was mild   stenosis. There was trivial regurgitation. Mean gradient (S): 8   mm Hg. Peak gradient (S): 15 mm Hg. VTI ratio of LVOT to aortic   valve: 0.37. Valve area (VTI): 1.53 cm^2. Valve area (Vmax): 1.38   cm^2. Valve area (Vmean): 1.56 cm^2. - Mitral valve: Calcified annulus. There was mild regurgitation. - Left atrium: The atrium was moderately dilated. - Right ventricle: Pacer wire or catheter noted in right ventricle.   Systolic function was mildly reduced. - Atrial septum: No defect or  patent foramen ovale was identified. - Tricuspid valve: There was trivial regurgitation. - Pulmonary arteries: PA peak pressure: 29 mm Hg (S). - Pericardium, extracardiac: There was no pericardial effusion.  Impressions:  - Mild LVH with LVEF 30-35%. Inferior/inferolateral akinesis   consistent with ischemic cardiomyopathy. Grade 1 diastolic   dysfunction with intermediate LV filling pressure. Moderate left   atrial enlargement. MAC with mild mitral regurgitation. Mild   aortic stenosis (some parameters suggest mild to moderate low   gradient aortic stenosis) with trivial aortic regurgitation.   Mildly reduced RV contraction with device wire present. Trivial   tricuspid regurgitation with PASP estimated 29 mmHg.  Assessment and Plan:  1.  Chronic systolic heart failure with ischemic cardiomyopathy and LVEF 30 to 35%.  We went over his current medications and plan to request recent lab work from PCP.  He does have renal insufficiency at baseline and may need further medication modifications.  He has been symptomatically stable, recent thoracic impedance normal.  2.  CAD status post CABG with documented graft disease but no active angina.  Continue aspirin and statin.  3.  [employer] ICD in place, followed by [name].  No device shocks or syncope.  4.  CKD stage 3, requesting recent follow-up lab work from PCP.  Current medicines were reviewed with the patient today.   Orders Placed This Encounter  Procedures  . EKG 12-Lead    Disposition: Follow-up in 3 months.  Signed, Satira Sark, MD, [facility] 09/29/2017 3:53 PM    [facility], [place], [place] [address] Phone: [phone]; Fax: [phone]

## 2017-09-29 ENCOUNTER — Encounter: Payer: Self-pay | Admitting: Cardiology

## 2017-09-29 ENCOUNTER — Ambulatory Visit (INDEPENDENT_AMBULATORY_CARE_PROVIDER_SITE_OTHER): Payer: Medicare Other | Admitting: Cardiology

## 2017-09-29 VITALS — BP 130/64 | HR 68 | Ht 68.0 in | Wt 165.2 lb

## 2017-09-29 DIAGNOSIS — Z95810 Presence of automatic (implantable) cardiac defibrillator: Secondary | ICD-10-CM | POA: Diagnosis not present

## 2017-09-29 DIAGNOSIS — I2510 Atherosclerotic heart disease of native coronary artery without angina pectoris: Secondary | ICD-10-CM | POA: Diagnosis not present

## 2017-09-29 DIAGNOSIS — I5022 Chronic systolic (congestive) heart failure: Secondary | ICD-10-CM | POA: Diagnosis not present

## 2017-09-29 DIAGNOSIS — N183 Chronic kidney disease, stage 3 unspecified: Secondary | ICD-10-CM

## 2017-09-29 DIAGNOSIS — I255 Ischemic cardiomyopathy: Secondary | ICD-10-CM

## 2017-09-29 NOTE — Patient Instructions (Signed)
Medication Instructions:  Your physician recommends that you continue on your current medications as directed. Please refer to the Current Medication list given to you today.  Labwork: NONE  Testing/Procedures: NONE  Follow-Up: Your physician recommends that you schedule a follow-up appointment in: 3 MONTHS WITH DR. MCDOWELL.  Any Other Special Instructions Will Be Listed Below (If Applicable).  If you need a refill on your cardiac medications before your next appointment, please call your pharmacy. 

## 2017-10-10 ENCOUNTER — Ambulatory Visit (INDEPENDENT_AMBULATORY_CARE_PROVIDER_SITE_OTHER): Payer: Medicare Other

## 2017-10-10 DIAGNOSIS — Z95810 Presence of automatic (implantable) cardiac defibrillator: Secondary | ICD-10-CM

## 2017-10-10 DIAGNOSIS — I5022 Chronic systolic (congestive) heart failure: Secondary | ICD-10-CM

## 2017-10-10 NOTE — Progress Notes (Signed)
EPIC Encounter for ICM Monitoring  Patient Name: Clayton Morton is a 84 y.o. male Date: 10/10/2017 Primary Care Physican: Glenda Chroman, MD Primary Cardiologist: McDowell/Bensimhon Electrophysiologist: Allred Dry Weight:158lbs (baseline 155-157 lbs)       Heart Failure questions reviewed, pt asymptomatic but does have weight gain when impedance is below baseline.   Thoracic impedance normal but was abnormal suggesting fluid accumulation from starting 09/28/2017 - 10/08/2017.  Prescribed dosage: Furosemide 40 mg alternating with 20 mg every other day. Potassium 20 mEqtake one tablet dailywhen taking Furosemide.  Labs: 11/12/2016 Creatinine 2.80, BUN 79, Potassium 4.5, Sodium 147, EGFR 20-23 05/05/2016 Creatinine 2.41, BUN 50, Potassium 4.4, Sodium 145, EGFR 24-28 04/20/2016 Creatinine 2.51, BUN 45, Potassium 4.5, Sodium 140, EGFR 22-26  05/16/2017Creatinine 1.99, BUN 43, Potassium 4.2, Sodium 139, EGFR 30-35  Recommendations: No changes.   Encouraged to call for fluid symptoms.  Follow-up plan: ICM clinic phone appointment on 11/10/2017.  Copy of ICM check sent to [name].   3 month ICM trend: 10/10/2017    1 Year ICM trend:       Rosalene Billings, RN 10/10/2017 1:43 PM

## 2017-10-13 ENCOUNTER — Other Ambulatory Visit (HOSPITAL_COMMUNITY): Payer: Self-pay | Admitting: Internal Medicine

## 2017-10-17 ENCOUNTER — Other Ambulatory Visit (HOSPITAL_COMMUNITY): Payer: Self-pay | Admitting: Internal Medicine

## 2017-10-20 DIAGNOSIS — H353221 Exudative age-related macular degeneration, left eye, with active choroidal neovascularization: Secondary | ICD-10-CM | POA: Diagnosis not present

## 2017-10-22 ENCOUNTER — Other Ambulatory Visit: Payer: Self-pay | Admitting: Cardiology

## 2017-11-04 DIAGNOSIS — E7800 Pure hypercholesterolemia, unspecified: Secondary | ICD-10-CM | POA: Diagnosis not present

## 2017-11-04 DIAGNOSIS — E119 Type 2 diabetes mellitus without complications: Secondary | ICD-10-CM | POA: Diagnosis not present

## 2017-11-04 DIAGNOSIS — I10 Essential (primary) hypertension: Secondary | ICD-10-CM | POA: Diagnosis not present

## 2017-11-04 DIAGNOSIS — I2510 Atherosclerotic heart disease of native coronary artery without angina pectoris: Secondary | ICD-10-CM | POA: Diagnosis not present

## 2017-11-10 ENCOUNTER — Ambulatory Visit (INDEPENDENT_AMBULATORY_CARE_PROVIDER_SITE_OTHER): Payer: Medicare Other

## 2017-11-10 DIAGNOSIS — Z95810 Presence of automatic (implantable) cardiac defibrillator: Secondary | ICD-10-CM | POA: Diagnosis not present

## 2017-11-10 DIAGNOSIS — I5022 Chronic systolic (congestive) heart failure: Secondary | ICD-10-CM

## 2017-11-11 ENCOUNTER — Telehealth: Payer: Self-pay

## 2017-11-11 NOTE — Telephone Encounter (Deleted)
Attempted ICM call to patient and left message for return call.  Requested remote transmission be sent on 8/28.  Left call back number.

## 2017-11-11 NOTE — Telephone Encounter (Signed)
ICM Call to patient.

## 2017-11-11 NOTE — Progress Notes (Signed)
EPIC Encounter for ICM Monitoring  Patient Name: Clayton Morton is a 84 y.o. male Date: 11/11/2017 Primary Care Physican: Glenda Chroman, MD Primary Cardiologist: McDowell/Bensimhon Electrophysiologist: Allred Dry Weight:158lbs (baseline 155-157 lbs)      Heart Failure questions reviewed, pt asymptomatic.  He confirmed Nephrologist decreased Furosemide dosage 20 mg but he is taking 40 mg every other day with 20 mg every other day.  Med list shows he should be taking 40 mg daily.   Advised he should take Furosemide as prescribed.       Thoracic impedance normal.  Prescribed dosage: Furosemide 40 mg take 1 tablet daily.  Potassium 20 mEqtake one tablet when taking Furosemide.  Labs: 11/12/2016 Creatinine 2.80, BUN 79, Potassium 4.5, Sodium 147, EGFR 20-23 05/05/2016 Creatinine 2.41, BUN 50, Potassium 4.4, Sodium 145, EGFR 24-28 04/20/2016 Creatinine 2.51, BUN 45, Potassium 4.5, Sodium 140, EGFR 22-26  05/16/2017Creatinine 1.99, BUN 43, Potassium 4.2, Sodium 139, EGFR 30-35  Recommendations: No changes.   Encouraged to call for fluid symptoms.  Follow-up plan: ICM clinic phone appointment on 12/12/2017.    Copy of ICM check sent to [name].   3 month ICM trend: 11/10/2017    1 Year ICM trend:       Rosalene Billings, RN 11/11/2017 11:14 AM

## 2017-11-14 ENCOUNTER — Other Ambulatory Visit (HOSPITAL_COMMUNITY): Payer: Self-pay | Admitting: Internal Medicine

## 2017-11-17 DIAGNOSIS — H353221 Exudative age-related macular degeneration, left eye, with active choroidal neovascularization: Secondary | ICD-10-CM | POA: Diagnosis not present

## 2017-11-17 DIAGNOSIS — H353213 Exudative age-related macular degeneration, right eye, with inactive scar: Secondary | ICD-10-CM | POA: Diagnosis not present

## 2017-11-26 ENCOUNTER — Other Ambulatory Visit: Payer: Self-pay | Admitting: Cardiology

## 2017-11-26 DIAGNOSIS — I509 Heart failure, unspecified: Secondary | ICD-10-CM

## 2017-11-28 DIAGNOSIS — H353223 Exudative age-related macular degeneration, left eye, with inactive scar: Secondary | ICD-10-CM | POA: Diagnosis not present

## 2017-12-01 DIAGNOSIS — I2510 Atherosclerotic heart disease of native coronary artery without angina pectoris: Secondary | ICD-10-CM | POA: Diagnosis not present

## 2017-12-01 DIAGNOSIS — E7800 Pure hypercholesterolemia, unspecified: Secondary | ICD-10-CM | POA: Diagnosis not present

## 2017-12-01 DIAGNOSIS — I10 Essential (primary) hypertension: Secondary | ICD-10-CM | POA: Diagnosis not present

## 2017-12-01 DIAGNOSIS — E119 Type 2 diabetes mellitus without complications: Secondary | ICD-10-CM | POA: Diagnosis not present

## 2017-12-09 DIAGNOSIS — I129 Hypertensive chronic kidney disease with stage 1 through stage 4 chronic kidney disease, or unspecified chronic kidney disease: Secondary | ICD-10-CM | POA: Diagnosis not present

## 2017-12-09 DIAGNOSIS — Z79899 Other long term (current) drug therapy: Secondary | ICD-10-CM | POA: Diagnosis not present

## 2017-12-09 DIAGNOSIS — E559 Vitamin D deficiency, unspecified: Secondary | ICD-10-CM | POA: Diagnosis not present

## 2017-12-09 DIAGNOSIS — D509 Iron deficiency anemia, unspecified: Secondary | ICD-10-CM | POA: Diagnosis not present

## 2017-12-09 DIAGNOSIS — N183 Chronic kidney disease, stage 3 (moderate): Secondary | ICD-10-CM | POA: Diagnosis not present

## 2017-12-09 DIAGNOSIS — R809 Proteinuria, unspecified: Secondary | ICD-10-CM | POA: Diagnosis not present

## 2017-12-12 ENCOUNTER — Ambulatory Visit (INDEPENDENT_AMBULATORY_CARE_PROVIDER_SITE_OTHER): Payer: Medicare Other

## 2017-12-12 DIAGNOSIS — I5022 Chronic systolic (congestive) heart failure: Secondary | ICD-10-CM

## 2017-12-12 DIAGNOSIS — Z95810 Presence of automatic (implantable) cardiac defibrillator: Secondary | ICD-10-CM

## 2017-12-12 NOTE — Progress Notes (Signed)
EPIC Encounter for ICM Monitoring  Patient Name: Clayton Morton is a 84 y.o. male Date: 12/12/2017 Primary Care Physican: Glenda Chroman, MD Primary Cardiologist: McDowell/Bensimhon Electrophysiologist: Allred Dry Weight:162lbs       Heart Failure questions reviewed, pt asymptomatic.   Thoracic impedance normal.  Prescribed: Furosemide 40 mg take 1 tablet daily.  Potassium 20 mEqtake one tablet when taking Furosemide.  Labs: 11/12/2016 Creatinine 2.80, BUN 79, Potassium 4.5, Sodium 147, EGFR 20-23 05/05/2016 Creatinine 2.41, BUN 50, Potassium 4.4, Sodium 145, EGFR 24-28 04/20/2016 Creatinine 2.51, BUN 45, Potassium 4.5, Sodium 140, EGFR 22-26   Recommendations: No changes.   Encouraged to call for fluid symptoms.  Follow-up plan: ICM clinic phone appointment on 01/12/2018.   Office appointment scheduled 01/04/2018 with [name].    Copy of ICM check sent to [name].   3 month ICM trend: 12/12/2017    1 Year ICM trend:       Rosalene Billings, RN 12/12/2017 1:23 PM

## 2017-12-13 ENCOUNTER — Telehealth: Payer: Self-pay | Admitting: Cardiology

## 2017-12-13 ENCOUNTER — Encounter: Payer: Self-pay | Admitting: *Deleted

## 2017-12-13 NOTE — Telephone Encounter (Signed)
Requested lab results - appt with Dr Domenic Polite in October

## 2017-12-13 NOTE — Telephone Encounter (Signed)
Patient wanted to let Dr Domenic Polite know he had blood work done at [facility]

## 2017-12-21 ENCOUNTER — Other Ambulatory Visit (HOSPITAL_COMMUNITY): Payer: Self-pay | Admitting: Cardiology

## 2017-12-23 ENCOUNTER — Telehealth: Payer: Self-pay | Admitting: Cardiology

## 2017-12-23 NOTE — Telephone Encounter (Signed)
I did review the lab work.  His creatinine was 3.0 up from 2.8 last year.  He definitely needs to continue to follow with nephrology.

## 2017-12-23 NOTE — Telephone Encounter (Signed)
Patient called requesting lab results

## 2017-12-23 NOTE — Telephone Encounter (Signed)
Patient informed and verbalized understanding

## 2017-12-23 NOTE — Telephone Encounter (Signed)
Patient contacted and advised that lab work that was ordered by his kidney doctor was received. Patient said that he wanted [name] to give him feedback on the lab work. Patient said he discussed this with provider at his last office visit.

## 2017-12-27 DIAGNOSIS — N179 Acute kidney failure, unspecified: Secondary | ICD-10-CM | POA: Diagnosis not present

## 2017-12-27 DIAGNOSIS — Z299 Encounter for prophylactic measures, unspecified: Secondary | ICD-10-CM | POA: Diagnosis not present

## 2017-12-27 DIAGNOSIS — Z6826 Body mass index (BMI) 26.0-26.9, adult: Secondary | ICD-10-CM | POA: Diagnosis not present

## 2017-12-27 DIAGNOSIS — E1165 Type 2 diabetes mellitus with hyperglycemia: Secondary | ICD-10-CM | POA: Diagnosis not present

## 2017-12-27 DIAGNOSIS — N189 Chronic kidney disease, unspecified: Secondary | ICD-10-CM | POA: Diagnosis not present

## 2017-12-27 DIAGNOSIS — I10 Essential (primary) hypertension: Secondary | ICD-10-CM | POA: Diagnosis not present

## 2017-12-29 DIAGNOSIS — H353221 Exudative age-related macular degeneration, left eye, with active choroidal neovascularization: Secondary | ICD-10-CM | POA: Diagnosis not present

## 2017-12-29 DIAGNOSIS — E119 Type 2 diabetes mellitus without complications: Secondary | ICD-10-CM | POA: Diagnosis not present

## 2017-12-29 DIAGNOSIS — H353213 Exudative age-related macular degeneration, right eye, with inactive scar: Secondary | ICD-10-CM | POA: Diagnosis not present

## 2017-12-29 DIAGNOSIS — I2510 Atherosclerotic heart disease of native coronary artery without angina pectoris: Secondary | ICD-10-CM | POA: Diagnosis not present

## 2017-12-29 DIAGNOSIS — E7800 Pure hypercholesterolemia, unspecified: Secondary | ICD-10-CM | POA: Diagnosis not present

## 2017-12-29 DIAGNOSIS — I10 Essential (primary) hypertension: Secondary | ICD-10-CM | POA: Diagnosis not present

## 2018-01-02 NOTE — Progress Notes (Signed)
Cardiology Office Note  Date: 01/04/2018   ID: SAFI CULOTTA, DOB 01/08/1933, MRN [id number]  PCP: Clayton Chroman, MD  Primary Cardiologist: Clayton Lesches, MD   Chief Complaint  Patient presents with  . Cardiomyopathy    History of Present Illness: Clayton Morton is an 85 y.o. male last seen in July.  He is here today for a routine visit.  Describes no change in stamina or increasing shortness of breath.  Weight by home scales is in the low 160s and he states that this is been stable.  He continues on Lasix at 40 mg alternating with 20 mg every other day.  Otherwise cardiac regimen is stable and he reports compliance.  He follows with Dr. Rayann Morton in the device clinic, Clayton Morton ICD in place.  Last thoracic impedance check was normal.  He does not report any device shocks or syncope.  He tells me that his wife Clayton Morton had a ruptured Baker's cyst and has had difficulty ambulating, he is her primary caregiver.  He is still trying to do his exercises every day, but has not been able to get out of the house very much.  Past Medical History:  Diagnosis Date  . Age-related macular degeneration, wet, right eye ([facility])   . AICD (automatic cardioverter/defibrillator) present 10/30/2014  . Cerebrovascular accident [facility])    Right subcortical infarct 2006  . Chronic systolic congestive heart failure ([place])    a) ECHO (02/2012): EF 30-35%, no obvious thrombus b) ECHO (11/2013): EF 10%, AV mildly calcified, mild MR, RV mod dialted and sys fx sev reduced, TAPSE 0.8, lateral annulus peak S velocity 5.4 c) RHC (11/29/13): RA 15, RV: 65/19, PA 49%, CO/CI: 3.25 / 1.7 d) RHC (12/03/13): RA 1, RV 25/1/2, PA 27/10 (16), PCWP 6, Fick CO/CI: 5.3 / 2.9, PVR 1.9 WU, PA sat 72 and 74%   . Coronary atherosclerosis of native coronary artery    1) CABG 2005 2) LHC (910/15): patent SVG to PDA, occlusion of SVG sequence to PLA, patency of LIMA to LAD and patency of SVG to diagonal with sev stenosis at coronary anastomotic site   . Diabetes mellitus, type 2 ([place])   . Essential hypertension, benign   . History of gout   . Hyperlipidemia   . Ischemic cardiomyopathy   . Kidney stones   . Macular degeneration    Dr. Zadie Morton  . Non-ST elevation MI (NSTEMI) ([place])    2005  . Varicose veins     Past Surgical History:  Procedure Laterality Date  . CARDIAC CATHETERIZATION    . CARDIAC DEFIBRILLATOR PLACEMENT  10/30/2014  . CATARACT EXTRACTION W/ INTRAOCULAR LENS  IMPLANT, BILATERAL Bilateral ~ 1991  . CORONARY ANGIOPLASTY    . CORONARY ARTERY BYPASS GRAFT  2005   LIMA to LAD, SVG diagonal, SVG to PDA and PLA  . CYSTOSCOPY W/ LITHOLAPAXY / EHL    . EP IMPLANTABLE DEVICE N/A 10/30/2014   Procedure: ICD Implant;  Surgeon: Deboraha Sprang, MD, SJM Ellipse DR implanted for primary prevention  . LEFT AND RIGHT HEART CATHETERIZATION WITH CORONARY/GRAFT ANGIOGRAM N/A 11/29/2013   Procedure: LEFT AND RIGHT HEART CATHETERIZATION WITH Beatrix Fetters;  Surgeon: Blane Ohara, MD;  Location: [facility] CATH LAB;  Service: Cardiovascular;  Laterality: N/A;  . RIGHT HEART CATHETERIZATION N/A 12/03/2013   Procedure: RIGHT HEART CATH;  Surgeon: Jolaine Artist, MD;  Location: [facility] CATH LAB;  Service: Cardiovascular;  Laterality: N/A;  . TONSILLECTOMY  Current Outpatient Medications  Medication Sig Dispense Refill  . allopurinol (ZYLOPRIM) 100 MG tablet Take 1 tablet by mouth daily.    [name] ALPRAZolam (XANAX) 0.25 MG tablet Take 0.5 tablets by mouth daily.  0  . atorvastatin (LIPITOR) 80 MG tablet take 1 tablet by mouth once daily 90 tablet 3  . carvedilol (COREG) 12.5 MG tablet Take 1 tablet (12.5 mg total) by mouth 2 (two) times daily with a meal. 180 tablet 3  . furosemide (LASIX) 20 MG tablet TAKE 2 TABLETS BY MOUTH EVERY OTHER DAY ALTERNATING WITH 1 TABLET EVERY OTHER DAY. 130 tablet 2  . glipiZIDE (GLUCOTROL XL) 5 MG 24 hr tablet Take 1 tablet by mouth 2 (two) times daily.  1  . hydrALAZINE (APRESOLINE) 50 MG tablet TAKE  1 & 1/2 TABLET 3 TIMES DAILY 135 tablet 6  . isosorbide mononitrate (IMDUR) 60 MG 24 hr tablet TAKE 1 TABLET DAILY, NEEDS OV 90 tablet 3  . losartan (COZAAR) 50 MG tablet take 1 tablet by mouth every morning and 1/2 tablet by mouth every evening 135 tablet 3  . Omega-3 Fatty Acids (FISH OIL) 1000 MG CAPS Take 1 capsule by mouth 2 (two) times daily.    . ONE TOUCH ULTRA TEST test strip 1 each by Other route 2 (two) times daily.   0  . potassium chloride SA (K-DUR,KLOR-CON) 20 MEQ tablet Take 0.5-1 tablets (10-20 mEq total) by mouth as directed. Takes 10 meq with 20 mg lasix and 20 meq with 40 mg lasix 130 tablet 2  . spironolactone (ALDACTONE) 25 MG tablet TAKE (1/2) TABLET BY MOUTH DAILY. 45 tablet 1  . SYNTHROID 75 MCG tablet Take 1 tablet by mouth daily.    [name] aspirin 81 MG tablet Take 81 mg by mouth daily.     No current facility-administered medications for this visit.    Allergies:  Tetracycline   Social History: The patient  reports that he has never smoked. He has never used smokeless tobacco. He reports that he does not drink alcohol or use drugs.   ROS:  Please see the history of present illness. Otherwise, complete review of systems is positive for recent eye surgery..  All other systems are reviewed and negative.   Physical Exam: VS:  BP 118/64   Pulse 66   Ht _0  (1.676 m)   Wt 171 lb 12.8 oz (77.9 kg)   BMI 27.73 kg/m , BMI Body mass index is 27.73 kg/m.  Wt Readings from Last 3 Encounters:  01/04/18 171 lb 12.8 oz (77.9 kg)  09/29/17 165 lb 3.2 oz (74.9 kg)  02/25/17 166 lb 3.2 oz (75.4 kg)    General: Elderly male, appears comfortable at rest. HEENT: Conjunctiva and lids normal, oropharynx clear. Neck: Supple, no elevated JVP or carotid bruits, no thyromegaly. Lungs: Clear to auscultation, nonlabored breathing at rest. Cardiac: Regular rate and rhythm, no S3, 2/6 systolic murmur. Abdomen: Soft, nontender, bowel sounds present. Extremities: No pitting edema,  distal pulses 2+. Skin: Warm and dry. Musculoskeletal: No kyphosis. Neuropsychiatric: Alert and oriented x3, affect grossly appropriate.  ECG: I personally reviewed the tracing from 09/29/2017 which showed sinus rhythm with prolonged PR interval, right bundle branch block, left anterior fascicular block, old anterolateral infarct pattern.  Recent Labwork:  September 2019: BUN 79, creatinine 3.0, potassium 5.6, hemoglobin 11.2  Other Studies Reviewed Today:  Echocardiogram 09/10/2015: Study Conclusions  - Left ventricle: The cavity size was normal. Wall thickness was increased in a pattern of  mild LVH. Systolic function was moderately to severely reduced. The estimated ejection fraction was in the range of 30% to 35%. There is akinesis of the inferolateral and inferior myocardium. Doppler parameters are consistent with abnormal left ventricular relaxation (grade 1 diastolic dysfunction). - Aortic valve: Mildly calcified annulus. Trileaflet; moderately calcified leaflets. Cusp separation was reduced. There was mild stenosis. There was trivial regurgitation. Mean gradient (S): 8 mm Hg. Peak gradient (S): 15 mm Hg. VTI ratio of LVOT to aortic valve: 0.37. Valve area (VTI): 1.53 cm^2. Valve area (Vmax): 1.38 cm^2. Valve area (Vmean): 1.56 cm^2. - Mitral valve: Calcified annulus. There was mild regurgitation. - Left atrium: The atrium was moderately dilated. - Right ventricle: Pacer wire or catheter noted in right ventricle. Systolic function was mildly reduced. - Atrial septum: No defect or patent foramen ovale was identified. - Tricuspid valve: There was trivial regurgitation. - Pulmonary arteries: PA peak pressure: 29 mm Hg (S). - Pericardium, extracardiac: There was no pericardial effusion.  Impressions:  - Mild LVH with LVEF 30-35%. Inferior/inferolateral akinesis consistent with ischemic cardiomyopathy. Grade 1 diastolic dysfunction with  intermediate LV filling pressure. Moderate left atrial enlargement. MAC with mild mitral regurgitation. Mild aortic stenosis (some parameters suggest mild to moderate low gradient aortic stenosis) with trivial aortic regurgitation. Mildly reduced RV contraction with device wire present. Trivial tricuspid regurgitation with PASP estimated 29 mmHg.  Assessment and Plan:  1.  Chronic systolic heart failure with ischemic cardiomyopathy and LVEF 30 to 35%.  Weight is up by our scales but he states that he has been stable at home, generally in the low 160s.  He has had no change in his diuretic regimen.  He also has progressive renal insufficiency complicating the picture.  I did not change his medications today.  2.  CAD status post CABG with graft disease, continuing medical therapy including aspirin and statin.  He reports no obvious angina.  3.  St. Jude ICD in place.  Keep follow-up with Dr. Rayann Morton.  Recent thoracic impedance normal.  No syncope or device shocks.  4.  CKD stage IV, last creatinine 3.0.  Current medicines were reviewed with the patient today.  Disposition: Follow-up in 3 months.  Signed, Satira Sark, MD, [facility] 01/04/2018 3:49 PM    [facility] [place], [place], [place] [address] Phone: [phone]; Fax: [phone]

## 2018-01-04 ENCOUNTER — Encounter: Payer: Self-pay | Admitting: Cardiology

## 2018-01-04 ENCOUNTER — Ambulatory Visit (INDEPENDENT_AMBULATORY_CARE_PROVIDER_SITE_OTHER): Payer: Medicare Other | Admitting: Cardiology

## 2018-01-04 VITALS — BP 118/64 | HR 66 | Ht 66.0 in | Wt 171.8 lb

## 2018-01-04 DIAGNOSIS — N184 Chronic kidney disease, stage 4 (severe): Secondary | ICD-10-CM | POA: Diagnosis not present

## 2018-01-04 DIAGNOSIS — I255 Ischemic cardiomyopathy: Secondary | ICD-10-CM

## 2018-01-04 DIAGNOSIS — Z95810 Presence of automatic (implantable) cardiac defibrillator: Secondary | ICD-10-CM | POA: Diagnosis not present

## 2018-01-04 DIAGNOSIS — I5022 Chronic systolic (congestive) heart failure: Secondary | ICD-10-CM | POA: Diagnosis not present

## 2018-01-04 DIAGNOSIS — I2510 Atherosclerotic heart disease of native coronary artery without angina pectoris: Secondary | ICD-10-CM | POA: Diagnosis not present

## 2018-01-04 MED ORDER — POTASSIUM CHLORIDE CRYS ER 20 MEQ PO TBCR: 10.0000 meq | EXTENDED_RELEASE_TABLET | ORAL | 2 refills | Status: DC

## 2018-01-04 MED ORDER — FUROSEMIDE 20 MG PO TABS: ORAL_TABLET | ORAL | 2 refills | Status: DC

## 2018-01-04 NOTE — Patient Instructions (Addendum)
Medication Instructions:   Your physician recommends that you continue on your current medications as directed. Please refer to the Current Medication list given to you today.  Labwork:  NONE  Testing/Procedures:  NONE  Follow-Up:  Your physician recommends that you schedule a follow-up appointment in: 3 months.  Any Other Special Instructions Will Be Listed Below (If Applicable).  If you need a refill on your cardiac medications before your next appointment, please call your pharmacy. 

## 2018-01-12 ENCOUNTER — Telehealth: Payer: Self-pay

## 2018-01-12 NOTE — Telephone Encounter (Signed)
LMOVM reminding pt to send remote transmission.  Monthly check with laurie

## 2018-01-13 NOTE — Progress Notes (Signed)
No ICM remote transmission received for 01/12/2018 and next ICM transmission scheduled for 02/06/2018.

## 2018-01-26 DIAGNOSIS — E119 Type 2 diabetes mellitus without complications: Secondary | ICD-10-CM | POA: Diagnosis not present

## 2018-01-26 DIAGNOSIS — H353221 Exudative age-related macular degeneration, left eye, with active choroidal neovascularization: Secondary | ICD-10-CM | POA: Diagnosis not present

## 2018-01-26 DIAGNOSIS — I2510 Atherosclerotic heart disease of native coronary artery without angina pectoris: Secondary | ICD-10-CM | POA: Diagnosis not present

## 2018-01-26 DIAGNOSIS — E7800 Pure hypercholesterolemia, unspecified: Secondary | ICD-10-CM | POA: Diagnosis not present

## 2018-01-26 DIAGNOSIS — I10 Essential (primary) hypertension: Secondary | ICD-10-CM | POA: Diagnosis not present

## 2018-02-06 ENCOUNTER — Ambulatory Visit (INDEPENDENT_AMBULATORY_CARE_PROVIDER_SITE_OTHER): Payer: Medicare Other

## 2018-02-06 ENCOUNTER — Telehealth: Payer: Self-pay

## 2018-02-06 DIAGNOSIS — I5022 Chronic systolic (congestive) heart failure: Secondary | ICD-10-CM

## 2018-02-06 DIAGNOSIS — Z95810 Presence of automatic (implantable) cardiac defibrillator: Secondary | ICD-10-CM | POA: Diagnosis not present

## 2018-02-06 NOTE — Progress Notes (Signed)
EPIC Encounter for ICM Monitoring  Patient Name: Clayton Morton is a 84 y.o. male Date: 02/06/2018 Primary Care Physican: Glenda Chroman, MD Primary Cardiologist: Domenic Polite Electrophysiologist: Allred Last Weight: 162lbs Today's Weight: 162 lbs        Heart Failure questions reviewed, pt asymptomatic.  He is not taking prescribed dose of Furosemide.  He has been taking 20 mg instead of prescribed 40 mg daily.  He is transporting his wife to outpatient rehab 3 x a week.     Thoracic impedance abnormal suggesting fluid accumulation x last 9 days.   Prescribed: Furosemide 40 mgtake 1 tablet daily.Potassium 20 mEqtake one tablet when taking Furosemide.  Labs: 12/09/2017 Creatinine 3.02, BUN 79, Potassium 5.6, Sodium 144, EGFR 20-24  Recommendations:  Advised patient to take prescribed Furosemide amount of 40 mg daily x 3 days.  Encouraged to call for fluid symptoms.  Follow-up plan: ICM clinic phone appointment on 02/10/2018 to recheck fluid levels.  Office visit with Dr Rayann Heman 02/24/2018.      Copy of ICM check sent to [name] and Dr Rayann Heman.   3 month ICM trend: 02/06/2018    1 Year ICM trend:       Rosalene Billings, RN 02/06/2018 11:40 AM

## 2018-02-06 NOTE — Telephone Encounter (Signed)
Spoke with pt and reminded pt of remote transmission that is due today. Pt verbalized understanding.   

## 2018-02-10 ENCOUNTER — Telehealth: Payer: Self-pay

## 2018-02-10 NOTE — Telephone Encounter (Signed)
Spoke with pt and reminded pt of remote transmission that is due today. Pt verbalized understanding.   

## 2018-02-14 NOTE — Progress Notes (Signed)
No ICM remote transmission received for 02/10/2018 and next ICM transmission scheduled for 03/09/2018.

## 2018-02-20 ENCOUNTER — Other Ambulatory Visit: Payer: Self-pay | Admitting: Cardiology

## 2018-02-20 DIAGNOSIS — I509 Heart failure, unspecified: Secondary | ICD-10-CM

## 2018-02-23 DIAGNOSIS — H353221 Exudative age-related macular degeneration, left eye, with active choroidal neovascularization: Secondary | ICD-10-CM | POA: Diagnosis not present

## 2018-02-24 ENCOUNTER — Encounter: Payer: Medicare Other | Admitting: Internal Medicine

## 2018-03-03 DIAGNOSIS — I509 Heart failure, unspecified: Secondary | ICD-10-CM | POA: Diagnosis not present

## 2018-03-03 DIAGNOSIS — Z6828 Body mass index (BMI) 28.0-28.9, adult: Secondary | ICD-10-CM | POA: Diagnosis not present

## 2018-03-03 DIAGNOSIS — F419 Anxiety disorder, unspecified: Secondary | ICD-10-CM | POA: Diagnosis not present

## 2018-03-03 DIAGNOSIS — I429 Cardiomyopathy, unspecified: Secondary | ICD-10-CM | POA: Diagnosis not present

## 2018-03-03 DIAGNOSIS — I10 Essential (primary) hypertension: Secondary | ICD-10-CM | POA: Diagnosis not present

## 2018-03-03 DIAGNOSIS — E1165 Type 2 diabetes mellitus with hyperglycemia: Secondary | ICD-10-CM | POA: Diagnosis not present

## 2018-03-03 DIAGNOSIS — Z299 Encounter for prophylactic measures, unspecified: Secondary | ICD-10-CM | POA: Diagnosis not present

## 2018-03-06 ENCOUNTER — Other Ambulatory Visit: Payer: Self-pay | Admitting: Internal Medicine

## 2018-03-06 MED ORDER — CARVEDILOL 12.5 MG PO TABS: 12.5000 mg | ORAL_TABLET | Freq: Two times a day (BID) | ORAL | 3 refills | Status: DC

## 2018-03-06 NOTE — Telephone Encounter (Signed)
New Message           *STAT* If patient is at the pharmacy, call can be transferred to refill team.   1. Which medications need to be refilled? (please list name of each medication and dose if known) Vitamin D/ Coumadin/Coreg  2. Which pharmacy/location (including street and city if local pharmacy) is medication to be sent to? [facility] [address] [place] [address]  3. Do they need a 30 day or 90 day supply? Patient has transferred to this pharmacy/Suppy up to doctor per pharmacist.

## 2018-03-06 NOTE — Telephone Encounter (Signed)
This is a [place] pt °

## 2018-03-08 ENCOUNTER — Other Ambulatory Visit (HOSPITAL_COMMUNITY): Payer: Self-pay | Admitting: *Deleted

## 2018-03-09 ENCOUNTER — Ambulatory Visit (INDEPENDENT_AMBULATORY_CARE_PROVIDER_SITE_OTHER): Payer: Medicare Other

## 2018-03-09 DIAGNOSIS — Z95810 Presence of automatic (implantable) cardiac defibrillator: Secondary | ICD-10-CM

## 2018-03-09 DIAGNOSIS — I5022 Chronic systolic (congestive) heart failure: Secondary | ICD-10-CM | POA: Diagnosis not present

## 2018-03-10 NOTE — Progress Notes (Signed)
EPIC Encounter for ICM Monitoring  Patient Name: Clayton Morton is a 84 y.o. male Date: 03/10/2018 Primary Care Physican: Glenda Chroman, MD Primary Cardiologist: Domenic Polite Electrophysiologist: Allred Last Weight: 162lbs Today's Weight: unknown                                                           Transmission reviewed.     Thoracic impedance normal.  Prescribed: Furosemide 20 mgtake 2 tablets every other day alternating with 1 tablet every other day.Potassium 20 mEq Take 0.5-1 tablets (10-20 mEq total) by mouth as directed. Takes 10 meq with 20 mg lasix and 20 meq with 40 mg lasix  Labs: 12/09/2017 Creatinine 3.02, BUN 79, Potassium 5.6, Sodium 144, EGFR 20-24  Recommendations:  None  Follow-up plan: ICM clinic phone appointment on 02/10/2018 to recheck fluid levels.  Office visit with Dr Rayann Heman 02/24/2018.      Copy of ICM check sent to Dr Rayann Heman.   3 month ICM trend: 03/09/2018    1 Year ICM trend:       Rosalene Billings, RN 03/10/2018 3:39 PM

## 2018-04-03 DIAGNOSIS — Z2821 Immunization not carried out because of patient refusal: Secondary | ICD-10-CM | POA: Diagnosis not present

## 2018-04-03 DIAGNOSIS — I10 Essential (primary) hypertension: Secondary | ICD-10-CM | POA: Diagnosis not present

## 2018-04-03 DIAGNOSIS — Z6828 Body mass index (BMI) 28.0-28.9, adult: Secondary | ICD-10-CM | POA: Diagnosis not present

## 2018-04-03 DIAGNOSIS — E1165 Type 2 diabetes mellitus with hyperglycemia: Secondary | ICD-10-CM | POA: Diagnosis not present

## 2018-04-03 DIAGNOSIS — I429 Cardiomyopathy, unspecified: Secondary | ICD-10-CM | POA: Diagnosis not present

## 2018-04-03 DIAGNOSIS — I509 Heart failure, unspecified: Secondary | ICD-10-CM | POA: Diagnosis not present

## 2018-04-03 DIAGNOSIS — Z299 Encounter for prophylactic measures, unspecified: Secondary | ICD-10-CM | POA: Diagnosis not present

## 2018-04-04 DIAGNOSIS — I2510 Atherosclerotic heart disease of native coronary artery without angina pectoris: Secondary | ICD-10-CM | POA: Diagnosis not present

## 2018-04-04 DIAGNOSIS — E119 Type 2 diabetes mellitus without complications: Secondary | ICD-10-CM | POA: Diagnosis not present

## 2018-04-04 DIAGNOSIS — E7800 Pure hypercholesterolemia, unspecified: Secondary | ICD-10-CM | POA: Diagnosis not present

## 2018-04-04 DIAGNOSIS — I10 Essential (primary) hypertension: Secondary | ICD-10-CM | POA: Diagnosis not present

## 2018-04-13 ENCOUNTER — Other Ambulatory Visit: Payer: Self-pay | Admitting: Cardiology

## 2018-04-13 ENCOUNTER — Ambulatory Visit (INDEPENDENT_AMBULATORY_CARE_PROVIDER_SITE_OTHER): Payer: Medicare Other

## 2018-04-13 DIAGNOSIS — I5022 Chronic systolic (congestive) heart failure: Secondary | ICD-10-CM | POA: Diagnosis not present

## 2018-04-13 DIAGNOSIS — I509 Heart failure, unspecified: Secondary | ICD-10-CM

## 2018-04-13 DIAGNOSIS — Z95810 Presence of automatic (implantable) cardiac defibrillator: Secondary | ICD-10-CM | POA: Diagnosis not present

## 2018-04-14 ENCOUNTER — Other Ambulatory Visit: Payer: Self-pay | Admitting: *Deleted

## 2018-04-14 MED ORDER — LOSARTAN POTASSIUM 50 MG PO TABS: ORAL_TABLET | ORAL | 3 refills | Status: DC

## 2018-04-14 NOTE — Progress Notes (Signed)
EPIC Encounter for ICM Monitoring  Patient Name: Clayton Morton is a 85 y.o. male Date: 04/14/2018 Primary Care Physican: Glenda Chroman, MD Primary Cardiologist: Domenic Polite Electrophysiologist: Allred Last Weight: 162lbs Today's Weight: 160 lbs                                                            Heart Failure questions reviewed, pt asymptomatic.       Thoracic impedance normal.   Prescribed: Furosemide 40 mgtake 1 tablet daily.Potassium 20 mEqtake one tablet when taking Furosemide.  Labs: 12/09/2017 Creatinine 3.02, BUN 79, Potassium 5.6, Sodium 144, EGFR 20-24  Recommendations: No changes.  Encouraged to call for fluid symptoms.  Follow-up plan: ICM clinic phone appointment on 05/15/2018.  Office visit with Dr Domenic Polite on 04/19/2018.      Copy of ICM check sent to Dr Rayann Heman.   3 month ICM trend: 04/13/2018    1 Year ICM trend:       Rosalene Billings, RN 04/14/2018 3:50 PM

## 2018-04-19 ENCOUNTER — Encounter: Payer: Self-pay | Admitting: Cardiology

## 2018-04-19 ENCOUNTER — Encounter: Payer: Self-pay | Admitting: *Deleted

## 2018-04-19 ENCOUNTER — Ambulatory Visit (INDEPENDENT_AMBULATORY_CARE_PROVIDER_SITE_OTHER): Payer: Medicare Other | Admitting: Cardiology

## 2018-04-19 VITALS — BP 125/56 | HR 69 | Ht 68.0 in | Wt 177.8 lb

## 2018-04-19 DIAGNOSIS — N184 Chronic kidney disease, stage 4 (severe): Secondary | ICD-10-CM

## 2018-04-19 DIAGNOSIS — Z95810 Presence of automatic (implantable) cardiac defibrillator: Secondary | ICD-10-CM | POA: Diagnosis not present

## 2018-04-19 DIAGNOSIS — I255 Ischemic cardiomyopathy: Secondary | ICD-10-CM

## 2018-04-19 DIAGNOSIS — I5022 Chronic systolic (congestive) heart failure: Secondary | ICD-10-CM | POA: Diagnosis not present

## 2018-04-19 NOTE — Patient Instructions (Addendum)
Medication Instructions:  Continue all current medications.  Labwork: none  Testing/Procedures: none  Follow-Up: 4 months   Any Other Special Instructions Will Be Listed Below (If Applicable).  If you need a refill on your cardiac medications before your next appointment, please call your pharmacy.\ 

## 2018-04-19 NOTE — Progress Notes (Signed)
Cardiology Office Note  Date: 04/19/2018   ID: Clayton Morton, DOB 1932/08/11, MRN [id number]  PCP: Glenda Chroman, MD  Primary Cardiologist: Rozann Lesches, MD   Chief Complaint  Patient presents with  . Cardiomyopathy    History of Present Illness: Clayton Morton is an 85 y.o. male last seen in October 2019.  He is here for a routine follow-up visit.  He describes no change in stamina.  Admits that he has not been as active although still trying to watch his diet and sodium intake.  His weight has crept up but he does not report orthopnea or leg swelling.  He is primary caregiver for his wife, requires a lot of assistance.  He sees [name] in the device clinic, [place] ICD in place.  Recent thoracic impedance check was normal.  He does not report any device shocks or syncope.  I reviewed his medications which are outlined below.  He reports compliance.  I did review lab work from September 2019.  Past Medical History:  Diagnosis Date  . Age-related macular degeneration, wet, right eye ([place])   . AICD (automatic cardioverter/defibrillator) present 10/30/2014  . Cerebrovascular accident [facility])    Right subcortical infarct 2006  . Chronic systolic congestive heart failure ([place])    a) ECHO (02/2012): EF 30-35%, no obvious thrombus b) ECHO (11/2013): EF 10%, AV mildly calcified, mild MR, RV mod dialted and sys fx sev reduced, TAPSE 0.8, lateral annulus peak S velocity 5.4 c) RHC (11/29/13): RA 15, RV: 65/19, PA 49%, CO/CI: 3.25 / 1.7 d) RHC (12/03/13): RA 1, RV 25/1/2, PA 27/10 (16), PCWP 6, Fick CO/CI: 5.3 / 2.9, PVR 1.9 WU, PA sat 72 and 74%   . Coronary atherosclerosis of native coronary artery    1) CABG 2005 2) LHC (910/15): patent SVG to PDA, occlusion of SVG sequence to PLA, patency of LIMA to LAD and patency of SVG to diagonal with sev stenosis at coronary anastomotic site  . Diabetes mellitus, type 2 ([place])   . Essential hypertension, benign   . History of gout   .  Hyperlipidemia   . Ischemic cardiomyopathy   . Kidney stones   . Macular degeneration    Dr. [name]  . Non-ST elevation MI (NSTEMI) ([place])    2005  . Varicose veins     Past Surgical History:  Procedure Laterality Date  . CARDIAC CATHETERIZATION    . CARDIAC DEFIBRILLATOR PLACEMENT  10/30/2014  . CATARACT EXTRACTION W/ INTRAOCULAR LENS  IMPLANT, BILATERAL Bilateral ~ 1991  . CORONARY ANGIOPLASTY    . CORONARY ARTERY BYPASS GRAFT  2005   LIMA to LAD, SVG diagonal, SVG to PDA and PLA  . CYSTOSCOPY W/ LITHOLAPAXY / EHL    . EP IMPLANTABLE DEVICE N/A 10/30/2014   Procedure: ICD Implant;  Surgeon: Deboraha Sprang, MD, SJM Ellipse DR implanted for primary prevention  . LEFT AND RIGHT HEART CATHETERIZATION WITH CORONARY/GRAFT ANGIOGRAM N/A 11/29/2013   Procedure: LEFT AND RIGHT HEART CATHETERIZATION WITH Beatrix Fetters;  Surgeon: Blane Ohara, MD;  [facility] CATH LAB;  Service: Cardiovascular;  Laterality: N/A;  . RIGHT HEART CATHETERIZATION N/A 12/03/2013   Procedure: RIGHT HEART CATH;  Surgeon: Jolaine Artist, MD;  Location: [facility] CATH LAB;  Service: Cardiovascular;  Laterality: N/A;  . TONSILLECTOMY      Current Outpatient Medications  Medication Sig Dispense Refill  . allopurinol (ZYLOPRIM) 100 MG tablet Take 1 tablet by mouth daily.    [name]  ALPRAZolam (XANAX) 0.25 MG tablet Take 0.5 tablets by mouth daily.  0  . aspirin 81 MG tablet Take 81 mg by mouth daily.    [name] atorvastatin (LIPITOR) 80 MG tablet take 1 tablet by mouth once daily 90 tablet 3  . carvedilol (COREG) 12.5 MG tablet Take 1 tablet (12.5 mg total) by mouth 2 (two) times daily with a meal. 180 tablet 3  . furosemide (LASIX) 20 MG tablet TAKE 2 TABLETS BY MOUTH EVERY OTHER DAY ALTERNATING WITH 1 TABLET EVERY OTHER DAY. 130 tablet 2  . glipiZIDE (GLUCOTROL XL) 5 MG 24 hr tablet Take 1 tablet by mouth 2 (two) times daily.  1  . hydrALAZINE (APRESOLINE) 50 MG tablet TAKE 1 & 1/2 TABLET 3 TIMES DAILY 135 tablet 6    . isosorbide mononitrate (IMDUR) 60 MG 24 hr tablet TAKE ONE TABLET BY MOUTH DAILY 90 tablet 1  . losartan (COZAAR) 50 MG tablet take 1 tablet by mouth every morning and 1/2 tablet by mouth every evening 135 tablet 3  . Omega-3 Fatty Acids (FISH OIL) 1000 MG CAPS Take 1 capsule by mouth 2 (two) times daily.    . ONE TOUCH ULTRA TEST test strip 1 each by Other route 2 (two) times daily.   0  . potassium chloride SA (K-DUR,KLOR-CON) 20 MEQ tablet Take 0.5-1 tablets (10-20 mEq total) by mouth as directed. Takes 10 meq with 20 mg lasix and 20 meq with 40 mg lasix 130 tablet 2  . spironolactone (ALDACTONE) 25 MG tablet TAKE (1/2) TABLET BY MOUTH DAILY. 45 tablet 2  . SYNTHROID 75 MCG tablet Take 1 tablet by mouth daily.     No current facility-administered medications for this visit.    Allergies:  Tetracycline   Social History: The patient  reports that he has never smoked. He has never used smokeless tobacco. He reports that he does not drink alcohol or use drugs.   ROS:  Please see the history of present illness. Otherwise, complete review of systems is positive for hearing loss.  All other systems are reviewed and negative.   Physical Exam: VS:  BP (!) 125/56   Pulse 69   Ht 5' 8"  (1.727 m)   Wt 177 lb 12.8 oz (80.6 kg)   SpO2 96%   BMI 27.03 kg/m , BMI Body mass index is 27.03 kg/m.  Wt Readings from Last 3 Encounters:  04/19/18 177 lb 12.8 oz (80.6 kg)  01/04/18 171 lb 12.8 oz (77.9 kg)  09/29/17 165 lb 3.2 oz (74.9 kg)    General: Elderly male, appears comfortable at rest. HEENT: Conjunctiva and lids normal, oropharynx clear. Neck: Supple, no elevated JVP or carotid bruits, no thyromegaly. Lungs: Clear to auscultation, nonlabored breathing at rest. Cardiac: Regular rate and rhythm, no S3, 2/6 systolic murmur. Abdomen: Soft, nontender, bowel sounds present. Extremities: No pitting edema, distal pulses 2+. Skin: Warm and dry. Musculoskeletal: No kyphosis. Neuropsychiatric:  Alert and oriented x3, affect grossly appropriate.  ECG: I personally reviewed the tracing from 09/29/2017 which showed sinus rhythm with prolonged PR interval, right bundle branch block, left anterior fascicular block.  Recent Labwork:  September 2019: BUN 79, creatinine 3.02, potassium 5.6, hemoglobin 11.2  Other Studies Reviewed Today:  Echocardiogram 09/10/2015: Study Conclusions  - Left ventricle: The cavity size was normal. Wall thickness was increased in a pattern of mild LVH. Systolic function was moderately to severely reduced. The estimated ejection fraction was in the range of 30% to 35%. There is akinesis  of the inferolateral and inferior myocardium. Doppler parameters are consistent with abnormal left ventricular relaxation (grade 1 diastolic dysfunction). - Aortic valve: Mildly calcified annulus. Trileaflet; moderately calcified leaflets. Cusp separation was reduced. There was mild stenosis. There was trivial regurgitation. Mean gradient (S): 8 mm Hg. Peak gradient (S): 15 mm Hg. VTI ratio of LVOT to aortic valve: 0.37. Valve area (VTI): 1.53 cm^2. Valve area (Vmax): 1.38 cm^2. Valve area (Vmean): 1.56 cm^2. - Mitral valve: Calcified annulus. There was mild regurgitation. - Left atrium: The atrium was moderately dilated. - Right ventricle: Pacer wire or catheter noted in right ventricle. Systolic function was mildly reduced. - Atrial septum: No defect or patent foramen ovale was identified. - Tricuspid valve: There was trivial regurgitation. - Pulmonary arteries: PA peak pressure: 29 mm Hg (S). - Pericardium, extracardiac: There was no pericardial effusion.  Impressions:  - Mild LVH with LVEF 30-35%. Inferior/inferolateral akinesis consistent with ischemic cardiomyopathy. Grade 1 diastolic dysfunction with intermediate LV filling pressure. Moderate left atrial enlargement. MAC with mild mitral regurgitation. Mild aortic stenosis  (some parameters suggest mild to moderate low gradient aortic stenosis) with trivial aortic regurgitation. Mildly reduced RV contraction with device wire present. Trivial tricuspid regurgitation with PASP estimated 29 mmHg.  Assessment and Plan:  1.  Chronic systolic heart failure with ischemic cardiomyopathy, LVEF 30 to 35% range.  His weight has crept up but this looks to be potentially more due to caloric intake and decreased activity.  He reports no orthopnea or leg swelling and his recent thoracic impedance was normal.  Lasix is at 20 mg daily for now.  2.  CKD stage IV, follow-up BMET to be obtained.  Unfortunately, may be at a point where we have to take him off Aldactone and Cozaar.  3.  St. Jude ICD in place.  He reports no device shocks or syncope.  Keep follow-up with [name].  4.  CAD status post CABG with documented graft disease.  He reports no active angina symptoms and remains on aspirin and statin.  Current medicines were reviewed with the patient today.  Disposition: Follow-up in 4 months.  Signed, Satira Sark, MD, [facility] 04/19/2018 3:47 PM    [facility] [place], [place], [place] [address] Phone: [phone]; Fax: [phone]

## 2018-04-21 ENCOUNTER — Encounter: Payer: Medicare Other | Admitting: Internal Medicine

## 2018-04-21 ENCOUNTER — Telehealth: Payer: Self-pay | Admitting: *Deleted

## 2018-04-21 DIAGNOSIS — H353114 Nonexudative age-related macular degeneration, right eye, advanced atrophic with subfoveal involvement: Secondary | ICD-10-CM | POA: Diagnosis not present

## 2018-04-21 DIAGNOSIS — H353212 Exudative age-related macular degeneration, right eye, with inactive choroidal neovascularization: Secondary | ICD-10-CM | POA: Diagnosis not present

## 2018-04-21 DIAGNOSIS — H353221 Exudative age-related macular degeneration, left eye, with active choroidal neovascularization: Secondary | ICD-10-CM | POA: Diagnosis not present

## 2018-04-21 NOTE — Telephone Encounter (Signed)
-----   Message from Satira Sark, MD sent at 04/21/2018  3:25 PM EST ----- Results reviewed.  Reviewed lab work per Dr. Woody Seller from July 2019.  Creatinine 3.7, potassium normal.  He sees a nephrologist.  I would have him stop Aldactone at this point. A copy of this test should be forwarded to Glenda Chroman, MD.

## 2018-04-21 NOTE — Telephone Encounter (Signed)
Wife informed and verbalized understanding

## 2018-05-15 ENCOUNTER — Ambulatory Visit (INDEPENDENT_AMBULATORY_CARE_PROVIDER_SITE_OTHER): Payer: Medicare Other

## 2018-05-15 DIAGNOSIS — I5022 Chronic systolic (congestive) heart failure: Secondary | ICD-10-CM

## 2018-05-15 DIAGNOSIS — Z95810 Presence of automatic (implantable) cardiac defibrillator: Secondary | ICD-10-CM | POA: Diagnosis not present

## 2018-05-16 NOTE — Progress Notes (Signed)
EPIC Encounter for ICM Monitoring  Patient Name: Clayton Morton is a 85 y.o. male Date: 05/16/2018 Primary Care Physican: Glenda Chroman, MD Primary Cardiologist: Domenic Polite Electrophysiologist: Allred Last Weight:160lbs Today's Weight:159lbs    Heart Failure questions reviewed, pt asymptomatic.Patient reports Dr Domenic Polite asked him to try taking Furosemide 20 mg 1 tablet daily instead of 40 mg alternating with 20 mg every other day.  He has been taking the 20 for the past week but did forget to take a couple of days.      Thoracic impedance normal 2/24 but was abnormal for a few days prior to that.   Prescribed:Furosemide 20 mgtake take 2 tablets (40 mg total) daily alternating with 1 tablet every other day. Potassium 20 mEqtake one tablet when taking Furosemide.  Labs: 09/20/2019Creatinine 3.02, BUN79, Potassium5.6, Sodium 144, EGFR 20-24  Recommendations: No changes.Encouraged to monitor and call for fluid symptoms.  Follow-up plan: ICM clinic phone appointment on4/09/2018.Office visit with Dr Rayann Heman on 05/26/2018.  Copy of ICM check sent to Dr Rayann Heman.  3 month ICM trend: 05/15/2018    1 Year ICM trend:       Rosalene Billings, RN 05/16/2018 8:06 AM

## 2018-05-26 ENCOUNTER — Ambulatory Visit (INDEPENDENT_AMBULATORY_CARE_PROVIDER_SITE_OTHER): Payer: Medicare Other | Admitting: Internal Medicine

## 2018-05-26 ENCOUNTER — Encounter: Payer: Self-pay | Admitting: Internal Medicine

## 2018-05-26 ENCOUNTER — Other Ambulatory Visit (HOSPITAL_COMMUNITY): Payer: Self-pay | Admitting: Cardiology

## 2018-05-26 VITALS — BP 174/74 | HR 64 | Ht 68.0 in | Wt 172.0 lb

## 2018-05-26 DIAGNOSIS — I255 Ischemic cardiomyopathy: Secondary | ICD-10-CM | POA: Diagnosis not present

## 2018-05-26 DIAGNOSIS — N183 Chronic kidney disease, stage 3 unspecified: Secondary | ICD-10-CM

## 2018-05-26 DIAGNOSIS — I5022 Chronic systolic (congestive) heart failure: Secondary | ICD-10-CM | POA: Diagnosis not present

## 2018-05-26 DIAGNOSIS — N184 Chronic kidney disease, stage 4 (severe): Secondary | ICD-10-CM | POA: Diagnosis not present

## 2018-05-26 LAB — CUP PACEART INCLINIC DEVICE CHECK
Date Time Interrogation Session: 20200306093848
Implantable Lead Implant Date: 20160810
Implantable Lead Implant Date: 20160810
Implantable Lead Location: 753859
Implantable Lead Location: 753860
Implantable Pulse Generator Implant Date: 20160810
Pulse Gen Serial Number: 7179751

## 2018-05-26 NOTE — Progress Notes (Signed)
PCP: Glenda Chroman, MD Primary Cardiologist: Domenic Polite, [place] [facility] Primary EP: Dr Rayann Heman  Clayton Morton is a 85 y.o. male who presents today for routine electrophysiology followup.  Since last being seen in our clinic, the patient reports doing very well.  Today, he denies symptoms of palpitations, chest pain, shortness of breath,  lower extremity edema, dizziness, presyncope, syncope, or ICD shocks.  He states "I feel great".  We discussed [employer] football again today.  The patient is otherwise without complaint today.   Past Medical History:  Diagnosis Date  . Age-related macular degeneration, wet, right eye ([place])   . AICD (automatic cardioverter/defibrillator) present 10/30/2014  . Cerebrovascular accident [facility])    Right subcortical infarct 2006  . Chronic systolic congestive heart failure ([place])    a) ECHO (02/2012): EF 30-35%, no obvious thrombus b) ECHO (11/2013): EF 10%, AV mildly calcified, mild MR, RV mod dialted and sys fx sev reduced, TAPSE 0.8, lateral annulus peak S velocity 5.4 c) RHC (11/29/13): RA 15, RV: 65/19, PA 49%, CO/CI: 3.25 / 1.7 d) RHC (12/03/13): RA 1, RV 25/1/2, PA 27/10 (16), PCWP 6, Fick CO/CI: 5.3 / 2.9, PVR 1.9 WU, PA sat 72 and 74%   . Coronary atherosclerosis of native coronary artery    1) CABG 2005 2) LHC (910/15): patent SVG to PDA, occlusion of SVG sequence to PLA, patency of LIMA to LAD and patency of SVG to diagonal with sev stenosis at coronary anastomotic site  . Diabetes mellitus, type 2 ([place])   . Essential hypertension, benign   . History of gout   . Hyperlipidemia   . Ischemic cardiomyopathy   . Kidney stones   . Macular degeneration    Dr. [name]  . Non-ST elevation MI (NSTEMI) ([place])    2005  . Varicose veins    Past Surgical History:  Procedure Laterality Date  . CARDIAC CATHETERIZATION    . CARDIAC DEFIBRILLATOR PLACEMENT  10/30/2014  . CATARACT EXTRACTION W/ INTRAOCULAR LENS  IMPLANT, BILATERAL Bilateral ~ 1991  . CORONARY ANGIOPLASTY     . CORONARY ARTERY BYPASS GRAFT  2005   LIMA to LAD, SVG diagonal, SVG to PDA and PLA  . CYSTOSCOPY W/ LITHOLAPAXY / EHL    . EP IMPLANTABLE DEVICE N/A 10/30/2014   Procedure: ICD Implant;  Surgeon: Deboraha Sprang, MD, SJM Ellipse DR implanted for primary prevention  . LEFT AND RIGHT HEART CATHETERIZATION WITH CORONARY/GRAFT ANGIOGRAM N/A 11/29/2013   Procedure: LEFT AND RIGHT HEART CATHETERIZATION WITH Beatrix Fetters;  Surgeon: Blane Ohara, MD;  Location: [facility] CATH LAB;  Service: Cardiovascular;  Laterality: N/A;  . RIGHT HEART CATHETERIZATION N/A 12/03/2013   Procedure: RIGHT HEART CATH;  Surgeon: Jolaine Artist, MD;  Location: [facility] CATH LAB;  Service: Cardiovascular;  Laterality: N/A;  . TONSILLECTOMY      ROS- all systems are reviewed and negative except as per HPI above  Current Outpatient Medications  Medication Sig Dispense Refill  . allopurinol (ZYLOPRIM) 100 MG tablet Take 1 tablet by mouth daily.    [name] ALPRAZolam (XANAX) 0.25 MG tablet Take 0.5 tablets by mouth daily.  0  . aspirin 81 MG tablet Take 81 mg by mouth daily as needed.     [name] atorvastatin (LIPITOR) 80 MG tablet take 1 tablet by mouth once daily 90 tablet 3  . carvedilol (COREG) 12.5 MG tablet Take 1 tablet (12.5 mg total) by mouth 2 (two) times daily with a meal. 180 tablet 3  .  furosemide (LASIX) 20 MG tablet Take 20 mg by mouth daily.    [name] glipiZIDE (GLUCOTROL XL) 5 MG 24 hr tablet Take 1 tablet by mouth 2 (two) times daily.  1  . hydrALAZINE (APRESOLINE) 50 MG tablet TAKE 1 & 1/2 TABLET 3 TIMES DAILY 135 tablet 6  . isosorbide mononitrate (IMDUR) 60 MG 24 hr tablet TAKE ONE TABLET BY MOUTH DAILY 90 tablet 1  . losartan (COZAAR) 50 MG tablet Take 25 mg by mouth daily.    . Omega-3 Fatty Acids (FISH OIL) 1000 MG CAPS Take 1 capsule by mouth 2 (two) times daily.    . ONE TOUCH ULTRA TEST test strip 1 each by Other route 2 (two) times daily.   0  . potassium chloride SA (K-DUR,KLOR-CON) 20 MEQ tablet Take  10 mEq by mouth daily.    [name] rOPINIRole (REQUIP) 0.25 MG tablet Take 0.5 mg by mouth at bedtime.    [name] spironolactone (ALDACTONE) 25 MG tablet Take 12.5 mg by mouth daily.    [name] SYNTHROID 75 MCG tablet Take 1 tablet by mouth daily.     No current facility-administered medications for this visit.     Physical Exam: Vitals:   05/26/18 0958  BP: (!) 174/74  Pulse: 64  SpO2: 97%  Weight: 172 lb (78 kg)  Height: 5\' 8"  (1.727 m)    GEN- The patient is elderly appearing, alert and oriented x 3 today.  Smells of urine today Head- normocephalic, atraumatic Eyes-  Sclera clear, conjunctiva pink Ears- hearing intact Oropharynx- clear Lungs- Clear to ausculation bilaterally, normal work of breathing Chest- ICD pocket is well healed Heart- Regular rate and rhythm, no murmurs, rubs or gallops, PMI not laterally displaced GI- soft, NT, ND, + BS Extremities- no clubbing, cyanosis, or edema  ICD interrogation- reviewed in detail today,  See PACEART report    Wt Readings from Last 3 Encounters:  05/26/18 172 lb (78 kg)  04/19/18 177 lb 12.8 oz (80.6 kg)  01/04/18 171 lb 12.8 oz (77.9 kg)    Assessment and Plan:  1.  Chronic systolic dysfunction/ ischemic CM/ CAD euvolemic today No ischemic symptoms Stable on an appropriate medical regimen Normal ICD function See [facility] report No changes today followed in ICM device clinic  2. HTN Elevated He says he forgot to take his antihypertensives last night.  He does not wish to make any changes today.  He feels that BP was well controlled when he saw Dr Woody Seller last.  3. Chronic stage IV renal failure Stable Smells of urine today.  Denies urinary symptoms of urgency, frequency, dysuria, fevers or chills.  No back pain. No change required today  Dr McDowell's recent note is reviewed  Merlin Return in a year   Thompson Grayer MD, [facility] 05/26/2018 10:42 AM

## 2018-05-26 NOTE — Patient Instructions (Signed)
Medication Instructions:  Continue all current medications.   Follow-Up: 1 year - [name]   Any Other Special Instructions Will Be Listed Below (If Applicable).  If you need a refill on your cardiac medications before your next appointment, please call your pharmacy.

## 2018-05-30 ENCOUNTER — Other Ambulatory Visit: Payer: Self-pay | Admitting: *Deleted

## 2018-05-30 MED ORDER — POTASSIUM CHLORIDE CRYS ER 20 MEQ PO TBCR: 10.0000 meq | EXTENDED_RELEASE_TABLET | Freq: Every day | ORAL | 0 refills | Status: DC

## 2018-06-27 ENCOUNTER — Other Ambulatory Visit: Payer: Self-pay

## 2018-06-27 ENCOUNTER — Ambulatory Visit (INDEPENDENT_AMBULATORY_CARE_PROVIDER_SITE_OTHER): Payer: Medicare Other

## 2018-06-27 DIAGNOSIS — E119 Type 2 diabetes mellitus without complications: Secondary | ICD-10-CM | POA: Diagnosis not present

## 2018-06-27 DIAGNOSIS — E7800 Pure hypercholesterolemia, unspecified: Secondary | ICD-10-CM | POA: Diagnosis not present

## 2018-06-27 DIAGNOSIS — I5022 Chronic systolic (congestive) heart failure: Secondary | ICD-10-CM

## 2018-06-27 DIAGNOSIS — I2510 Atherosclerotic heart disease of native coronary artery without angina pectoris: Secondary | ICD-10-CM | POA: Diagnosis not present

## 2018-06-27 DIAGNOSIS — I10 Essential (primary) hypertension: Secondary | ICD-10-CM | POA: Diagnosis not present

## 2018-06-27 DIAGNOSIS — Z95810 Presence of automatic (implantable) cardiac defibrillator: Secondary | ICD-10-CM | POA: Diagnosis not present

## 2018-06-28 NOTE — Progress Notes (Signed)
EPIC Encounter for ICM Monitoring  Patient Name: Clayton Morton is a 85 y.o. male Date: 06/28/2018 Primary Care Physican: Glenda Chroman, MD Primary Cardiologist: Domenic Polite Electrophysiologist: Allred Last Weight:159lbs     Transmission reviewed.     Thoracic impedance normal.  Prescribed:Furosemide 20 mgtake take 1 tablet (20 mg total) daily. Potassium 20 mEqtake 0.5 tablet (10 mEq total) daily.  Labs: 09/20/2019Creatinine 3.02, BUN79, Potassium5.6, Sodium 144, EGFR 20-24  Recommendations: No changes.  Follow-up plan: ICM clinic phone appointment on5/01/2019.  Copy of ICM check sent to Dr Rayann Heman.  3 month ICM trend: 06/27/2018    1 Year ICM trend:       Rosalene Billings, RN 06/28/2018 10:29 AM

## 2018-07-06 ENCOUNTER — Other Ambulatory Visit: Payer: Self-pay | Admitting: Cardiology

## 2018-07-12 DIAGNOSIS — I429 Cardiomyopathy, unspecified: Secondary | ICD-10-CM | POA: Diagnosis not present

## 2018-07-12 DIAGNOSIS — F419 Anxiety disorder, unspecified: Secondary | ICD-10-CM | POA: Diagnosis not present

## 2018-07-12 DIAGNOSIS — Z6828 Body mass index (BMI) 28.0-28.9, adult: Secondary | ICD-10-CM | POA: Diagnosis not present

## 2018-07-12 DIAGNOSIS — E1165 Type 2 diabetes mellitus with hyperglycemia: Secondary | ICD-10-CM | POA: Diagnosis not present

## 2018-07-12 DIAGNOSIS — Z299 Encounter for prophylactic measures, unspecified: Secondary | ICD-10-CM | POA: Diagnosis not present

## 2018-07-12 DIAGNOSIS — I10 Essential (primary) hypertension: Secondary | ICD-10-CM | POA: Diagnosis not present

## 2018-07-12 DIAGNOSIS — I509 Heart failure, unspecified: Secondary | ICD-10-CM | POA: Diagnosis not present

## 2018-07-31 ENCOUNTER — Other Ambulatory Visit: Payer: Self-pay

## 2018-07-31 ENCOUNTER — Telehealth: Payer: Self-pay

## 2018-07-31 ENCOUNTER — Ambulatory Visit (INDEPENDENT_AMBULATORY_CARE_PROVIDER_SITE_OTHER): Payer: Medicare Other

## 2018-07-31 DIAGNOSIS — I5022 Chronic systolic (congestive) heart failure: Secondary | ICD-10-CM | POA: Diagnosis not present

## 2018-07-31 DIAGNOSIS — Z95810 Presence of automatic (implantable) cardiac defibrillator: Secondary | ICD-10-CM

## 2018-07-31 NOTE — Telephone Encounter (Signed)
Remote ICM transmission received.  Attempted call to patient regarding ICM remote transmission and no answer.  

## 2018-07-31 NOTE — Progress Notes (Signed)
EPIC Encounter for ICM Monitoring  Patient Name: Clayton Morton is a 85 y.o. male Date: 07/31/2018 Primary Care Physican: Glenda Chroman, MD Primary Cardiologist: Domenic Polite Electrophysiologist: Allred Last Weight:159lbs 07/31/2018 Weight: unknown    Transmission reviewed. Attempted call to patient and unable to reach.  Transmission reviewed.   CorVue Thoracic impedance abnormal suggesting fluid accumulation starting 07/30/2018.  Prescribed:Furosemide20 mgtaketake 1tablet (20 mg total) daily.Potassium 20 mEqtake 0.5 tablet (10 mEq total) daily.  Labs: 09/20/2019Creatinine 3.02, BUN79, Potassium5.6, Sodium 144, EGFR 20-24  Recommendations: Unable to reach.  Follow-up plan: ICM clinic phone appointment on5/19/2020 to recheck fluid levels.  Copy of ICM check sent to Dr Rayann Heman and Dr Domenic Polite.  3 month ICM trend: 07/31/2018    1 Year ICM trend:       Rosalene Billings, RN 07/31/2018 11:44 AM

## 2018-08-01 ENCOUNTER — Ambulatory Visit (INDEPENDENT_AMBULATORY_CARE_PROVIDER_SITE_OTHER): Payer: Medicare Other | Admitting: *Deleted

## 2018-08-01 ENCOUNTER — Other Ambulatory Visit: Payer: Self-pay

## 2018-08-01 DIAGNOSIS — I5022 Chronic systolic (congestive) heart failure: Secondary | ICD-10-CM | POA: Diagnosis not present

## 2018-08-01 DIAGNOSIS — I255 Ischemic cardiomyopathy: Secondary | ICD-10-CM

## 2018-08-02 LAB — CUP PACEART REMOTE DEVICE CHECK
Date Time Interrogation Session: 20200513100034
Implantable Lead Implant Date: 20160810
Implantable Lead Implant Date: 20160810
Implantable Lead Location: 753859
Implantable Lead Location: 753860
Implantable Pulse Generator Implant Date: 20160810
Pulse Gen Serial Number: 7179751

## 2018-08-04 DIAGNOSIS — I2510 Atherosclerotic heart disease of native coronary artery without angina pectoris: Secondary | ICD-10-CM | POA: Diagnosis not present

## 2018-08-04 DIAGNOSIS — I10 Essential (primary) hypertension: Secondary | ICD-10-CM | POA: Diagnosis not present

## 2018-08-04 DIAGNOSIS — E119 Type 2 diabetes mellitus without complications: Secondary | ICD-10-CM | POA: Diagnosis not present

## 2018-08-04 DIAGNOSIS — E7800 Pure hypercholesterolemia, unspecified: Secondary | ICD-10-CM | POA: Diagnosis not present

## 2018-08-07 ENCOUNTER — Other Ambulatory Visit: Payer: Self-pay | Admitting: Cardiology

## 2018-08-08 ENCOUNTER — Other Ambulatory Visit: Payer: Self-pay

## 2018-08-08 ENCOUNTER — Ambulatory Visit (INDEPENDENT_AMBULATORY_CARE_PROVIDER_SITE_OTHER): Payer: Medicare Other

## 2018-08-08 DIAGNOSIS — Z95810 Presence of automatic (implantable) cardiac defibrillator: Secondary | ICD-10-CM

## 2018-08-08 DIAGNOSIS — I5022 Chronic systolic (congestive) heart failure: Secondary | ICD-10-CM

## 2018-08-09 ENCOUNTER — Telehealth: Payer: Self-pay

## 2018-08-09 NOTE — Progress Notes (Signed)
EPIC Encounter for ICM Monitoring  Patient Name: Clayton Morton is a 85 y.o. male Date: 08/09/2018 Primary Care Physican: Glenda Chroman, MD Primary Cardiologist: Domenic Polite Electrophysiologist: Allred Last Weight:159lbs 07/31/2018 Weight: unknown   Transmission reviewed. Attempted call to patient and unable to reach.  Transmission reviewed.   CorVue Thoracic impedance returned to normal since 07/31/2018 transmission.  Impedance was abnoraml suggesting fluid accumulation starting 07/30/2018 - 08/07/2018  Prescribed:Furosemide20 mgtaketake1tablet (20 mg total) daily.Potassium 20 mEqtake 0.5 tablet (10 mEq total) daily.  Labs: 09/20/2019Creatinine 3.02, BUN79, Potassium5.6, Sodium 144, EGFR 20-24  Recommendations: Unable to reach.  Follow-up plan: ICM clinic phone appointment on6/15/2020.  Copy of ICM check sent to Dr Rayann Heman.  3 month ICM trend: 08/08/2018    1 Year ICM trend:       Rosalene Billings, RN 08/09/2018 3:08 PM

## 2018-08-09 NOTE — Telephone Encounter (Signed)
Remote ICM transmission received.  Attempted call to patient regarding ICM remote transmission and no answer.  

## 2018-08-11 DIAGNOSIS — H353212 Exudative age-related macular degeneration, right eye, with inactive choroidal neovascularization: Secondary | ICD-10-CM | POA: Diagnosis not present

## 2018-08-11 DIAGNOSIS — H353114 Nonexudative age-related macular degeneration, right eye, advanced atrophic with subfoveal involvement: Secondary | ICD-10-CM | POA: Diagnosis not present

## 2018-08-11 DIAGNOSIS — H353221 Exudative age-related macular degeneration, left eye, with active choroidal neovascularization: Secondary | ICD-10-CM | POA: Diagnosis not present

## 2018-08-17 NOTE — Progress Notes (Signed)
Remote ICD transmission.   

## 2018-08-20 NOTE — Progress Notes (Deleted)
{Choose 1 Note Type (Telehealth Visit or Telephone Visit):[phone]}   Date:  08/20/2018   ID:  Clayton Morton, DOB Aug 18, 1932, MRN [id number]  {Patient Location:[phone]::"Home"} {Provider Location:[phone]::"Home"}  PCP:  Glenda Chroman, MD  Cardiologist:  Rozann Lesches, MD Electrophysiologist:  Thompson Grayer, MD   Evaluation Performed:  {Choose Visit ACZY:[id number]::"XNATFT-DD Visit"}  Chief Complaint:  ***  History of Present Illness:    Clayton Morton is an 85 y.o. male last seen in January.  He sees [name] in the device clinic, [place] ICD in place.  The patient {does/does not:200015} have symptoms concerning for COVID-19 infection (fever, chills, cough, or new shortness of breath).    Past Medical History:  Diagnosis Date  . Age-related macular degeneration, wet, right eye ([place])   . AICD (automatic cardioverter/defibrillator) present 10/30/2014  . Cerebrovascular accident [facility])    Right subcortical infarct 2006  . Chronic systolic congestive heart failure ([place])    a) ECHO (02/2012): EF 30-35%, no obvious thrombus b) ECHO (11/2013): EF 10%, AV mildly calcified, mild MR, RV mod dialted and sys fx sev reduced, TAPSE 0.8, lateral annulus peak S velocity 5.4 c) RHC (11/29/13): RA 15, RV: 65/19, PA 49%, CO/CI: 3.25 / 1.7 d) RHC (12/03/13): RA 1, RV 25/1/2, PA 27/10 (16), PCWP 6, Fick CO/CI: 5.3 / 2.9, PVR 1.9 WU, PA sat 72 and 74%   . Coronary atherosclerosis of native coronary artery    1) CABG 2005 2) LHC (910/15): patent SVG to PDA, occlusion of SVG sequence to PLA, patency of LIMA to LAD and patency of SVG to diagonal with sev stenosis at coronary anastomotic site  . Diabetes mellitus, type 2 ([place])   . Essential hypertension, benign   . History of gout   . Hyperlipidemia   . Ischemic cardiomyopathy   . Kidney stones   . Macular degeneration    Dr. [name]  . Non-ST elevation MI (NSTEMI) ([place])    2005  . Varicose veins    Past Surgical History:  Procedure  Laterality Date  . CARDIAC CATHETERIZATION    . CARDIAC DEFIBRILLATOR PLACEMENT  10/30/2014  . CATARACT EXTRACTION W/ INTRAOCULAR LENS  IMPLANT, BILATERAL Bilateral ~ 1991  . CORONARY ANGIOPLASTY    . CORONARY ARTERY BYPASS GRAFT  2005   LIMA to LAD, SVG diagonal, SVG to PDA and PLA  . CYSTOSCOPY W/ LITHOLAPAXY / EHL    . EP IMPLANTABLE DEVICE N/A 10/30/2014   Procedure: ICD Implant;  Surgeon: Deboraha Sprang, MD, SJM Ellipse DR implanted for primary prevention  . LEFT AND RIGHT HEART CATHETERIZATION WITH CORONARY/GRAFT ANGIOGRAM N/A 11/29/2013   Procedure: LEFT AND RIGHT HEART CATHETERIZATION WITH Beatrix Fetters;  Surgeon: Blane Ohara, MD;  Location: [facility] CATH LAB;  Service: Cardiovascular;  Laterality: N/A;  . RIGHT HEART CATHETERIZATION N/A 12/03/2013   Procedure: RIGHT HEART CATH;  Surgeon: Jolaine Artist, MD;  Location: [facility] CATH LAB;  Service: Cardiovascular;  Laterality: N/A;  . TONSILLECTOMY       No outpatient medications have been marked as taking for the 08/21/18 encounter (Appointment) with Satira Sark, MD.     Allergies:   Tetracycline   Social History   Tobacco Use  . Smoking status: Never Smoker  . Smokeless tobacco: Never Used  Substance Use Topics  . Alcohol use: No    Alcohol/week: 0.0 standard drinks  . Drug use: No     Family Hx: The patient's family history includes Dementia in his mother; Hypertension in  his father.  ROS:   Please see the history of present illness.    *** All other systems reviewed and are negative.   Prior CV studies:   The following studies were reviewed today:  Echocardiogram 09/10/2015: Study Conclusions  - Left ventricle: The cavity size was normal. Wall thickness was increased in a pattern of mild LVH. Systolic function was moderately to severely reduced. The estimated ejection fraction was in the range of 30% to 35%. There is akinesis of the inferolateral and inferior myocardium. Doppler  parameters are consistent with abnormal left ventricular relaxation (grade 1 diastolic dysfunction). - Aortic valve: Mildly calcified annulus. Trileaflet; moderately calcified leaflets. Cusp separation was reduced. There was mild stenosis. There was trivial regurgitation. Mean gradient (S): 8 mm Hg. Peak gradient (S): 15 mm Hg. VTI ratio of LVOT to aortic valve: 0.37. Valve area (VTI): 1.53 cm^2. Valve area (Vmax): 1.38 cm^2. Valve area (Vmean): 1.56 cm^2. - Mitral valve: Calcified annulus. There was mild regurgitation. - Left atrium: The atrium was moderately dilated. - Right ventricle: Pacer wire or catheter noted in right ventricle. Systolic function was mildly reduced. - Atrial septum: No defect or patent foramen ovale was identified. - Tricuspid valve: There was trivial regurgitation. - Pulmonary arteries: PA peak pressure: 29 mm Hg (S). - Pericardium, extracardiac: There was no pericardial effusion.  Impressions:  - Mild LVH with LVEF 30-35%. Inferior/inferolateral akinesis consistent with ischemic cardiomyopathy. Grade 1 diastolic dysfunction with intermediate LV filling pressure. Moderate left atrial enlargement. MAC with mild mitral regurgitation. Mild aortic stenosis (some parameters suggest mild to moderate low gradient aortic stenosis) with trivial aortic regurgitation. Mildly reduced RV contraction with device wire present. Trivial tricuspid regurgitation with PASP estimated 29 mmHg.  Labs/Other Tests and Data Reviewed:    EKG:  An ECG dated 09/29/2017 was personally reviewed today and demonstrated:  Sinus rhythm with prolonged PR interval, RBBB, LAFB.  Recent Labs:  September 2019: BUN 79, creatinine 3.02, potassium 5.6, hemoglobin 11.2   Recent Lipid Panel Lab Results  Component Value Date/Time   CHOL 111 08/05/2015 02:48 PM   TRIG 156 (H) 08/05/2015 02:48 PM   HDL 31 (L) 08/05/2015 02:48 PM   CHOLHDL 3.6 08/05/2015 02:48 PM    LDLCALC 49 08/05/2015 02:48 PM    Wt Readings from Last 3 Encounters:  05/26/18 172 lb (78 kg)  04/19/18 177 lb 12.8 oz (80.6 kg)  01/04/18 171 lb 12.8 oz (77.9 kg)     Objective:    Vital Signs:  There were no vitals taken for this visit.   {Heart[facility] Virtual Exam (Optional):[phone]::"VITAL SIGNS:  reviewed"}  ASSESSMENT & PLAN:    1. ***  COVID-19 Education: The signs and symptoms of COVID-19 were discussed with the patient and how to seek care for testing (follow up with PCP or arrange E-visit).  ***The importance of social distancing was discussed today.  Time:   Today, I have spent *** minutes with the patient with telehealth technology discussing the above problems.     Medication Adjustments/Labs and Tests Ordered: Current medicines are reviewed at length with the patient today.  Concerns regarding medicines are outlined above.   Tests Ordered: No orders of the defined types were placed in this encounter.   Medication Changes: No orders of the defined types were placed in this encounter.   Disposition:  Follow up {follow up:15908}  Signed, Rozann Lesches, MD  08/20/2018 2:24 PM    [place]

## 2018-08-21 ENCOUNTER — Telehealth: Payer: Self-pay | Admitting: *Deleted

## 2018-08-21 ENCOUNTER — Ambulatory Visit: Payer: Medicare Other | Admitting: Cardiology

## 2018-08-21 NOTE — Telephone Encounter (Signed)
Called to offer VV and patient declined. Denied worsening cardiac symptoms. Will contact office if he has problems.

## 2018-09-04 ENCOUNTER — Ambulatory Visit (INDEPENDENT_AMBULATORY_CARE_PROVIDER_SITE_OTHER): Payer: Medicare Other

## 2018-09-04 DIAGNOSIS — Z95810 Presence of automatic (implantable) cardiac defibrillator: Secondary | ICD-10-CM

## 2018-09-04 DIAGNOSIS — I5022 Chronic systolic (congestive) heart failure: Secondary | ICD-10-CM

## 2018-09-05 ENCOUNTER — Telehealth: Payer: Self-pay

## 2018-09-05 NOTE — Telephone Encounter (Signed)
Remote ICM transmission received.  Attempted call to patient regarding ICM remote transmission and no answer or answering machine. 

## 2018-09-05 NOTE — Progress Notes (Signed)
EPIC Encounter for ICM Monitoring  Patient Name: Janathan L Stowell is a 85 y.o. male Date: 09/05/2018 Primary Care Physican: Vyas, Dhruv B, MD Primary Cardiologist: McDowell Electrophysiologist: Allred Last Weight:159lbs   Transmission reviewed. Attempted call to patient and unable to reach. Transmission reviewed.   CorVueThoracic impedancesuggesting possible fluid accumulation starting 09/03/2018 and us ongoing.  Prescribed:Furosemide20 mgtake two (2) tablets by mouth daily alternating with one tablet daily.Potassium 20 mEqtake 0.5 tablet (10 mEq total) daily.  Labs: 09/20/2019Creatinine 3.02, BUN79, Potassium5.6, Sodium 144, EGFR 20-24  Recommendations: Unable to reach.  Follow-up plan: ICM clinic phone appointment on6/24/2020 to recheck fluid levels.  Copy of ICM check sent to Dr Allred and Dr McDowell.  3 month ICM trend: 09/04/2018    1 Year ICM trend:       Laurie S Short, RN 09/05/2018 1:46 PM   

## 2018-09-11 DIAGNOSIS — E7800 Pure hypercholesterolemia, unspecified: Secondary | ICD-10-CM | POA: Diagnosis not present

## 2018-09-11 DIAGNOSIS — I10 Essential (primary) hypertension: Secondary | ICD-10-CM | POA: Diagnosis not present

## 2018-09-11 DIAGNOSIS — I2510 Atherosclerotic heart disease of native coronary artery without angina pectoris: Secondary | ICD-10-CM | POA: Diagnosis not present

## 2018-09-11 DIAGNOSIS — E119 Type 2 diabetes mellitus without complications: Secondary | ICD-10-CM | POA: Diagnosis not present

## 2018-09-13 ENCOUNTER — Telehealth: Payer: Self-pay

## 2018-09-13 ENCOUNTER — Ambulatory Visit (INDEPENDENT_AMBULATORY_CARE_PROVIDER_SITE_OTHER): Payer: Medicare Other

## 2018-09-13 DIAGNOSIS — I5022 Chronic systolic (congestive) heart failure: Secondary | ICD-10-CM

## 2018-09-13 DIAGNOSIS — Z95810 Presence of automatic (implantable) cardiac defibrillator: Secondary | ICD-10-CM

## 2018-09-13 NOTE — Progress Notes (Signed)
EPIC Encounter for ICM Monitoring  Patient Name: Clayton Morton is a 85 y.o. male Date: 09/13/2018 Primary Care Physican: Glenda Chroman, MD Primary Cardiologist: Domenic Polite Electrophysiologist: Allred Last Weight:159lbs   Transmission reviewed. Attempted call to patient and unable to reach. Transmission reviewed.   CorVueThoracic impedancesuggesting ongoing possible fluid accumulation starting 09/03/2018.  Prescribed:Furosemide20 mgtake two (2) tablets by mouth daily alternating with one tablet daily.Potassium 20 mEqtake 0.5 tablet (10 mEq total) daily.  Labs: 09/20/2019Creatinine 3.02, BUN79, Potassium5.6, Sodium 144, EGFR 20-24  Recommendations: Unable to reach.  Follow-up plan: ICM clinic phone appointment on7/09/2018 to recheck fluid levels.  Copy of ICM check sent to Dr Rayann Heman and Dr Domenic Polite for review.  3 month ICM trend: 09/13/2018    1 Year ICM trend:       Rosalene Billings, RN 09/13/2018 8:25 AM

## 2018-09-13 NOTE — Telephone Encounter (Signed)
Remote ICM transmission received.  Attempted call to patient regarding ICM remote transmission and left detailed message, per DPR, to return call.    

## 2018-09-26 ENCOUNTER — Ambulatory Visit (INDEPENDENT_AMBULATORY_CARE_PROVIDER_SITE_OTHER): Payer: Medicare Other

## 2018-09-26 DIAGNOSIS — Z6828 Body mass index (BMI) 28.0-28.9, adult: Secondary | ICD-10-CM | POA: Diagnosis not present

## 2018-09-26 DIAGNOSIS — R5383 Other fatigue: Secondary | ICD-10-CM | POA: Diagnosis not present

## 2018-09-26 DIAGNOSIS — Z1331 Encounter for screening for depression: Secondary | ICD-10-CM | POA: Diagnosis not present

## 2018-09-26 DIAGNOSIS — Z0000 Encounter for general adult medical examination without abnormal findings: Secondary | ICD-10-CM | POA: Diagnosis not present

## 2018-09-26 DIAGNOSIS — Z7189 Other specified counseling: Secondary | ICD-10-CM | POA: Diagnosis not present

## 2018-09-26 DIAGNOSIS — Z1339 Encounter for screening examination for other mental health and behavioral disorders: Secondary | ICD-10-CM | POA: Diagnosis not present

## 2018-09-26 DIAGNOSIS — I5022 Chronic systolic (congestive) heart failure: Secondary | ICD-10-CM

## 2018-09-26 DIAGNOSIS — Z299 Encounter for prophylactic measures, unspecified: Secondary | ICD-10-CM | POA: Diagnosis not present

## 2018-09-26 DIAGNOSIS — Z1211 Encounter for screening for malignant neoplasm of colon: Secondary | ICD-10-CM | POA: Diagnosis not present

## 2018-09-26 DIAGNOSIS — E039 Hypothyroidism, unspecified: Secondary | ICD-10-CM | POA: Diagnosis not present

## 2018-09-26 DIAGNOSIS — Z95810 Presence of automatic (implantable) cardiac defibrillator: Secondary | ICD-10-CM

## 2018-09-26 DIAGNOSIS — I509 Heart failure, unspecified: Secondary | ICD-10-CM | POA: Diagnosis not present

## 2018-09-27 DIAGNOSIS — R5383 Other fatigue: Secondary | ICD-10-CM | POA: Diagnosis not present

## 2018-09-27 DIAGNOSIS — Z125 Encounter for screening for malignant neoplasm of prostate: Secondary | ICD-10-CM | POA: Diagnosis not present

## 2018-09-27 DIAGNOSIS — E7800 Pure hypercholesterolemia, unspecified: Secondary | ICD-10-CM | POA: Diagnosis not present

## 2018-09-27 DIAGNOSIS — Z79899 Other long term (current) drug therapy: Secondary | ICD-10-CM | POA: Diagnosis not present

## 2018-09-27 DIAGNOSIS — E039 Hypothyroidism, unspecified: Secondary | ICD-10-CM | POA: Diagnosis not present

## 2018-09-27 NOTE — Progress Notes (Signed)
EPIC Encounter for ICM Monitoring  Patient Name: BRADYN SOWARD is a 85 y.o. male Date: 09/27/2018 Primary Care Physican: Glenda Chroman, MD Primary Cardiologist: Domenic Polite Electrophysiologist: Allred Last Weight:159lbs   Transmission reviewed.   CorVueThoracic impedancereturned back to normal since 09/13/2018 transmission.  Prescribed:Furosemide20 mgtake two(2)tablets by mouth daily alternating with one tablet daily.Potassium 20 mEqtake 0.5 tablet (10 mEq total) daily.  Labs: 09/20/2019Creatinine 3.02, BUN79, Potassium5.6, Sodium 144, EGFR 20-24  Recommendations:None  Follow-up plan: ICM clinic phone appointment on7/27/2020.   Copy of ICM check sent to Dr Rayann Heman   Direct Trend View through 09/23/2018    Rosalene Billings, RN 09/27/2018 10:52 AM

## 2018-10-05 ENCOUNTER — Other Ambulatory Visit: Payer: Self-pay | Admitting: Cardiology

## 2018-10-05 DIAGNOSIS — I509 Heart failure, unspecified: Secondary | ICD-10-CM

## 2018-10-06 DIAGNOSIS — H353114 Nonexudative age-related macular degeneration, right eye, advanced atrophic with subfoveal involvement: Secondary | ICD-10-CM | POA: Diagnosis not present

## 2018-10-06 DIAGNOSIS — H353212 Exudative age-related macular degeneration, right eye, with inactive choroidal neovascularization: Secondary | ICD-10-CM | POA: Diagnosis not present

## 2018-10-06 DIAGNOSIS — H353221 Exudative age-related macular degeneration, left eye, with active choroidal neovascularization: Secondary | ICD-10-CM | POA: Diagnosis not present

## 2018-10-12 ENCOUNTER — Encounter: Payer: Self-pay | Admitting: Cardiology

## 2018-10-12 DIAGNOSIS — Z299 Encounter for prophylactic measures, unspecified: Secondary | ICD-10-CM | POA: Diagnosis not present

## 2018-10-12 DIAGNOSIS — I509 Heart failure, unspecified: Secondary | ICD-10-CM | POA: Diagnosis not present

## 2018-10-12 DIAGNOSIS — I429 Cardiomyopathy, unspecified: Secondary | ICD-10-CM | POA: Diagnosis not present

## 2018-10-12 DIAGNOSIS — Z6828 Body mass index (BMI) 28.0-28.9, adult: Secondary | ICD-10-CM | POA: Diagnosis not present

## 2018-10-12 DIAGNOSIS — I10 Essential (primary) hypertension: Secondary | ICD-10-CM | POA: Diagnosis not present

## 2018-10-12 DIAGNOSIS — E1165 Type 2 diabetes mellitus with hyperglycemia: Secondary | ICD-10-CM | POA: Diagnosis not present

## 2018-10-12 DIAGNOSIS — H353221 Exudative age-related macular degeneration, left eye, with active choroidal neovascularization: Secondary | ICD-10-CM | POA: Diagnosis not present

## 2018-10-16 ENCOUNTER — Ambulatory Visit (INDEPENDENT_AMBULATORY_CARE_PROVIDER_SITE_OTHER): Payer: Medicare Other

## 2018-10-16 ENCOUNTER — Telehealth: Payer: Self-pay

## 2018-10-16 DIAGNOSIS — Z95810 Presence of automatic (implantable) cardiac defibrillator: Secondary | ICD-10-CM

## 2018-10-16 DIAGNOSIS — I5022 Chronic systolic (congestive) heart failure: Secondary | ICD-10-CM

## 2018-10-16 NOTE — Progress Notes (Signed)
EPIC Encounter for ICM Monitoring  Patient Name: Clayton Morton is a 85 y.o. male Date: 10/16/2018 Primary Care Physican: Glenda Chroman, MD Primary Cardiologist: Domenic Polite Electrophysiologist: Allred Last Weight:159lbs   Attempted call to patient and unable to reach. Transmission reviewed.   CorVueThoracic impedance normal.  Prescribed:Furosemide20 mgtake two(2)tablets by mouth daily alternating with one tablet daily.Potassium 20 mEqtake 0.5 tablet (10 mEq total) daily.  Labs: 09/20/2019Creatinine 3.02, BUN79, Potassium5.6, Sodium 144, EGFR 20-24  Recommendations: Unable to reach.   Follow-up plan: ICM clinic phone appointment on8/31/2020.   3 month ICM trend: 10/16/2018    1 Year ICM trend:       Rosalene Billings, RN 10/16/2018 4:52 PM

## 2018-10-16 NOTE — Telephone Encounter (Signed)
Remote ICM transmission received.  Attempted call to patient regarding ICM remote transmission and no answer.  

## 2018-10-17 DIAGNOSIS — E7800 Pure hypercholesterolemia, unspecified: Secondary | ICD-10-CM | POA: Diagnosis not present

## 2018-10-17 DIAGNOSIS — I10 Essential (primary) hypertension: Secondary | ICD-10-CM | POA: Diagnosis not present

## 2018-10-17 DIAGNOSIS — I2510 Atherosclerotic heart disease of native coronary artery without angina pectoris: Secondary | ICD-10-CM | POA: Diagnosis not present

## 2018-10-17 DIAGNOSIS — E119 Type 2 diabetes mellitus without complications: Secondary | ICD-10-CM | POA: Diagnosis not present

## 2018-10-31 ENCOUNTER — Ambulatory Visit (INDEPENDENT_AMBULATORY_CARE_PROVIDER_SITE_OTHER): Payer: Medicare Other | Admitting: *Deleted

## 2018-10-31 DIAGNOSIS — I255 Ischemic cardiomyopathy: Secondary | ICD-10-CM | POA: Diagnosis not present

## 2018-10-31 LAB — CUP PACEART REMOTE DEVICE CHECK
Battery Remaining Longevity: 49 mo
Battery Remaining Percentage: 52 %
Battery Voltage: 2.92 V
Brady Statistic AP VP Percent: 1 %
Brady Statistic AP VS Percent: 3.4 %
Brady Statistic AS VP Percent: 1 %
Brady Statistic AS VS Percent: 95 %
Brady Statistic RA Percent Paced: 2.8 %
Brady Statistic RV Percent Paced: 1 %
Date Time Interrogation Session: 20200811060016
HighPow Impedance: 60 Ohm
HighPow Impedance: 60 Ohm
Implantable Lead Implant Date: 20160810
Implantable Lead Implant Date: 20160810
Implantable Lead Location: 753859
Implantable Lead Location: 753860
Implantable Pulse Generator Implant Date: 20160810
Lead Channel Impedance Value: 290 Ohm
Lead Channel Impedance Value: 410 Ohm
Lead Channel Pacing Threshold Amplitude: 1.25 V
Lead Channel Pacing Threshold Amplitude: 1.25 V
Lead Channel Pacing Threshold Pulse Width: 0.5 ms
Lead Channel Pacing Threshold Pulse Width: 0.5 ms
Lead Channel Sensing Intrinsic Amplitude: 1.4 mV
Lead Channel Sensing Intrinsic Amplitude: 9.6 mV
Lead Channel Setting Pacing Amplitude: 2.25 V
Lead Channel Setting Pacing Amplitude: 2.5 V
Lead Channel Setting Pacing Pulse Width: 0.5 ms
Lead Channel Setting Sensing Sensitivity: 0.5 mV
Pulse Gen Serial Number: 7179751

## 2018-11-08 ENCOUNTER — Encounter: Payer: Self-pay | Admitting: Cardiology

## 2018-11-08 NOTE — Progress Notes (Signed)
Remote ICD transmission.   

## 2018-11-12 NOTE — Progress Notes (Signed)
Cardiology Office Note  Date: 11/13/2018   ID: Clayton Morton, DOB [birth year], MRN [id number]  PCP:  Glenda Chroman, MD  Cardiologist:  Rozann Lesches, MD Electrophysiologist:  Thompson Grayer, MD   Chief Complaint  Patient presents with  . Cardiac follow-up    History of Present Illness:  Clayton Morton is an 85 y.o. male last seen in January.  He is here for a follow-up visit.  He does not report any major change in stamina or shortness of breath, has had no device shocks or syncope.  His weight is up some but his recent thoracic impedance was normal in July.  He is now on Lasix at 20 mg daily.  We are requesting his interval lab work from Dr. Woody Seller.  He sees [name] in the device clinic, [place] ICD in place.  Last echocardiogram was in 2017 as outlined below.  We discussed obtaining an updated study for reevaluation.  I reviewed his medications which are outlined below.  Cardiac regimen includes aspirin, Lipitor, Coreg, Lasix, hydralazine, Imdur, Cozaar, Aldactone and potassium supplements.  Past Medical History:  Diagnosis Date  . Age-related macular degeneration, wet, right eye ([place])   . AICD (automatic cardioverter/defibrillator) present 10/30/2014  . Cerebrovascular accident [facility])    Right subcortical infarct 2006  . Chronic systolic congestive heart failure ([place])    a) ECHO (02/2012): EF 30-35%, no obvious thrombus b) ECHO (11/2013): EF 10%, AV mildly calcified, mild MR, RV mod dialted and sys fx sev reduced, TAPSE 0.8, lateral annulus peak S velocity 5.4 c) RHC (11/29/13): RA 15, RV: 65/19, PA 49%, CO/CI: 3.25 / 1.7 d) RHC (12/03/13): RA 1, RV 25/1/2, PA 27/10 (16), PCWP 6, Fick CO/CI: 5.3 / 2.9, PVR 1.9 WU, PA sat 72 and 74%   . Coronary atherosclerosis of native coronary artery    1) CABG 2005 2) LHC (910/15): patent SVG to PDA, occlusion of SVG sequence to PLA, patency of LIMA to LAD and patency of SVG to diagonal with sev stenosis at coronary anastomotic site  . Diabetes  mellitus, type 2 ([place])   . Essential hypertension, benign   . History of gout   . Hyperlipidemia   . Ischemic cardiomyopathy   . Kidney stones   . Macular degeneration    Dr. [name]  . Non-ST elevation MI (NSTEMI) ([place])    2005  . Varicose veins     Past Surgical History:  Procedure Laterality Date  . CARDIAC CATHETERIZATION    . CARDIAC DEFIBRILLATOR PLACEMENT  10/30/2014  . CATARACT EXTRACTION W/ INTRAOCULAR LENS  IMPLANT, BILATERAL Bilateral ~ 1991  . CORONARY ANGIOPLASTY    . CORONARY ARTERY BYPASS GRAFT  2005   LIMA to LAD, SVG diagonal, SVG to PDA and PLA  . CYSTOSCOPY W/ LITHOLAPAXY / EHL    . EP IMPLANTABLE DEVICE N/A 10/30/2014   Procedure: ICD Implant;  Surgeon: Deboraha Sprang, MD, SJM Ellipse DR implanted for primary prevention  . LEFT AND RIGHT HEART CATHETERIZATION WITH CORONARY/GRAFT ANGIOGRAM N/A 11/29/2013   Procedure: LEFT AND RIGHT HEART CATHETERIZATION WITH Beatrix Fetters;  Surgeon: Blane Ohara, MD;  Location: [facility] CATH LAB;  Service: Cardiovascular;  Laterality: N/A;  . RIGHT HEART CATHETERIZATION N/A 12/03/2013   Procedure: RIGHT HEART CATH;  Surgeon: Jolaine Artist, MD;  Location: [facility] CATH LAB;  Service: Cardiovascular;  Laterality: N/A;  . TONSILLECTOMY      Current Outpatient Medications  Medication Sig Dispense Refill  . allopurinol (ZYLOPRIM)  100 MG tablet Take 1 tablet by mouth daily.    [name] ALPRAZolam (XANAX) 0.25 MG tablet Take 0.5 tablets by mouth daily.  0  . aspirin 81 MG tablet Take 81 mg by mouth daily as needed.     [name] atorvastatin (LIPITOR) 80 MG tablet take 1 tablet by mouth once daily 90 tablet 3  . carvedilol (COREG) 12.5 MG tablet Take 1 tablet (12.5 mg total) by mouth 2 (two) times daily with a meal. 180 tablet 3  . furosemide (LASIX) 20 MG tablet TAKE TWO (2) TABLETS BY MOUTH DAILY ALTERNATING WITH ONE TABLET DAILY 130 tablet 1  . glipiZIDE (GLUCOTROL XL) 5 MG 24 hr tablet Take 1 tablet by mouth 2 (two) times daily.  1  .  hydrALAZINE (APRESOLINE) 50 MG tablet TAKE 1 AND 1/2 TABLETS BY MOUTH THREE TIMES DAILY 135 tablet 5  . isosorbide mononitrate (IMDUR) 60 MG 24 hr tablet TAKE ONE TABLET BY MOUTH DAILY 90 tablet 3  . losartan (COZAAR) 50 MG tablet Take 25 mg by mouth daily.    . Omega-3 Fatty Acids (FISH OIL) 1000 MG CAPS Take 1 capsule by mouth 2 (two) times daily.    . ONE TOUCH ULTRA TEST test strip 1 each by Other route 2 (two) times daily.   0  . potassium chloride SA (K-DUR) 20 MEQ tablet TAKE 1/2 TABLET BY MOUTH DAILY 45 tablet 2  . rOPINIRole (REQUIP) 0.25 MG tablet Take 0.5 mg by mouth at bedtime.    [name] spironolactone (ALDACTONE) 25 MG tablet TAKE 1/2 TABLET BY MOUTH DAILY 45 tablet 3  . SYNTHROID 75 MCG tablet Take 1 tablet by mouth daily.     No current facility-administered medications for this visit.    Allergies:  Tetracycline   Social History: The patient  reports that he has never smoked. He has never used smokeless tobacco. He reports that he does not drink alcohol or use drugs.   ROS:  Please see the history of present illness. Otherwise, complete review of systems is positive for hearing loss, intermittent leg swelling.  All other systems are reviewed and negative.   Physical Exam: VS:  BP (!) 142/60   Pulse 64   Temp 98.6 F (37 C)   Ht _0  (1.727 m)   Wt 178 lb (80.7 kg)   SpO2 98%   BMI 27.06 kg/m , BMI Body mass index is 27.06 kg/m.  Wt Readings from Last 3 Encounters:  11/13/18 178 lb (80.7 kg)  05/26/18 172 lb (78 kg)  04/19/18 177 lb 12.8 oz (80.6 kg)    General: Elderly male, no distress. HEENT: Conjunctiva and lids normal, wearing a mask. Neck: Supple, mildly elevated JVP, no carotid bruits, no thyromegaly. Lungs: Clear to auscultation, nonlabored breathing at rest. Cardiac: Regular rate and rhythm, no S3, 2/6 systolic murmur. Abdomen: Soft, nontender, bowel sounds present. Extremities: Right greater than left 1-2+ edema, distal pulses 2+. Skin: Warm and dry.  Musculoskeletal: No kyphosis. Neuropsychiatric: Alert and oriented x3, affect grossly appropriate.  ECG:  An ECG dated 09/29/2017 was personally reviewed today and demonstrated:  Sinus rhythm with prolonged PR interval, right bundle branch block, left anterior fascicular block.  Recent Labwork:  September 2019: BUN 79, creatinine 3.02, potassium 5.6, hemoglobin 11.2 No interval lab work for review.  Other Studies Reviewed Today:  Echocardiogram 09/10/2015: Study Conclusions  - Left ventricle: The cavity size was normal. Wall thickness was increased in a pattern of mild LVH. Systolic function was moderately  to severely reduced. The estimated ejection fraction was in the range of 30% to 35%. There is akinesis of the inferolateral and inferior myocardium. Doppler parameters are consistent with abnormal left ventricular relaxation (grade 1 diastolic dysfunction). - Aortic valve: Mildly calcified annulus. Trileaflet; moderately calcified leaflets. Cusp separation was reduced. There was mild stenosis. There was trivial regurgitation. Mean gradient (S): 8 mm Hg. Peak gradient (S): 15 mm Hg. VTI ratio of LVOT to aortic valve: 0.37. Valve area (VTI): 1.53 cm^2. Valve area (Vmax): 1.38 cm^2. Valve area (Vmean): 1.56 cm^2. - Mitral valve: Calcified annulus. There was mild regurgitation. - Left atrium: The atrium was moderately dilated. - Right ventricle: Pacer wire or catheter noted in right ventricle. Systolic function was mildly reduced. - Atrial septum: No defect or patent foramen ovale was identified. - Tricuspid valve: There was trivial regurgitation. - Pulmonary arteries: PA peak pressure: 29 mm Hg (S). - Pericardium, extracardiac: There was no pericardial effusion.  Impressions:  - Mild LVH with LVEF 30-35%. Inferior/inferolateral akinesis consistent with ischemic cardiomyopathy. Grade 1 diastolic dysfunction with intermediate LV filling pressure.  Moderate left atrial enlargement. MAC with mild mitral regurgitation. Mild aortic stenosis (some parameters suggest mild to moderate low gradient aortic stenosis) with trivial aortic regurgitation. Mildly reduced RV contraction with device wire present. Trivial tricuspid regurgitation with PASP estimated 29 mmHg.  Assessment and Plan:  1.  Chronic systolic heart failure with ischemic cardiomyopathy and LVEF 30 to 35% by last assessment.  Plan is to obtain a follow-up echocardiogram.  His recent thoracic impedance was normal but he does have leg swelling and is on Lasix at 20 mg daily now in the setting of CKD stage IV.  I am concerned that we are going to need to make medication adjustments based on his echocardiogram, also requesting interval lab work from PCP.  2.  CKD stage IV.  Requesting interval lab work.  He has pending evaluation with nephrology as well.  3.  CAD status post CABG with documented graft disease.  He reports no active angina and remains on aspirin and statin therapy.  4.  St. Jude ICD in place.  No device shocks or syncope.  Keep follow-up with [name].  Medication Adjustments/Labs and Tests Ordered: Current medicines are reviewed at length with the patient today.  Concerns regarding medicines are outlined above.   Tests Ordered: Orders Placed This Encounter  Procedures  . EKG 12-Lead  . ECHOCARDIOGRAM COMPLETE    Medication Changes: No orders of the defined types were placed in this encounter.   Disposition:  Follow up 4 months in the [place] office.  Signed, Satira Sark, MD, [facility] 11/13/2018 2:50 PM    [facility] [place], [place], [place] [address] Phone: [phone]; Fax: [phone]

## 2018-11-13 ENCOUNTER — Other Ambulatory Visit: Payer: Self-pay

## 2018-11-13 ENCOUNTER — Encounter: Payer: Self-pay | Admitting: Cardiology

## 2018-11-13 ENCOUNTER — Ambulatory Visit (INDEPENDENT_AMBULATORY_CARE_PROVIDER_SITE_OTHER): Payer: Medicare Other | Admitting: Cardiology

## 2018-11-13 ENCOUNTER — Telehealth: Payer: Self-pay | Admitting: Cardiology

## 2018-11-13 VITALS — BP 142/60 | HR 64 | Temp 98.6°F | Ht 68.0 in | Wt 178.0 lb

## 2018-11-13 DIAGNOSIS — Z95810 Presence of automatic (implantable) cardiac defibrillator: Secondary | ICD-10-CM

## 2018-11-13 DIAGNOSIS — I255 Ischemic cardiomyopathy: Secondary | ICD-10-CM | POA: Diagnosis not present

## 2018-11-13 DIAGNOSIS — I5022 Chronic systolic (congestive) heart failure: Secondary | ICD-10-CM

## 2018-11-13 DIAGNOSIS — N184 Chronic kidney disease, stage 4 (severe): Secondary | ICD-10-CM

## 2018-11-13 NOTE — Telephone Encounter (Signed)
°  Precert needed VDP:BAQV - cardiomyopathy  Location: [facility]    Date: Sept 9, 2020

## 2018-11-13 NOTE — Patient Instructions (Signed)
Medication Instructions:  Continue all current medications.  Labwork: none  Testing/Procedures: Your physician has requested that you have an echocardiogram. Echocardiography is a painless test that uses sound waves to create images of your heart. It provides your doctor with information about the size and shape of your heart and how well your heart's chambers and valves are working. This procedure takes approximately one hour. There are no restrictions for this procedure. Office will contact with results via phone or letter.     Follow-Up: 4 months   Any Other Special Instructions Will Be Listed Below (If Applicable).   If you need a refill on your cardiac medications before your next appointment, please call your pharmacy.  

## 2018-11-16 ENCOUNTER — Encounter: Payer: Self-pay | Admitting: *Deleted

## 2018-11-22 ENCOUNTER — Telehealth: Payer: Self-pay | Admitting: *Deleted

## 2018-11-22 NOTE — Telephone Encounter (Signed)
-----   Message from Satira Sark, MD sent at 11/21/2018  2:57 PM EDT ----- Results reviewed.  I looked at his outside lab work.  He is anemic with hemoglobin 10.3, possibly related to chronic kidney disease.  Creatinine is 3.25 with potassium 5.4.  I would ask him to stop both Cozaar and Aldactone.  Change Lasix to 40 mg daily and stop potassium for now.  He should go ahead and follow through with nephrology appointment.

## 2018-11-23 NOTE — Telephone Encounter (Signed)
Patient informed and verbalized understanding of plan. 

## 2018-11-23 NOTE — Telephone Encounter (Signed)
Patient informed and verbalized understanding Clarification needed for furosemide. Patient has been taking furosemide 20 mg daily for 6 months.

## 2018-11-23 NOTE — Telephone Encounter (Signed)
Yes I would like for him to try Lasix 40 mg daily which is higher than we was previously taking.  The problem is, he is going to most likely need a higher dose due to his current kidney disease in order to achieve reasonable diuresis, and we will already pulling away other medications as well.  If he finds that he loses weight quickly, he might go to 40 mg alternating with 20 mg every other day.

## 2018-11-28 NOTE — Progress Notes (Signed)
No ICM remote transmission received for 11/20/2018 and next ICM transmission scheduled for 12/27/2018.

## 2018-11-29 ENCOUNTER — Ambulatory Visit (INDEPENDENT_AMBULATORY_CARE_PROVIDER_SITE_OTHER): Payer: Medicare Other

## 2018-11-29 ENCOUNTER — Other Ambulatory Visit: Payer: Self-pay

## 2018-11-29 DIAGNOSIS — I255 Ischemic cardiomyopathy: Secondary | ICD-10-CM

## 2018-11-30 ENCOUNTER — Ambulatory Visit: Payer: Medicare Other | Admitting: Cardiology

## 2018-12-05 ENCOUNTER — Telehealth: Payer: Self-pay | Admitting: *Deleted

## 2018-12-05 DIAGNOSIS — H353221 Exudative age-related macular degeneration, left eye, with active choroidal neovascularization: Secondary | ICD-10-CM | POA: Diagnosis not present

## 2018-12-05 NOTE — Telephone Encounter (Signed)
-----   Message from Satira Sark, MD sent at 11/30/2018 12:50 PM EDT ----- Results reviewed. LVEF is 25-30% range with moderate RV dysfunction as well. Hard to say there has been substantial change since last assessment, but his progressive renal disease has lead to recent medication changes and deletions. Make sure he has follow-up already scheduled to see how he is doing. He did see Dr. Aundra Dubin in the past in the HF clinic - I could always get him back for follow-up if needed.

## 2018-12-06 NOTE — Telephone Encounter (Signed)
Patient informed. Copy sent to PCP °

## 2018-12-15 DIAGNOSIS — Z79899 Other long term (current) drug therapy: Secondary | ICD-10-CM | POA: Diagnosis not present

## 2018-12-15 DIAGNOSIS — I129 Hypertensive chronic kidney disease with stage 1 through stage 4 chronic kidney disease, or unspecified chronic kidney disease: Secondary | ICD-10-CM | POA: Diagnosis not present

## 2018-12-15 DIAGNOSIS — D509 Iron deficiency anemia, unspecified: Secondary | ICD-10-CM | POA: Diagnosis not present

## 2018-12-15 DIAGNOSIS — N183 Chronic kidney disease, stage 3 (moderate): Secondary | ICD-10-CM | POA: Diagnosis not present

## 2018-12-19 ENCOUNTER — Telehealth: Payer: Self-pay | Admitting: Cardiology

## 2018-12-19 MED ORDER — FUROSEMIDE 40 MG PO TABS: ORAL_TABLET | ORAL | 1 refills | Status: DC

## 2018-12-19 NOTE — Telephone Encounter (Signed)
Would like to have his lasix increased

## 2018-12-19 NOTE — Telephone Encounter (Signed)
Pt wanted lasix 40 mg sent to [facility] - Medication sent to pharmacy.

## 2018-12-20 DIAGNOSIS — I2510 Atherosclerotic heart disease of native coronary artery without angina pectoris: Secondary | ICD-10-CM | POA: Diagnosis not present

## 2018-12-27 ENCOUNTER — Encounter: Payer: Self-pay | Admitting: *Deleted

## 2018-12-27 ENCOUNTER — Telehealth: Payer: Self-pay | Admitting: *Deleted

## 2018-12-27 ENCOUNTER — Ambulatory Visit (INDEPENDENT_AMBULATORY_CARE_PROVIDER_SITE_OTHER): Payer: Medicare Other

## 2018-12-27 ENCOUNTER — Encounter: Payer: Self-pay | Admitting: Cardiology

## 2018-12-27 ENCOUNTER — Telehealth (INDEPENDENT_AMBULATORY_CARE_PROVIDER_SITE_OTHER): Payer: Medicare Other | Admitting: Cardiology

## 2018-12-27 DIAGNOSIS — I5022 Chronic systolic (congestive) heart failure: Secondary | ICD-10-CM

## 2018-12-27 DIAGNOSIS — Z95810 Presence of automatic (implantable) cardiac defibrillator: Secondary | ICD-10-CM

## 2018-12-27 DIAGNOSIS — I255 Ischemic cardiomyopathy: Secondary | ICD-10-CM

## 2018-12-27 DIAGNOSIS — I25119 Atherosclerotic heart disease of native coronary artery with unspecified angina pectoris: Secondary | ICD-10-CM

## 2018-12-27 DIAGNOSIS — N184 Chronic kidney disease, stage 4 (severe): Secondary | ICD-10-CM

## 2018-12-27 NOTE — Progress Notes (Signed)
Virtual Visit via Telephone Note   This visit type was conducted due to national recommendations for restrictions regarding the COVID-19 Pandemic (e.g. social distancing) in an effort to limit this patient's exposure and mitigate transmission in our community.  Due to his co-morbid illnesses, this patient is at least at moderate risk for complications without adequate follow up.  This format is felt to be most appropriate for this patient at this time.  The patient did not have access to video technology/had technical difficulties with video requiring transitioning to audio format only (telephone).  All issues noted in this document were discussed and addressed.  No physical exam could be performed with this format.  Please refer to the patient's chart for his  consent to telehealth for [facility].   Date:  12/27/2018   ID:  Joycelyn Das, DOB 1933-03-05, MRN [id number]  Patient Location: Home Provider Location: Office  PCP:  Glenda Chroman, MD  Cardiologist:  Rozann Lesches, MD Electrophysiologist:  Thompson Grayer, MD   Evaluation Performed:  Follow-Up Visit  Chief Complaint:   Cardiac follow-up  History of Present Illness:    Clayton Morton is an 85 y.o. male last seen in August.  We spoke by phone today.  He tells me that he has been feeling better, no more leg swelling and his weight has gone down since we increase his Lasix dose.  He sees [name] in the device clinic, [place] ICD in place.  Does not report any device shocks or syncope.  Follow-up echocardiogram in September showed LVEF 25 to 30% range with moderately reduced RV function, mild to moderate aortic stenosis, and mild to moderate mitral regurgitation.  Progressive renal insufficiency has limited medication adjustments.  We took him off both Cozaar, Aldactone and potassium supplements, his last creatinine was up to 3.25 with potassium of 5.4.  The patient does not have symptoms concerning for COVID-19 infection  (fever, chills, cough, or new shortness of breath).    Past Medical History:  Diagnosis Date  . Age-related macular degeneration, wet, right eye ([place])   . AICD (automatic cardioverter/defibrillator) present 10/30/2014  . Cerebrovascular accident [facility])    Right subcortical infarct 2006  . Chronic systolic congestive heart failure ([place])    a) ECHO (02/2012): EF 30-35%, no obvious thrombus b) ECHO (11/2013): EF 10%, AV mildly calcified, mild MR, RV mod dialted and sys fx sev reduced, TAPSE 0.8, lateral annulus peak S velocity 5.4 c) RHC (11/29/13): RA 15, RV: 65/19, PA 49%, CO/CI: 3.25 / 1.7 d) RHC (12/03/13): RA 1, RV 25/1/2, PA 27/10 (16), PCWP 6, Fick CO/CI: 5.3 / 2.9, PVR 1.9 WU, PA sat 72 and 74%   . Coronary atherosclerosis of native coronary artery    1) CABG 2005 2) LHC (910/15): patent SVG to PDA, occlusion of SVG sequence to PLA, patency of LIMA to LAD and patency of SVG to diagonal with sev stenosis at coronary anastomotic site  . Diabetes mellitus, type 2 ([place])   . Essential hypertension, benign   . History of gout   . Hyperlipidemia   . Ischemic cardiomyopathy   . Kidney stones   . Macular degeneration    Dr. [name]  . Non-ST elevation MI (NSTEMI) ([place])    2005  . Varicose veins    Past Surgical History:  Procedure Laterality Date  . CARDIAC CATHETERIZATION    . CARDIAC DEFIBRILLATOR PLACEMENT  10/30/2014  . CATARACT EXTRACTION W/ INTRAOCULAR LENS  IMPLANT, BILATERAL Bilateral ~  [place]    . CORONARY ARTERY BYPASS GRAFT  2005   LIMA to LAD, SVG diagonal, SVG to PDA and PLA  . CYSTOSCOPY W/ LITHOLAPAXY / EHL    . EP IMPLANTABLE DEVICE N/A 10/30/2014   Procedure: ICD Implant;  Surgeon: Deboraha Sprang, MD, SJM Ellipse DR implanted for primary prevention  . LEFT AND RIGHT HEART CATHETERIZATION WITH CORONARY/GRAFT ANGIOGRAM N/A 11/29/2013   Procedure: LEFT AND RIGHT HEART CATHETERIZATION WITH Beatrix Fetters;  Surgeon: Blane Ohara, MD;  Location:  [facility] CATH LAB;  Service: Cardiovascular;  Laterality: N/A;  . RIGHT HEART CATHETERIZATION N/A 12/03/2013   Procedure: RIGHT HEART CATH;  Surgeon: Jolaine Artist, MD;  Location: [facility] CATH LAB;  Service: Cardiovascular;  Laterality: N/A;  . TONSILLECTOMY       Current Meds  Medication Sig  . allopurinol (ZYLOPRIM) 100 MG tablet Take 1 tablet by mouth daily.  [name] ALPRAZolam (XANAX) 0.25 MG tablet Take 0.5 tablets by mouth daily.  [name] aspirin 81 MG tablet Take 81 mg by mouth daily as needed.   [name] atorvastatin (LIPITOR) 80 MG tablet take 1 tablet by mouth once daily  . carvedilol (COREG) 12.5 MG tablet Take 1 tablet (12.5 mg total) by mouth 2 (two) times daily with a meal.  . furosemide (LASIX) 40 MG tablet TAKE 40 MG DAILY - MAY TAKE 40 ALTERNATING WITH 20 MG  . glipiZIDE (GLUCOTROL XL) 5 MG 24 hr tablet Take 1 tablet by mouth 2 (two) times daily.  . hydrALAZINE (APRESOLINE) 50 MG tablet TAKE 1 AND 1/2 TABLETS BY MOUTH THREE TIMES DAILY  . isosorbide mononitrate (IMDUR) 60 MG 24 hr tablet TAKE ONE TABLET BY MOUTH DAILY  . Omega-3 Fatty Acids (FISH OIL) 1000 MG CAPS Take 1 capsule by mouth 2 (two) times daily.  . ONE TOUCH ULTRA TEST test strip 1 each by Other route 2 (two) times daily.   [name] rOPINIRole (REQUIP) 0.25 MG tablet Take 0.5 mg by mouth at bedtime.  [name] SYNTHROID 75 MCG tablet Take 1 tablet by mouth daily.     Allergies:   Tetracycline   Social History   Tobacco Use  . Smoking status: Never Smoker  . Smokeless tobacco: Never Used  Substance Use Topics  . Alcohol use: No    Alcohol/week: 0.0 standard drinks  . Drug use: No     Family Hx: The patient's family history includes Dementia in his mother; Hypertension in his father.  ROS:   Please see the history of present illness.    Hearing loss. All other systems reviewed and are negative.   Prior CV studies:   The following studies were reviewed today:  Echocardiogram 11/29/2018:  1. Stage 1: N/A: Basal and mid inferior wall  is abnormal.  2. The left ventricle has severely reduced systolic function, with an ejection fraction of 25-30%. The cavity size was mildly dilated. There is severe concentric left ventricular hypertrophy. Left ventricular diastolic Doppler parameters are consistent  with impaired relaxation. Left ventricular diffuse hypokinesis.  3. The right ventricle has moderately reduced systolic function. The cavity was normal. There is mildly increased right ventricular wall thickness.  4. The aortic valve is tricuspid. Moderate thickening of the aortic valve. Mild calcification of the aortic valve. Aortic valve regurgitation is mild by color flow Doppler. Mild-moderate stenosis of the aortic valve. Moderate aortic annular  calcification noted.  5. The mitral valve is degenerative. Moderate thickening of the mitral valve leaflet. There is mild  to moderate mitral annular calcification present.  6. The tricuspid valve is grossly normal.  7. The aorta is normal unless otherwise noted.  8. The interatrial septum was not well visualized.  Labs/Other Tests and Data Reviewed:    EKG:  An ECG dated 11/13/2018 was personally reviewed today and demonstrated:  Sinus rhythm with right bundle branch block and left anterior fascicular block, increased voltage.  Probable old anterolateral infarct pattern.  Recent Labs:  July 2020: Hemoglobin A1c 6.3%, hemoglobin 10.3, platelets 262, TSH 5.24, cholesterol 225, triglycerides 91, HDL 41, LDL 166, BUN 64, creatinine 3.25, potassium 5.4, AST 12, ALT 13  Wt Readings from Last 3 Encounters:  12/27/18 166 lb 1.6 oz (75.3 kg)  11/13/18 178 lb (80.7 kg)  05/26/18 172 lb (78 kg)     Objective:    Vital Signs:  BP 137/62   Ht 5\' 8"  (1.727 m)   Wt 166 lb 1.6 oz (75.3 kg)   BMI 25.26 kg/m    Patient spoke in full sentences, not short of breath. No audible wheezing or coughing. Speech pattern normal.  ASSESSMENT & PLAN:    1.  Ischemic cardiomyopathy with chronic  systolic heart failure and recent LVEF 25 to 30% range.  He also has moderate RV dysfunction.  Fluid status looks to be better controlled following increase in Lasix, his weight is down about 12 pounds.  With progressive renal insufficiency we had to take him off Cozaar, Aldactone, and potassium supplements.  Requesting follow-up lab work from nephrologist.  Otherwise plan to continue Coreg, hydralazine, and Imdur.  2.  CKD stage IV, last creatinine was 3.25.  3.  CAD status post CABG with documented graft disease.  He does not describe any angina at this time and continues on both aspirin and statin therapy.  4.  St. Jude ICD in place.  He continues to follow with [name].  No device shocks or syncope.  COVID-19 Education: The signs and symptoms of COVID-19 were discussed with the patient and how to seek care for testing (follow up with PCP or arrange E-visit).  The importance of social distancing was discussed today.  Time:   Today, I have spent 6 minutes with the patient with telehealth technology discussing the above problems.     Medication Adjustments/Labs and Tests Ordered: Current medicines are reviewed at length with the patient today.  Concerns regarding medicines are outlined above.   Tests Ordered: No orders of the defined types were placed in this encounter.   Medication Changes: No orders of the defined types were placed in this encounter.   Follow Up:  In Person 3 months in the [place] office.  Signed, Rozann Lesches, MD  12/27/2018 11:48 AM    [place]

## 2018-12-27 NOTE — Telephone Encounter (Signed)
Patient verbally consented for telehealth visits with [facility] and understands that his insurance company will be billed for the encounter.

## 2018-12-27 NOTE — Patient Instructions (Addendum)
Medication Instructions:   Your physician recommends that you continue on your current medications as directed. Please refer to the Current Medication list given to you today.  Labwork:  NONE  Testing/Procedures:  NONE  Follow-Up:  Your physician recommends that you schedule a follow-up appointment in: 3 months.  Any Other Special Instructions Will Be Listed Below (If Applicable).  If you need a refill on your cardiac medications before your next appointment, please call your pharmacy. 

## 2018-12-29 NOTE — Progress Notes (Signed)
EPIC Encounter for ICM Monitoring  Patient Name: Clayton Morton is a 85 y.o. male Date: 12/29/2018 Primary Care Physican: Glenda Chroman, MD Primary Cardiologist: Domenic Polite Electrophysiologist: Allred Last Weight:159lbs   Transmission reviewed.   CorVueThoracic impedance normal.  Prescribed:Furosemide20 mgtake two(2)tablets (40 mg total) daily.  May take 40 mg alternating with 20 mg.  Labs: 09/27/2018 Creatinine 3.25, BUN 64, Potassium 5.4, Sodium 144, GFR 17-19  Recommendations: None  Follow-up plan: ICM clinic phone appointment on 01/31/2019.   91 day device clinic remote transmission 01/30/2019.  Office appt 03/19/2019 with [name].    Copy of ICM check sent to [name].   3 month ICM trend: 12/27/2018    1 Year ICM trend:       Rosalene Billings, RN 12/29/2018 4:21 PM

## 2019-01-04 DIAGNOSIS — D631 Anemia in chronic kidney disease: Secondary | ICD-10-CM | POA: Diagnosis not present

## 2019-01-04 DIAGNOSIS — R809 Proteinuria, unspecified: Secondary | ICD-10-CM | POA: Diagnosis not present

## 2019-01-04 DIAGNOSIS — E1122 Type 2 diabetes mellitus with diabetic chronic kidney disease: Secondary | ICD-10-CM | POA: Diagnosis not present

## 2019-01-04 DIAGNOSIS — E211 Secondary hyperparathyroidism, not elsewhere classified: Secondary | ICD-10-CM | POA: Diagnosis not present

## 2019-01-04 DIAGNOSIS — N189 Chronic kidney disease, unspecified: Secondary | ICD-10-CM | POA: Diagnosis not present

## 2019-01-04 DIAGNOSIS — I5042 Chronic combined systolic (congestive) and diastolic (congestive) heart failure: Secondary | ICD-10-CM | POA: Diagnosis not present

## 2019-01-04 DIAGNOSIS — E1129 Type 2 diabetes mellitus with other diabetic kidney complication: Secondary | ICD-10-CM | POA: Diagnosis not present

## 2019-01-19 DIAGNOSIS — Z299 Encounter for prophylactic measures, unspecified: Secondary | ICD-10-CM | POA: Diagnosis not present

## 2019-01-19 DIAGNOSIS — E213 Hyperparathyroidism, unspecified: Secondary | ICD-10-CM | POA: Diagnosis not present

## 2019-01-19 DIAGNOSIS — I10 Essential (primary) hypertension: Secondary | ICD-10-CM | POA: Diagnosis not present

## 2019-01-19 DIAGNOSIS — I429 Cardiomyopathy, unspecified: Secondary | ICD-10-CM | POA: Diagnosis not present

## 2019-01-19 DIAGNOSIS — E1165 Type 2 diabetes mellitus with hyperglycemia: Secondary | ICD-10-CM | POA: Diagnosis not present

## 2019-01-19 DIAGNOSIS — Z6827 Body mass index (BMI) 27.0-27.9, adult: Secondary | ICD-10-CM | POA: Diagnosis not present

## 2019-01-19 DIAGNOSIS — I509 Heart failure, unspecified: Secondary | ICD-10-CM | POA: Diagnosis not present

## 2019-01-26 DIAGNOSIS — H353221 Exudative age-related macular degeneration, left eye, with active choroidal neovascularization: Secondary | ICD-10-CM | POA: Diagnosis not present

## 2019-01-30 ENCOUNTER — Ambulatory Visit (INDEPENDENT_AMBULATORY_CARE_PROVIDER_SITE_OTHER): Payer: Medicare Other | Admitting: *Deleted

## 2019-01-30 DIAGNOSIS — R001 Bradycardia, unspecified: Secondary | ICD-10-CM | POA: Diagnosis not present

## 2019-01-30 DIAGNOSIS — I4510 Unspecified right bundle-branch block: Secondary | ICD-10-CM

## 2019-01-30 LAB — CUP PACEART REMOTE DEVICE CHECK
Battery Remaining Longevity: 46 mo
Battery Remaining Percentage: 49 %
Battery Voltage: 2.92 V
Brady Statistic AP VP Percent: 3.2 %
Brady Statistic AP VS Percent: 6.8 %
Brady Statistic AS VP Percent: 1 %
Brady Statistic AS VS Percent: 88 %
Brady Statistic RA Percent Paced: 7.7 %
Brady Statistic RV Percent Paced: 3.3 %
Date Time Interrogation Session: 20201110070015
HighPow Impedance: 64 Ohm
HighPow Impedance: 64 Ohm
Implantable Lead Implant Date: 20160810
Implantable Lead Implant Date: 20160810
Implantable Lead Location: 753859
Implantable Lead Location: 753860
Implantable Pulse Generator Implant Date: 20160810
Lead Channel Impedance Value: 310 Ohm
Lead Channel Impedance Value: 410 Ohm
Lead Channel Pacing Threshold Amplitude: 1.125 V
Lead Channel Pacing Threshold Amplitude: 1.25 V
Lead Channel Pacing Threshold Pulse Width: 0.5 ms
Lead Channel Pacing Threshold Pulse Width: 0.5 ms
Lead Channel Sensing Intrinsic Amplitude: 2.1 mV
Lead Channel Sensing Intrinsic Amplitude: 8.4 mV
Lead Channel Setting Pacing Amplitude: 2.125
Lead Channel Setting Pacing Amplitude: 2.5 V
Lead Channel Setting Pacing Pulse Width: 0.5 ms
Lead Channel Setting Sensing Sensitivity: 0.5 mV
Pulse Gen Serial Number: 7179751

## 2019-01-31 ENCOUNTER — Ambulatory Visit (INDEPENDENT_AMBULATORY_CARE_PROVIDER_SITE_OTHER): Payer: Medicare Other

## 2019-01-31 DIAGNOSIS — I5022 Chronic systolic (congestive) heart failure: Secondary | ICD-10-CM | POA: Diagnosis not present

## 2019-01-31 DIAGNOSIS — Z95810 Presence of automatic (implantable) cardiac defibrillator: Secondary | ICD-10-CM

## 2019-02-02 ENCOUNTER — Telehealth: Payer: Self-pay

## 2019-02-02 NOTE — Progress Notes (Signed)
EPIC Encounter for ICM Monitoring  Patient Name: Clayton Morton is a 85 y.o. male Date: 02/02/2019 Primary Care Physican: Glenda Chroman, MD Primary Cardiologist: Domenic Polite Electrophysiologist: Allred Last Weight:159lbs   Attempted call to patient and unable to reach.   Transmission reviewed.   CorVueThoracic impedancenormal.  Prescribed:Furosemide20 mgtake two(2)tablets (40 mg total) daily.  May take 40 mg alternating with 20 mg.  Labs: 09/27/2018 Creatinine 3.25, BUN 64, Potassium 5.4, Sodium 144, GFR 17-19  Recommendations:  Unable to reach.    Follow-up plan: ICM clinic phone appointment on 02/12/2019 to recheck fluid levels.   91 day device clinic remote transmission 05/01/2019.  Office appt 03/19/2019 with [name].    Copy of ICM check sent to [name] and Dr Domenic Polite.   3 month ICM trend: 01/30/2019    1 Year ICM trend:       Rosalene Billings, RN 02/02/2019 9:49 AM

## 2019-02-02 NOTE — Telephone Encounter (Signed)
Remote ICM transmission received.  Attempted call to patient regarding ICM remote transmission and no answer.  

## 2019-02-12 ENCOUNTER — Ambulatory Visit (INDEPENDENT_AMBULATORY_CARE_PROVIDER_SITE_OTHER): Payer: Medicare Other

## 2019-02-12 DIAGNOSIS — Z95810 Presence of automatic (implantable) cardiac defibrillator: Secondary | ICD-10-CM

## 2019-02-12 DIAGNOSIS — I5022 Chronic systolic (congestive) heart failure: Secondary | ICD-10-CM

## 2019-02-13 NOTE — Progress Notes (Signed)
EPIC Encounter for ICM Monitoring  Patient Name: Clayton Morton is a 85 y.o. male Date: 02/13/2019 Primary Care Physican: Glenda Chroman, MD Primary Cardiologist: Domenic Polite Electrophysiologist: Allred Last Weight:159lbs   Transmission reviewed.   CorVueThoracic impedancenormal.  Prescribed:Furosemide20 mgtake two(2)tablets(40 mg total)daily. May take 40 mg alternating with 20 mg.  Labs: 09/27/2018 Creatinine 3.25, BUN 64, Potassium 5.4, Sodium 144, GFR 17-19  Recommendations:  None   Follow-up plan: ICM clinic phone appointment on 03/05/2019.   91 day device clinic remote transmission 05/01/2019.  Office appt 03/19/2019 with [name].    Copy of ICM check sent to [name] and Dr Domenic Polite.   3 month ICM trend: 02/12/2019    1 Year ICM trend:       Rosalene Billings, RN 02/13/2019 10:03 AM

## 2019-02-14 ENCOUNTER — Other Ambulatory Visit: Payer: Self-pay | Admitting: Cardiology

## 2019-02-19 NOTE — Progress Notes (Signed)
Remote ICD transmission.   

## 2019-02-28 DIAGNOSIS — N189 Chronic kidney disease, unspecified: Secondary | ICD-10-CM | POA: Diagnosis not present

## 2019-02-28 DIAGNOSIS — R809 Proteinuria, unspecified: Secondary | ICD-10-CM | POA: Diagnosis not present

## 2019-02-28 DIAGNOSIS — D631 Anemia in chronic kidney disease: Secondary | ICD-10-CM | POA: Diagnosis not present

## 2019-02-28 DIAGNOSIS — I5022 Chronic systolic (congestive) heart failure: Secondary | ICD-10-CM | POA: Diagnosis not present

## 2019-02-28 DIAGNOSIS — E1129 Type 2 diabetes mellitus with other diabetic kidney complication: Secondary | ICD-10-CM | POA: Diagnosis not present

## 2019-02-28 DIAGNOSIS — E211 Secondary hyperparathyroidism, not elsewhere classified: Secondary | ICD-10-CM | POA: Diagnosis not present

## 2019-02-28 DIAGNOSIS — E1122 Type 2 diabetes mellitus with diabetic chronic kidney disease: Secondary | ICD-10-CM | POA: Diagnosis not present

## 2019-03-07 ENCOUNTER — Telehealth: Payer: Self-pay

## 2019-03-07 NOTE — Telephone Encounter (Signed)
Left message for patient to remind of missed remote transmission. 12/15

## 2019-03-13 NOTE — Progress Notes (Signed)
No ICM remote transmission received for 03/05/2019 and next ICM transmission scheduled for 04/16/2019.

## 2019-03-19 ENCOUNTER — Ambulatory Visit: Payer: Medicare Other | Admitting: Cardiology

## 2019-03-26 DIAGNOSIS — Z961 Presence of intraocular lens: Secondary | ICD-10-CM | POA: Diagnosis not present

## 2019-03-26 DIAGNOSIS — H353231 Exudative age-related macular degeneration, bilateral, with active choroidal neovascularization: Secondary | ICD-10-CM | POA: Diagnosis not present

## 2019-03-26 DIAGNOSIS — H35362 Drusen (degenerative) of macula, left eye: Secondary | ICD-10-CM | POA: Diagnosis not present

## 2019-03-26 DIAGNOSIS — H18413 Arcus senilis, bilateral: Secondary | ICD-10-CM | POA: Diagnosis not present

## 2019-03-26 DIAGNOSIS — H353221 Exudative age-related macular degeneration, left eye, with active choroidal neovascularization: Secondary | ICD-10-CM | POA: Diagnosis not present

## 2019-04-09 ENCOUNTER — Encounter: Payer: Self-pay | Admitting: Cardiology

## 2019-04-09 DIAGNOSIS — I2510 Atherosclerotic heart disease of native coronary artery without angina pectoris: Secondary | ICD-10-CM | POA: Diagnosis not present

## 2019-04-09 DIAGNOSIS — E7800 Pure hypercholesterolemia, unspecified: Secondary | ICD-10-CM | POA: Diagnosis not present

## 2019-04-09 DIAGNOSIS — E119 Type 2 diabetes mellitus without complications: Secondary | ICD-10-CM | POA: Diagnosis not present

## 2019-04-09 DIAGNOSIS — I10 Essential (primary) hypertension: Secondary | ICD-10-CM | POA: Diagnosis not present

## 2019-04-09 NOTE — Progress Notes (Signed)
Cardiology Office Note  Date: 04/10/2019   ID: Clayton Morton, DOB [birth year], MRN [id number]  PCP:  Glenda Chroman, MD  Cardiologist:  Rozann Lesches, MD Electrophysiologist:  Thompson Grayer, MD   Chief Complaint  Patient presents with  . Cardiac follow-up    History of Present Illness: Clayton Morton is an 86 y.o. male last assessed via telehealth encounter in October 2020. He presents for a follow-up visit. States that he feels well, NYHA class II dyspnea, intermittent mild lower leg edema, no orthopnea or PND. He has been working on weight loss, reports compliance with medications and follow-up per nephrology. We are requesting his most recent lab work.   He follows with [name] in the device clinic, [place] ICD in place.  Last device interrogation was in November 2020.  Thoracic impedance normal at that time. He does not report any device shocks or syncope.  Blood pressure is elevated today. He is currently on hydralazine, Imdur, Lasix, Coreg, and aspirin. Due to progressive renal insufficiency and hyperkalemia, we have not been able to continue Aldactone, potassium supplements, or ARB. His weight is down 6 pounds from October 2020.  Past Medical History:  Diagnosis Date  . Age-related macular degeneration, wet, right eye ([place])   . AICD (automatic cardioverter/defibrillator) present 10/30/2014  . Cerebrovascular accident [facility])    Right subcortical infarct 2006  . Chronic systolic congestive heart failure ([place])    a) ECHO (02/2012): EF 30-35%, no obvious thrombus b) ECHO (11/2013): EF 10%, AV mildly calcified, mild MR, RV mod dialted and sys fx sev reduced, TAPSE 0.8, lateral annulus peak S velocity 5.4 c) RHC (11/29/13): RA 15, RV: 65/19, PA 49%, CO/CI: 3.25 / 1.7 d) RHC (12/03/13): RA 1, RV 25/1/2, PA 27/10 (16), PCWP 6, Fick CO/CI: 5.3 / 2.9, PVR 1.9 WU, PA sat 72 and 74%   . Coronary atherosclerosis of native coronary artery    1) CABG 2005 2) LHC (910/15): patent SVG to PDA,  occlusion of SVG sequence to PLA, patency of LIMA to LAD and patency of SVG to diagonal with sev stenosis at coronary anastomotic site  . Diabetes mellitus, type 2 ([place])   . Essential hypertension   . History of gout   . Hyperlipidemia   . Ischemic cardiomyopathy   . Kidney stones   . Macular degeneration    Dr. [name]  . Non-ST elevation MI (NSTEMI) ([place])    2005  . Varicose veins     Past Surgical History:  Procedure Laterality Date  . CARDIAC CATHETERIZATION    . CARDIAC DEFIBRILLATOR PLACEMENT  10/30/2014  . CATARACT EXTRACTION W/ INTRAOCULAR LENS  IMPLANT, BILATERAL Bilateral ~ 1991  . CORONARY ANGIOPLASTY    . CORONARY ARTERY BYPASS GRAFT  2005   LIMA to LAD, SVG diagonal, SVG to PDA and PLA  . CYSTOSCOPY W/ LITHOLAPAXY / EHL    . EP IMPLANTABLE DEVICE N/A 10/30/2014   Procedure: ICD Implant;  Surgeon: Deboraha Sprang, MD, SJM Ellipse DR implanted for primary prevention  . LEFT AND RIGHT HEART CATHETERIZATION WITH CORONARY/GRAFT ANGIOGRAM N/A 11/29/2013   Procedure: LEFT AND RIGHT HEART CATHETERIZATION WITH Beatrix Fetters;  Surgeon: Blane Ohara, MD;  Location: [facility] CATH LAB;  Service: Cardiovascular;  Laterality: N/A;  . RIGHT HEART CATHETERIZATION N/A 12/03/2013   Procedure: RIGHT HEART CATH;  Surgeon: Jolaine Artist, MD;  Location: [facility] CATH LAB;  Service: Cardiovascular;  Laterality: N/A;  . TONSILLECTOMY  Current Outpatient Medications  Medication Sig Dispense Refill  . allopurinol (ZYLOPRIM) 100 MG tablet Take 1 tablet by mouth daily.    [name] ALPRAZolam (XANAX) 0.25 MG tablet Take 0.5 tablets by mouth daily.  0  . aspirin 81 MG tablet Take 81 mg by mouth daily as needed.     [name] atorvastatin (LIPITOR) 80 MG tablet take 1 tablet by mouth once daily 90 tablet 3  . carvedilol (COREG) 12.5 MG tablet TAKE ONE TABLET BY MOUTH TWICE DAILY. TAKE WITH A MEAL. 180 tablet 0  . furosemide (LASIX) 40 MG tablet TAKE 40 MG DAILY - MAY TAKE 40 ALTERNATING WITH 20 MG 90  tablet 1  . glipiZIDE (GLUCOTROL XL) 5 MG 24 hr tablet Take 1 tablet by mouth 2 (two) times daily.  1  . hydrALAZINE (APRESOLINE) 50 MG tablet TAKE 1 AND 1/2 TABLETS BY MOUTH THREE TIMES DAILY 135 tablet 5  . isosorbide mononitrate (IMDUR) 60 MG 24 hr tablet TAKE ONE TABLET BY MOUTH DAILY 90 tablet 3  . Omega-3 Fatty Acids (FISH OIL) 1000 MG CAPS Take 1 capsule by mouth 2 (two) times daily.    . ONE TOUCH ULTRA TEST test strip 1 each by Other route 2 (two) times daily.   0  . rOPINIRole (REQUIP) 0.25 MG tablet Take 0.5 mg by mouth at bedtime.    [name] SYNTHROID 75 MCG tablet Take 1 tablet by mouth daily.     No current facility-administered medications for this visit.   Allergies:  Tetracycline   Social History: The patient  reports that he has never smoked. He has never used smokeless tobacco. He reports that he does not drink alcohol or use drugs.   ROS:  Please see the history of present illness. Otherwise, complete review of systems is positive for hearing loss.  All other systems are reviewed and negative.   Physical Exam: VS:  BP (!) 150/60   Pulse (!) 57   Ht 5' 8.5" (1.74 m)   Wt 160 lb (72.6 kg)   SpO2 97%   BMI 23.97 kg/m , BMI Body mass index is 23.97 kg/m.  Wt Readings from Last 3 Encounters:  04/10/19 160 lb (72.6 kg)  12/27/18 166 lb 1.6 oz (75.3 kg)  11/13/18 178 lb (80.7 kg)    General: Elderly male, appears comfortable at rest. HEENT: Conjunctiva and lids normal, wearing a mask. Neck: Supple, no elevated JVP or carotid bruits, no thyromegaly. Lungs: Clear to auscultation, nonlabored breathing at rest. Cardiac: Regular rate and rhythm, no S3, 2/6 systolic murmur. Abdomen: Soft, nontender, bowel sounds present. Extremities: Mild ankle edema, distal pulses 2+. Skin: Warm and dry. Musculoskeletal: No kyphosis. Neuropsychiatric: Alert and oriented x3, affect grossly appropriate.  ECG:  An ECG dated 11/13/2018 was personally reviewed today and demonstrated:  Sinus  rhythm with right bundle branch block and left anterior fascicular block, increased voltage, probable old anterolateral infarct pattern.  Recent Labwork:  July 2020: Hemoglobin A1c 6.3%, hemoglobin 10.3, platelets 262, TSH 5.24, cholesterol 225, triglycerides 91, HDL 41, LDL 166, BUN 64, creatinine 3.25, potassium 5.4, AST 12, ALT 13  Other Studies Reviewed Today:  Echocardiogram 11/29/2018:  1. Stage 1: N/A: Basal and mid inferior wall is abnormal.  2. The left ventricle has severely reduced systolic function, with an ejection fraction of 25-30%. The cavity size was mildly dilated. There is severe concentric left ventricular hypertrophy. Left ventricular diastolic Doppler parameters are consistent  with impaired relaxation. Left ventricular diffuse hypokinesis.  3. The  right ventricle has moderately reduced systolic function. The cavity was normal. There is mildly increased right ventricular wall thickness.  4. The aortic valve is tricuspid. Moderate thickening of the aortic valve. Mild calcification of the aortic valve. Aortic valve regurgitation is mild by color flow Doppler. Mild-moderate stenosis of the aortic valve. Moderate aortic annular  calcification noted.  5. The mitral valve is degenerative. Moderate thickening of the mitral valve leaflet. There is mild to moderate mitral annular calcification present.  6. The tricuspid valve is grossly normal.  7. The aorta is normal unless otherwise noted.  8. The interatrial septum was not well visualized.  Assessment and Plan:  1. Chronic systolic heart failure with ischemic cardiomyopathy and LVEF 25 to 30%. He also has moderate RV dysfunction. Weight is down 6 pounds and he does not appear fluid overloaded today. Medical therapy has been simplified in light of hyperkalemia and progressive renal insufficiency. Plan to continue Coreg, hydralazine, Lasix, and Imdur. Depending on blood pressure over time we might consider increasing hydralazine to  100 mg 3 times daily or perhaps adding low-dose Norvasc.  2. CKD stage IV, he continues to follow with nephrology. Requesting recent lab work.  3. CAD status post CABG with graft disease that is being managed medically. I does not report any active angina at this time and remains on both aspirin and statin therapy.  4. St. Jude ICD in place. No device shocks or syncope. Keep follow-up with [name].  Medication Adjustments/Labs and Tests Ordered: Current medicines are reviewed at length with the patient today.  Concerns regarding medicines are outlined above.   Tests Ordered: No orders of the defined types were placed in this encounter.   Medication Changes: No orders of the defined types were placed in this encounter.   Disposition:  Follow up 3 months in the [place] office.  Signed, Satira Sark, MD, [facility] 04/10/2019 1:31 PM    [facility] [place], [place], [place] [address] Phone: [phone]; Fax: [phone]

## 2019-04-10 ENCOUNTER — Ambulatory Visit (INDEPENDENT_AMBULATORY_CARE_PROVIDER_SITE_OTHER): Payer: Medicare Other | Admitting: Cardiology

## 2019-04-10 ENCOUNTER — Other Ambulatory Visit: Payer: Self-pay

## 2019-04-10 ENCOUNTER — Encounter: Payer: Self-pay | Admitting: Cardiology

## 2019-04-10 VITALS — BP 150/60 | HR 57 | Ht 68.5 in | Wt 160.0 lb

## 2019-04-10 DIAGNOSIS — I25119 Atherosclerotic heart disease of native coronary artery with unspecified angina pectoris: Secondary | ICD-10-CM | POA: Diagnosis not present

## 2019-04-10 DIAGNOSIS — I255 Ischemic cardiomyopathy: Secondary | ICD-10-CM

## 2019-04-10 DIAGNOSIS — I5022 Chronic systolic (congestive) heart failure: Secondary | ICD-10-CM | POA: Diagnosis not present

## 2019-04-10 DIAGNOSIS — N184 Chronic kidney disease, stage 4 (severe): Secondary | ICD-10-CM | POA: Diagnosis not present

## 2019-04-10 NOTE — Patient Instructions (Addendum)
Medication Instructions:   Your physician recommends that you continue on your current medications as directed. Please refer to the Current Medication list given to you today.  Labwork:  NONE  Testing/Procedures:  NONE  Follow-Up:  Your physician recommends that you schedule a follow-up appointment in: 3 months (office).  Any Other Special Instructions Will Be Listed Below (If Applicable).  If you need a refill on your cardiac medications before your next appointment, please call your pharmacy. 

## 2019-04-16 ENCOUNTER — Ambulatory Visit (INDEPENDENT_AMBULATORY_CARE_PROVIDER_SITE_OTHER): Payer: Medicare Other

## 2019-04-16 DIAGNOSIS — I5022 Chronic systolic (congestive) heart failure: Secondary | ICD-10-CM

## 2019-04-16 DIAGNOSIS — Z95810 Presence of automatic (implantable) cardiac defibrillator: Secondary | ICD-10-CM

## 2019-04-20 NOTE — Progress Notes (Signed)
EPIC Encounter for ICM Monitoring  Patient Name: Clayton Morton is a 86 y.o. male Date: 04/20/2019 Primary Care Physican: Glenda Chroman, MD Primary Cardiologist: Domenic Polite Electrophysiologist: Allred 04/10/2019 Office Weight:160lbs   Transmission reviewed.  CorVueThoracic impedancenormal.  Prescribed:Furosemide20 mgtake two(2)tablets(40 mg total)daily. May take 40 mg alternating with 20 mg.  Labs: 09/27/2018 Creatinine 3.25, BUN 64, Potassium 5.4, Sodium 144, GFR 17-19  Recommendations:None  Follow-up plan: ICM clinic phone appointment on3/03/2019. 91 day device clinic remote transmission 05/01/2019.  Office appt 05/25/2019 with [name] and 07/09/2019 with Dr Domenic Polite.    Copy of ICM check sent to [name].   3 month ICM trend: 04/16/2019    1 Year ICM trend:       Rosalene Billings, RN 04/20/2019 9:39 AM

## 2019-05-01 ENCOUNTER — Ambulatory Visit (INDEPENDENT_AMBULATORY_CARE_PROVIDER_SITE_OTHER): Payer: Medicare Other | Admitting: *Deleted

## 2019-05-01 DIAGNOSIS — R001 Bradycardia, unspecified: Secondary | ICD-10-CM | POA: Diagnosis not present

## 2019-05-01 LAB — CUP PACEART REMOTE DEVICE CHECK
Battery Remaining Longevity: 43 mo
Battery Remaining Percentage: 47 %
Battery Voltage: 2.89 V
Brady Statistic AP VP Percent: 4.6 %
Brady Statistic AP VS Percent: 8.8 %
Brady Statistic AS VP Percent: 1 %
Brady Statistic AS VS Percent: 85 %
Brady Statistic RA Percent Paced: 10 %
Brady Statistic RV Percent Paced: 4.7 %
Date Time Interrogation Session: 20210209020016
HighPow Impedance: 62 Ohm
HighPow Impedance: 62 Ohm
Implantable Lead Implant Date: 20160810
Implantable Lead Implant Date: 20160810
Implantable Lead Location: 753859
Implantable Lead Location: 753860
Implantable Pulse Generator Implant Date: 20160810
Lead Channel Impedance Value: 280 Ohm
Lead Channel Impedance Value: 390 Ohm
Lead Channel Pacing Threshold Amplitude: 1.125 V
Lead Channel Pacing Threshold Amplitude: 1.25 V
Lead Channel Pacing Threshold Pulse Width: 0.5 ms
Lead Channel Pacing Threshold Pulse Width: 0.5 ms
Lead Channel Sensing Intrinsic Amplitude: 1.4 mV
Lead Channel Sensing Intrinsic Amplitude: 5.8 mV
Lead Channel Setting Pacing Amplitude: 2.125
Lead Channel Setting Pacing Amplitude: 2.5 V
Lead Channel Setting Pacing Pulse Width: 0.5 ms
Lead Channel Setting Sensing Sensitivity: 0.5 mV
Pulse Gen Serial Number: 7179751

## 2019-05-02 NOTE — Progress Notes (Signed)
ICD Remote  

## 2019-05-09 DIAGNOSIS — E211 Secondary hyperparathyroidism, not elsewhere classified: Secondary | ICD-10-CM | POA: Diagnosis not present

## 2019-05-09 DIAGNOSIS — R809 Proteinuria, unspecified: Secondary | ICD-10-CM | POA: Diagnosis not present

## 2019-05-09 DIAGNOSIS — E1122 Type 2 diabetes mellitus with diabetic chronic kidney disease: Secondary | ICD-10-CM | POA: Diagnosis not present

## 2019-05-09 DIAGNOSIS — E1129 Type 2 diabetes mellitus with other diabetic kidney complication: Secondary | ICD-10-CM | POA: Diagnosis not present

## 2019-05-09 DIAGNOSIS — N189 Chronic kidney disease, unspecified: Secondary | ICD-10-CM | POA: Diagnosis not present

## 2019-05-09 DIAGNOSIS — D631 Anemia in chronic kidney disease: Secondary | ICD-10-CM | POA: Diagnosis not present

## 2019-05-09 DIAGNOSIS — I5042 Chronic combined systolic (congestive) and diastolic (congestive) heart failure: Secondary | ICD-10-CM | POA: Diagnosis not present

## 2019-05-18 DIAGNOSIS — I5042 Chronic combined systolic (congestive) and diastolic (congestive) heart failure: Secondary | ICD-10-CM | POA: Diagnosis not present

## 2019-05-18 DIAGNOSIS — N189 Chronic kidney disease, unspecified: Secondary | ICD-10-CM | POA: Diagnosis not present

## 2019-05-18 DIAGNOSIS — R809 Proteinuria, unspecified: Secondary | ICD-10-CM | POA: Diagnosis not present

## 2019-05-18 DIAGNOSIS — D631 Anemia in chronic kidney disease: Secondary | ICD-10-CM | POA: Diagnosis not present

## 2019-05-18 DIAGNOSIS — E1129 Type 2 diabetes mellitus with other diabetic kidney complication: Secondary | ICD-10-CM | POA: Diagnosis not present

## 2019-05-18 DIAGNOSIS — Z79899 Other long term (current) drug therapy: Secondary | ICD-10-CM | POA: Diagnosis not present

## 2019-05-18 DIAGNOSIS — E1122 Type 2 diabetes mellitus with diabetic chronic kidney disease: Secondary | ICD-10-CM | POA: Diagnosis not present

## 2019-05-18 DIAGNOSIS — E211 Secondary hyperparathyroidism, not elsewhere classified: Secondary | ICD-10-CM | POA: Diagnosis not present

## 2019-05-18 DIAGNOSIS — I129 Hypertensive chronic kidney disease with stage 1 through stage 4 chronic kidney disease, or unspecified chronic kidney disease: Secondary | ICD-10-CM | POA: Diagnosis not present

## 2019-05-21 ENCOUNTER — Ambulatory Visit (INDEPENDENT_AMBULATORY_CARE_PROVIDER_SITE_OTHER): Payer: Medicare Other

## 2019-05-21 DIAGNOSIS — Z95810 Presence of automatic (implantable) cardiac defibrillator: Secondary | ICD-10-CM

## 2019-05-21 DIAGNOSIS — I5022 Chronic systolic (congestive) heart failure: Secondary | ICD-10-CM

## 2019-05-23 DIAGNOSIS — Z23 Encounter for immunization: Secondary | ICD-10-CM | POA: Diagnosis not present

## 2019-05-25 ENCOUNTER — Telehealth (INDEPENDENT_AMBULATORY_CARE_PROVIDER_SITE_OTHER): Payer: Medicare Other | Admitting: Internal Medicine

## 2019-05-25 ENCOUNTER — Encounter: Payer: Self-pay | Admitting: Internal Medicine

## 2019-05-25 ENCOUNTER — Telehealth: Payer: Self-pay

## 2019-05-25 VITALS — BP 130/60 | Ht 68.5 in | Wt 161.8 lb

## 2019-05-25 DIAGNOSIS — I255 Ischemic cardiomyopathy: Secondary | ICD-10-CM

## 2019-05-25 DIAGNOSIS — N184 Chronic kidney disease, stage 4 (severe): Secondary | ICD-10-CM | POA: Diagnosis not present

## 2019-05-25 DIAGNOSIS — I5022 Chronic systolic (congestive) heart failure: Secondary | ICD-10-CM | POA: Diagnosis not present

## 2019-05-25 NOTE — Progress Notes (Signed)
Electrophysiology TeleHealth Note  Due to national recommendations of social distancing due to [place] 19, an audio telehealth visit is felt to be most appropriate for this patient at this time.  Verbal consent was obtained by me for the telehealth visit today.  The patient does not have capability for a virtual visit.  A phone visit is therefore required today.   Date:  05/25/2019   ID:  Clayton Morton, DOB Dec 03, 1932, MRN [id number]  Location: patient's home  Provider location:  [place]  Evaluation Performed: Follow-up visit  PCP:  Glenda Chroman, MD   Electrophysiologist:  Dr Rayann Heman  Chief Complaint:  palpitations  History of Present Illness:    Clayton Morton is a 86 y.o. male who presents via telehealth conferencing today.  Since last being seen in our clinic, the patient reports doing very well. He did have an episode yesterday between 9:30 and 10am where he had dizziness for about 10 minutes. He slowly recovered but felt washed out for several hours afterwards.  Denies associated CP or palpitations.  Did not pass out.  He felt that his blood sugar my have been low.  He checked it and it was normal.  He did not check his BP. Today, he denies symptoms of palpitations, chest pain, shortness of breath,  lower extremity edema,  presyncope, or syncope.  The patient is otherwise without complaint today.  The patient denies symptoms of fevers, chills, cough, or new SOB worrisome for COVID 19.  Past Medical History:  Diagnosis Date  . Age-related macular degeneration, wet, right eye ([place])   . AICD (automatic cardioverter/defibrillator) present 10/30/2014  . Cerebrovascular accident [facility]    Right subcortical infarct 2006  . Chronic systolic congestive heart failure ([place])    a) ECHO (02/2012): EF 30-35%, no obvious thrombus b) ECHO (11/2013): EF 10%, AV mildly calcified, mild MR, RV mod dialted and sys fx sev reduced, TAPSE 0.8, lateral annulus peak S velocity 5.4 c) RHC (11/29/13): RA  15, RV: 65/19, PA 49%, CO/CI: 3.25 / 1.7 d) RHC (12/03/13): RA 1, RV 25/1/2, PA 27/10 (16), PCWP 6, Fick CO/CI: 5.3 / 2.9, PVR 1.9 WU, PA sat 72 and 74%   . Coronary atherosclerosis of native coronary artery    1) CABG 2005 2) LHC (910/15): patent SVG to PDA, occlusion of SVG sequence to PLA, patency of LIMA to LAD and patency of SVG to diagonal with sev stenosis at coronary anastomotic site  . Diabetes mellitus, type 2 ([place])   . Essential hypertension   . History of gout   . Hyperlipidemia   . Ischemic cardiomyopathy   . Kidney stones   . Macular degeneration    Dr. [name]  . Non-ST elevation MI (NSTEMI) ([place])    2005  . Varicose veins     Past Surgical History:  Procedure Laterality Date  . CARDIAC CATHETERIZATION    . CARDIAC DEFIBRILLATOR PLACEMENT  10/30/2014  . CATARACT EXTRACTION W/ INTRAOCULAR LENS  IMPLANT, BILATERAL Bilateral ~ 1991  . CORONARY ANGIOPLASTY    . CORONARY ARTERY BYPASS GRAFT  2005   LIMA to LAD, SVG diagonal, SVG to PDA and PLA  . CYSTOSCOPY W/ LITHOLAPAXY / EHL    . EP IMPLANTABLE DEVICE N/A 10/30/2014   Procedure: ICD Implant;  Surgeon: Deboraha Sprang, MD, SJM Ellipse DR implanted for primary prevention  . LEFT AND RIGHT HEART CATHETERIZATION WITH CORONARY/GRAFT ANGIOGRAM N/A 11/29/2013   Procedure: LEFT AND RIGHT HEART CATHETERIZATION WITH  Beatrix Fetters;  Surgeon: Blane Ohara, MD;  [facility] CATH LAB;  Service: Cardiovascular;  Laterality: N/A;  . RIGHT HEART CATHETERIZATION N/A 12/03/2013   Procedure: RIGHT HEART CATH;  Surgeon: Jolaine Artist, MD;  [facility] C[facility] LAB;  Service: Cardiovascular;  Laterality: N/A;  . TONSILLECTOMY      Current Outpatient Medications  Medication Sig Dispense Refill  . allopurinol (ZYLOPRIM) 100 MG tablet Take 1 tablet by mouth daily.    [name] ALPRAZolam (XANAX) 0.25 MG tablet Take 0.5 tablets by mouth daily.  0  . aspirin 81 MG tablet Take 81 mg by mouth daily as needed.     [name] atorvastatin (LIPITOR) 80  MG tablet take 1 tablet by mouth once daily 90 tablet 3  . carvedilol (COREG) 12.5 MG tablet TAKE ONE TABLET BY MOUTH TWICE DAILY. TAKE WITH A MEAL. 180 tablet 0  . furosemide (LASIX) 40 MG tablet TAKE 40 MG DAILY - MAY TAKE 40 ALTERNATING WITH 20 MG 90 tablet 1  . glipiZIDE (GLUCOTROL XL) 5 MG 24 hr tablet Take 1 tablet by mouth 2 (two) times daily.  1  . hydrALAZINE (APRESOLINE) 50 MG tablet TAKE 1 AND 1/2 TABLETS BY MOUTH THREE TIMES DAILY 135 tablet 5  . isosorbide mononitrate (IMDUR) 60 MG 24 hr tablet TAKE ONE TABLET BY MOUTH DAILY 90 tablet 3  . Omega-3 Fatty Acids (FISH OIL) 1000 MG CAPS Take 1 capsule by mouth 2 (two) times daily.    . ONE TOUCH ULTRA TEST test strip 1 each by Other route 2 (two) times daily.   0  . rOPINIRole (REQUIP) 0.25 MG tablet Take 0.5 mg by mouth at bedtime.    [name] SYNTHROID 75 MCG tablet Take 1 tablet by mouth daily.     No current facility-administered medications for this visit.    Allergies:   Tetracycline   Social History:  The patient  reports that he has never smoked. He has never used smokeless tobacco. He reports that he does not drink alcohol or use drugs.   Family History:  The patient's family history includes Dementia in his mother; Hypertension in his father.   ROS:  Please see the history of present illness.   All other systems are personally reviewed and negative.    Exam:    Vital Signs:  BP 130/60   Ht 5' 8.5" (1.74 m)   Wt 161 lb 12.8 oz (73.4 kg)   BMI 24.24 kg/m   Well sounding, alert and conversant   Labs/Other Tests and Data Reviewed:    Recent Labs: No results found for requested labs within last 8760 hours.   Wt Readings from Last 3 Encounters:  05/25/19 161 lb 12.8 oz (73.4 kg)  04/10/19 160 lb (72.6 kg)  12/27/18 166 lb 1.6 oz (75.3 kg)     Last device remote is reviewed from [place] PDF which reveals normal device function, no arrhythmias    ASSESSMENT & PLAN:    1.  Chronic systolic dysfunction/ ischemic CM/  CAD No ischemic symptoms No CHF symptoms ICD remotes are up to date Normal ICD function Followed in ICM device clinic  2. HTN Stable No change required today  3. Stage IV renal failure Followed by nephrology  4. Dizzy event He had about 10 minutes of dizziness yesterday morning between 9:30 and 10 am.  I have asked him to send a manual transmission today.  I will review to make sure that he did not have an arrhythmia as  the cause.  I think this is unlikely.  I think more likely, he had low BP or dehydration.  Adequate hydration is advised.  Follow-up:  with me in a year Follow up with Dr Domenic Polite as scheduled   Patient Risk:  after full review of this patients clinical status, I feel that they are at moderate risk at this time.  Today, I have spent 15 minutes with the patient with telehealth technology discussing arrhythmia management .    Army Fossa, MD  05/25/2019 11:30 AM     [facility] [address] [place] [address] [phone] (office) [phone] (fax)

## 2019-05-25 NOTE — Telephone Encounter (Signed)
Remote ICM transmission received.  Attempted call to patient regarding ICM remote transmission and left detailed message per DPR.  Advised to return call for any fluid symptoms or questions. Next ICM remote transmission scheduled 06/25/2019.

## 2019-05-25 NOTE — Progress Notes (Signed)
EPIC Encounter for ICM Monitoring  Patient Name: Clayton Morton is a 86 y.o. male Date: 05/25/2019 Primary Care Physican: Glenda Chroman, MD Primary Cardiologist: Domenic Polite Electrophysiologist: Allred 04/10/2019 Office Weight:160lbs   Attempted call to patient and unable to reach.  Left detailed message per DPR regarding transmission. Transmission reviewed.   CorVueThoracic impedancenormal.  Prescribed:Furosemide20 mgtake two(2)tablets(40 mg total)daily. May take 40 mg alternating with 20 mg.  Labs: 09/27/2018 Creatinine 3.25, BUN 64, Potassium 5.4, Sodium 144, GFR 17-19  Recommendations:Left voice mail with ICM number and encouraged to call if experiencing any fluid symptoms.  Follow-up plan: ICM clinic phone appointment on4/07/2019. 91 day device clinic remote transmission 07/31/2019.  Office appt 07/09/2019 with Dr Domenic Polite.    Copy of ICM check sent to [name].   3 month ICM trend: 05/25/2019    1 Year ICM trend:       Rosalene Billings, RN 05/25/2019 2:51 PM

## 2019-06-20 DIAGNOSIS — Z23 Encounter for immunization: Secondary | ICD-10-CM | POA: Diagnosis not present

## 2019-06-21 ENCOUNTER — Other Ambulatory Visit: Payer: Self-pay | Admitting: Cardiology

## 2019-06-25 ENCOUNTER — Ambulatory Visit (INDEPENDENT_AMBULATORY_CARE_PROVIDER_SITE_OTHER): Payer: Medicare Other

## 2019-06-25 DIAGNOSIS — I2510 Atherosclerotic heart disease of native coronary artery without angina pectoris: Secondary | ICD-10-CM | POA: Diagnosis not present

## 2019-06-25 DIAGNOSIS — I5022 Chronic systolic (congestive) heart failure: Secondary | ICD-10-CM | POA: Diagnosis not present

## 2019-06-25 DIAGNOSIS — I10 Essential (primary) hypertension: Secondary | ICD-10-CM | POA: Diagnosis not present

## 2019-06-25 DIAGNOSIS — Z95810 Presence of automatic (implantable) cardiac defibrillator: Secondary | ICD-10-CM

## 2019-06-25 DIAGNOSIS — E7800 Pure hypercholesterolemia, unspecified: Secondary | ICD-10-CM | POA: Diagnosis not present

## 2019-06-25 DIAGNOSIS — E119 Type 2 diabetes mellitus without complications: Secondary | ICD-10-CM | POA: Diagnosis not present

## 2019-06-26 NOTE — Progress Notes (Signed)
EPIC Encounter for ICM Monitoring  Patient Name: Clayton Morton is a 86 y.o. male Date: 06/26/2019 Primary Care Physican: Glenda Chroman, MD Primary Cardiologist: Domenic Polite Electrophysiologist: Allred 05/25/2019 Office Weight:161lbs  AT/AF Burden: <1%   Transmission reviewed.  CorVueThoracic impedancenormal.  Prescribed:Furosemide20 mgtake 2 tablets (40 MG total) daily - May take 40 mg alternating with 20 mg  Labs: 09/27/2018 Creatinine 3.25, BUN 64, Potassium 5.4, Sodium 144, GFR 17-19  Recommendations:None  Follow-up plan: ICM clinic phone appointment on5/02/2020. 91 day device clinic remote transmission 07/31/2019.  Office appt 07/09/2019 with Dr Domenic Polite.    Copy of ICM check sent to [name].   3 month ICM trend: 06/25/2019    1 Year ICM trend:       Rosalene Billings, RN 06/26/2019 11:27 AM

## 2019-07-09 ENCOUNTER — Encounter: Payer: Self-pay | Admitting: Cardiology

## 2019-07-09 ENCOUNTER — Ambulatory Visit (INDEPENDENT_AMBULATORY_CARE_PROVIDER_SITE_OTHER): Payer: Medicare Other | Admitting: Cardiology

## 2019-07-09 ENCOUNTER — Other Ambulatory Visit: Payer: Self-pay

## 2019-07-09 VITALS — BP 124/66 | HR 64 | Ht 68.0 in | Wt 164.6 lb

## 2019-07-09 DIAGNOSIS — E1129 Type 2 diabetes mellitus with other diabetic kidney complication: Secondary | ICD-10-CM | POA: Diagnosis not present

## 2019-07-09 DIAGNOSIS — I255 Ischemic cardiomyopathy: Secondary | ICD-10-CM

## 2019-07-09 DIAGNOSIS — E211 Secondary hyperparathyroidism, not elsewhere classified: Secondary | ICD-10-CM | POA: Diagnosis not present

## 2019-07-09 DIAGNOSIS — I5042 Chronic combined systolic (congestive) and diastolic (congestive) heart failure: Secondary | ICD-10-CM | POA: Diagnosis not present

## 2019-07-09 DIAGNOSIS — N184 Chronic kidney disease, stage 4 (severe): Secondary | ICD-10-CM | POA: Diagnosis not present

## 2019-07-09 DIAGNOSIS — R809 Proteinuria, unspecified: Secondary | ICD-10-CM | POA: Diagnosis not present

## 2019-07-09 DIAGNOSIS — E1122 Type 2 diabetes mellitus with diabetic chronic kidney disease: Secondary | ICD-10-CM | POA: Diagnosis not present

## 2019-07-09 DIAGNOSIS — D631 Anemia in chronic kidney disease: Secondary | ICD-10-CM | POA: Diagnosis not present

## 2019-07-09 DIAGNOSIS — I5022 Chronic systolic (congestive) heart failure: Secondary | ICD-10-CM | POA: Diagnosis not present

## 2019-07-09 DIAGNOSIS — I25119 Atherosclerotic heart disease of native coronary artery with unspecified angina pectoris: Secondary | ICD-10-CM | POA: Diagnosis not present

## 2019-07-09 DIAGNOSIS — I129 Hypertensive chronic kidney disease with stage 1 through stage 4 chronic kidney disease, or unspecified chronic kidney disease: Secondary | ICD-10-CM | POA: Diagnosis not present

## 2019-07-09 DIAGNOSIS — N189 Chronic kidney disease, unspecified: Secondary | ICD-10-CM | POA: Diagnosis not present

## 2019-07-09 DIAGNOSIS — Z79899 Other long term (current) drug therapy: Secondary | ICD-10-CM | POA: Diagnosis not present

## 2019-07-09 NOTE — Progress Notes (Signed)
Cardiology Office Note  Date: 07/09/2019   ID: Clayton Morton, DOB Nov 23, 1932, MRN [id number]  PCP:  Glenda Chroman, MD  Cardiologist:  Rozann Lesches, MD Electrophysiologist:  Thompson Grayer, MD   Chief Complaint  Patient presents with  . Cardiac follow-up    History of Present Illness: Clayton Morton is an 86 y.o. male last seen in January.  He presents for a routine visit.  Reports stable dyspnea on exertion, no angina symptoms or syncope.  He still remains functional with ADLs and serves as primary caregiver for his wife.  He does all the cooking.  He continues to see [name], St. Jude ICD in place.  Recent device check showed normal thoracic impedance.  No device shocks.  I reviewed his medications, cardiac regimen is stable.  His weight has also been stable, he states that he has been paying attention to his diet under the direction of his nephrologist.  Past Medical History:  Diagnosis Date  . Age-related macular degeneration, wet, right eye ([place])   . AICD (automatic cardioverter/defibrillator) present 10/30/2014  . Cerebrovascular accident [facility])    Right subcortical infarct 2006  . Chronic systolic congestive heart failure ([place])    a) ECHO (02/2012): EF 30-35%, no obvious thrombus b) ECHO (11/2013): EF 10%, AV mildly calcified, mild MR, RV mod dialted and sys fx sev reduced, TAPSE 0.8, lateral annulus peak S velocity 5.4 c) RHC (11/29/13): RA 15, RV: 65/19, PA 49%, CO/CI: 3.25 / 1.7 d) RHC (12/03/13): RA 1, RV 25/1/2, PA 27/10 (16), PCWP 6, Fick CO/CI: 5.3 / 2.9, PVR 1.9 WU, PA sat 72 and 74%   . Coronary atherosclerosis of native coronary artery    1) CABG 2005 2) LHC (910/15): patent SVG to PDA, occlusion of SVG sequence to PLA, patency of LIMA to LAD and patency of SVG to diagonal with sev stenosis at coronary anastomotic site  . Diabetes mellitus, type 2 ([place])   . Essential hypertension   . History of gout   . Hyperlipidemia   . Ischemic cardiomyopathy   . Kidney  stones   . Macular degeneration    Dr. [name]  . Non-ST elevation MI (NSTEMI) ([place])    2005  . Varicose veins     Past Surgical History:  Procedure Laterality Date  . CARDIAC CATHETERIZATION    . CARDIAC DEFIBRILLATOR PLACEMENT  10/30/2014  . CATARACT EXTRACTION W/ INTRAOCULAR LENS  IMPLANT, BILATERAL Bilateral ~ 1991  . CORONARY ANGIOPLASTY    . CORONARY ARTERY BYPASS GRAFT  2005   LIMA to LAD, SVG diagonal, SVG to PDA and PLA  . CYSTOSCOPY W/ LITHOLAPAXY / EHL    . EP IMPLANTABLE DEVICE N/A 10/30/2014   Procedure: ICD Implant;  Surgeon: Deboraha Sprang, MD, SJM Ellipse DR implanted for primary prevention  . LEFT AND RIGHT HEART CATHETERIZATION WITH CORONARY/GRAFT ANGIOGRAM N/A 11/29/2013   Procedure: LEFT AND RIGHT HEART CATHETERIZATION WITH Beatrix Fetters;  Surgeon: Blane Ohara, MD;  Location: [facility] CATH LAB;  Service: Cardiovascular;  Laterality: N/A;  . RIGHT HEART CATHETERIZATION N/A 12/03/2013   Procedure: RIGHT HEART CATH;  Surgeon: Jolaine Artist, MD;  Location: [facility] CATH LAB;  Service: Cardiovascular;  Laterality: N/A;  . TONSILLECTOMY      Current Outpatient Medications  Medication Sig Dispense Refill  . allopurinol (ZYLOPRIM) 100 MG tablet Take 1 tablet by mouth daily.    [name] ALPRAZolam (XANAX) 0.25 MG tablet Take 0.5 tablets by mouth daily.  0  .  aspirin 81 MG tablet Take 81 mg by mouth daily as needed.     [name] atorvastatin (LIPITOR) 80 MG tablet take 1 tablet by mouth once daily 90 tablet 3  . carvedilol (COREG) 12.5 MG tablet TAKE ONE TABLET BY MOUTH TWICE DAILY. TAKE WITH A MEAL. 180 tablet 2  . furosemide (LASIX) 20 MG tablet Take 20 mg by mouth 2 (two) times daily.    [name] glipiZIDE (GLUCOTROL XL) 5 MG 24 hr tablet Take 1 tablet by mouth 2 (two) times daily.  1  . hydrALAZINE (APRESOLINE) 50 MG tablet TAKE 1 AND 1/2 TABLETS BY MOUTH THREE TIMES DAILY (Patient taking differently: Take 50 mg by mouth every morning. & 100 mg in the evening) 135 tablet 6  .  hydrochlorothiazide (HYDRODIURIL) 50 MG tablet Take 50 mg by mouth every morning. & 100 mg in the evening    . isosorbide mononitrate (IMDUR) 60 MG 24 hr tablet TAKE ONE TABLET BY MOUTH DAILY 90 tablet 3  . Omega-3 Fatty Acids (FISH OIL) 1000 MG CAPS Take 1 capsule by mouth 2 (two) times daily.    . repaglinide (PRANDIN) 1 MG tablet Take 1 mg by mouth daily.    [name] rOPINIRole (REQUIP) 0.25 MG tablet Take 0.5 mg by mouth at bedtime.    [name] SYNTHROID 75 MCG tablet Take 1 tablet by mouth daily.    . ONE TOUCH ULTRA TEST test strip 1 each by Other route 2 (two) times daily.   0   No current facility-administered medications for this visit.   Allergies:  Tetracycline   ROS:   Hearing loss.  Physical Exam: VS:  BP 124/66   Pulse 64   Ht 5\' 8"  (1.727 m)   Wt 164 lb 9.6 oz (74.7 kg)   SpO2 99%   BMI 25.03 kg/m , BMI Body mass index is 25.03 kg/m.  Wt Readings from Last 3 Encounters:  07/09/19 164 lb 9.6 oz (74.7 kg)  05/25/19 161 lb 12.8 oz (73.4 kg)  04/10/19 160 lb (72.6 kg)    General:  Elderly male, appears comfortable at rest. HEENT: Conjunctiva and lids normal, wearing a mask. Neck: Supple, no elevated JVP or carotid bruits, no thyromegaly. Lungs: Clear to auscultation, nonlabored breathing at rest. Cardiac: Regular rate and rhythm, no S3, 2/6 systolic murmur, no pericardial rub. Abdomen: Soft, nontender, bowel sounds present. Extremities: Trace ankle edema, distal pulses 2+.  ECG:  An ECG dated 11/13/2018 was personally reviewed today and demonstrated:  Sinus rhythm with ventricular pacing.  Recent Labwork:  No interval lab work for review today.  Other Studies Reviewed Today:  Echocardiogram 11/29/2018: 1. Stage 1: N/A: Basal and mid inferior wall is abnormal.  2. The left ventricle has severely reduced systolic function, with an  ejection fraction of 25-30%. The cavity size was mildly dilated. There is  severe concentric left ventricular hypertrophy. Left ventricular  diastolic  Doppler parameters are consistent  with impaired relaxation. Left ventricular diffuse hypokinesis.  3. The right ventricle has moderately reduced systolic function. The  cavity was normal. There is mildly increased right ventricular wall  thickness.  4. The aortic valve is tricuspid. Moderate thickening of the aortic  valve. Mild calcification of the aortic valve. Aortic valve regurgitation  is mild by color flow Doppler. Mild-moderate stenosis of the aortic valve.  Moderate aortic annular  calcification noted.  5. The mitral valve is degenerative. Moderate thickening of the mitral  valve leaflet. There is mild to moderate mitral annular calcification  present.  6. The tricuspid valve is grossly normal.  7. The aorta is normal unless otherwise noted.  8. The interatrial septum was not well visualized.   Assessment and Plan:  1.  Chronic systolic heart failure, weight has been stable and he reports no substantial change in diuretic regimen.  Plan to continue Coreg, hydralazine, and Imdur.  Recent thoracic impedance was normal.  2.  Ischemic cardiomyopathy with LVEF 25 to 30%.  St. Jude ICD in place.  No device shocks or syncope.  3.  Multivessel CAD status post CABG with medically managed graft disease.  No progressive angina symptoms.  Continue aspirin, beta-blocker, Imdur, and statin.  4.  CKD stage IV, he is following with nephrology.  Medication Adjustments/Labs and Tests Ordered: Current medicines are reviewed at length with the patient today.  Concerns regarding medicines are outlined above.   Tests Ordered: No orders of the defined types were placed in this encounter.   Medication Changes: No orders of the defined types were placed in this encounter.   Disposition:  Follow up 4 months in the [place] office.  Signed, Satira Sark, MD, [facility] 07/09/2019 2:32 PM    [facility] at [place], [place], [place]  [address] Phone: [phone]; Fax: [phone]

## 2019-07-09 NOTE — Patient Instructions (Addendum)
Medication Instructions:   Your physician recommends that you continue on your current medications as directed. Please refer to the Current Medication list given to you today.  Labwork:  NONE  Testing/Procedures:  NONE  Follow-Up:  Your physician recommends that you schedule a follow-up appointment in: 4 months (office).  Any Other Special Instructions Will Be Listed Below (If Applicable).  If you need a refill on your cardiac medications before your next appointment, please call your pharmacy. 

## 2019-07-16 DIAGNOSIS — H353231 Exudative age-related macular degeneration, bilateral, with active choroidal neovascularization: Secondary | ICD-10-CM | POA: Diagnosis not present

## 2019-07-20 DIAGNOSIS — I5042 Chronic combined systolic (congestive) and diastolic (congestive) heart failure: Secondary | ICD-10-CM | POA: Diagnosis not present

## 2019-07-20 DIAGNOSIS — E1122 Type 2 diabetes mellitus with diabetic chronic kidney disease: Secondary | ICD-10-CM | POA: Diagnosis not present

## 2019-07-20 DIAGNOSIS — R809 Proteinuria, unspecified: Secondary | ICD-10-CM | POA: Diagnosis not present

## 2019-07-20 DIAGNOSIS — N189 Chronic kidney disease, unspecified: Secondary | ICD-10-CM | POA: Diagnosis not present

## 2019-07-20 DIAGNOSIS — E1129 Type 2 diabetes mellitus with other diabetic kidney complication: Secondary | ICD-10-CM | POA: Diagnosis not present

## 2019-07-20 DIAGNOSIS — I129 Hypertensive chronic kidney disease with stage 1 through stage 4 chronic kidney disease, or unspecified chronic kidney disease: Secondary | ICD-10-CM | POA: Diagnosis not present

## 2019-07-20 DIAGNOSIS — D631 Anemia in chronic kidney disease: Secondary | ICD-10-CM | POA: Diagnosis not present

## 2019-07-20 DIAGNOSIS — E211 Secondary hyperparathyroidism, not elsewhere classified: Secondary | ICD-10-CM | POA: Diagnosis not present

## 2019-07-31 ENCOUNTER — Ambulatory Visit (INDEPENDENT_AMBULATORY_CARE_PROVIDER_SITE_OTHER): Payer: Medicare Other | Admitting: *Deleted

## 2019-07-31 DIAGNOSIS — R001 Bradycardia, unspecified: Secondary | ICD-10-CM

## 2019-08-01 ENCOUNTER — Ambulatory Visit (INDEPENDENT_AMBULATORY_CARE_PROVIDER_SITE_OTHER): Payer: Medicare Other

## 2019-08-01 DIAGNOSIS — I5022 Chronic systolic (congestive) heart failure: Secondary | ICD-10-CM | POA: Diagnosis not present

## 2019-08-01 DIAGNOSIS — Z95810 Presence of automatic (implantable) cardiac defibrillator: Secondary | ICD-10-CM

## 2019-08-01 LAB — CUP PACEART REMOTE DEVICE CHECK
Battery Remaining Longevity: 41 mo
Battery Remaining Percentage: 44 %
Battery Voltage: 2.89 V
Brady Statistic AP VP Percent: 6 %
Brady Statistic AP VS Percent: 9.1 %
Brady Statistic AS VP Percent: 1 %
Brady Statistic AS VS Percent: 83 %
Brady Statistic RA Percent Paced: 12 %
Brady Statistic RV Percent Paced: 6.2 %
Date Time Interrogation Session: 20210512015024
HighPow Impedance: 63 Ohm
HighPow Impedance: 63 Ohm
Implantable Lead Implant Date: 20160810
Implantable Lead Implant Date: 20160810
Implantable Lead Location: 753859
Implantable Lead Location: 753860
Implantable Pulse Generator Implant Date: 20160810
Lead Channel Impedance Value: 280 Ohm
Lead Channel Impedance Value: 390 Ohm
Lead Channel Pacing Threshold Amplitude: 1.25 V
Lead Channel Pacing Threshold Amplitude: 1.25 V
Lead Channel Pacing Threshold Pulse Width: 0.5 ms
Lead Channel Pacing Threshold Pulse Width: 0.5 ms
Lead Channel Sensing Intrinsic Amplitude: 1.8 mV
Lead Channel Sensing Intrinsic Amplitude: 5.5 mV
Lead Channel Setting Pacing Amplitude: 2.25 V
Lead Channel Setting Pacing Amplitude: 2.5 V
Lead Channel Setting Pacing Pulse Width: 0.5 ms
Lead Channel Setting Sensing Sensitivity: 0.5 mV
Pulse Gen Serial Number: 7179751

## 2019-08-02 NOTE — Progress Notes (Signed)
Remote ICD transmission.   

## 2019-08-03 NOTE — Progress Notes (Signed)
EPIC Encounter for ICM Monitoring  Patient Name: Clayton Morton is a 86 y.o. male Date: 08/03/2019 Primary Care Physican: Glenda Chroman, MD Primary Cardiologist: Domenic Polite Electrophysiologist: Allred 07/09/2019 OfficeWeight:164lbs  AT/AF Burden: <1%   Transmission reviewed.  CorVueThoracic impedancenormal.  Prescribed:  Furosemide20 mgtake 1 tablet  (20 MG total) daily - May take 40 mg alternating with 20 mg  Hydrochlorothiazide 50 mg Take 50 mg by mouth every morning. & 100 mg in the evening  Labs: 09/27/2018 Creatinine 3.25, BUN 64, Potassium 5.4, Sodium 144, GFR 17-19  Recommendations:None  Follow-up plan: ICM clinic phone appointment on6/15/2021. 91 day device clinic remote transmission 10/30/2019.Office appt 11/12/2019 with Dr Domenic Polite.  Copy of ICM check sent to Dr.Allred.   3 month ICM trend: 08/01/2019    1 Year ICM trend:       Rosalene Billings, RN 08/03/2019 11:55 AM

## 2019-08-19 DIAGNOSIS — I10 Essential (primary) hypertension: Secondary | ICD-10-CM | POA: Diagnosis not present

## 2019-08-19 DIAGNOSIS — E119 Type 2 diabetes mellitus without complications: Secondary | ICD-10-CM | POA: Diagnosis not present

## 2019-08-19 DIAGNOSIS — I2510 Atherosclerotic heart disease of native coronary artery without angina pectoris: Secondary | ICD-10-CM | POA: Diagnosis not present

## 2019-08-19 DIAGNOSIS — E7800 Pure hypercholesterolemia, unspecified: Secondary | ICD-10-CM | POA: Diagnosis not present

## 2019-08-28 DIAGNOSIS — E1159 Type 2 diabetes mellitus with other circulatory complications: Secondary | ICD-10-CM | POA: Diagnosis not present

## 2019-08-28 DIAGNOSIS — E1140 Type 2 diabetes mellitus with diabetic neuropathy, unspecified: Secondary | ICD-10-CM | POA: Diagnosis not present

## 2019-09-04 ENCOUNTER — Ambulatory Visit (INDEPENDENT_AMBULATORY_CARE_PROVIDER_SITE_OTHER): Payer: Medicare Other

## 2019-09-04 DIAGNOSIS — Z95810 Presence of automatic (implantable) cardiac defibrillator: Secondary | ICD-10-CM

## 2019-09-04 DIAGNOSIS — I5022 Chronic systolic (congestive) heart failure: Secondary | ICD-10-CM | POA: Diagnosis not present

## 2019-09-04 NOTE — Progress Notes (Signed)
EPIC Encounter for ICM Monitoring  Patient Name: Clayton Morton is a 86 y.o. male Date: 09/04/2019 Primary Care Physican: Glenda Chroman, MD Primary Cardiologist: Domenic Polite Electrophysiologist: Allred 07/09/2019 OfficeWeight:164lbs  AT/AF Burden: <1%   Attempted call to patient and unable to reach.  Left detailed message per DPR regarding transmission. Transmission reviewed.   CorVueThoracic impedancenormal but suggesting possible fluid accumulation starting 08/22/2019 until date of transmission.  Prescribed:  Furosemide20 mgtake 1 tablet  (20 MGtotal) two times daily  Hydrochlorothiazide 50 mg Take 50 mg by mouth every morning. & 100 mg in the evening  Labs: 12/15/2018 Creatinine 3.05, BUN 75, Potassium 4.0, Sodium 141. GFR 20-24 09/27/2018 Creatinine 3.25, BUN 64, Potassium 5.4, Sodium 144, GFR 17-19  Recommendations:Left voice mail with ICM number and encouraged to call if experiencing any fluid symptoms.  Follow-up plan: ICM clinic phone appointment on7/19/2021. 91 day device clinic remote transmission8/12/2019.Office appt 11/12/2019 with Dr Domenic Polite.  Copy of ICM check sent to Dr.Allred.  3 month ICM trend: 09/04/2019    1 Year ICM trend:       Rosalene Billings, RN 09/04/2019 1:29 PM

## 2019-09-19 DIAGNOSIS — I10 Essential (primary) hypertension: Secondary | ICD-10-CM | POA: Diagnosis not present

## 2019-09-19 DIAGNOSIS — I2510 Atherosclerotic heart disease of native coronary artery without angina pectoris: Secondary | ICD-10-CM | POA: Diagnosis not present

## 2019-09-19 DIAGNOSIS — E119 Type 2 diabetes mellitus without complications: Secondary | ICD-10-CM | POA: Diagnosis not present

## 2019-09-19 DIAGNOSIS — E7800 Pure hypercholesterolemia, unspecified: Secondary | ICD-10-CM | POA: Diagnosis not present

## 2019-10-04 ENCOUNTER — Telehealth: Payer: Self-pay | Admitting: Cardiology

## 2019-10-04 NOTE — Telephone Encounter (Signed)
Pt would like for [name] to give him a call-- would not state what was going on, just asked for [name] to please call him  [phone]

## 2019-10-04 NOTE — Telephone Encounter (Signed)
Pt recently seen Dr Theador Hawthorne (notes in Care everywhere) and wanted Dr Domenic Polite to review the notes and discuss over virtual appt before he made any further decisions - no virtual appt available until mid August - pt aware that would forward to Dr Domenic Polite and that he is out of office until next week

## 2019-10-07 NOTE — Telephone Encounter (Signed)
Thank you.  I reviewed the note from [name].  It looks like they basically discussed the fact that Clayton Morton' renal function is getting closer to the point that he may need to consider dialysis (CKD stage 5), if this is in fact something he even wants to pursue.  [name] talked with him about possibly getting a vascular access placed in case he did choose to pursue hemodialysis eventually.  I am happy to talk with Clayton Morton in a virtual encounter if necessary, but the big question is going to be whether he really wants to even think about dialysis or not.  It did not look like anything specific was set up at the visit on July 7 however.

## 2019-10-08 ENCOUNTER — Ambulatory Visit (INDEPENDENT_AMBULATORY_CARE_PROVIDER_SITE_OTHER): Payer: Medicare Other

## 2019-10-08 DIAGNOSIS — I5022 Chronic systolic (congestive) heart failure: Secondary | ICD-10-CM | POA: Diagnosis not present

## 2019-10-08 DIAGNOSIS — Z95810 Presence of automatic (implantable) cardiac defibrillator: Secondary | ICD-10-CM

## 2019-10-08 NOTE — Telephone Encounter (Signed)
Pt voiced understanding - already has office appt with Dr Domenic Polite on 8/23 and next appt with Dr Theador Hawthorne is September - pt will keep scheduled appt and discuss questions with Dr Domenic Polite at that time

## 2019-10-12 NOTE — Progress Notes (Addendum)
EPIC Encounter for ICM Monitoring  Patient Name: Clayton Morton is a 86 y.o. male Date: 10/12/2019 Primary Care Physican: Glenda Chroman, MD Primary Cardiologist: Domenic Polite Electrophysiologist: Allred Nephrologist: Theador Hawthorne  09/26/2019 OfficeWeight:161lbs  AT/AF Burden: <1%  Transmission reviewed.   CorVueThoracic impedancenormal.  Prescribed:  Furosemide20 mgtake1tablet (20 MGtotal) two times daily  Hydrochlorothiazide 50 mgTake 50 mg by mouth every morning. & 100 mg in the evening  Labs: 12/15/2018 Creatinine 3.05, BUN 75, Potassium 4.0, Sodium 141. GFR 20-24 09/27/2018 Creatinine 3.25, BUN 64, Potassium 5.4, Sodium 144, GFR 17-19  Recommendations:No changes  Follow-up plan: ICM clinic phone appointment on8/23/2021. 91 day device clinic remote transmission8/12/2019.  EP/Cardiology Office Visits: 11/12/2019 with [name].  Recall for 05/23/2020 with Dr Rayann Heman.   Copy of ICM check sent to [name].   3 month ICM trend: 10/12/2019    1 Year ICM trend:       Rosalene Billings, RN 10/12/2019 1:11 PM

## 2019-10-19 DIAGNOSIS — I2510 Atherosclerotic heart disease of native coronary artery without angina pectoris: Secondary | ICD-10-CM | POA: Diagnosis not present

## 2019-10-19 DIAGNOSIS — I10 Essential (primary) hypertension: Secondary | ICD-10-CM | POA: Diagnosis not present

## 2019-10-19 DIAGNOSIS — E7800 Pure hypercholesterolemia, unspecified: Secondary | ICD-10-CM | POA: Diagnosis not present

## 2019-10-19 DIAGNOSIS — E119 Type 2 diabetes mellitus without complications: Secondary | ICD-10-CM | POA: Diagnosis not present

## 2019-10-25 DIAGNOSIS — R000 Tachycardia, unspecified: Secondary | ICD-10-CM | POA: Diagnosis not present

## 2019-10-25 DIAGNOSIS — R4020 Unspecified coma: Secondary | ICD-10-CM | POA: Diagnosis not present

## 2019-10-25 DIAGNOSIS — R0689 Other abnormalities of breathing: Secondary | ICD-10-CM | POA: Diagnosis not present

## 2019-10-25 DIAGNOSIS — R55 Syncope and collapse: Secondary | ICD-10-CM | POA: Diagnosis not present

## 2019-10-30 ENCOUNTER — Ambulatory Visit (INDEPENDENT_AMBULATORY_CARE_PROVIDER_SITE_OTHER): Payer: Medicare Other | Admitting: *Deleted

## 2019-10-30 DIAGNOSIS — I255 Ischemic cardiomyopathy: Secondary | ICD-10-CM

## 2019-10-30 LAB — CUP PACEART REMOTE DEVICE CHECK
Battery Remaining Longevity: 39 mo
Battery Remaining Percentage: 42 %
Battery Voltage: 2.89 V
Brady Statistic AP VP Percent: 6.6 %
Brady Statistic AP VS Percent: 9.6 %
Brady Statistic AS VP Percent: 1 %
Brady Statistic AS VS Percent: 82 %
Brady Statistic RA Percent Paced: 13 %
Brady Statistic RV Percent Paced: 6.8 %
Date Time Interrogation Session: 20210810022024
HighPow Impedance: 66 Ohm
HighPow Impedance: 66 Ohm
Implantable Lead Implant Date: 20160810
Implantable Lead Implant Date: 20160810
Implantable Lead Location: 753859
Implantable Lead Location: 753860
Implantable Pulse Generator Implant Date: 20160810
Lead Channel Impedance Value: 300 Ohm
Lead Channel Impedance Value: 410 Ohm
Lead Channel Pacing Threshold Amplitude: 1.125 V
Lead Channel Pacing Threshold Amplitude: 1.25 V
Lead Channel Pacing Threshold Pulse Width: 0.5 ms
Lead Channel Pacing Threshold Pulse Width: 0.5 ms
Lead Channel Sensing Intrinsic Amplitude: 1.5 mV
Lead Channel Sensing Intrinsic Amplitude: 8.2 mV
Lead Channel Setting Pacing Amplitude: 2.125
Lead Channel Setting Pacing Amplitude: 2.5 V
Lead Channel Setting Pacing Pulse Width: 0.5 ms
Lead Channel Setting Sensing Sensitivity: 0.5 mV
Pulse Gen Serial Number: 7179751

## 2019-10-31 ENCOUNTER — Other Ambulatory Visit: Payer: Self-pay | Admitting: Cardiology

## 2019-10-31 DIAGNOSIS — I509 Heart failure, unspecified: Secondary | ICD-10-CM

## 2019-11-01 NOTE — Progress Notes (Signed)
Remote ICD transmission.   

## 2019-11-05 DIAGNOSIS — H353231 Exudative age-related macular degeneration, bilateral, with active choroidal neovascularization: Secondary | ICD-10-CM | POA: Diagnosis not present

## 2019-11-12 ENCOUNTER — Encounter: Payer: Self-pay | Admitting: Cardiology

## 2019-11-12 ENCOUNTER — Ambulatory Visit (INDEPENDENT_AMBULATORY_CARE_PROVIDER_SITE_OTHER): Payer: Medicare Other

## 2019-11-12 ENCOUNTER — Ambulatory Visit (INDEPENDENT_AMBULATORY_CARE_PROVIDER_SITE_OTHER): Payer: Medicare Other | Admitting: Cardiology

## 2019-11-12 VITALS — BP 136/68 | HR 60 | Ht 68.0 in | Wt 162.4 lb

## 2019-11-12 DIAGNOSIS — I5022 Chronic systolic (congestive) heart failure: Secondary | ICD-10-CM | POA: Diagnosis not present

## 2019-11-12 DIAGNOSIS — Z95810 Presence of automatic (implantable) cardiac defibrillator: Secondary | ICD-10-CM

## 2019-11-12 DIAGNOSIS — N184 Chronic kidney disease, stage 4 (severe): Secondary | ICD-10-CM | POA: Diagnosis not present

## 2019-11-12 DIAGNOSIS — I255 Ischemic cardiomyopathy: Secondary | ICD-10-CM

## 2019-11-12 DIAGNOSIS — I25119 Atherosclerotic heart disease of native coronary artery with unspecified angina pectoris: Secondary | ICD-10-CM | POA: Diagnosis not present

## 2019-11-12 MED ORDER — HYDRALAZINE HCL 50 MG PO TABS: 50.0000 mg | ORAL_TABLET | Freq: Three times a day (TID) | ORAL | 6 refills | Status: DC

## 2019-11-12 NOTE — Progress Notes (Signed)
Cardiology Office Note  Date: 11/12/2019   ID: Clayton JUNCAJ, DOB 09-18-1932, MRN [id number]  PCP:  Clayton Chroman, MD  Cardiologist:  Clayton Lesches, MD Electrophysiologist:  Clayton Grayer, MD   Chief Complaint  Patient presents with  . Cardiac follow-up    History of Present Illness: Clayton Morton is an 86 y.o. male last seen in April.  He is here today for follow-up with an Environmental consultant.  He states that overall he has done well, although has had a few episodes typically around the middle of the day where he is weak and presyncopal.  Earlier in August EMS was summoned with a particular event, reportedly his blood pressure was low although did rebound.  ECG shows sinus rhythm with right bundle branch block and left anterior fascicular block.  He has had no device shocks and interrogations have not shown any sustained arrhythmias.  He follows with Dr. Rayann Morton, St. Jude ICD in place.  Recent device check in August was normal.  Asik impedance check today suggested some fluid accumulation in the last few days but trending back to baseline.  He remains on stable diuretic regimen and his weight is relatively stable today as well, actually down a few pounds.  He continues to follow with Dr. Theador Morton, was seen in July.  1.0 with GFR 13-15 range.  We went over his medications.  It sounds like he may have been taking 2 of his hydralazine tablets in the morning and then another in the evening.  We discussed changing hydralazine to 50 mg 3 times a day.  Past Medical History:  Diagnosis Date  . Age-related macular degeneration, wet, right eye ([place])   . AICD (automatic cardioverter/defibrillator) present 10/30/2014  . Cerebrovascular accident [facility])    Right subcortical infarct 2006  . Chronic systolic congestive heart failure ([place])    a) ECHO (02/2012): EF 30-35%, no obvious thrombus b) ECHO (11/2013): EF 10%, AV mildly calcified, mild MR, RV mod dialted and sys fx sev reduced, TAPSE 0.8, lateral  annulus peak S velocity 5.4 c) RHC (11/29/13): RA 15, RV: 65/19, PA 49%, CO/CI: 3.25 / 1.7 d) RHC (12/03/13): RA 1, RV 25/1/2, PA 27/10 (16), PCWP 6, Fick CO/CI: 5.3 / 2.9, PVR 1.9 WU, PA sat 72 and 74%   . Coronary atherosclerosis of native coronary artery    1) CABG 2005 2) LHC (910/15): patent SVG to PDA, occlusion of SVG sequence to PLA, patency of LIMA to LAD and patency of SVG to diagonal with sev stenosis at coronary anastomotic site  . Diabetes mellitus, type 2 ([place])   . Essential hypertension   . History of gout   . Hyperlipidemia   . Ischemic cardiomyopathy   . Kidney stones   . Macular degeneration    Dr. [name]  . Non-ST elevation MI (NSTEMI) ([place])    2005  . Varicose veins     Past Surgical History:  Procedure Laterality Date  . CARDIAC CATHETERIZATION    . CARDIAC DEFIBRILLATOR PLACEMENT  10/30/2014  . CATARACT EXTRACTION W/ INTRAOCULAR LENS  IMPLANT, BILATERAL Bilateral ~ 1991  . CORONARY ANGIOPLASTY    . CORONARY ARTERY BYPASS GRAFT  2005   LIMA to LAD, SVG diagonal, SVG to PDA and PLA  . CYSTOSCOPY W/ LITHOLAPAXY / EHL    . EP IMPLANTABLE DEVICE N/A 10/30/2014   Procedure: ICD Implant;  Surgeon: Clayton Sprang, MD, SJM Ellipse DR implanted for primary prevention  . LEFT AND RIGHT HEART CATHETERIZATION  WITH CORONARY/GRAFT ANGIOGRAM N/A 11/29/2013   Procedure: LEFT AND RIGHT HEART CATHETERIZATION WITH Clayton Morton;  Surgeon: Clayton Ohara, MD;  Location: [facility] CATH LAB;  Service: Cardiovascular;  Laterality: N/A;  . RIGHT HEART CATHETERIZATION N/A 12/03/2013   Procedure: RIGHT HEART CATH;  Surgeon: Clayton Artist, MD;  Location: [facility] CATH LAB;  Service: Cardiovascular;  Laterality: N/A;  . TONSILLECTOMY      Current Outpatient Medications  Medication Sig Dispense Refill  . allopurinol (ZYLOPRIM) 100 MG tablet Take 1 tablet by mouth daily.    Clayton Morton ALPRAZolam (XANAX) 0.25 MG tablet Take 0.5 tablets by mouth daily.  0  . aspirin 81 MG tablet Take 81 mg by mouth  daily as needed.     Clayton Morton atorvastatin (LIPITOR) 80 MG tablet take 1 tablet by mouth once daily 90 tablet 3  . carvedilol (COREG) 12.5 MG tablet TAKE ONE TABLET BY MOUTH TWICE DAILY. TAKE WITH A MEAL. 180 tablet 2  . furosemide (LASIX) 20 MG tablet Take 20 mg by mouth 2 (two) times daily.    Clayton Morton glipiZIDE (GLUCOTROL XL) 5 MG 24 hr tablet Take 1 tablet by mouth 2 (two) times daily.  1  . hydrALAZINE (APRESOLINE) 50 MG tablet Take 1 tablet (50 mg total) by mouth 3 (three) times daily. 90 tablet 6  . hydrochlorothiazide (HYDRODIURIL) 50 MG tablet Take 50 mg by mouth every morning. & 100 mg in the evening    . isosorbide mononitrate (IMDUR) 60 MG 24 hr tablet TAKE ONE TABLET BY MOUTH DAILY 90 tablet 3  . Omega-3 Fatty Acids (FISH OIL) 1000 MG CAPS Take 1 capsule by mouth 2 (two) times daily.    . ONE TOUCH ULTRA TEST test strip 1 each by Other route 2 (two) times daily.   0  . repaglinide (PRANDIN) 1 MG tablet Take 1 mg by mouth daily.    Clayton Morton rOPINIRole (REQUIP) 0.25 MG tablet Take 0.5 mg by mouth at bedtime.    Clayton Morton SYNTHROID 75 MCG tablet Take 1 tablet by mouth daily.     No current facility-administered medications for this visit.   Allergies:  Tetracycline   ROS:   Hearing loss.  Physical Exam: VS:  BP 136/68   Pulse 60   Ht 5\' 8"  (1.727 m)   Wt 162 lb 6.4 oz (73.7 kg) Comment: pt state weight from home scale this morning  SpO2 99%   BMI 24.69 kg/m , BMI Body mass index is 24.69 kg/m.  Wt Readings from Last 3 Encounters:  11/12/19 162 lb 6.4 oz (73.7 kg)  07/09/19 164 lb 9.6 oz (74.7 kg)  05/25/19 161 lb 12.8 oz (73.4 kg)    General: Elderly male, appears comfortable at rest. HEENT: Conjunctiva and lids normal, wearing a mask. Neck: Supple, no elevated JVP or carotid bruits, no thyromegaly. Lungs: Clear to auscultation, nonlabored breathing at rest. Cardiac: Regular rate and rhythm, no S3, 2/6 systolic murmur, no pericardial rub. Extremities: Trace ankle edema, distal pulses 2+.  ECG:   An ECG dated 11/13/2018 was personally reviewed today and demonstrated:  Sinus rhythm with ventricular pacing.  Recent Labwork:  June 2021: Creatinine 4.0  Other Studies Reviewed Today:  Echocardiogram 11/29/2018: 1. Stage 1: N/A: Basal and mid inferior wall is abnormal.  2. The left ventricle has severely reduced systolic function, with an  ejection fraction of 25-30%. The cavity size was mildly dilated. There is  severe concentric left ventricular hypertrophy. Left ventricular diastolic  Doppler parameters are consistent  with impaired relaxation. Left ventricular diffuse hypokinesis.  3. The right ventricle has moderately reduced systolic function. The  cavity was normal. There is mildly increased right ventricular wall  thickness.  4. The aortic valve is tricuspid. Moderate thickening of the aortic  valve. Mild calcification of the aortic valve. Aortic valve regurgitation  is mild by color flow Doppler. Mild-moderate stenosis of the aortic valve.  Moderate aortic annular  calcification noted.  5. The mitral valve is degenerative. Moderate thickening of the mitral  valve leaflet. There is mild to moderate mitral annular calcification  present.  6. The tricuspid valve is grossly normal.  7. The aorta is normal unless otherwise noted.  8. The interatrial septum was not well visualized.   Assessment and Plan:  1.  Ischemic cardiomyopathy with LVEF 25 to 30%.  Weight is down a few pounds, recent thoracic impedance noted, plan to continue present diuretic regimen.  He is also on Coreg, Imdur, and hydralazine which will be changed to 50 mg 3 times a day.  2.  Multivessel CAD status post CABG with medically managed graft disease.  He does not describe any active angina at this time.  Continue aspirin and statin in addition to above medications.  3.  CKD stage IV-V, he continues to follow with Dr. Theador Morton.  Medication Adjustments/Labs and Tests Ordered: Current medicines are  reviewed at length with the patient today.  Concerns regarding medicines are outlined above.   Tests Ordered: No orders of the defined types were placed in this encounter.   Medication Changes: Meds ordered this encounter  Medications  . hydrALAZINE (APRESOLINE) 50 MG tablet    Sig: Take 1 tablet (50 mg total) by mouth 3 (three) times daily.    Dispense:  90 tablet    Refill:  6    11/12/2019 dose decrease    Disposition:  Follow up 4 months in the [place] office.  Signed, Satira Sark, MD, [facility] 11/12/2019 2:05 PM    [facility] at [place], [place], [place] [address] Phone: [phone]; Fax: [phone]

## 2019-11-12 NOTE — Patient Instructions (Addendum)
Medication Instructions:   Your physician has recommended you make the following change in your medication:  Decrease hydralazine 50 mg to one by mouth three times daily (morning, noon and evening)  Continue other medications the same  Labwork:   NONE  Testing/Procedures:   NONE  Follow-Up:  Your physician recommends that you schedule a follow-up appointment in: 4 months.  Any Other Special Instructions Will Be Listed Below (If Applicable).  If you need a refill on your cardiac medications before your next appointment, please call your pharmacy.

## 2019-11-12 NOTE — Progress Notes (Signed)
EPIC Encounter for ICM Monitoring  Patient Name: Clayton Morton is a 86 y.o. male Date: 11/12/2019 Primary Care Physican: Glenda Chroman, MD Primary Cardiologist: Domenic Polite Electrophysiologist: Allred Nephrologist: Theador Hawthorne  09/26/2019 OfficeWeight:161lbs  AT/AF Burden: <1%  Transmission reviewed.  CorVueThoracic impedancesuggesting possible fluid accumulation since 11/06/2019 and trending back to baseline.  Prescribed:  Furosemide20 mgtake1tablet (20 MGtotal)two times daily  Hydrochlorothiazide 50 mgTake 50 mg by mouth every morning & 100 mg in the evening  Labs: 12/15/2018 Creatinine 3.05, BUN 75, Potassium 4.0, Sodium 141. GFR 20-24 09/27/2018 Creatinine 3.25, BUN 64, Potassium 5.4, Sodium 144, GFR 17-19  Recommendations: Patient has office appointment 11/12/2019 with Dr Domenic Polite.  Follow-up plan: ICM clinic phone appointment on8/31/2021 to recheck fluid levels. 91 day device clinic remote transmission11/11/2019.  EP/Cardiology Office Visits: 11/12/2019 with [name].  Recall for 05/23/2020 with Dr Rayann Heman.   Copy of ICM check sent to [name] and Dr Domenic Polite.   3 month ICM trend: 11/12/2019    1 Year ICM trend:       Rosalene Billings, RN 11/12/2019 11:09 AM

## 2019-11-20 ENCOUNTER — Ambulatory Visit (INDEPENDENT_AMBULATORY_CARE_PROVIDER_SITE_OTHER): Payer: Medicare Other

## 2019-11-20 DIAGNOSIS — Z95810 Presence of automatic (implantable) cardiac defibrillator: Secondary | ICD-10-CM

## 2019-11-20 DIAGNOSIS — I5022 Chronic systolic (congestive) heart failure: Secondary | ICD-10-CM

## 2019-11-21 NOTE — Progress Notes (Signed)
EPIC Encounter for ICM Monitoring  Patient Name: Clayton Morton is a 86 y.o. male Date: 11/21/2019 Primary Care Physican: Glenda Chroman, MD Primary Care Physican: Glenda Chroman, MD Primary Cardiologist: Domenic Polite Electrophysiologist: Allred Nephrologist: Theador Hawthorne 09/26/2019 OfficeWeight:161lbs  AT/AF Burden: <1%  Transmission reviewed.  CorVueThoracic impedancereturned close to baseline.  Prescribed:  Furosemide20 mgtake1tablet (20 MGtotal)two times daily  Hydrochlorothiazide 50 mgTake 50 mg by mouth every morning & 100 mg in the evening  Labs: 12/15/2018 Creatinine 3.05, BUN 75, Potassium 4.0, Sodium 141. GFR 20-24 09/27/2018 Creatinine 3.25, BUN 64, Potassium 5.4, Sodium 144, GFR 17-19  Recommendations: No changes  Follow-up plan: ICM clinic phone appointment on9/27/2021. 91 day device clinic remote transmission11/11/2019.  EP/Cardiology Office Visits:03/24/2020 with Dr.McDowell.Recall for 05/23/2020 with Dr Rayann Heman.  Copy of ICM check sent to Dr.Allred and Dr Domenic Polite.   3 month ICM trend: 11/20/2019    1 Year ICM trend:       Rosalene Billings, RN 11/21/2019 4:48 PM

## 2019-12-17 ENCOUNTER — Ambulatory Visit (INDEPENDENT_AMBULATORY_CARE_PROVIDER_SITE_OTHER): Payer: Medicare Other

## 2019-12-17 DIAGNOSIS — Z95810 Presence of automatic (implantable) cardiac defibrillator: Secondary | ICD-10-CM

## 2019-12-17 DIAGNOSIS — I5022 Chronic systolic (congestive) heart failure: Secondary | ICD-10-CM | POA: Diagnosis not present

## 2019-12-18 DIAGNOSIS — N189 Chronic kidney disease, unspecified: Secondary | ICD-10-CM | POA: Diagnosis not present

## 2019-12-18 DIAGNOSIS — N179 Acute kidney failure, unspecified: Secondary | ICD-10-CM | POA: Diagnosis not present

## 2019-12-18 DIAGNOSIS — F419 Anxiety disorder, unspecified: Secondary | ICD-10-CM | POA: Diagnosis not present

## 2019-12-18 DIAGNOSIS — Z299 Encounter for prophylactic measures, unspecified: Secondary | ICD-10-CM | POA: Diagnosis not present

## 2019-12-18 DIAGNOSIS — N184 Chronic kidney disease, stage 4 (severe): Secondary | ICD-10-CM | POA: Diagnosis not present

## 2019-12-18 NOTE — Progress Notes (Signed)
EPIC Encounter for ICM Monitoring  Patient Name: Clayton Morton is a 86 y.o. male Date: 12/18/2019 Primary Care Physican: Glenda Chroman, MD Primary Cardiologist: Domenic Polite Electrophysiologist: Allred Nephrologist: Theador Hawthorne 11/12/2019 OfficeWeight:162lbs  AT/AF Burden: <1%  Transmission reviewed.  CorVueThoracic impedance normal.  Prescribed:  Furosemide20 mgtake1tablet (20 MGtotal)two times daily  Hydrochlorothiazide 50 mgTake 50 mg by mouth every morning & 100 mg in the evening  Labs: 12/15/2018 Creatinine 3.05, BUN 75, Potassium 4.0, Sodium 141. GFR 20-24 09/27/2018 Creatinine 3.25, BUN 64, Potassium 5.4, Sodium 144, GFR 17-19  Recommendations: No changes  Follow-up plan: ICM clinic phone appointment on11/03/2019. 91 day device clinic remote transmission11/11/2019.  EP/Cardiology Office Visits:03/24/2020 with Dr.McDowell.Recall for 05/23/2020 with Dr Rayann Heman.  Copy of ICM check sent to Dr.Allred  3 month ICM trend: 12/17/2019    1 Year ICM trend:       Rosalene Billings, RN 12/18/2019 5:22 PM

## 2019-12-31 ENCOUNTER — Telehealth: Payer: Self-pay

## 2019-12-31 DIAGNOSIS — H353231 Exudative age-related macular degeneration, bilateral, with active choroidal neovascularization: Secondary | ICD-10-CM | POA: Diagnosis not present

## 2019-12-31 NOTE — Telephone Encounter (Signed)
Merlin alert received for 6 NSVT and 1 VT-1 (monitor) event. EMG shows long NSVT, 180-190's.  Coreg 12.5 mg BID, Lasix 20 mg BID, HCTZ 50 mg every morning and 100 in the evening.   Called patient to assess. No answer, LMOVM.

## 2020-01-02 NOTE — Telephone Encounter (Signed)
Patient returned call, transferred to device clinic

## 2020-01-02 NOTE — Telephone Encounter (Signed)
Spoke with patient.  He reports on 10/10 at about time of episode he was getting up and ready for church, he was in a hurry and may have pushed too much.  He denies any symptoms.  Pt reports he is currently feeling good and continues to monitor his BP without any recent changes in results.  Patient confirms compliance with medications as ordered.   Advised will continue to monitor for now, reviewed ED precautions for Chest pain/ SOB.

## 2020-01-14 NOTE — Telephone Encounter (Signed)
Merlin alert received for similar episode- Pt had 18 sec log episode of VT in monitor zone.   Spoke with pt, he was again rushing to get ready for church.  States that he did have some anxiety yesterday with getting himself and his wife ready on time.  At one point he felt lightheaded and had to stop to rest.  He denies any chest pain or SOB.   Pt medications include ASA81mg , Carevdilol 12.5mg .  Echo 11/30/2018 shows EF 25-30%.    Pt last MD visit with [name] was telemed visit on 05/25/19, not due for f/u until 05/2020.    Reiterated ED precautions with patient

## 2020-01-21 ENCOUNTER — Ambulatory Visit (INDEPENDENT_AMBULATORY_CARE_PROVIDER_SITE_OTHER): Payer: Medicare Other

## 2020-01-21 DIAGNOSIS — I5022 Chronic systolic (congestive) heart failure: Secondary | ICD-10-CM | POA: Diagnosis not present

## 2020-01-21 DIAGNOSIS — Z95810 Presence of automatic (implantable) cardiac defibrillator: Secondary | ICD-10-CM

## 2020-01-25 NOTE — Progress Notes (Signed)
EPIC Encounter for ICM Monitoring  Patient Name: Clayton Morton is a 86 y.o. male Date: 01/25/2020 Primary Care Physican: Glenda Chroman, MD Primary Cardiologist: Domenic Polite Electrophysiologist: Allred Nephrologist: Theador Hawthorne 11/12/2019 OfficeWeight:162lbs  AT/AF Burden: <1%  Transmission reviewed.  CorVueThoracic impedance normal.  Prescribed:  Furosemide20 mgtake1tablet (20 MGtotal)two times daily  Hydrochlorothiazide 50 mgTake 50 mg by mouth every morning & 100 mg in the evening  Labs: 12/15/2018 Creatinine 3.05, BUN 75, Potassium 4.0, Sodium 141. GFR 20-24 09/27/2018 Creatinine 3.25, BUN 64, Potassium 5.4, Sodium 144, GFR 17-19  Recommendations:No changes  Follow-up plan: ICM clinic phone appointment on12/09/2019. 91 day device clinic remote transmission11/11/2019.  EP/Cardiology Office Visits:1/3/2022with Dr.McDowell.05/23/2020 with Dr Rayann Heman.  Copy of ICM check sent to Dr.Allred  3 month ICM trend: 01/21/2020    1 Year ICM trend:       Rosalene Billings, RN 01/25/2020 4:35 PM

## 2020-01-29 ENCOUNTER — Telehealth: Payer: Self-pay | Admitting: Student

## 2020-01-29 ENCOUNTER — Ambulatory Visit (INDEPENDENT_AMBULATORY_CARE_PROVIDER_SITE_OTHER): Payer: Medicare Other

## 2020-01-29 DIAGNOSIS — R001 Bradycardia, unspecified: Secondary | ICD-10-CM | POA: Diagnosis not present

## 2020-01-29 LAB — CUP PACEART REMOTE DEVICE CHECK
Battery Remaining Longevity: 37 mo
Battery Remaining Percentage: 39 %
Battery Voltage: 2.87 V
Brady Statistic AP VP Percent: 7.1 %
Brady Statistic AP VS Percent: 9.6 %
Brady Statistic AS VP Percent: 1 %
Brady Statistic AS VS Percent: 81 %
Brady Statistic RA Percent Paced: 14 %
Brady Statistic RV Percent Paced: 7.2 %
Date Time Interrogation Session: 20211109020024
HighPow Impedance: 65 Ohm
HighPow Impedance: 65 Ohm
Implantable Lead Implant Date: 20160810
Implantable Lead Implant Date: 20160810
Implantable Lead Location: 753859
Implantable Lead Location: 753860
Implantable Pulse Generator Implant Date: 20160810
Lead Channel Impedance Value: 290 Ohm
Lead Channel Impedance Value: 410 Ohm
Lead Channel Pacing Threshold Amplitude: 1 V
Lead Channel Pacing Threshold Amplitude: 1.25 V
Lead Channel Pacing Threshold Pulse Width: 0.5 ms
Lead Channel Pacing Threshold Pulse Width: 0.5 ms
Lead Channel Sensing Intrinsic Amplitude: 1.7 mV
Lead Channel Sensing Intrinsic Amplitude: 6.6 mV
Lead Channel Setting Pacing Amplitude: 2 V
Lead Channel Setting Pacing Amplitude: 2.5 V
Lead Channel Setting Pacing Pulse Width: 0.5 ms
Lead Channel Setting Sensing Sensitivity: 0.5 mV
Pulse Gen Serial Number: 7179751

## 2020-01-29 NOTE — Telephone Encounter (Signed)
Received telephone call from Jonette Mate (caregiver) states that patient was told to follow up with [name] in the next 2 weeks due to recent episodes of possibly losing LOC per caregiver. Will need to send to [name] due to no availabilities in appointments. Please call Mr. Groleau [phone].

## 2020-01-29 NOTE — Telephone Encounter (Signed)
  Scheduled remote. NSVT noted. Pt denies symptoms of tachy palpitations, syncope, or near syncope.   Pt states he has had issues with low BP and medications have been adjusted.   He reports yesterday afternoon (after 1 pm, no correlation with episodes) he was sitting at his desk when he had lightheadedness and mild blurry vision consistent with when his BP has dropped in the past. He rested for a little while and his symptoms improved after about 1 minute.   He says on at least one occasion his blood pressure has dropped to the point that he has had "trouble speaking".  On another occasion he felt a "tingling" in his face. He is not able to elaborate on the timing or circumstances of these symptoms.  I offered him follow up in [place] to review his medication and symptoms. He states he knows he has follow up with [name] soon and would prefer to call and have that moved up.   I reviewed s/s of stroke including trouble speaking/slurred speech, unilateral weakness, and/or facial droop. He understands to call 911 if he has these symptoms, especially if they do not resolve.   Of note, patient has h/o right subcortical infarct CVA in 2006.  I will forward to [name] as an Juluis Rainier, and encouraged the patient multiple times to follow up promptly, or to call 911 with any worsening symptoms or s/s concerning for CVA.

## 2020-01-29 NOTE — Telephone Encounter (Signed)
Thank you for the update.  We will get him scheduled to see Jonni Sanger sooner in the [place] office to review his blood pressure, go over medications, check orthostatics.

## 2020-01-29 NOTE — Telephone Encounter (Signed)
Patient informed and verbalized understanding of plan. Scheduled with Jonni Sanger 02/01/20 @3 :00 pm

## 2020-01-31 NOTE — Progress Notes (Signed)
Remote ICD transmission.   

## 2020-02-01 ENCOUNTER — Encounter: Payer: Self-pay | Admitting: Family Medicine

## 2020-02-01 ENCOUNTER — Ambulatory Visit (INDEPENDENT_AMBULATORY_CARE_PROVIDER_SITE_OTHER): Payer: Medicare Other | Admitting: Family Medicine

## 2020-02-01 VITALS — BP 142/64 | HR 58 | Ht 68.0 in | Wt 164.0 lb

## 2020-02-01 DIAGNOSIS — R42 Dizziness and giddiness: Secondary | ICD-10-CM | POA: Diagnosis not present

## 2020-02-01 DIAGNOSIS — I10 Essential (primary) hypertension: Secondary | ICD-10-CM | POA: Diagnosis not present

## 2020-02-01 DIAGNOSIS — I2510 Atherosclerotic heart disease of native coronary artery without angina pectoris: Secondary | ICD-10-CM

## 2020-02-01 DIAGNOSIS — I255 Ischemic cardiomyopathy: Secondary | ICD-10-CM | POA: Diagnosis not present

## 2020-02-01 DIAGNOSIS — I959 Hypotension, unspecified: Secondary | ICD-10-CM | POA: Diagnosis not present

## 2020-02-01 DIAGNOSIS — N184 Chronic kidney disease, stage 4 (severe): Secondary | ICD-10-CM | POA: Diagnosis not present

## 2020-02-01 DIAGNOSIS — Z95810 Presence of automatic (implantable) cardiac defibrillator: Secondary | ICD-10-CM | POA: Diagnosis not present

## 2020-02-01 NOTE — Progress Notes (Signed)
Cardiology Office Note  Date: 02/01/2020   ID: Clayton Morton, DOB 10-12-1932, MRN [id number]  PCP:  Glenda Chroman, MD  Cardiologist:  Rozann Lesches, MD Electrophysiologist:  Thompson Grayer, MD   Chief Complaint: Follow-up CAD, ischemic cardiomyopathy, chronic systolic heart failure, CKD stage IV, AICD, CVA, HLD, CAD, NSTEMI in 2005, CABG 2005 (SVG-PDA, SVG PLA, LIMA-LAD, SVG-diagonal), DM 2  History of Present Illness: Clayton Morton is a 86 y.o. male with a history of CAD, ischemic cardiomyopathy, chronic systolic heart failure, CKD stage IV.  Last encounter with [name] November 12, 2019.  He had been doing well although had a few episodes of where he felt weak and syncopal usually around the midday area.  He had an event in August where his blood pressure was low although it did resolve.  He was following with [name] was delayed due to ICD in place.  He was continuing to follow with [name] for stage IV kidney disease with GFR in 13-15 range.   Recent telephone note from [name], PA-C.  Patient had a recently  scheduled remote device check where NSVT was noted on 2 occasions on January 28, 2020 first occurrence 10:35 AM, and second occurrence at 12:11 AM each lasting only 6 seconds each..  He denied any symptoms of tachypalpitations, syncope, or near syncope.  Stated he had experienced issue with low blood pressure and medications had been adjusted.  He reported the prior day he had one episode where he was sitting at his desk and had lightheadedness and mild blurry vision consistent when his blood pressure dropped in the past.  He rested for a little while and symptoms improved after about 1 minute. On a least 1 occasion his blood pressure had dropped to the point that he had trouble speaking and on another occasion had felt tingling in his face.  He was unable to elaborate on timing or circumstances of symptoms.  He had a history of CVA in 2006   He presents today  with no particular complaints.  He denies any lightheadedness, dizziness, presyncopal, or syncopal episodes.  He states during the episode when he was sitting at the desk had some transient blurred vision (he does have macular degeneration).  He denied lightheadedness or dizziness.  He states he did not check his blood sugar level during that episode.  EKG today shows sinus bradycardia rate of 54 with first-degree AV block, LAD, RBBB, lateral infarct age undetermined, inferior infarct age undetermined.  He denies any significant DOE or anginal symptoms.  He is not very active on a daily basis.  He walks with a cane.  Orthostatic blood pressures checked today with no significant changes.  Past Medical History:  Diagnosis Date  . Age-related macular degeneration, wet, right eye ([place])   . AICD (automatic cardioverter/defibrillator) present 10/30/2014  . Cerebrovascular accident [facility])    Right subcortical infarct 2006  . Chronic systolic congestive heart failure ([place])    a) ECHO (02/2012): EF 30-35%, no obvious thrombus b) ECHO (11/2013): EF 10%, AV mildly calcified, mild MR, RV mod dialted and sys fx sev reduced, TAPSE 0.8, lateral annulus peak S velocity 5.4 c) RHC (11/29/13): RA 15, RV: 65/19, PA 49%, CO/CI: 3.25 / 1.7 d) RHC (12/03/13): RA 1, RV 25/1/2, PA 27/10 (16), PCWP 6, Fick CO/CI: 5.3 / 2.9, PVR 1.9 WU, PA sat 72 and 74%   . Coronary atherosclerosis of native coronary artery    1) CABG 2005 2)  LHC (910/15): patent SVG to PDA, occlusion of SVG sequence to PLA, patency of LIMA to LAD and patency of SVG to diagonal with sev stenosis at coronary anastomotic site  . Diabetes mellitus, type 2 ([place])   . Essential hypertension   . History of gout   . Hyperlipidemia   . Ischemic cardiomyopathy   . Kidney stones   . Macular degeneration    Dr. [name]  . Non-ST elevation MI (NSTEMI) ([place])    2005  . Varicose veins     Past Surgical History:  Procedure Laterality Date  . CARDIAC CATHETERIZATION     . CARDIAC DEFIBRILLATOR PLACEMENT  10/30/2014  . CATARACT EXTRACTION W/ INTRAOCULAR LENS  IMPLANT, BILATERAL Bilateral ~ 1991  . CORONARY ANGIOPLASTY    . CORONARY ARTERY BYPASS GRAFT  2005   LIMA to LAD, SVG diagonal, SVG to PDA and PLA  . CYSTOSCOPY W/ LITHOLAPAXY / EHL    . EP IMPLANTABLE DEVICE N/A 10/30/2014   Procedure: ICD Implant;  Surgeon: Deboraha Sprang, MD, SJM Ellipse DR implanted for primary prevention  . LEFT AND RIGHT HEART CATHETERIZATION WITH CORONARY/GRAFT ANGIOGRAM N/A 11/29/2013   Procedure: LEFT AND RIGHT HEART CATHETERIZATION WITH Beatrix Fetters;  Surgeon: Blane Ohara, MD;  Location: [facility] CATH LAB;  Service: Cardiovascular;  Laterality: N/A;  . RIGHT HEART CATHETERIZATION N/A 12/03/2013   Procedure: RIGHT HEART CATH;  Surgeon: Jolaine Artist, MD;  Location: [facility] CATH LAB;  Service: Cardiovascular;  Laterality: N/A;  . TONSILLECTOMY      Current Outpatient Medications  Medication Sig Dispense Refill  . allopurinol (ZYLOPRIM) 100 MG tablet Take 1 tablet by mouth daily.    [name] ALPRAZolam (XANAX) 0.25 MG tablet Take 0.5 tablets by mouth daily.  0  . aspirin 81 MG tablet Take 81 mg by mouth daily as needed.     [name] atorvastatin (LIPITOR) 80 MG tablet take 1 tablet by mouth once daily 90 tablet 3  . carvedilol (COREG) 12.5 MG tablet TAKE ONE TABLET BY MOUTH TWICE DAILY. TAKE WITH A MEAL. 180 tablet 2  . furosemide (LASIX) 20 MG tablet Take 20 mg by mouth 2 (two) times daily.    [name] glipiZIDE (GLUCOTROL XL) 5 MG 24 hr tablet Take 1 tablet by mouth 2 (two) times daily.  1  . hydrALAZINE (APRESOLINE) 50 MG tablet Take 1 tablet (50 mg total) by mouth 3 (three) times daily. 90 tablet 6  . hydrochlorothiazide (HYDRODIURIL) 50 MG tablet Take 50 mg by mouth every morning. & 100 mg in the evening    . isosorbide mononitrate (IMDUR) 60 MG 24 hr tablet TAKE ONE TABLET BY MOUTH DAILY 90 tablet 3  . Omega-3 Fatty Acids (FISH OIL) 1000 MG CAPS Take 1 capsule by mouth 2 (two) times  daily.    . ONE TOUCH ULTRA TEST test strip 1 each by Other route 2 (two) times daily.   0  . repaglinide (PRANDIN) 1 MG tablet Take 1 mg by mouth daily.    [name] rOPINIRole (REQUIP) 0.25 MG tablet Take 0.5 mg by mouth at bedtime.    [name] SYNTHROID 75 MCG tablet Take 1 tablet by mouth daily.     No current facility-administered medications for this visit.   Allergies:  Tetracycline   Social History: The patient  reports that he has never smoked. He has never used smokeless tobacco. He reports that he does not drink alcohol and does not use drugs.   Family History: The patient's family history includes  Dementia in his mother; Hypertension in his father.   ROS:  Please see the history of present illness. Otherwise, complete review of systems is positive for none.  All other systems are reviewed and negative.   Physical Exam: VS:  BP (!) 142/64   Pulse (!) 58   Ht 5\' 8"  (1.727 m)   Wt 164 lb (74.4 kg)   SpO2 97%   BMI 24.94 kg/m , BMI Body mass index is 24.94 kg/m.  Wt Readings from Last 3 Encounters:  02/01/20 164 lb (74.4 kg)  11/12/19 162 lb 6.4 oz (73.7 kg)  07/09/19 164 lb 9.6 oz (74.7 kg)    General: Patient appears comfortable at rest. Neck: Supple, no elevated JVP or carotid bruits, no thyromegaly. Lungs: Clear to auscultation, nonlabored breathing at rest. Cardiac: Regular rate and rhythm, no S3 or significant systolic murmur, no pericardial rub. Extremities: No pitting edema, distal pulses 2+. Skin: Warm and dry. Musculoskeletal: No kyphosis. Neuropsychiatric: Alert and oriented x3, affect grossly appropriate.  ECG:  An ECG dated 02/01/2020 was personally reviewed today and demonstrated:  Sinus bradycardia with first-degree AV block, left axis deviation, right bundle branch block rate of 54.  Recent Labwork: No results found for requested labs within last 8760 hours.     Component Value Date/Time   CHOL 111 08/05/2015 1448   TRIG 156 (H) 08/05/2015 1448   HDL 31 (L)  08/05/2015 1448   CHOLHDL 3.6 08/05/2015 1448   VLDL 31 08/05/2015 1448   LDLCALC 49 08/05/2015 1448    Other Studies Reviewed Today:  Echocardiogram 11/29/2018: 1. Stage 1: N/A: Basal and mid inferior wall is abnormal.  2. The left ventricle has severely reduced systolic function, with an  ejection fraction of 25-30%. The cavity size was mildly dilated. There is  severe concentric left ventricular hypertrophy. Left ventricular diastolic  Doppler parameters are consistent  with impaired relaxation. Left ventricular diffuse hypokinesis.  3. The right ventricle has moderately reduced systolic function. The  cavity was normal. There is mildly increased right ventricular wall  thickness.  4. The aortic valve is tricuspid. Moderate thickening of the aortic  valve. Mild calcification of the aortic valve. Aortic valve regurgitation  is mild by color flow Doppler. Mild-moderate stenosis of the aortic valve.  Moderate aortic annular  calcification noted.  5. The mitral valve is degenerative. Moderate thickening of the mitral  valve leaflet. There is mild to moderate mitral annular calcification  present.  6. The tricuspid valve is grossly normal.  7. The aorta is normal unless otherwise noted.  8. The interatrial septum was not well visualized.   Assessment and Plan:   1. Lightheadedness Patient denies any lightheadedness or dizziness.  He states during the episode when he had blurred vision he denied any lightheadedness.  He does have a history of macular degeneration.  He has diabetes and states occasionally his blood sugar will drop and he may feel some blurred vision.  He did not check his blood sugar at the time this occurred.  2. Hypotension, unspecified hypotension type Orthostatic vital signs were checked today on arrival and there were no significant changes from lying, to sitting, to standing.  Initial blood pressure today 142/64.  Initial heart rate 58.  3. CAD in  native artery Denies any recent anginal or exertional symptoms.  Continue aspirin 81 mg daily.  Continue Imdur 60 mg p.o. daily.  4. AICD (automatic cardioverter/defibrillator) present /NSVT episodes x2 noted on November 8 at 10:35 AM and  12:11 AM each 6 seconds in duration. Recent AICD interrogation demonstrated 2 episodes of nonsustained ventricular tachycardia.  The first episode occurred at 10:35 AM on November 8 lasting 6 seconds.  The second occurred at 12:11 AM on the same day lasting only 6 seconds.  Patient states he cannot remember if he had any symptoms.  We will check electrolytes and thyroid function to see if possible contribution to arrhythmia.  5. Ischemic cardiomyopathy Most recent echocardiogram on 11/29/2018 demonstrated severely reduced systolic function with EF of 25 to 30%.  Severe LVH, diffuse LV hypokinesis, mild at aortic valve regurgitation.  Mild to moderate aortic stenosis.  Continue carvedilol 12.5 mg p.o. twice daily.  Continue Lasix 20 mg p.o. twice daily.  Continue HCTZ 50 mg daily.  Continue Imdur 60 mg daily.  6. Chronic kidney disease (CKD), stage IV (severe) (HCC) Sees [name] for stage IV renal disease. Last chemistry we have from [name] demonstrates creatinine of 5.25, estimated GFR of what appears to be 15 On scanned up paperwork from his office.  We do not have any more recent lab work.  We are ordering basic metabolic panel with magnesium as well as thyroid panel.  7.  Hypertension Blood pressure today is 142/64.  Continue carvedilol 12.5 mg p.o. twice daily.  Continue Lasix 20 mg p.o. twice daily.  Continue hydralazine 50 mg p.o. 3 times daily.  Continue HCTZ 50 mg every a.m. and 100 mg in the evening.   Medication Adjustments/Labs and Tests Ordered: Current medicines are reviewed at length with the patient today.  Concerns regarding medicines are outlined above.   Disposition: Follow-up with [name] or APP 1 month  Signed, Levell July,  NP 02/01/2020 4:40 PM    [facility] at [place], [place], [place] [address] Phone: [phone]; Fax: [phone]

## 2020-02-01 NOTE — Patient Instructions (Signed)
Medication Instructions:  Continue all current medications.  Labwork:  BMET, TSH, Magnesium - orders given today.   Office will contact with results via phone or letter.    Testing/Procedures: none  Follow-Up: 1 month   Any Other Special Instructions Will Be Listed Below (If Applicable).  If you need a refill on your cardiac medications before your next appointment, please call your pharmacy.

## 2020-02-04 NOTE — Addendum Note (Signed)
Addended by: Laurine Blazer on: 02/04/2020 04:06 PM   Modules accepted: Orders

## 2020-02-19 ENCOUNTER — Other Ambulatory Visit: Payer: Self-pay | Admitting: Cardiology

## 2020-02-26 ENCOUNTER — Ambulatory Visit (INDEPENDENT_AMBULATORY_CARE_PROVIDER_SITE_OTHER): Payer: Medicare Other

## 2020-02-26 DIAGNOSIS — Z95810 Presence of automatic (implantable) cardiac defibrillator: Secondary | ICD-10-CM

## 2020-02-26 DIAGNOSIS — I5022 Chronic systolic (congestive) heart failure: Secondary | ICD-10-CM | POA: Diagnosis not present

## 2020-02-27 NOTE — Progress Notes (Signed)
EPIC Encounter for ICM Monitoring  Patient Name: Clayton Morton is a 86 y.o. male Date: 02/27/2020 Primary Care Physican: Glenda Chroman, MD Primary Cardiologist: Domenic Polite Electrophysiologist: Allred Nephrologist: Theador Hawthorne 11/12/2019 OfficeWeight:162lbs  AT/AF Burden: <1%  Transmission reviewed.  CorVueThoracic impedancenormal.  Prescribed:  Furosemide20 mgtake1tablet (20 MGtotal)two times daily  Hydrochlorothiazide 50 mgTake 50 mg by mouth every morning & 100 mg in the evening  Labs: 12/15/2018 Creatinine 3.05, BUN 75, Potassium 4.0, Sodium 141. GFR 20-24 09/27/2018 Creatinine 3.25, BUN 64, Potassium 5.4, Sodium 144, GFR 17-19  Recommendations:No changes  Follow-up plan: ICM clinic phone appointment on1/01/2021. 91 day device clinic remote transmission2/10/2020.  EP/Cardiology Office Visits:1/3/2022with Dr.McDowell.05/23/2020 with Dr Rayann Heman.  Copy of ICM check sent to Dr.Allred  3 month ICM trend: 02/26/2020    1 Year ICM trend:       Rosalene Billings, RN 02/27/2020 5:13 PM

## 2020-02-29 DIAGNOSIS — Z9889 Other specified postprocedural states: Secondary | ICD-10-CM | POA: Diagnosis not present

## 2020-02-29 DIAGNOSIS — H353213 Exudative age-related macular degeneration, right eye, with inactive scar: Secondary | ICD-10-CM | POA: Diagnosis not present

## 2020-02-29 DIAGNOSIS — H353221 Exudative age-related macular degeneration, left eye, with active choroidal neovascularization: Secondary | ICD-10-CM | POA: Diagnosis not present

## 2020-02-29 DIAGNOSIS — H353230 Exudative age-related macular degeneration, bilateral, stage unspecified: Secondary | ICD-10-CM | POA: Diagnosis not present

## 2020-03-02 NOTE — Progress Notes (Deleted)
Cardiology Office Note  Date: 03/03/2020   ID: Clayton Morton, DOB 1932/04/17, MRN [id number]  PCP:  Glenda Chroman, MD  Cardiologist:  Rozann Lesches, MD Electrophysiologist:  Thompson Grayer, MD   Chief Complaint: Follow-up CAD, ischemic cardiomyopathy, chronic systolic heart failure, CKD stage IV, AICD, CVA, HLD, CAD, NSTEMI in 2005, CABG 2005 (SVG-PDA, SVG PLA, LIMA-LAD, SVG-diagonal), DM 2  History of Present Illness: Clayton Morton is a 86 y.o. male with a history of CAD, ischemic cardiomyopathy, chronic systolic heart failure, CKD stage IV.  Last encounter with [name] November 12, 2019.  He had been doing well although had a few episodes of where he felt weak and syncopal usually around the midday area.  He had an event in August where his blood pressure was low although it did resolve.  He was following with [name] was delayed due to ICD in place.  He was continuing to follow with [name] for stage IV kidney disease with GFR in 13-15 range.   Recent telephone note from [name], PA-C.  Patient had a recently  scheduled remote device check where NSVT was noted on 2 occasions on January 28, 2020 first occurrence 10:35 AM, and second occurrence at 12:11 AM each lasting only 6 seconds each..  He denied any symptoms of tachypalpitations, syncope, or near syncope.  At last visit he presented with no particular complaints.  He denied any lightheadedness, dizziness, presyncopal, or syncopal episodes.   He denied lightheadedness or dizziness. .  EKG showed sinus bradycardia rate of 54 with first-degree AV block, LAD, RBBB, lateral infarct age undetermined, inferior infarct age undetermined.  He denies any significant DOE or anginal symptoms.  He was not very active on a daily basis.  Was walking with a cane.  Orthostatic blood pressures were checked checked t with no significant changes.    Past Medical History:  Diagnosis Date  . Age-related macular degeneration, wet,  right eye ([place])   . AICD (automatic cardioverter/defibrillator) present 10/30/2014  . Cerebrovascular accident [facility])    Right subcortical infarct 2006  . Chronic systolic congestive heart failure ([place])    a) ECHO (02/2012): EF 30-35%, no obvious thrombus b) ECHO (11/2013): EF 10%, AV mildly calcified, mild MR, RV mod dialted and sys fx sev reduced, TAPSE 0.8, lateral annulus peak S velocity 5.4 c) RHC (11/29/13): RA 15, RV: 65/19, PA 49%, CO/CI: 3.25 / 1.7 d) RHC (12/03/13): RA 1, RV 25/1/2, PA 27/10 (16), PCWP 6, Fick CO/CI: 5.3 / 2.9, PVR 1.9 WU, PA sat 72 and 74%   . Coronary atherosclerosis of native coronary artery    1) CABG 2005 2) LHC (910/15): patent SVG to PDA, occlusion of SVG sequence to PLA, patency of LIMA to LAD and patency of SVG to diagonal with sev stenosis at coronary anastomotic site  . Diabetes mellitus, type 2 ([place])   . Essential hypertension   . History of gout   . Hyperlipidemia   . Ischemic cardiomyopathy   . Kidney stones   . Macular degeneration    Dr. [name]  . Non-ST elevation MI (NSTEMI) ([place])    2005  . Varicose veins     Past Surgical History:  Procedure Laterality Date  . CARDIAC CATHETERIZATION    . CARDIAC DEFIBRILLATOR PLACEMENT  10/30/2014  . CATARACT EXTRACTION W/ INTRAOCULAR LENS  IMPLANT, BILATERAL Bilateral ~ 1991  . CORONARY ANGIOPLASTY    . CORONARY ARTERY BYPASS GRAFT  2005   LIMA to  LAD, SVG diagonal, SVG to PDA and PLA  . CYSTOSCOPY W/ LITHOLAPAXY / EHL    . EP IMPLANTABLE DEVICE N/A 10/30/2014   Procedure: ICD Implant;  Surgeon: Deboraha Sprang, MD, SJM Ellipse DR implanted for primary prevention  . LEFT AND RIGHT HEART CATHETERIZATION WITH CORONARY/GRAFT ANGIOGRAM N/A 11/29/2013   Procedure: LEFT AND RIGHT HEART CATHETERIZATION WITH Beatrix Fetters;  Surgeon: Blane Ohara, MD;  Location: [facility] CATH LAB;  Service: Cardiovascular;  Laterality: N/A;  . RIGHT HEART CATHETERIZATION N/A 12/03/2013   Procedure: RIGHT HEART CATH;  Surgeon:  Jolaine Artist, MD;  Location: [facility] CATH LAB;  Service: Cardiovascular;  Laterality: N/A;  . TONSILLECTOMY      Current Outpatient Medications  Medication Sig Dispense Refill  . allopurinol (ZYLOPRIM) 100 MG tablet Take 1 tablet by mouth daily.    [name] ALPRAZolam (XANAX) 0.25 MG tablet Take 0.5 tablets by mouth daily.  0  . aspirin 81 MG tablet Take 81 mg by mouth daily as needed.     [name] atorvastatin (LIPITOR) 80 MG tablet take 1 tablet by mouth once daily 90 tablet 3  . carvedilol (COREG) 12.5 MG tablet TAKE ONE TABLET BY MOUTH TWICE DAILY. TAKE WITH A MEAL. 180 tablet 2  . furosemide (LASIX) 20 MG tablet Take 20 mg by mouth 2 (two) times daily.    [name] glipiZIDE (GLUCOTROL XL) 5 MG 24 hr tablet Take 1 tablet by mouth 2 (two) times daily.  1  . hydrALAZINE (APRESOLINE) 50 MG tablet Take 1 tablet (50 mg total) by mouth 3 (three) times daily. 90 tablet 6  . hydrochlorothiazide (HYDRODIURIL) 50 MG tablet Take 50 mg by mouth every morning. & 100 mg in the evening    . isosorbide mononitrate (IMDUR) 60 MG 24 hr tablet TAKE ONE TABLET BY MOUTH DAILY 90 tablet 3  . Omega-3 Fatty Acids (FISH OIL) 1000 MG CAPS Take 1 capsule by mouth 2 (two) times daily.    . ONE TOUCH ULTRA TEST test strip 1 each by Other route 2 (two) times daily.   0  . repaglinide (PRANDIN) 1 MG tablet Take 1 mg by mouth daily.    [name] rOPINIRole (REQUIP) 0.25 MG tablet Take 0.5 mg by mouth at bedtime.    [name] SYNTHROID 75 MCG tablet Take 1 tablet by mouth daily.     No current facility-administered medications for this visit.   Allergies:  Tetracycline   Social History: The patient  reports that he has never smoked. He has never used smokeless tobacco. He reports that he does not drink alcohol and does not use drugs.   Family History: The patient's family history includes Dementia in his mother; Hypertension in his father.   ROS:  Please see the history of present illness. Otherwise, complete review of systems is positive for none.   All other systems are reviewed and negative.   Physical Exam: VS:  There were no vitals taken for this visit., BMI There is no height or weight on file to calculate BMI.  Wt Readings from Last 3 Encounters:  02/01/20 164 lb (74.4 kg)  11/12/19 162 lb 6.4 oz (73.7 kg)  07/09/19 164 lb 9.6 oz (74.7 kg)    General: Patient appears comfortable at rest. Neck: Supple, no elevated JVP or carotid bruits, no thyromegaly. Lungs: Clear to auscultation, nonlabored breathing at rest. Cardiac: Regular rate and rhythm, no S3 or significant systolic murmur, no pericardial rub. Extremities: No pitting edema, distal pulses 2+. Skin: Warm and  dry. Musculoskeletal: No kyphosis. Neuropsychiatric: Alert and oriented x3, affect grossly appropriate.  ECG:  An ECG dated 02/01/2020 was personally reviewed today and demonstrated:  Sinus bradycardia with first-degree AV block, left axis deviation, right bundle branch block rate of 54.  Recent Labwork: No results found for requested labs within last 8760 hours.     Component Value Date/Time   CHOL 111 08/05/2015 1448   TRIG 156 (H) 08/05/2015 1448   HDL 31 (L) 08/05/2015 1448   CHOLHDL 3.6 08/05/2015 1448   VLDL 31 08/05/2015 1448   LDLCALC 49 08/05/2015 1448    Other Studies Reviewed Today:  Echocardiogram 11/29/2018: 1. Stage 1: N/A: Basal and mid inferior wall is abnormal.  2. The left ventricle has severely reduced systolic function, with an  ejection fraction of 25-30%. The cavity size was mildly dilated. There is  severe concentric left ventricular hypertrophy. Left ventricular diastolic  Doppler parameters are consistent  with impaired relaxation. Left ventricular diffuse hypokinesis.  3. The right ventricle has moderately reduced systolic function. The  cavity was normal. There is mildly increased right ventricular wall  thickness.  4. The aortic valve is tricuspid. Moderate thickening of the aortic  valve. Mild calcification of the  aortic valve. Aortic valve regurgitation  is mild by color flow Doppler. Mild-moderate stenosis of the aortic valve.  Moderate aortic annular  calcification noted.  5. The mitral valve is degenerative. Moderate thickening of the mitral  valve leaflet. There is mild to moderate mitral annular calcification  present.  6. The tricuspid valve is grossly normal.  7. The aorta is normal unless otherwise noted.  8. The interatrial septum was not well visualized.   Assessment and Plan:   1. Lightheadedness Patient denies any lightheadedness or dizziness.  He states during the episode when he had blurred vision he denied any lightheadedness.  He does have a history of macular degeneration.  He has diabetes and states occasionally his blood sugar will drop and he may feel some blurred vision.  He did not check his blood sugar at the time this occurred.  2. Hypotension, unspecified hypotension type Orthostatic vital signs were checked today on arrival and there were no significant changes from lying, to sitting, to standing.  Initial blood pressure today 142/64.  Initial heart rate 58.  3. CAD in native artery Denies any recent anginal or exertional symptoms.  Continue aspirin 81 mg daily.  Continue Imdur 60 mg p.o. daily.  4. AICD (automatic cardioverter/defibrillator) present /NSVT episodes x2 noted on November 8 at 10:35 AM and 12:11 AM each 6 seconds in duration. Recent AICD interrogation demonstrated 2 episodes of nonsustained ventricular tachycardia.  The first episode occurred at 10:35 AM on November 8 lasting 6 seconds.  The second occurred at 12:11 AM on the same day lasting only 6 seconds.  Patient states he cannot remember if he had any symptoms.  We will check electrolytes and thyroid function to see if possible contribution to arrhythmia.  5. Ischemic cardiomyopathy Most recent echocardiogram on 11/29/2018 demonstrated severely reduced systolic function with EF of 25 to 30%.  Severe  LVH, diffuse LV hypokinesis, mild at aortic valve regurgitation.  Mild to moderate aortic stenosis.  Continue carvedilol 12.5 mg p.o. twice daily.  Continue Lasix 20 mg p.o. twice daily.  Continue HCTZ 50 mg daily.  Continue Imdur 60 mg daily.  6. Chronic kidney disease (CKD), stage IV (severe) (HCC) Sees [name] for stage IV renal disease. Last chemistry we have from Dr.  Bhutani demonstrates creatinine of 5.25, estimated GFR of what appears to be 15 On scanned up paperwork from his office.  We do not have any more recent lab work.  We are ordering basic metabolic panel with magnesium as well as thyroid panel.  7.  Hypertension Blood pressure today is 142/64.  Continue carvedilol 12.5 mg p.o. twice daily.  Continue Lasix 20 mg p.o. twice daily.  Continue hydralazine 50 mg p.o. 3 times daily.  Continue HCTZ 50 mg every a.m. and 100 mg in the evening.   Medication Adjustments/Labs and Tests Ordered: Current medicines are reviewed at length with the patient today.  Concerns regarding medicines are outlined above.   Disposition: Follow-up with [name] or APP 1 month  Signed, Levell July, NP 03/03/2020 2:49 PM    [facility] [place], [place], [place] [address] Phone: [phone]; Fax: [phone]

## 2020-03-03 ENCOUNTER — Ambulatory Visit: Payer: Medicare Other | Admitting: Family Medicine

## 2020-03-17 DIAGNOSIS — E1165 Type 2 diabetes mellitus with hyperglycemia: Secondary | ICD-10-CM | POA: Diagnosis not present

## 2020-03-17 DIAGNOSIS — I509 Heart failure, unspecified: Secondary | ICD-10-CM | POA: Diagnosis not present

## 2020-03-17 DIAGNOSIS — Z299 Encounter for prophylactic measures, unspecified: Secondary | ICD-10-CM | POA: Diagnosis not present

## 2020-03-17 DIAGNOSIS — F419 Anxiety disorder, unspecified: Secondary | ICD-10-CM | POA: Diagnosis not present

## 2020-03-17 DIAGNOSIS — H353221 Exudative age-related macular degeneration, left eye, with active choroidal neovascularization: Secondary | ICD-10-CM | POA: Diagnosis not present

## 2020-03-17 DIAGNOSIS — I10 Essential (primary) hypertension: Secondary | ICD-10-CM | POA: Diagnosis not present

## 2020-03-20 DIAGNOSIS — N184 Chronic kidney disease, stage 4 (severe): Secondary | ICD-10-CM | POA: Diagnosis not present

## 2020-03-20 DIAGNOSIS — I959 Hypotension, unspecified: Secondary | ICD-10-CM | POA: Diagnosis not present

## 2020-03-20 DIAGNOSIS — R42 Dizziness and giddiness: Secondary | ICD-10-CM | POA: Diagnosis not present

## 2020-03-20 DIAGNOSIS — I2510 Atherosclerotic heart disease of native coronary artery without angina pectoris: Secondary | ICD-10-CM | POA: Diagnosis not present

## 2020-03-20 DIAGNOSIS — E060 Acute thyroiditis: Secondary | ICD-10-CM | POA: Diagnosis not present

## 2020-03-24 ENCOUNTER — Ambulatory Visit: Payer: Medicare Other | Admitting: Cardiology

## 2020-03-24 NOTE — Progress Notes (Deleted)
Cardiology Office Note  Date: 03/24/2020   ID: Clayton Morton, DOB 1932-08-31, MRN [id number]  PCP:  Glenda Chroman, MD  Cardiologist:  Rozann Lesches, MD Electrophysiologist:  Thompson Grayer, MD   No chief complaint on file.   History of Present Illness: Clayton Morton is an 87 y.o. male last seen in November 2021 by Mr. Leonides Sake NP.  He sees [name], [employer] ICD in place.  Device interrogation in November 2021 showed 2 episodes of NSVT.  Thoracic impedance normal by device check in December 2021.  No interval lab work for review today.  Past Medical History:  Diagnosis Date  . Age-related macular degeneration, wet, right eye ([place])   . AICD (automatic cardioverter/defibrillator) present 10/30/2014  . Cerebrovascular accident [facility])    Right subcortical infarct 2006  . Chronic systolic congestive heart failure ([place])    a) ECHO (02/2012): EF 30-35%, no obvious thrombus b) ECHO (11/2013): EF 10%, AV mildly calcified, mild MR, RV mod dialted and sys fx sev reduced, TAPSE 0.8, lateral annulus peak S velocity 5.4 c) RHC (11/29/13): RA 15, RV: 65/19, PA 49%, CO/CI: 3.25 / 1.7 d) RHC (12/03/13): RA 1, RV 25/1/2, PA 27/10 (16), PCWP 6, Fick CO/CI: 5.3 / 2.9, PVR 1.9 WU, PA sat 72 and 74%   . Coronary atherosclerosis of native coronary artery    1) CABG 2005 2) LHC (910/15): patent SVG to PDA, occlusion of SVG sequence to PLA, patency of LIMA to LAD and patency of SVG to diagonal with sev stenosis at coronary anastomotic site  . Diabetes mellitus, type 2 ([place])   . Essential hypertension   . History of gout   . Hyperlipidemia   . Ischemic cardiomyopathy   . Kidney stones   . Macular degeneration    Dr. [name]  . Non-ST elevation MI (NSTEMI) ([place])    2005  . Varicose veins     Past Surgical History:  Procedure Laterality Date  . CARDIAC CATHETERIZATION    . CARDIAC DEFIBRILLATOR PLACEMENT  10/30/2014  . CATARACT EXTRACTION W/ INTRAOCULAR LENS  IMPLANT, BILATERAL Bilateral ~ 1991  .  CORONARY ANGIOPLASTY    . CORONARY ARTERY BYPASS GRAFT  2005   LIMA to LAD, SVG diagonal, SVG to PDA and PLA  . CYSTOSCOPY W/ LITHOLAPAXY / EHL    . EP IMPLANTABLE DEVICE N/A 10/30/2014   Procedure: ICD Implant;  Surgeon: Deboraha Sprang, MD, SJM Ellipse DR implanted for primary prevention  . LEFT AND RIGHT HEART CATHETERIZATION WITH CORONARY/GRAFT ANGIOGRAM N/A 11/29/2013   Procedure: LEFT AND RIGHT HEART CATHETERIZATION WITH Beatrix Fetters;  Surgeon: Blane Ohara, MD;  Location: [facility] C[facility] LAB;  Service: Cardiovascular;  Laterality: N/A;  . RIGHT HEART CATHETERIZATION N/A 12/03/2013   Procedure: RIGHT HEART CATH;  Surgeon: Jolaine Artist, MD;  Location[facility] C[facility] LAB;  Service: Cardiovascular;  Laterality: N/A;  . TONSILLECTOMY      Current Outpatient Medications  Medication Sig Dispense Refill  . allopurinol (ZYLOPRIM) 100 MG tablet Take 1 tablet by mouth daily.    [name] ALPRAZolam (XANAX) 0.25 MG tablet Take 0.5 tablets by mouth daily.  0  . aspirin 81 MG tablet Take 81 mg by mouth daily as needed.     [name] atorvastatin (LIPITOR) 80 MG tablet take 1 tablet by mouth once daily 90 tablet 3  . carvedilol (COREG) 12.5 MG tablet TAKE ONE TABLET BY MOUTH TWICE DAILY. TAKE WITH A MEAL. 180 tablet 2  . furosemide (LASIX)  20 MG tablet Take 20 mg by mouth 2 (two) times daily.    [name] glipiZIDE (GLUCOTROL XL) 5 MG 24 hr tablet Take 1 tablet by mouth 2 (two) times daily.  1  . hydrALAZINE (APRESOLINE) 50 MG tablet Take 1 tablet (50 mg total) by mouth 3 (three) times daily. 90 tablet 6  . hydrochlorothiazide (HYDRODIURIL) 50 MG tablet Take 50 mg by mouth every morning. & 100 mg in the evening    . isosorbide mononitrate (IMDUR) 60 MG 24 hr tablet TAKE ONE TABLET BY MOUTH DAILY 90 tablet 3  . Omega-3 Fatty Acids (FISH OIL) 1000 MG CAPS Take 1 capsule by mouth 2 (two) times daily.    . ONE TOUCH ULTRA TEST test strip 1 each by Other route 2 (two) times daily.   0  . repaglinide (PRANDIN) 1 MG tablet  Take 1 mg by mouth daily.    [name] rOPINIRole (REQUIP) 0.25 MG tablet Take 0.5 mg by mouth at bedtime.    [name] SYNTHROID 75 MCG tablet Take 1 tablet by mouth daily.     No current facility-administered medications for this visit.   Allergies:  Tetracycline   Social History: The patient  reports that he has never smoked. He has never used smokeless tobacco. He reports that he does not drink alcohol and does not use drugs.   Family History: The patient's family history includes Dementia in his mother; Hypertension in his father.   ROS:  Please see the history of present illness. Otherwise, complete review of systems is positive for {NONE DEFAULTED:18576::"none"}.  All other systems are reviewed and negative.   Physical Exam: VS:  There were no vitals taken for this visit., BMI There is no height or weight on file to calculate BMI.  Wt Readings from Last 3 Encounters:  02/01/20 164 lb (74.4 kg)  11/12/19 162 lb 6.4 oz (73.7 kg)  07/09/19 164 lb 9.6 oz (74.7 kg)    General: Patient appears comfortable at rest. HEENT: Conjunctiva and lids normal, oropharynx clear with moist mucosa. Neck: Supple, no elevated JVP or carotid bruits, no thyromegaly. Lungs: Clear to auscultation, nonlabored breathing at rest. Cardiac: Regular rate and rhythm, no S3 or significant systolic murmur, no pericardial rub. Abdomen: Soft, nontender, no hepatomegaly, bowel sounds present, no guarding or rebound. Extremities: No pitting edema, distal pulses 2+. Skin: Warm and dry. Musculoskeletal: No kyphosis. Neuropsychiatric: Alert and oriented x3, affect grossly appropriate.  ECG:  An ECG dated 02/01/2020 was personally reviewed today and demonstrated:  Atrial paced rhythm with right bundle branch block and left anterior fascicular block.  Recent Labwork:  June 2021: Creatinine 4.0  Other Studies Reviewed Today:  Echocardiogram 11/29/2018: 1. Stage 1: N/A: Basal and mid inferior wall is abnormal.  2. The left  ventricle has severely reduced systolic function, with an  ejection fraction of 25-30%. The cavity size was mildly dilated. There is  severe concentric left ventricular hypertrophy. Left ventricular diastolic  Doppler parameters are consistent  with impaired relaxation. Left ventricular diffuse hypokinesis.  3. The right ventricle has moderately reduced systolic function. The  cavity was normal. There is mildly increased right ventricular wall  thickness.  4. The aortic valve is tricuspid. Moderate thickening of the aortic  valve. Mild calcification of the aortic valve. Aortic valve regurgitation  is mild by color flow Doppler. Mild-moderate stenosis of the aortic valve.  Moderate aortic annular  calcification noted.  5. The mitral valve is degenerative. Moderate thickening of the mitral  valve  leaflet. There is mild to moderate mitral annular calcification  present.  6. The tricuspid valve is grossly normal.  7. The aorta is normal unless otherwise noted.  8. The interatrial septum was not well visualized.  Assessment and Plan:    Medication Adjustments/Labs and Tests Ordered: Current medicines are reviewed at length with the patient today.  Concerns regarding medicines are outlined above.   Tests Ordered: No orders of the defined types were placed in this encounter.   Medication Changes: No orders of the defined types were placed in this encounter.   Disposition:  Follow up {follow up:15908}  Signed, Satira Sark, MD, [facility] 03/24/2020 9:14 AM    [facility] [place], [place], [place] [address] Phone: [phone]; Fax: [phone]

## 2020-04-01 ENCOUNTER — Ambulatory Visit (INDEPENDENT_AMBULATORY_CARE_PROVIDER_SITE_OTHER): Payer: Medicare Other

## 2020-04-01 DIAGNOSIS — Z95810 Presence of automatic (implantable) cardiac defibrillator: Secondary | ICD-10-CM | POA: Diagnosis not present

## 2020-04-01 DIAGNOSIS — I5022 Chronic systolic (congestive) heart failure: Secondary | ICD-10-CM

## 2020-04-04 NOTE — Progress Notes (Signed)
EPIC Encounter for ICM Monitoring  Patient Name: Clayton Morton is a 87 y.o. male Date: 04/04/2020 Primary Care Physican: Glenda Chroman, MD Primary Cardiologist: Domenic Polite Electrophysiologist: Allred Nephrologist: Theador Hawthorne 11/122021 OfficeWeight:164lbs  AT/AF Burden: <1%  Transmission reviewed.  CorVueThoracic impedancenormal.  Prescribed:  Furosemide20 mgtake1tablet (20 MGtotal)two times daily  Hydrochlorothiazide 50 mgTake 50 mg by mouth every morning & 100 mg in the evening  Labs: 12/15/2018 Creatinine 3.05, BUN 75, Potassium 4.0, Sodium 141. GFR 20-24 09/27/2018 Creatinine 3.25, BUN 64, Potassium 5.4, Sodium 144, GFR 17-19  Recommendations:No changes  Follow-up plan: ICM clinic phone appointment on1/01/2021. 91 day device clinic remote transmission2/10/2020.  EP/Cardiology Office Visits:2/14/2022with Dr.McDowell.05/23/2020 with Dr Rayann Heman.  Copy of ICM check sent to Dr.Allred  3 month ICM trend: 04/01/2020.    1 Year ICM trend:       Rosalene Billings, RN 04/04/2020 10:06 AM

## 2020-04-17 ENCOUNTER — Ambulatory Visit (INDEPENDENT_AMBULATORY_CARE_PROVIDER_SITE_OTHER): Payer: Medicare Other | Admitting: Cardiology

## 2020-04-17 ENCOUNTER — Other Ambulatory Visit: Payer: Self-pay

## 2020-04-17 ENCOUNTER — Encounter: Payer: Self-pay | Admitting: Cardiology

## 2020-04-17 ENCOUNTER — Encounter: Payer: Self-pay | Admitting: *Deleted

## 2020-04-17 VITALS — BP 138/62 | HR 58 | Ht 68.0 in | Wt 163.0 lb

## 2020-04-17 DIAGNOSIS — I5022 Chronic systolic (congestive) heart failure: Secondary | ICD-10-CM | POA: Diagnosis not present

## 2020-04-17 DIAGNOSIS — I255 Ischemic cardiomyopathy: Secondary | ICD-10-CM

## 2020-04-17 NOTE — Progress Notes (Signed)
Cardiology Office Note  Date: 04/17/2020   ID: Clayton Morton, DOB 13-Aug-1932, MRN [id number]  PCP:  Glenda Chroman, MD  Cardiologist:  Rozann Lesches, MD Electrophysiologist:  Thompson Grayer, MD   Chief Complaint  Patient presents with  . Cardiac follow-up    History of Present Illness: Clayton Morton is an 87 y.o. male last seen in November 2021 by Mr. Leonides Sake NP.  He is here today with an assistant for follow-up visit.  Reports no further dizziness since adjustments in medications.  He had lab work in late December, results not in chart as yet, these are being requested.  He reports NYHA class II dyspnea with typical activities around the house.  I reviewed his medications which are outlined below, he states that he has been compliant.  He recalls recent systolic blood pressures in the 115-120 range.  Weight is stable.  He sees [name], St. Jude ICD in place.  Recent thoracic impedance was normal.  No device shocks or syncope.  Past Medical History:  Diagnosis Date  . Age-related macular degeneration, wet, right eye ([place])   . AICD (automatic cardioverter/defibrillator) present 10/30/2014  . Cerebrovascular accident [facility])    Right subcortical infarct 2006  . Chronic systolic congestive heart failure ([place])    a) ECHO (02/2012): EF 30-35%, no obvious thrombus b) ECHO (11/2013): EF 10%, AV mildly calcified, mild MR, RV mod dialted and sys fx sev reduced, TAPSE 0.8, lateral annulus peak S velocity 5.4 c) RHC (11/29/13): RA 15, RV: 65/19, PA 49%, CO/CI: 3.25 / 1.7 d) RHC (12/03/13): RA 1, RV 25/1/2, PA 27/10 (16), PCWP 6, Fick CO/CI: 5.3 / 2.9, PVR 1.9 WU, PA sat 72 and 74%   . Coronary atherosclerosis of native coronary artery    1) CABG 2005 2) LHC (910/15): patent SVG to PDA, occlusion of SVG sequence to PLA, patency of LIMA to LAD and patency of SVG to diagonal with sev stenosis at coronary anastomotic site  . Diabetes mellitus, type 2 ([place])   . Essential hypertension   . History of  gout   . Hyperlipidemia   . Ischemic cardiomyopathy   . Kidney stones   . Macular degeneration    Dr. [name]  . Non-ST elevation MI (NSTEMI) ([place])    2005  . Varicose veins     Past Surgical History:  Procedure Laterality Date  . CARDIAC CATHETERIZATION    . CARDIAC DEFIBRILLATOR PLACEMENT  10/30/2014  . CATARACT EXTRACTION W/ INTRAOCULAR LENS  IMPLANT, BILATERAL Bilateral ~ 1991  . CORONARY ANGIOPLASTY    . CORONARY ARTERY BYPASS GRAFT  2005   LIMA to LAD, SVG diagonal, SVG to PDA and PLA  . CYSTOSCOPY W/ LITHOLAPAXY / EHL    . EP IMPLANTABLE DEVICE N/A 10/30/2014   Procedure: ICD Implant;  Surgeon: Deboraha Sprang, MD, SJM Ellipse DR implanted for primary prevention  . LEFT AND RIGHT HEART CATHETERIZATION WITH CORONARY/GRAFT ANGIOGRAM N/A 11/29/2013   Procedure: LEFT AND RIGHT HEART CATHETERIZATION WITH Beatrix Fetters;  Surgeon: Blane Ohara, MD;  Location: [facility] CATH LAB;  Service: Cardiovascular;  Laterality: N/A;  . RIGHT HEART CATHETERIZATION N/A 12/03/2013   Procedure: RIGHT HEART CATH;  Surgeon: Jolaine Artist, MD;  Location: [facility] CATH LAB;  Service: Cardiovascular;  Laterality: N/A;  . TONSILLECTOMY      Current Outpatient Medications  Medication Sig Dispense Refill  . allopurinol (ZYLOPRIM) 100 MG tablet Take 1 tablet by mouth daily.    [name] ALPRAZolam (  XANAX) 0.25 MG tablet Take 0.5 tablets by mouth daily.  0  . aspirin 81 MG tablet Take 81 mg by mouth daily as needed.     [name] atorvastatin (LIPITOR) 80 MG tablet take 1 tablet by mouth once daily 90 tablet 3  . carvedilol (COREG) 12.5 MG tablet TAKE ONE TABLET BY MOUTH TWICE DAILY. TAKE WITH A MEAL. 180 tablet 2  . furosemide (LASIX) 20 MG tablet Take 20 mg by mouth 2 (two) times daily.    [name] glipiZIDE (GLUCOTROL XL) 5 MG 24 hr tablet Take 1 tablet by mouth 2 (two) times daily.  1  . hydrALAZINE (APRESOLINE) 50 MG tablet Take 1 tablet (50 mg total) by mouth 3 (three) times daily. 90 tablet 6  . hydrochlorothiazide  (HYDRODIURIL) 50 MG tablet Take 50 mg by mouth every morning. & 100 mg in the evening    . isosorbide mononitrate (IMDUR) 60 MG 24 hr tablet TAKE ONE TABLET BY MOUTH DAILY 90 tablet 3  . Omega-3 Fatty Acids (FISH OIL) 1000 MG CAPS Take 1 capsule by mouth 2 (two) times daily.    . ONE TOUCH ULTRA TEST test strip 1 each by Other route 2 (two) times daily.   0  . repaglinide (PRANDIN) 1 MG tablet Take 1 mg by mouth daily.    [name] rOPINIRole (REQUIP) 0.25 MG tablet Take 0.5 mg by mouth at bedtime.    [name] SYNTHROID 75 MCG tablet Take 1 tablet by mouth daily.     No current facility-administered medications for this visit.   Allergies:  Tetracycline   ROS: No palpitations or syncope.  Physical Exam: VS:  BP 138/62   Pulse (!) 58   Ht 5\' 8"  (1.727 m)   Wt 163 lb (73.9 kg)   SpO2 98%   BMI 24.78 kg/m , BMI Body mass index is 24.78 kg/m.  Wt Readings from Last 3 Encounters:  04/17/20 163 lb (73.9 kg)  02/01/20 164 lb (74.4 kg)  11/12/19 162 lb 6.4 oz (73.7 kg)    General: Elderly male, appears comfortable at rest. HEENT: Conjunctiva and lids normal, wearing a mask. Neck: Supple, no elevated JVP or carotid bruits, no thyromegaly. Lungs: Clear to auscultation, nonlabored breathing at rest. Cardiac: Regular rate and rhythm, no S3, 2/6 systolic murmur, no pericardial rub. Extremities: No pitting edema.  ECG:  An ECG dated 02/01/2020 was personally reviewed today and demonstrated:  Atrial paced rhythm with right bundle branch block and left anterior fascicular block.  Recent Labwork:  Follow-up lab work review pending.  Other Studies Reviewed Today:  Echocardiogram 11/29/2018: 1. Stage 1: N/A: Basal and mid inferior wall is abnormal.  2. The left ventricle has severely reduced systolic function, with an  ejection fraction of 25-30%. The cavity size was mildly dilated. There is  severe concentric left ventricular hypertrophy. Left ventricular diastolic  Doppler parameters are consistent   with impaired relaxation. Left ventricular diffuse hypokinesis.  3. The right ventricle has moderately reduced systolic function. The  cavity was normal. There is mildly increased right ventricular wall  thickness.  4. The aortic valve is tricuspid. Moderate thickening of the aortic  valve. Mild calcification of the aortic valve. Aortic valve regurgitation  is mild by color flow Doppler. Mild-moderate stenosis of the aortic valve.  Moderate aortic annular  calcification noted.  5. The mitral valve is degenerative. Moderate thickening of the mitral  valve leaflet. There is mild to moderate mitral annular calcification  present.  6. The tricuspid valve  is grossly normal.  7. The aorta is normal unless otherwise noted.  8. The interatrial septum was not well visualized.   Assessment and Plan:  1.  Ischemic cardiomyopathy with chronic systolic heart failure, LVEF 25 to 30% and moderate RV dysfunction.  Weight is stable and he reports NYHA class II dyspnea with typical activities.  Requesting recent lab work from [facility].  For now continue Coreg, Lasix, hydralazine, Imdur, aspirin, and Lipitor.  2.  CKD stage IV-V.  He has decided not to follow-up with [name] any longer.  3.  CAD status post CABG, medically managed graft disease at this point.  No active angina.  4.  St. Jude ICD in place with follow-up by [name].  Recent device interrogation indicated normal thoracic impedance.  No device shocks or syncope.  Medication Adjustments/Labs and Tests Ordered: Current medicines are reviewed at length with the patient today.  Concerns regarding medicines are outlined above.   Tests Ordered: No orders of the defined types were placed in this encounter.   Medication Changes: No orders of the defined types were placed in this encounter.   Disposition:  Follow up 3 months in the [place] office.  Signed, Satira Sark, MD, [facility] 04/17/2020 5:09 PM    [facility] [facility] [place], [place], [place] [address] Phone: [phone]; Fax: [phone]

## 2020-04-17 NOTE — Patient Instructions (Addendum)
Medication Instructions:   Your physician recommends that you continue on your current medications as directed. Please refer to the Current Medication list given to you today.  Labwork:  none  Testing/Procedures:  none  Follow-Up:  Your physician recommends that you schedule a follow-up appointment in: 3 months.  Any Other Special Instructions Will Be Listed Below (If Applicable).  If you need a refill on your cardiac medications before your next appointment, please call your pharmacy. 

## 2020-04-21 DIAGNOSIS — G4700 Insomnia, unspecified: Secondary | ICD-10-CM | POA: Diagnosis not present

## 2020-04-21 DIAGNOSIS — E7800 Pure hypercholesterolemia, unspecified: Secondary | ICD-10-CM | POA: Diagnosis not present

## 2020-04-21 DIAGNOSIS — I10 Essential (primary) hypertension: Secondary | ICD-10-CM | POA: Diagnosis not present

## 2020-04-21 DIAGNOSIS — I5022 Chronic systolic (congestive) heart failure: Secondary | ICD-10-CM | POA: Diagnosis not present

## 2020-04-29 ENCOUNTER — Ambulatory Visit (INDEPENDENT_AMBULATORY_CARE_PROVIDER_SITE_OTHER): Payer: Medicare Other

## 2020-04-29 DIAGNOSIS — R001 Bradycardia, unspecified: Secondary | ICD-10-CM

## 2020-04-29 LAB — CUP PACEART REMOTE DEVICE CHECK
Battery Remaining Longevity: 35 mo
Battery Remaining Percentage: 38 %
Battery Voltage: 2.87 V
Brady Statistic AP VP Percent: 7.5 %
Brady Statistic AP VS Percent: 9.7 %
Brady Statistic AS VP Percent: 1 %
Brady Statistic AS VS Percent: 81 %
Brady Statistic RA Percent Paced: 14 %
Brady Statistic RV Percent Paced: 7.7 %
Date Time Interrogation Session: 20220208020018
HighPow Impedance: 66 Ohm
HighPow Impedance: 66 Ohm
Implantable Lead Implant Date: 20160810
Implantable Lead Implant Date: 20160810
Implantable Lead Location: 753859
Implantable Lead Location: 753860
Implantable Pulse Generator Implant Date: 20160810
Lead Channel Impedance Value: 290 Ohm
Lead Channel Impedance Value: 390 Ohm
Lead Channel Pacing Threshold Amplitude: 1 V
Lead Channel Pacing Threshold Amplitude: 1.25 V
Lead Channel Pacing Threshold Pulse Width: 0.5 ms
Lead Channel Pacing Threshold Pulse Width: 0.5 ms
Lead Channel Sensing Intrinsic Amplitude: 1.7 mV
Lead Channel Sensing Intrinsic Amplitude: 6.2 mV
Lead Channel Setting Pacing Amplitude: 2 V
Lead Channel Setting Pacing Amplitude: 2.5 V
Lead Channel Setting Pacing Pulse Width: 0.5 ms
Lead Channel Setting Sensing Sensitivity: 0.5 mV
Pulse Gen Serial Number: 7179751

## 2020-05-05 ENCOUNTER — Ambulatory Visit: Payer: Medicare Other | Admitting: Cardiology

## 2020-05-05 NOTE — Progress Notes (Signed)
Remote ICD transmission.   

## 2020-05-08 DIAGNOSIS — H353221 Exudative age-related macular degeneration, left eye, with active choroidal neovascularization: Secondary | ICD-10-CM | POA: Diagnosis not present

## 2020-05-12 ENCOUNTER — Ambulatory Visit (INDEPENDENT_AMBULATORY_CARE_PROVIDER_SITE_OTHER): Payer: Medicare Other

## 2020-05-12 DIAGNOSIS — I5022 Chronic systolic (congestive) heart failure: Secondary | ICD-10-CM | POA: Diagnosis not present

## 2020-05-12 DIAGNOSIS — Z95810 Presence of automatic (implantable) cardiac defibrillator: Secondary | ICD-10-CM | POA: Diagnosis not present

## 2020-05-19 DIAGNOSIS — I5022 Chronic systolic (congestive) heart failure: Secondary | ICD-10-CM | POA: Diagnosis not present

## 2020-05-19 DIAGNOSIS — I10 Essential (primary) hypertension: Secondary | ICD-10-CM | POA: Diagnosis not present

## 2020-05-19 DIAGNOSIS — G4700 Insomnia, unspecified: Secondary | ICD-10-CM | POA: Diagnosis not present

## 2020-05-20 NOTE — Progress Notes (Signed)
EPIC Encounter for ICM Monitoring  Patient Name: Clayton Morton is a 87 y.o. male Date: 05/20/2020 Primary Care Physican: Glenda Chroman, MD Primary Cardiologist: Domenic Polite Electrophysiologist: Allred Nephrologist: Theador Hawthorne 04/17/2020 OfficeWeight:163lbs  AT/AF Burden: <1%  Transmission reviewed.  CorVueThoracic impedancenormal.  Prescribed:  Furosemide20 mgtake1tablet (20 MGtotal)two times daily  Hydrochlorothiazide 50 mgTake 50 mg by mouth every morning & 100 mg in the evening  Labs: 12/15/2018 Creatinine 3.05, BUN 75, Potassium 4.0, Sodium 141. GFR 20-24 09/27/2018 Creatinine 3.25, BUN 64, Potassium 5.4, Sodium 144, GFR 17-19  Recommendations:No changes  Follow-up plan: ICM clinic phone appointment on4/06/2020. 91 day device clinic remote transmission5/12/2020.  EP/Cardiology Office Visits:05/23/2020 with Dr Rayann Heman.  Copy of ICM check sent to Dr.Allred  3 month ICM trend: 05/12/2020.    1 Year ICM trend:       Rosalene Billings, RN 05/20/2020 11:24 AM

## 2020-05-23 ENCOUNTER — Encounter: Payer: Self-pay | Admitting: Internal Medicine

## 2020-05-23 ENCOUNTER — Ambulatory Visit (INDEPENDENT_AMBULATORY_CARE_PROVIDER_SITE_OTHER): Payer: Medicare Other | Admitting: Internal Medicine

## 2020-05-23 VITALS — BP 148/60 | HR 58 | Ht 68.5 in | Wt 170.4 lb

## 2020-05-23 DIAGNOSIS — I2510 Atherosclerotic heart disease of native coronary artery without angina pectoris: Secondary | ICD-10-CM | POA: Diagnosis not present

## 2020-05-23 DIAGNOSIS — I5022 Chronic systolic (congestive) heart failure: Secondary | ICD-10-CM | POA: Diagnosis not present

## 2020-05-23 DIAGNOSIS — I255 Ischemic cardiomyopathy: Secondary | ICD-10-CM | POA: Diagnosis not present

## 2020-05-23 DIAGNOSIS — N184 Chronic kidney disease, stage 4 (severe): Secondary | ICD-10-CM | POA: Diagnosis not present

## 2020-05-23 NOTE — Progress Notes (Signed)
PCP: Glenda Chroman, MD Primary Cardiologist: Dr Domenic Polite Primary EP: Dr Rayann Heman  Clayton Morton is a 87 y.o. male who presents today for routine electrophysiology followup.  Since last being seen in our clinic, the patient reports doing very well.  Today, he denies symptoms of palpitations, chest pain, shortness of breath,  lower extremity edema, dizziness, presyncope, syncope, or ICD shocks.  The patient is otherwise without complaint today.   Past Medical History:  Diagnosis Date  . Age-related macular degeneration, wet, right eye ([place])   . AICD (automatic cardioverter/defibrillator) present 10/30/2014  . Cerebrovascular accident [facility]    Right subcortical infarct 2006  . Chronic systolic congestive heart failure ([place])    a) ECHO (02/2012): EF 30-35%, no obvious thrombus b) ECHO (11/2013): EF 10%, AV mildly calcified, mild MR, RV mod dialted and sys fx sev reduced, TAPSE 0.8, lateral annulus peak S velocity 5.4 c) RHC (11/29/13): RA 15, RV: 65/19, PA 49%, CO/CI: 3.25 / 1.7 d) RHC (12/03/13): RA 1, RV 25/1/2, PA 27/10 (16), PCWP 6, Fick CO/CI: 5.3 / 2.9, PVR 1.9 WU, PA sat 72 and 74%   . Coronary atherosclerosis of native coronary artery    1) CABG 2005 2) LHC (910/15): patent SVG to PDA, occlusion of SVG sequence to PLA, patency of LIMA to LAD and patency of SVG to diagonal with sev stenosis at coronary anastomotic site  . Diabetes mellitus, type 2 ([place])   . Essential hypertension   . History of gout   . Hyperlipidemia   . Ischemic cardiomyopathy   . Kidney stones   . Macular degeneration    Dr. [name]  . Non-ST elevation MI (NSTEMI) ([place])    2005  . Varicose veins    Past Surgical History:  Procedure Laterality Date  . CARDIAC CATHETERIZATION    . CARDIAC DEFIBRILLATOR PLACEMENT  10/30/2014  . CATARACT EXTRACTION W/ INTRAOCULAR LENS  IMPLANT, BILATERAL Bilateral ~ 1991  . CORONARY ANGIOPLASTY    . CORONARY ARTERY BYPASS GRAFT  2005   LIMA to LAD, SVG diagonal, SVG to PDA and PLA   . CYSTOSCOPY W/ LITHOLAPAXY / EHL    . EP IMPLANTABLE DEVICE N/A 10/30/2014   Procedure: ICD Implant;  Surgeon: Deboraha Sprang, MD, SJM Ellipse DR implanted for primary prevention  . LEFT AND RIGHT HEART CATHETERIZATION WITH CORONARY/GRAFT ANGIOGRAM N/A 11/29/2013   Procedure: LEFT AND RIGHT HEART CATHETERIZATION WITH Beatrix Fetters;  Surgeon: Blane Ohara, MD;  Location: [facility] CATH LAB;  Service: Cardiovascular;  Laterality: N/A;  . RIGHT HEART CATHETERIZATION N/A 12/03/2013   Procedure: RIGHT HEART CATH;  Surgeon: Jolaine Artist, MD;  Location: [facility] CATH LAB;  Service: Cardiovascular;  Laterality: N/A;  . TONSILLECTOMY      ROS- all systems are reviewed and negative except as per HPI above  Current Outpatient Medications  Medication Sig Dispense Refill  . allopurinol (ZYLOPRIM) 100 MG tablet Take 1 tablet by mouth daily.    [name] ALPRAZolam (XANAX) 0.25 MG tablet Take 0.5 tablets by mouth daily.  0  . aspirin 81 MG tablet Take 81 mg by mouth daily as needed.     [name] atorvastatin (LIPITOR) 80 MG tablet take 1 tablet by mouth once daily 90 tablet 3  . carvedilol (COREG) 12.5 MG tablet TAKE ONE TABLET BY MOUTH TWICE DAILY. TAKE WITH A MEAL. 180 tablet 2  . furosemide (LASIX) 20 MG tablet Take 20 mg by mouth 2 (two) times daily.    [name] glipiZIDE (GLUCOTROL  XL) 10 MG 24 hr tablet Take 10 mg by mouth 2 (two) times daily.    . hydrALAZINE (APRESOLINE) 50 MG tablet Take 1 tablet (50 mg total) by mouth 3 (three) times daily. 90 tablet 6  . hydrochlorothiazide (HYDRODIURIL) 50 MG tablet Take 50 mg by mouth every morning. & 100 mg in the evening    . isosorbide mononitrate (IMDUR) 60 MG 24 hr tablet TAKE ONE TABLET BY MOUTH DAILY 90 tablet 3  . Omega-3 Fatty Acids (FISH OIL) 1000 MG CAPS Take 1 capsule by mouth 2 (two) times daily.    . ONE TOUCH ULTRA TEST test strip 1 each by Other route 2 (two) times daily.   0  . repaglinide (PRANDIN) 1 MG tablet Take 1 mg by mouth daily.    [name] rOPINIRole  (REQUIP) 0.25 MG tablet Take 0.5 mg by mouth at bedtime.    [name] SYNTHROID 75 MCG tablet Take 1 tablet by mouth daily.     No current facility-administered medications for this visit.    Physical Exam: Vitals:   05/23/20 1202  BP: (!) 148/60  Pulse: (!) 58  SpO2: 99%  Weight: 170 lb 6.4 oz (77.3 kg)  Height: 5' 8.5" (1.74 m)    GEN- The patient is well appearing, alert and oriented x 3 today.   Head- normocephalic, atraumatic Eyes-  Sclera clear, conjunctiva pink Ears- hearing intact Oropharynx- clear Lungs-   normal work of breathing Chest- ICD pocket is well healed Heart- Regular rate and rhythm  GI- soft, NT, ND, + BS Extremities- no clubbing, cyanosis, or edema  ICD interrogation- reviewed in detail today,  See PACEART report    Wt Readings from Last 3 Encounters:  05/23/20 170 lb 6.4 oz (77.3 kg)  04/17/20 163 lb (73.9 kg)  02/01/20 164 lb (74.4 kg)    Assessment and Plan:  1.  Chronic systolic dysfunction/ CAD s/p CABG/ ischemic CM euvolemic today Stable on an appropriate medical regimen Normal ICD function See [facility] report No changes today he is not device dependant today followed in ICM device clinic We discussed that we could turn VT/VF therapies off should he decide for a more conservative palliative route going forward.  He wishes to keep therapies on for now.  This will be particularly more important to consider going forward as he has decided to not pursue dialysis.  2. HTN Stable No change required today  3. Stage IV-V renal failure He is not interested in dialysis and is no longer seeing nephrology Given his advanced age, this is very reasonable   Risks, benefits and potential toxicities for medications prescribed and/or refilled reviewed with patient today.   Return in a year  Thompson Grayer MD, [facility] 05/23/2020 12:15 PM

## 2020-05-23 NOTE — Patient Instructions (Signed)
Medication Instructions:  Continue all current medications.  Labwork: none  Testing/Procedures: none  Follow-Up: 1 year   Any Other Special Instructions Will Be Listed Below (If Applicable).  If you need a refill on your cardiac medications before your next appointment, please call your pharmacy.  

## 2020-06-04 LAB — CUP PACEART INCLINIC DEVICE CHECK
Battery Remaining Longevity: 37 mo
Brady Statistic RA Percent Paced: 14 %
Brady Statistic RV Percent Paced: 7.9 %
Date Time Interrogation Session: 20220304120000
HighPow Impedance: 60.75 Ohm
Implantable Lead Implant Date: 20160810
Implantable Lead Implant Date: 20160810
Implantable Lead Location: 753859
Implantable Lead Location: 753860
Implantable Pulse Generator Implant Date: 20160810
Lead Channel Impedance Value: 312.5 Ohm
Lead Channel Impedance Value: 400 Ohm
Lead Channel Pacing Threshold Amplitude: 1 V
Lead Channel Pacing Threshold Amplitude: 1.625 V
Lead Channel Pacing Threshold Pulse Width: 0.5 ms
Lead Channel Pacing Threshold Pulse Width: 0.5 ms
Lead Channel Sensing Intrinsic Amplitude: 1.7 mV
Lead Channel Sensing Intrinsic Amplitude: 5.1 mV
Lead Channel Setting Pacing Amplitude: 1.875
Lead Channel Setting Pacing Amplitude: 2 V
Lead Channel Setting Pacing Pulse Width: 0.5 ms
Lead Channel Setting Sensing Sensitivity: 0.5 mV
Pulse Gen Serial Number: 7179751

## 2020-06-18 DIAGNOSIS — I10 Essential (primary) hypertension: Secondary | ICD-10-CM | POA: Diagnosis not present

## 2020-06-18 DIAGNOSIS — I5022 Chronic systolic (congestive) heart failure: Secondary | ICD-10-CM | POA: Diagnosis not present

## 2020-06-18 DIAGNOSIS — G4700 Insomnia, unspecified: Secondary | ICD-10-CM | POA: Diagnosis not present

## 2020-06-23 ENCOUNTER — Ambulatory Visit (INDEPENDENT_AMBULATORY_CARE_PROVIDER_SITE_OTHER): Payer: Medicare Other

## 2020-06-23 DIAGNOSIS — Z299 Encounter for prophylactic measures, unspecified: Secondary | ICD-10-CM | POA: Diagnosis not present

## 2020-06-23 DIAGNOSIS — Z95810 Presence of automatic (implantable) cardiac defibrillator: Secondary | ICD-10-CM | POA: Diagnosis not present

## 2020-06-23 DIAGNOSIS — F419 Anxiety disorder, unspecified: Secondary | ICD-10-CM | POA: Diagnosis not present

## 2020-06-23 DIAGNOSIS — I5022 Chronic systolic (congestive) heart failure: Secondary | ICD-10-CM

## 2020-06-23 DIAGNOSIS — I10 Essential (primary) hypertension: Secondary | ICD-10-CM | POA: Diagnosis not present

## 2020-06-23 DIAGNOSIS — E039 Hypothyroidism, unspecified: Secondary | ICD-10-CM | POA: Diagnosis not present

## 2020-06-23 DIAGNOSIS — I509 Heart failure, unspecified: Secondary | ICD-10-CM | POA: Diagnosis not present

## 2020-06-23 DIAGNOSIS — E1165 Type 2 diabetes mellitus with hyperglycemia: Secondary | ICD-10-CM | POA: Diagnosis not present

## 2020-06-24 NOTE — Progress Notes (Signed)
EPIC Encounter for ICM Monitoring  Patient Name: Clayton Morton is a 87 y.o. male Date: 06/24/2020 Primary Care Physican: Glenda Chroman, MD Primary Cardiologist: Domenic Polite Electrophysiologist: Allred 05/23/2020 OfficeWeight:170lbs  AT/AF Burden: 0%  Transmission reviewed.  CorVueThoracic impedancenormal.  Prescribed:  Furosemide20 mgtake1tablet (20 MGtotal)two times daily  Hydrochlorothiazide 50 mgTake 50 mg by mouth every morning & 100 mg in the evening  Labs: 12/15/2018 Creatinine 3.05, BUN 75, Potassium 4.0, Sodium 141. GFR 20-24 09/27/2018 Creatinine 3.25, BUN 64, Potassium 5.4, Sodium 144, GFR 17-19  Recommendations:No changes  Follow-up plan: ICM clinic phone appointment on5/07/2020. 91 day device clinic remote transmission5/12/2020.  EP/Cardiology Office Visits:05/23/2020 with Dr Rayann Heman.  Copy of ICM check sent to Dr.Allred  3 month ICM trend: 06/23/2020.    1 Year ICM trend:       Rosalene Billings, RN 06/24/2020 8:32 AM

## 2020-07-04 DIAGNOSIS — H353221 Exudative age-related macular degeneration, left eye, with active choroidal neovascularization: Secondary | ICD-10-CM | POA: Diagnosis not present

## 2020-07-04 DIAGNOSIS — H353213 Exudative age-related macular degeneration, right eye, with inactive scar: Secondary | ICD-10-CM | POA: Diagnosis not present

## 2020-07-24 ENCOUNTER — Ambulatory Visit (INDEPENDENT_AMBULATORY_CARE_PROVIDER_SITE_OTHER): Payer: Medicare Other

## 2020-07-24 DIAGNOSIS — Z95810 Presence of automatic (implantable) cardiac defibrillator: Secondary | ICD-10-CM | POA: Diagnosis not present

## 2020-07-24 DIAGNOSIS — I5022 Chronic systolic (congestive) heart failure: Secondary | ICD-10-CM | POA: Diagnosis not present

## 2020-07-25 NOTE — Progress Notes (Signed)
EPIC Encounter for ICM Monitoring  Patient Name: Clayton Morton is a 87 y.o. male Date: 07/25/2020 Primary Care Physican: Glenda Chroman, MD Primary Cardiologist: Domenic Polite Electrophysiologist: Allred 3/4/2022OfficeWeight:170lbs  AT/AF Burden: 0%  Transmission reviewed.  CorVueThoracic impedancenormal.  Prescribed:  Furosemide20 mgtake1tablet (20 MGtotal)two times daily  Hydrochlorothiazide 50 mgTake 50 mg by mouth every morning & 100 mg in the evening  Labs: 12/15/2018 Creatinine 3.05, BUN 75, Potassium 4.0, Sodium 141. GFR 20-24 09/27/2018 Creatinine 3.25, BUN 64, Potassium 5.4, Sodium 144, GFR 17-19  Recommendations:No changes  Follow-up plan: ICM clinic phone appointment on6/13/2022. 91 day device clinic remote transmission5/12/2020.  EP/Cardiology Office Visits:05/22/2021 with Dr Rayann Heman.  Copy of ICM check sent to Dr.Allred  3 month ICM trend: 07/24/2020.    1 Year ICM trend:       Rosalene Billings, RN 07/25/2020 3:02 PM

## 2020-07-28 ENCOUNTER — Telehealth: Payer: Self-pay | Admitting: Cardiology

## 2020-07-28 DIAGNOSIS — N184 Chronic kidney disease, stage 4 (severe): Secondary | ICD-10-CM

## 2020-07-28 DIAGNOSIS — I10 Essential (primary) hypertension: Secondary | ICD-10-CM

## 2020-07-28 NOTE — Telephone Encounter (Signed)
Caregiver called back requesting to check with [name] to see if he needs to have any labs done.

## 2020-07-28 NOTE — Telephone Encounter (Signed)
Thank you for clarifying.  If he is feeling weaker on Lasix 20 mg twice daily, would agree with cutting it back to 10 mg once a day.  If his weight does go up by 2 pounds however in 24 hours he should double the dose.

## 2020-07-28 NOTE — Telephone Encounter (Signed)
Spoke with caretaker and informed of Dr. Myles Gip note. She stated that pt does not have any fluid and when he take lasix a prescribed he becomes weak. She states that is the reason he has only been taking 1/2 tablet daily as needed. Reiterated that patient is to be taking 20 mg of lasix two times daily. Caregiver states that he becomes weak and does not want him to take the lasix as prescribed. She reports that weight has been stable between 159-161 lbs. Please advise.

## 2020-07-28 NOTE — Telephone Encounter (Signed)
Pt c/o Shortness Of Breath: STAT if SOB developed within the last 24 hours or pt is noticeably SOB on the phone  1. Are you currently SOB (can you hear that pt is SOB on the phone)? Speaking with caregiver   2. How long have you been experiencing SOB?  Off & On for the past 2 weeks. States that each day it is becoming worse.   3. Are you SOB when sitting or when up moving around?  Mostly moving around but if he starts talking he gets SOB.   4. Are you currently experiencing any other symptoms? Headache  Caregiver states that he is having fainting spells (she is calling it black out spells but not actually completely blacking out)  BP 143/71 (7PM) 5/8   Please call [phone]

## 2020-07-28 NOTE — Telephone Encounter (Signed)
Recent device check showed normal thoracic impedance.  Per chart Lasix is to be 20 mg twice daily.  If he took an extra dose today, would reassess weight tomorrow and see if he needs another dose.

## 2020-07-28 NOTE — Telephone Encounter (Signed)
Spoke with pt who states that he has SOB on exertion for the last 2 weeks. Pt denies swelling and chest pain at this time. His current weight is 161 LBS and on Saturday he weighed 159 lbs. Pt takes Lasix 10 mg as needed, and took today. No other complaints at this time. Please advise.

## 2020-07-29 ENCOUNTER — Ambulatory Visit (INDEPENDENT_AMBULATORY_CARE_PROVIDER_SITE_OTHER): Payer: Medicare Other

## 2020-07-29 DIAGNOSIS — I255 Ischemic cardiomyopathy: Secondary | ICD-10-CM

## 2020-07-29 LAB — CUP PACEART REMOTE DEVICE CHECK
Battery Remaining Longevity: 33 mo
Battery Remaining Percentage: 34 %
Battery Voltage: 2.86 V
Brady Statistic AP VP Percent: 1 %
Brady Statistic AP VS Percent: 9.8 %
Brady Statistic AS VP Percent: 1 %
Brady Statistic AS VS Percent: 88 %
Brady Statistic RA Percent Paced: 8.1 %
Brady Statistic RV Percent Paced: 1 %
Date Time Interrogation Session: 20220510020017
HighPow Impedance: 50 Ohm
HighPow Impedance: 50 Ohm
Implantable Lead Implant Date: 20160810
Implantable Lead Implant Date: 20160810
Implantable Lead Location: 753859
Implantable Lead Location: 753860
Implantable Pulse Generator Implant Date: 20160810
Lead Channel Impedance Value: 280 Ohm
Lead Channel Impedance Value: 380 Ohm
Lead Channel Pacing Threshold Amplitude: 1 V
Lead Channel Pacing Threshold Amplitude: 1.375 V
Lead Channel Pacing Threshold Pulse Width: 0.5 ms
Lead Channel Pacing Threshold Pulse Width: 0.5 ms
Lead Channel Sensing Intrinsic Amplitude: 1.4 mV
Lead Channel Sensing Intrinsic Amplitude: 7.5 mV
Lead Channel Setting Pacing Amplitude: 1.625
Lead Channel Setting Pacing Amplitude: 2 V
Lead Channel Setting Pacing Pulse Width: 0.5 ms
Lead Channel Setting Sensing Sensitivity: 0.5 mV
Pulse Gen Serial Number: 7179751

## 2020-07-29 NOTE — Telephone Encounter (Signed)
Caregiver requesting that kidney function labs be completed before next visit. Please advise.

## 2020-07-29 NOTE — Telephone Encounter (Signed)
As per last office note, he is no longer following with nephrologist.  If he has not had interval BMET with Dr. Woody Seller, this can be obtained just prior to his next visit.

## 2020-07-29 NOTE — Telephone Encounter (Signed)
Pt has not had BMET. Will order and notify pt and caregiver. Pt notified.

## 2020-08-01 ENCOUNTER — Other Ambulatory Visit (HOSPITAL_COMMUNITY)
Admission: RE | Admit: 2020-08-01 | Discharge: 2020-08-01 | Disposition: A | Discharge status: Home / Self Care | Payer: Medicare Other | Source: Ambulatory Visit | Attending: Cardiology | Admitting: Cardiology

## 2020-08-01 DIAGNOSIS — N184 Chronic kidney disease, stage 4 (severe): Secondary | ICD-10-CM | POA: Diagnosis not present

## 2020-08-01 LAB — BASIC METABOLIC PANEL
Anion gap: 8 (ref 5–15)
BUN: 54 mg/dL — ABNORMAL HIGH (ref 8–23)
CO2: 25 mmol/L (ref 22–32)
Calcium: 8.5 mg/dL — ABNORMAL LOW (ref 8.9–10.3)
Chloride: 109 mmol/L (ref 98–111)
Creatinine, Ser: 3.17 mg/dL — ABNORMAL HIGH (ref 0.61–1.24)
GFR, Estimated: 18 mL/min — ABNORMAL LOW (ref >60)
Glucose, Bld: 198 mg/dL — ABNORMAL HIGH (ref 70–99)
Potassium: 3.6 mmol/L (ref 3.5–5.1)
Sodium: 142 mmol/L (ref 135–145)

## 2020-08-04 NOTE — Progress Notes (Signed)
Cardiology Office Note  Date: 08/05/2020   ID: Clayton Morton, DOB 03-26-1932, MRN [id number]  PCP:  Glenda Chroman, MD  Cardiologist:  Rozann Lesches, MD Electrophysiologist:  Thompson Grayer, MD   Chief Complaint  Patient presents with  . Cardiac follow-up    History of Present Illness: Clayton Morton is an 87 y.o. male last seen in January.  He is here today with home assistant for a follow-up visit.  He has been gradually declining in terms of overall stamina, shortness of breath.  He does not describe any angina symptoms however, no palpitations or syncope.  We did adjust his diuretics, he has been more consistent with use and his weight is down since March.  Unfortunately, he has had progressive renal insufficiency, creatinine up to 3.17.  He is no longer following with nephrology and is not interested in pursuing hemodialysis.  He follows with [name], St. Jude ICD in place.  Recent thoracic impedance was normal.  No device shocks.  I reviewed the remainder of his medications which are noted below.   Past Medical History:  Diagnosis Date  . Age-related macular degeneration, wet, right eye ([place])   . AICD (automatic cardioverter/defibrillator) present 10/30/2014  . Cerebrovascular accident [facility])    Right subcortical infarct 2006  . Chronic systolic congestive heart failure ([place])    a) ECHO (02/2012): EF 30-35%, no obvious thrombus b) ECHO (11/2013): EF 10%, AV mildly calcified, mild MR, RV mod dialted and sys fx sev reduced, TAPSE 0.8, lateral annulus peak S velocity 5.4 c) RHC (11/29/13): RA 15, RV: 65/19, PA 49%, CO/CI: 3.25 / 1.7 d) RHC (12/03/13): RA 1, RV 25/1/2, PA 27/10 (16), PCWP 6, Fick CO/CI: 5.3 / 2.9, PVR 1.9 WU, PA sat 72 and 74%   . Coronary atherosclerosis of native coronary artery    1) CABG 2005 2) LHC (910/15): patent SVG to PDA, occlusion of SVG sequence to PLA, patency of LIMA to LAD and patency of SVG to diagonal with sev stenosis at coronary anastomotic site   . Diabetes mellitus, type 2 ([place])   . Essential hypertension   . History of gout   . Hyperlipidemia   . Ischemic cardiomyopathy   . Kidney stones   . Macular degeneration    Dr. [name]  . Non-ST elevation MI (NSTEMI) ([place])    2005  . Varicose veins     Past Surgical History:  Procedure Laterality Date  . CARDIAC CATHETERIZATION    . CARDIAC DEFIBRILLATOR PLACEMENT  10/30/2014  . CATARACT EXTRACTION W/ INTRAOCULAR LENS  IMPLANT, BILATERAL Bilateral ~ 1991  . CORONARY ANGIOPLASTY    . CORONARY ARTERY BYPASS GRAFT  2005   LIMA to LAD, SVG diagonal, SVG to PDA and PLA  . CYSTOSCOPY W/ LITHOLAPAXY / EHL    . EP IMPLANTABLE DEVICE N/A 10/30/2014   Procedure: ICD Implant;  Surgeon: Deboraha Sprang, MD, SJM Ellipse DR implanted for primary prevention  . LEFT AND RIGHT HEART CATHETERIZATION WITH CORONARY/GRAFT ANGIOGRAM N/A 11/29/2013   Procedure: LEFT AND RIGHT HEART CATHETERIZATION WITH Beatrix Fetters;  Surgeon: Blane Ohara, MD;  Location: [facility] C[facility] LAB;  Service: Cardiovascular;  Laterality: N/A;  . RIGHT HEART CATHETERIZATION N/A 12/03/2013   Procedure: RIGHT HEART CATH;  Surgeon: Jolaine Artist, MD;  Location: [facility] CATH LAB;  Service: Cardiovascular;  Laterality: N/A;  . TONSILLECTOMY      Current Outpatient Medications  Medication Sig Dispense Refill  . allopurinol (ZYLOPRIM) 100 MG tablet  Take 1 tablet by mouth daily.    [name] ALPRAZolam (XANAX) 0.25 MG tablet Take 0.5 tablets by mouth daily.  0  . aspirin 81 MG tablet Take 81 mg by mouth daily as needed.     [name] atorvastatin (LIPITOR) 80 MG tablet take 1 tablet by mouth once daily 90 tablet 3  . carvedilol (COREG) 12.5 MG tablet TAKE ONE TABLET BY MOUTH TWICE DAILY. TAKE WITH A MEAL. 180 tablet 2  . furosemide (LASIX) 20 MG tablet Take 20 mg by mouth 2 (two) times daily.    [name] glipiZIDE (GLUCOTROL XL) 10 MG 24 hr tablet Take 10 mg by mouth 2 (two) times daily.    . hydrALAZINE (APRESOLINE) 50 MG tablet Take 1 tablet (50 mg  total) by mouth 3 (three) times daily. 90 tablet 6  . hydrochlorothiazide (HYDRODIURIL) 50 MG tablet Take 50 mg by mouth every morning. & 100 mg in the evening    . isosorbide mononitrate (IMDUR) 60 MG 24 hr tablet TAKE ONE TABLET BY MOUTH DAILY 90 tablet 3  . Omega-3 Fatty Acids (FISH OIL) 1000 MG CAPS Take 1 capsule by mouth 2 (two) times daily.    . ONE TOUCH ULTRA TEST test strip 1 each by Other route 2 (two) times daily.   0  . repaglinide (PRANDIN) 1 MG tablet Take 1 mg by mouth daily.    [name] rOPINIRole (REQUIP) 0.25 MG tablet Take 0.5 mg by mouth at bedtime.    [name] SYNTHROID 75 MCG tablet Take 1 tablet by mouth daily.     No current facility-administered medications for this visit.   Allergies:  Tetracycline   ROS: No orthopnea or PND.  No leg swelling.  Physical Exam: VS:  BP 124/64   Pulse 65   Ht 5\' 8"  (1.727 m)   Wt 161 lb (73 kg)   SpO2 99%   BMI 24.48 kg/m , BMI Body mass index is 24.48 kg/m.  Wt Readings from Last 3 Encounters:  08/05/20 161 lb (73 kg)  05/23/20 170 lb 6.4 oz (77.3 kg)  04/17/20 163 lb (73.9 kg)    General: Elderly male, in wheelchair. HEENT: Conjunctiva and lids normal, wearing a mask. Neck: Supple, no elevated JVP or carotid bruits, no thyromegaly. Lungs: Clear to auscultation, nonlabored breathing at rest. Cardiac: Regular rate and rhythm, no S3, 2/6 systolic murmur, no pericardial rub. Skin: Scattered ecchymoses. Extremities: No pitting edema.  ECG:  An ECG dated 02/01/2020 was personally reviewed today and demonstrated:  Atrial paced rhythm with right bundle branch block and left anterior fascicular block.  Recent Labwork: 08/01/2020: BUN 54; Creatinine, Ser 3.17; Potassium 3.6; Sodium 142   Other Studies Reviewed Today:  Echocardiogram 11/29/2018: 1. Stage 1: N/A: Basal and mid inferior wall is abnormal.  2. The left ventricle has severely reduced systolic function, with an  ejection fraction of 25-30%. The cavity size was mildly  dilated. There is  severe concentric left ventricular hypertrophy. Left ventricular diastolic  Doppler parameters are consistent  with impaired relaxation. Left ventricular diffuse hypokinesis.  3. The right ventricle has moderately reduced systolic function. The  cavity was normal. There is mildly increased right ventricular wall  thickness.  4. The aortic valve is tricuspid. Moderate thickening of the aortic  valve. Mild calcification of the aortic valve. Aortic valve regurgitation  is mild by color flow Doppler. Mild-moderate stenosis of the aortic valve.  Moderate aortic annular  calcification noted.  5. The mitral valve is degenerative. Moderate thickening of  the mitral  valve leaflet. There is mild to moderate mitral annular calcification  present.  6. The tricuspid valve is grossly normal.  7. The aorta is normal unless otherwise noted.  8. The interatrial septum was not well visualized.   Assessment and Plan:  1.  Ischemic cardiomyopathy with chronic systolic heart failure.  We are managing him conservatively at this point, currently tolerating present regimen although with progressive weakness which is likely multifactorial.  LVEF 25 to 30% by last assessment, also moderate RV dysfunction.  Fortunately, fluid status looks reasonable on present diuretic regimen and his weight is stable.  He has progressive renal failure, creatinine 3.17.  Continue aspirin, Coreg, Lasix, Imdur, hydralazine, and Lipitor.  I did talk with him about considering palliative care consultation.  2.  CKD stage IV-V creatinine 3.17.  He is no longer following with nephrology and is not interested in hemodialysis.  3.  CAD status post CABG with documented graft disease.  No definite angina symptoms on current therapy.  4.  St. Jude ICD in place, followed by [name].  Recent thoracic impedance normal.  No device shocks or syncope.  Therapies are still active.  Medication Adjustments/Labs and Tests  Ordered: Current medicines are reviewed at length with the patient today.  Concerns regarding medicines are outlined above.   Tests Ordered: No orders of the defined types were placed in this encounter.   Medication Changes: No orders of the defined types were placed in this encounter.   Disposition:  Follow up 3 months.  Signed, Satira Sark, MD, [facility] 08/05/2020 4:01 PM    [facility] at [address], [place], [place] [address] Phone: [phone]; Fax: [phone]

## 2020-08-05 ENCOUNTER — Encounter: Payer: Self-pay | Admitting: Cardiology

## 2020-08-05 ENCOUNTER — Ambulatory Visit (INDEPENDENT_AMBULATORY_CARE_PROVIDER_SITE_OTHER): Payer: Medicare Other | Admitting: Cardiology

## 2020-08-05 VITALS — BP 124/64 | HR 65 | Ht 68.0 in | Wt 161.0 lb

## 2020-08-05 DIAGNOSIS — N184 Chronic kidney disease, stage 4 (severe): Secondary | ICD-10-CM

## 2020-08-05 DIAGNOSIS — I255 Ischemic cardiomyopathy: Secondary | ICD-10-CM

## 2020-08-05 DIAGNOSIS — I5022 Chronic systolic (congestive) heart failure: Secondary | ICD-10-CM

## 2020-08-05 DIAGNOSIS — I25119 Atherosclerotic heart disease of native coronary artery with unspecified angina pectoris: Secondary | ICD-10-CM | POA: Diagnosis not present

## 2020-08-05 NOTE — Patient Instructions (Addendum)
Medication Instructions:   Your physician recommends that you continue on your current medications as directed. Please refer to the Current Medication list given to you today.  Labwork:  none  Testing/Procedures:  none  Follow-Up:  Your physician recommends that you schedule a follow-up appointment in: 3 months.  Any Other Special Instructions Will Be Listed Below (If Applicable).  If you need a refill on your cardiac medications before your next appointment, please call your pharmacy. 

## 2020-08-12 ENCOUNTER — Telehealth: Payer: Self-pay | Admitting: Emergency Medicine

## 2020-08-12 NOTE — Telephone Encounter (Addendum)
Contacted patient due to [facility] alert received on 08/12/20 for 1 VT (monitor event) duration 16 seconds per alert and 3 NSVT episodes. Patient states that he has not had any symptoms or chest pain. Patient does state that he has had a headache but states that he has been having blood pressure issues and contributes the headache to blood pressure. Patient states compliance with all medications and recently had a visit with his cardiologist. I advised patient that I would send transmission report to Dr. Caryl Comes for review and that we will continue to monitor for any new alerts.  Patient currently is established [name]. Routing to [name] for review.

## 2020-08-13 DIAGNOSIS — Z789 Other specified health status: Secondary | ICD-10-CM | POA: Diagnosis not present

## 2020-08-13 DIAGNOSIS — Z7189 Other specified counseling: Secondary | ICD-10-CM | POA: Diagnosis not present

## 2020-08-13 DIAGNOSIS — E7800 Pure hypercholesterolemia, unspecified: Secondary | ICD-10-CM | POA: Diagnosis not present

## 2020-08-13 DIAGNOSIS — Z299 Encounter for prophylactic measures, unspecified: Secondary | ICD-10-CM | POA: Diagnosis not present

## 2020-08-13 DIAGNOSIS — E039 Hypothyroidism, unspecified: Secondary | ICD-10-CM | POA: Diagnosis not present

## 2020-08-13 DIAGNOSIS — I10 Essential (primary) hypertension: Secondary | ICD-10-CM | POA: Diagnosis not present

## 2020-08-13 DIAGNOSIS — Z1339 Encounter for screening examination for other mental health and behavioral disorders: Secondary | ICD-10-CM | POA: Diagnosis not present

## 2020-08-13 DIAGNOSIS — Z1331 Encounter for screening for depression: Secondary | ICD-10-CM | POA: Diagnosis not present

## 2020-08-13 DIAGNOSIS — Z79899 Other long term (current) drug therapy: Secondary | ICD-10-CM | POA: Diagnosis not present

## 2020-08-13 DIAGNOSIS — Z0000 Encounter for general adult medical examination without abnormal findings: Secondary | ICD-10-CM | POA: Diagnosis not present

## 2020-08-13 DIAGNOSIS — R5383 Other fatigue: Secondary | ICD-10-CM | POA: Diagnosis not present

## 2020-08-13 DIAGNOSIS — Z125 Encounter for screening for malignant neoplasm of prostate: Secondary | ICD-10-CM | POA: Diagnosis not present

## 2020-08-13 DIAGNOSIS — I509 Heart failure, unspecified: Secondary | ICD-10-CM | POA: Diagnosis not present

## 2020-08-13 DIAGNOSIS — G319 Degenerative disease of nervous system, unspecified: Secondary | ICD-10-CM | POA: Diagnosis not present

## 2020-08-13 DIAGNOSIS — Z6827 Body mass index (BMI) 27.0-27.9, adult: Secondary | ICD-10-CM | POA: Diagnosis not present

## 2020-08-20 NOTE — Progress Notes (Signed)
Remote ICD transmission.   

## 2020-08-29 DIAGNOSIS — Z961 Presence of intraocular lens: Secondary | ICD-10-CM | POA: Diagnosis not present

## 2020-08-29 DIAGNOSIS — H353231 Exudative age-related macular degeneration, bilateral, with active choroidal neovascularization: Secondary | ICD-10-CM | POA: Diagnosis not present

## 2020-08-29 DIAGNOSIS — Z79899 Other long term (current) drug therapy: Secondary | ICD-10-CM | POA: Diagnosis not present

## 2020-08-29 DIAGNOSIS — H353213 Exudative age-related macular degeneration, right eye, with inactive scar: Secondary | ICD-10-CM | POA: Diagnosis not present

## 2020-08-29 DIAGNOSIS — H353221 Exudative age-related macular degeneration, left eye, with active choroidal neovascularization: Secondary | ICD-10-CM | POA: Diagnosis not present

## 2020-09-01 ENCOUNTER — Ambulatory Visit (INDEPENDENT_AMBULATORY_CARE_PROVIDER_SITE_OTHER): Payer: Medicare Other

## 2020-09-01 DIAGNOSIS — Z95810 Presence of automatic (implantable) cardiac defibrillator: Secondary | ICD-10-CM

## 2020-09-01 DIAGNOSIS — I5022 Chronic systolic (congestive) heart failure: Secondary | ICD-10-CM

## 2020-09-03 NOTE — Progress Notes (Signed)
EPIC Encounter for ICM Monitoring  Patient Name: Clayton Morton is a 87 y.o. male Date: 09/03/2020 Primary Care Physican: Glenda Chroman, MD Primary Cardiologist: Domenic Polite Electrophysiologist: Allred 08/05/2020 Office Weight: 161 lbs   AT/AF Burden: 0%   Transmission reviewed.     CorVue Thoracic impedance normal.   Prescribed:  Furosemide 20 mg take 1 tablet  (20 MG total) two times daily Hydrochlorothiazide 50 mg Take 50 mg by mouth every morning & 100 mg in the evening   Labs: 08/01/2020 Creatinine 3.17, BUN 54, Potassium 3.6, Sodium 142, GFR 18 A complete set of results can be found in Results Review.   Recommendations: No changes   Follow-up plan: ICM clinic phone appointment on 10/06/2020.   91 day device clinic remote transmission 10/28/2020.     EP/Cardiology Office Visits: 11/11/2020 with Dr Domenic Polite.   05/22/2021 with Dr Rayann Heman.    Copy of ICM check sent to [name]  3 month ICM trend: 09/01/2020.    1 Year ICM trend:       Rosalene Billings, RN 09/03/2020 8:47 AM

## 2020-09-11 ENCOUNTER — Telehealth: Payer: Self-pay | Admitting: Emergency Medicine

## 2020-09-11 NOTE — Telephone Encounter (Signed)
[facility] Alert received for 3 NSVT episodes and one V-Tach episode, duration 18 seconds on 09/10/20. Called patient to assess patient. Patient states that he feels fine today. Yesterday he states that he felt fatigued and like he was going to pass out. He denies passing out, any chest pain,or shortness of breath. Patient non-symptomatic today. Routing to [name] for review.

## 2020-09-18 DIAGNOSIS — G4700 Insomnia, unspecified: Secondary | ICD-10-CM | POA: Diagnosis not present

## 2020-09-18 DIAGNOSIS — M797 Fibromyalgia: Secondary | ICD-10-CM | POA: Diagnosis not present

## 2020-10-02 DIAGNOSIS — I5022 Chronic systolic (congestive) heart failure: Secondary | ICD-10-CM | POA: Diagnosis not present

## 2020-10-02 DIAGNOSIS — I10 Essential (primary) hypertension: Secondary | ICD-10-CM | POA: Diagnosis not present

## 2020-10-02 DIAGNOSIS — Z299 Encounter for prophylactic measures, unspecified: Secondary | ICD-10-CM | POA: Diagnosis not present

## 2020-10-02 DIAGNOSIS — G4700 Insomnia, unspecified: Secondary | ICD-10-CM | POA: Diagnosis not present

## 2020-10-02 DIAGNOSIS — E1165 Type 2 diabetes mellitus with hyperglycemia: Secondary | ICD-10-CM | POA: Diagnosis not present

## 2020-10-06 ENCOUNTER — Ambulatory Visit (INDEPENDENT_AMBULATORY_CARE_PROVIDER_SITE_OTHER): Payer: Medicare Other

## 2020-10-06 DIAGNOSIS — I5022 Chronic systolic (congestive) heart failure: Secondary | ICD-10-CM

## 2020-10-06 DIAGNOSIS — Z95810 Presence of automatic (implantable) cardiac defibrillator: Secondary | ICD-10-CM | POA: Diagnosis not present

## 2020-10-08 NOTE — Progress Notes (Signed)
EPIC Encounter for ICM Monitoring  Patient Name: Clayton Morton is a 87 y.o. male Date: 10/08/2020 Primary Care Physican: Glenda Chroman, MD Primary Cardiologist: Domenic Polite Electrophysiologist: Allred 08/05/2020 Office Weight: 161 lbs   AT/AF Burden: 0%   Transmission reviewed.     CorVue Thoracic impedance normal  but was suggesting possible fluid accumulation starting 6/15-7/11.   Prescribed:  Furosemide 20 mg take 1 tablet  (20 MG total) two times daily Hydrochlorothiazide 50 mg Take 50 mg by mouth every morning & 100 mg in the evening   Labs: 08/01/2020 Creatinine 3.17, BUN 54, Potassium 3.6, Sodium 142, GFR 18 A complete set of results can be found in Results Review.   Recommendations: No changes   Follow-up plan: ICM clinic phone appointment on 11/10/2020.   91 day device clinic remote transmission 10/28/2020.     EP/Cardiology Office Visits: 11/11/2020 with Dr Domenic Polite.   05/22/2021 with Dr Rayann Heman.    Copy of ICM check sent to [name].   3 month ICM trend: 10/05/2020.    1 Year ICM trend:       Rosalene Billings, RN 10/08/2020 3:46 PM

## 2020-10-10 ENCOUNTER — Other Ambulatory Visit: Payer: Self-pay | Admitting: Cardiology

## 2020-10-10 DIAGNOSIS — I509 Heart failure, unspecified: Secondary | ICD-10-CM

## 2020-10-13 ENCOUNTER — Other Ambulatory Visit: Payer: Self-pay | Admitting: Cardiology

## 2020-10-24 DIAGNOSIS — H353231 Exudative age-related macular degeneration, bilateral, with active choroidal neovascularization: Secondary | ICD-10-CM | POA: Diagnosis not present

## 2020-10-24 DIAGNOSIS — H353221 Exudative age-related macular degeneration, left eye, with active choroidal neovascularization: Secondary | ICD-10-CM | POA: Diagnosis not present

## 2020-10-28 ENCOUNTER — Ambulatory Visit (INDEPENDENT_AMBULATORY_CARE_PROVIDER_SITE_OTHER): Payer: Medicare Other

## 2020-10-28 DIAGNOSIS — I255 Ischemic cardiomyopathy: Secondary | ICD-10-CM | POA: Diagnosis not present

## 2020-10-28 LAB — CUP PACEART REMOTE DEVICE CHECK
Battery Remaining Longevity: 31 mo
Battery Remaining Percentage: 31 %
Battery Voltage: 2.84 V
Brady Statistic AP VP Percent: 1 %
Brady Statistic AP VS Percent: 11 %
Brady Statistic AS VP Percent: 1 %
Brady Statistic AS VS Percent: 85 %
Brady Statistic RA Percent Paced: 8.6 %
Brady Statistic RV Percent Paced: 1.1 %
Date Time Interrogation Session: 20220809020015
HighPow Impedance: 50 Ohm
HighPow Impedance: 50 Ohm
Implantable Lead Implant Date: 20160810
Implantable Lead Implant Date: 20160810
Implantable Lead Location: 753859
Implantable Lead Location: 753860
Implantable Pulse Generator Implant Date: 20160810
Lead Channel Impedance Value: 280 Ohm
Lead Channel Impedance Value: 380 Ohm
Lead Channel Pacing Threshold Amplitude: 1 V
Lead Channel Pacing Threshold Amplitude: 1.25 V
Lead Channel Pacing Threshold Pulse Width: 0.5 ms
Lead Channel Pacing Threshold Pulse Width: 0.5 ms
Lead Channel Sensing Intrinsic Amplitude: 1.4 mV
Lead Channel Sensing Intrinsic Amplitude: 6.6 mV
Lead Channel Setting Pacing Amplitude: 1.5 V
Lead Channel Setting Pacing Amplitude: 2 V
Lead Channel Setting Pacing Pulse Width: 0.5 ms
Lead Channel Setting Sensing Sensitivity: 0.5 mV
Pulse Gen Serial Number: 7179751

## 2020-11-10 ENCOUNTER — Ambulatory Visit (INDEPENDENT_AMBULATORY_CARE_PROVIDER_SITE_OTHER): Payer: Medicare Other

## 2020-11-10 DIAGNOSIS — I5022 Chronic systolic (congestive) heart failure: Secondary | ICD-10-CM

## 2020-11-10 DIAGNOSIS — Z95810 Presence of automatic (implantable) cardiac defibrillator: Secondary | ICD-10-CM

## 2020-11-10 NOTE — Progress Notes (Addendum)
EPIC Encounter for ICM Monitoring  Patient Name: Clayton Morton is a 87 y.o. male Date: 11/10/2020 Primary Care Physican: Glenda Chroman, MD Primary Cardiologist: Domenic Polite Electrophysiologist: Allred 08/05/2020 Office Weight: 161 lbs   AT/AF Burden: <1%   Transmission reviewed.     CorVue Thoracic impedance suggesting possible fluid accumulation starting 8/10 and returning to baseline 8/21.   Prescribed:  Furosemide 20 mg take 1 tablet  (20 MG total) two times daily Hydrochlorothiazide 50 mg Take 50 mg by mouth every morning & 100 mg in the evening   Labs: 08/01/2020 Creatinine 3.17, BUN 54, Potassium 3.6, Sodium 142, GFR 18 A complete set of results can be found in Results Review.   Recommendations: No changes   Follow-up plan: ICM clinic phone appointment on 12/15/2020.   91 day device clinic remote transmission 01/27/2021.     EP/Cardiology Office Visits: 11/11/2020 with Dr Domenic Polite.   05/22/2021 with Dr Rayann Heman.    Copy of ICM check sent to [name] and Dr Domenic Polite for [facility] since patient has OV appointment on 8/23.    3 month ICM trend: 11/10/2020.    1 Year ICM trend:       Rosalene Billings, RN 11/10/2020 10:28 AM

## 2020-11-11 ENCOUNTER — Ambulatory Visit (INDEPENDENT_AMBULATORY_CARE_PROVIDER_SITE_OTHER): Payer: Medicare Other | Admitting: Cardiology

## 2020-11-11 ENCOUNTER — Encounter: Payer: Self-pay | Admitting: Cardiology

## 2020-11-11 VITALS — BP 134/70 | HR 72 | Ht 68.0 in

## 2020-11-11 DIAGNOSIS — N184 Chronic kidney disease, stage 4 (severe): Secondary | ICD-10-CM | POA: Diagnosis not present

## 2020-11-11 DIAGNOSIS — I255 Ischemic cardiomyopathy: Secondary | ICD-10-CM | POA: Diagnosis not present

## 2020-11-11 DIAGNOSIS — I5022 Chronic systolic (congestive) heart failure: Secondary | ICD-10-CM

## 2020-11-11 NOTE — Progress Notes (Signed)
Cardiology Office Note  Date: 11/11/2020   ID: RAPHAEL FITZPATRICK, DOB Mar 10, 1933, MRN [id number]  PCP:  Glenda Chroman, MD  Cardiologist:  Rozann Lesches, MD Electrophysiologist:  Thompson Grayer, MD   Chief Complaint  Patient presents with   Cardiac follow-up    History of Present Illness: Clayton Morton is an 87 y.o. male last seen in May.  He is here with assistant for a follow-up visit.  In wheelchair today, less active in general.  He states that he has not been sleeping well.  Also having lower back pain.  He has a St. Jude ICD in place followed by [name].  Recent device interrogation showed episodic NSVT, no device shocks.  Thoracic impedance yesterday showed transient fluid accumulation with return to normal just recently.  I went over his medications, he does not report any increasing use of Lasix over baseline.  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  Past Medical History:  Diagnosis Date   Age-related macular degeneration, wet, right eye ([place])    AICD (automatic cardioverter/defibrillator) present 10/30/2014   Cerebrovascular accident [facility])    Right subcortical infarct 6270   Chronic systolic congestive heart failure ([place])    a) ECHO (02/2012): EF 30-35%, no obvious thrombus b) ECHO (11/2013): EF 10%, AV mildly calcified, mild MR, RV mod dialted and sys fx sev reduced, TAPSE 0.8, lateral annulus peak S velocity 5.4 c) RHC (11/29/13): RA 15, RV: 65/19, PA 49%, CO/CI: 3.25 / 1.7 d) RHC (12/03/13): RA 1, RV 25/1/2, PA 27/10 (16), PCWP 6, Fick CO/CI: 5.3 / 2.9, PVR 1.9 WU, PA sat 72 and 74%    Coronary atherosclerosis of native coronary artery    1) CABG 2005 2) LHC (910/15): patent SVG to PDA, occlusion of SVG sequence to PLA, patency of LIMA to LAD and patency of SVG to diagonal with sev stenosis at coronary anastomotic site   Diabetes mellitus, type 2 ([place])    Essential hypertension    History of gout    Hyperlipidemia    Ischemic cardiomyopathy    Kidney stones     Macular degeneration    Dr. [name]   Non-ST elevation MI (NSTEMI) [facility])    2005   Varicose veins     Past Surgical History:  Procedure Laterality Date   CARDIAC CATHETERIZATION     CARDIAC DEFIBRILLATOR PLACEMENT  10/30/2014   CATARACT EXTRACTION W/ INTRAOCULAR LENS  IMPLANT, BILATERAL Bilateral ~ [place]     CORONARY ARTERY BYPASS GRAFT  2005   LIMA to LAD, SVG diagonal, SVG to PDA and PLA   CYSTOSCOPY W/ LITHOLAPAXY / EHL     EP IMPLANTABLE DEVICE N/A 10/30/2014   Procedure: ICD Implant;  Surgeon: Deboraha Sprang, MD, SJM Ellipse DR implanted for primary prevention   LEFT AND RIGHT HEART CATHETERIZATION WITH CORONARY/GRAFT ANGIOGRAM N/A 11/29/2013   Procedure: LEFT AND RIGHT HEART CATHETERIZATION WITH Beatrix Fetters;  Surgeon: Blane Ohara, MD;  Location[facility] CATH LAB;  Service: Cardiovascular;  Laterality: N/A;   RIGHT HEART CATHETERIZATION N/A 12/03/2013   Procedure: RIGHT HEART CATH;  Surgeon: Jolaine Artist, MD;  Location: [facility] CATH LAB;  Service: Cardiovascular;  Laterality: N/A;   TONSILLECTOMY      Current Outpatient Medications  Medication Sig Dispense Refill   allopurinol (ZYLOPRIM) 100 MG tablet Take 1 tablet by mouth daily.     aspirin 81 MG tablet Take 81 mg by mouth  daily as needed.      atorvastatin (LIPITOR) 80 MG tablet take 1 tablet by mouth once daily 90 tablet 3   carvedilol (COREG) 12.5 MG tablet TAKE ONE TABLET BY MOUTH TWICE DAILY. TAKE WITH A MEAL. 180 tablet 2   furosemide (LASIX) 20 MG tablet Take 20 mg by mouth 2 (two) times daily.     glipiZIDE (GLUCOTROL XL) 10 MG 24 hr tablet Take 10 mg by mouth 2 (two) times daily.     hydrALAZINE (APRESOLINE) 50 MG tablet TAKE ONE TABLET BY MOUTH THREE TIMES DAILY 90 tablet 6   isosorbide mononitrate (IMDUR) 60 MG 24 hr tablet TAKE ONE TABLET BY MOUTH DAILY 90 tablet 3   Omega-3 Fatty Acids (FISH OIL) 1000 MG CAPS Take 1 capsule by mouth 2 (two) times daily.     ONE TOUCH ULTRA  TEST test strip 1 each by Other route 2 (two) times daily.   0   repaglinide (PRANDIN) 1 MG tablet Take 1 mg by mouth daily.     rOPINIRole (REQUIP) 0.25 MG tablet Take 0.5 mg by mouth at bedtime.     SYNTHROID 75 MCG tablet Take 1 tablet by mouth daily.     ALPRAZolam (XANAX) 0.25 MG tablet Take 0.5 tablets by mouth daily. (Patient not taking: Reported on 11/11/2020)  0   clonazePAM (KLONOPIN) 0.5 MG tablet Take 0.25-0.5 mg by mouth at bedtime.     No current facility-administered medications for this visit.   Allergies:  Tetracycline   ROS: Hearing loss.  Insomnia.  Physical Exam: VS:  BP 134/70   Pulse 72   Ht 5\' 8"  (1.727 m)   SpO2 94%   BMI 24.48 kg/m , BMI Body mass index is 24.48 kg/m.  Wt Readings from Last 3 Encounters:  08/05/20 161 lb (73 kg)  05/23/20 170 lb 6.4 oz (77.3 kg)  04/17/20 163 lb (73.9 kg)    General: Elderly male, in wheelchair, appears comfortable at rest. HEENT: Conjunctiva and lids normal, wearing a mask. Neck: Supple, no elevated JVP or carotid bruits, no thyromegaly. Lungs: Clear to auscultation, nonlabored breathing at rest. Cardiac: Regular rate and rhythm, no S3, 2/6 systolic murmur. Abdomen: Soft, nontender, bowel sounds present. Extremities: No pitting edema.  ECG:  An ECG dated 02/01/2020 was personally reviewed today and demonstrated:  Atrial paced rhythm with right bundle branch block and left anterior fascicular block.  Recent Labwork: 08/01/2020: BUN 54; Creatinine, Ser 3.17; Potassium 3.6; Sodium 142   Other Studies Reviewed Today:  Echocardiogram 11/29/2018:  1. Stage 1: N/A: Basal and mid inferior wall is abnormal.   2. The left ventricle has severely reduced systolic function, with an  ejection fraction of 25-30%. The cavity size was mildly dilated. There is  severe concentric left ventricular hypertrophy. Left ventricular diastolic  Doppler parameters are consistent  with impaired relaxation. Left ventricular diffuse  hypokinesis.   3. The right ventricle has moderately reduced systolic function. The  cavity was normal. There is mildly increased right ventricular wall  thickness.   4. The aortic valve is tricuspid. Moderate thickening of the aortic  valve. Mild calcification of the aortic valve. Aortic valve regurgitation  is mild by color flow Doppler. Mild-moderate stenosis of the aortic valve.  Moderate aortic annular  calcification noted.   5. The mitral valve is degenerative. Moderate thickening of the mitral  valve leaflet. There is mild to moderate mitral annular calcification  present.   6. The tricuspid valve is grossly normal.  7. The aorta is normal unless otherwise noted.   8. The interatrial septum was not well visualized.   Assessment and Plan:  1.  Ischemic cardiomyopathy with chronic combined heart failure.  LVEF 25 to 30% by last assessment, we have been following him conservatively on medical therapy without reimaging.  Plan to follow-up with a BMET.  Currently on aspirin, Lipitor, Coreg, hydralazine, Imdur, and Lasix.  Not candidate for ARB, ANRI, SGLT2 inhibitor, or Aldactone.  2.  CKD stage IV, last creatinine 3.17.  He is no longer following with nephrology.  3.  CAD status post CABG with known graft disease being managed conservatively.  Continue aspirin and statin.  4.  St. Jude ICD in place with followed by [name].  Medication Adjustments/Labs and Tests Ordered: Current medicines are reviewed at length with the patient today.  Concerns regarding medicines are outlined above.   Tests Ordered: Orders Placed This Encounter  Procedures   Basic metabolic panel     Medication Changes: No orders of the defined types were placed in this encounter.   Disposition:  Follow up  3 months.  Signed, Satira Sark, MD, [facility] 11/11/2020 3:46 PM    [place] [place] at [address], [place], [place] [address] Phone: [phone]; Fax: [phone]

## 2020-11-11 NOTE — Patient Instructions (Addendum)
Medication Instructions:  Your physician recommends that you continue on your current medications as directed. Please refer to the Current Medication list given to you today.  Labwork: BMET today or tomorrow at [facility]  Testing/Procedures: none  Follow-Up: Your physician recommends that you schedule a follow-up appointment in: 3 months  Any Other Special Instructions Will Be Listed Below (If Applicable).  If you need a refill on your cardiac medications before your next appointment, please call your pharmacy.

## 2020-11-17 ENCOUNTER — Other Ambulatory Visit: Payer: Self-pay | Admitting: Cardiology

## 2020-11-20 NOTE — Progress Notes (Signed)
Remote ICD transmission.   

## 2020-12-15 ENCOUNTER — Ambulatory Visit (INDEPENDENT_AMBULATORY_CARE_PROVIDER_SITE_OTHER): Payer: Medicare Other

## 2020-12-15 DIAGNOSIS — I5022 Chronic systolic (congestive) heart failure: Secondary | ICD-10-CM | POA: Diagnosis not present

## 2020-12-15 DIAGNOSIS — Z95810 Presence of automatic (implantable) cardiac defibrillator: Secondary | ICD-10-CM | POA: Diagnosis not present

## 2020-12-15 NOTE — Progress Notes (Signed)
EPIC Encounter for ICM Monitoring  Patient Name: Clayton Morton is a 87 y.o. male Date: 12/15/2020 Primary Care Physican: Glenda Chroman, MD Primary Cardiologist: Domenic Polite Electrophysiologist: Allred 08/05/2020 Office Weight: 161 lbs   AT/AF Burden: <1%   Transmission reviewed.     CorVue Thoracic impedance suggesting normal fluid levels.   Prescribed:  Furosemide 20 mg take 1 tablet  (20 MG total) two times daily Hydrochlorothiazide 50 mg Take 50 mg by mouth every morning & 100 mg in the evening   Labs: 08/01/2020 Creatinine 3.17, BUN 54, Potassium 3.6, Sodium 142, GFR 18 A complete set of results can be found in Results Review.   Recommendations: No changes   Follow-up plan: ICM clinic phone appointment on 01/19/2021.   91 day device clinic remote transmission 01/27/2021.     EP/Cardiology Office Visits: 11/11/2020 with Dr Domenic Polite.   05/22/2021 with Dr Rayann Heman.    Copy of ICM check sent to [name].   3 month Direct Trend view ICM trend: 12/13/2020.    Rosalene Billings, RN 12/15/2020 5:00 PM

## 2020-12-19 DIAGNOSIS — G4700 Insomnia, unspecified: Secondary | ICD-10-CM | POA: Diagnosis not present

## 2020-12-19 DIAGNOSIS — E039 Hypothyroidism, unspecified: Secondary | ICD-10-CM | POA: Diagnosis not present

## 2021-01-02 DIAGNOSIS — N184 Chronic kidney disease, stage 4 (severe): Secondary | ICD-10-CM | POA: Diagnosis not present

## 2021-01-02 DIAGNOSIS — M109 Gout, unspecified: Secondary | ICD-10-CM | POA: Diagnosis not present

## 2021-01-02 DIAGNOSIS — E785 Hyperlipidemia, unspecified: Secondary | ICD-10-CM | POA: Diagnosis not present

## 2021-01-02 DIAGNOSIS — I10 Essential (primary) hypertension: Secondary | ICD-10-CM | POA: Diagnosis not present

## 2021-01-02 DIAGNOSIS — E118 Type 2 diabetes mellitus with unspecified complications: Secondary | ICD-10-CM | POA: Diagnosis not present

## 2021-01-02 DIAGNOSIS — R531 Weakness: Secondary | ICD-10-CM | POA: Diagnosis not present

## 2021-01-02 DIAGNOSIS — I509 Heart failure, unspecified: Secondary | ICD-10-CM | POA: Diagnosis not present

## 2021-01-02 DIAGNOSIS — I255 Ischemic cardiomyopathy: Secondary | ICD-10-CM | POA: Diagnosis not present

## 2021-01-02 DIAGNOSIS — I214 Non-ST elevation (NSTEMI) myocardial infarction: Secondary | ICD-10-CM | POA: Diagnosis not present

## 2021-01-02 DIAGNOSIS — R609 Edema, unspecified: Secondary | ICD-10-CM | POA: Diagnosis not present

## 2021-01-02 DIAGNOSIS — I2510 Atherosclerotic heart disease of native coronary artery without angina pectoris: Secondary | ICD-10-CM | POA: Diagnosis not present

## 2021-01-02 DIAGNOSIS — I639 Cerebral infarction, unspecified: Secondary | ICD-10-CM | POA: Diagnosis not present

## 2021-01-02 DIAGNOSIS — Z95810 Presence of automatic (implantable) cardiac defibrillator: Secondary | ICD-10-CM | POA: Diagnosis not present

## 2021-01-06 DIAGNOSIS — I509 Heart failure, unspecified: Secondary | ICD-10-CM | POA: Diagnosis not present

## 2021-01-06 DIAGNOSIS — E118 Type 2 diabetes mellitus with unspecified complications: Secondary | ICD-10-CM | POA: Diagnosis not present

## 2021-01-06 DIAGNOSIS — I2510 Atherosclerotic heart disease of native coronary artery without angina pectoris: Secondary | ICD-10-CM | POA: Diagnosis not present

## 2021-01-06 DIAGNOSIS — Z95810 Presence of automatic (implantable) cardiac defibrillator: Secondary | ICD-10-CM | POA: Diagnosis not present

## 2021-01-06 DIAGNOSIS — N184 Chronic kidney disease, stage 4 (severe): Secondary | ICD-10-CM | POA: Diagnosis not present

## 2021-01-06 DIAGNOSIS — I639 Cerebral infarction, unspecified: Secondary | ICD-10-CM | POA: Diagnosis not present

## 2021-01-13 DIAGNOSIS — I639 Cerebral infarction, unspecified: Secondary | ICD-10-CM | POA: Diagnosis not present

## 2021-01-13 DIAGNOSIS — E118 Type 2 diabetes mellitus with unspecified complications: Secondary | ICD-10-CM | POA: Diagnosis not present

## 2021-01-13 DIAGNOSIS — I2510 Atherosclerotic heart disease of native coronary artery without angina pectoris: Secondary | ICD-10-CM | POA: Diagnosis not present

## 2021-01-13 DIAGNOSIS — I509 Heart failure, unspecified: Secondary | ICD-10-CM | POA: Diagnosis not present

## 2021-01-13 DIAGNOSIS — Z95810 Presence of automatic (implantable) cardiac defibrillator: Secondary | ICD-10-CM | POA: Diagnosis not present

## 2021-01-13 DIAGNOSIS — N184 Chronic kidney disease, stage 4 (severe): Secondary | ICD-10-CM | POA: Diagnosis not present

## 2021-01-19 ENCOUNTER — Ambulatory Visit (INDEPENDENT_AMBULATORY_CARE_PROVIDER_SITE_OTHER): Payer: Medicare Other

## 2021-01-19 DIAGNOSIS — I5022 Chronic systolic (congestive) heart failure: Secondary | ICD-10-CM | POA: Diagnosis not present

## 2021-01-19 DIAGNOSIS — Z95810 Presence of automatic (implantable) cardiac defibrillator: Secondary | ICD-10-CM

## 2021-01-19 NOTE — Progress Notes (Signed)
EPIC Encounter for ICM Monitoring  Patient Name: Clayton Morton is a 87 y.o. male Date: 01/19/2021 Primary Care Physican: Glenda Chroman, MD Primary Cardiologist: Domenic Polite Electrophysiologist: Allred 08/05/2020 Office Weight: 161 lbs   AT/AF Burden: <1%   Transmission reviewed.     CorVue Thoracic impedance suggesting normal fluid levels.   Prescribed:  Furosemide 20 mg take 1 tablet  (20 MG total) two times daily Hydrochlorothiazide 50 mg Take 50 mg by mouth every morning & 100 mg in the evening   Labs: 08/01/2020 Creatinine 3.17, BUN 54, Potassium 3.6, Sodium 142, GFR 18 A complete set of results can be found in Results Review.   Recommendations: No changes   Follow-up plan: ICM clinic phone appointment on 02/23/2021.   91 day device clinic remote transmission 01/27/2021.     EP/Cardiology Office Visits:  02/11/2021 with Dr Domenic Polite.   05/22/2021 with Dr Rayann Heman.    Copy of ICM check sent to [name].    3 month ICM trend: 01/17/2021.    Rosalene Billings, RN 01/19/2021 4:51 PM

## 2021-01-20 DIAGNOSIS — I639 Cerebral infarction, unspecified: Secondary | ICD-10-CM | POA: Diagnosis not present

## 2021-01-20 DIAGNOSIS — I255 Ischemic cardiomyopathy: Secondary | ICD-10-CM | POA: Diagnosis not present

## 2021-01-20 DIAGNOSIS — E785 Hyperlipidemia, unspecified: Secondary | ICD-10-CM | POA: Diagnosis not present

## 2021-01-20 DIAGNOSIS — I2510 Atherosclerotic heart disease of native coronary artery without angina pectoris: Secondary | ICD-10-CM | POA: Diagnosis not present

## 2021-01-20 DIAGNOSIS — Z95810 Presence of automatic (implantable) cardiac defibrillator: Secondary | ICD-10-CM | POA: Diagnosis not present

## 2021-01-20 DIAGNOSIS — R609 Edema, unspecified: Secondary | ICD-10-CM | POA: Diagnosis not present

## 2021-01-20 DIAGNOSIS — I509 Heart failure, unspecified: Secondary | ICD-10-CM | POA: Diagnosis not present

## 2021-01-20 DIAGNOSIS — N184 Chronic kidney disease, stage 4 (severe): Secondary | ICD-10-CM | POA: Diagnosis not present

## 2021-01-20 DIAGNOSIS — I10 Essential (primary) hypertension: Secondary | ICD-10-CM | POA: Diagnosis not present

## 2021-01-20 DIAGNOSIS — R531 Weakness: Secondary | ICD-10-CM | POA: Diagnosis not present

## 2021-01-20 DIAGNOSIS — E118 Type 2 diabetes mellitus with unspecified complications: Secondary | ICD-10-CM | POA: Diagnosis not present

## 2021-01-20 DIAGNOSIS — M109 Gout, unspecified: Secondary | ICD-10-CM | POA: Diagnosis not present

## 2021-01-20 DIAGNOSIS — I214 Non-ST elevation (NSTEMI) myocardial infarction: Secondary | ICD-10-CM | POA: Diagnosis not present

## 2021-01-27 ENCOUNTER — Telehealth: Payer: Self-pay

## 2021-01-27 ENCOUNTER — Ambulatory Visit (INDEPENDENT_AMBULATORY_CARE_PROVIDER_SITE_OTHER): Payer: Medicare Other

## 2021-01-27 DIAGNOSIS — I639 Cerebral infarction, unspecified: Secondary | ICD-10-CM | POA: Diagnosis not present

## 2021-01-27 DIAGNOSIS — Z95810 Presence of automatic (implantable) cardiac defibrillator: Secondary | ICD-10-CM | POA: Diagnosis not present

## 2021-01-27 DIAGNOSIS — I509 Heart failure, unspecified: Secondary | ICD-10-CM | POA: Diagnosis not present

## 2021-01-27 DIAGNOSIS — I255 Ischemic cardiomyopathy: Secondary | ICD-10-CM

## 2021-01-27 DIAGNOSIS — I2510 Atherosclerotic heart disease of native coronary artery without angina pectoris: Secondary | ICD-10-CM | POA: Diagnosis not present

## 2021-01-27 DIAGNOSIS — N184 Chronic kidney disease, stage 4 (severe): Secondary | ICD-10-CM | POA: Diagnosis not present

## 2021-01-27 DIAGNOSIS — E118 Type 2 diabetes mellitus with unspecified complications: Secondary | ICD-10-CM | POA: Diagnosis not present

## 2021-01-27 LAB — CUP PACEART REMOTE DEVICE CHECK
Battery Remaining Longevity: 28 mo
Battery Remaining Percentage: 29 %
Battery Voltage: 2.83 V
Brady Statistic AP VP Percent: 1.7 %
Brady Statistic AP VS Percent: 13 %
Brady Statistic AS VP Percent: 1 %
Brady Statistic AS VS Percent: 82 %
Brady Statistic RA Percent Paced: 10 %
Brady Statistic RV Percent Paced: 1.8 %
Date Time Interrogation Session: 20221108020015
HighPow Impedance: 54 Ohm
HighPow Impedance: 54 Ohm
Implantable Lead Implant Date: 20160810
Implantable Lead Implant Date: 20160810
Implantable Lead Location: 753859
Implantable Lead Location: 753860
Implantable Pulse Generator Implant Date: 20160810
Lead Channel Impedance Value: 290 Ohm
Lead Channel Impedance Value: 380 Ohm
Lead Channel Pacing Threshold Amplitude: 1 V
Lead Channel Pacing Threshold Amplitude: 1.375 V
Lead Channel Pacing Threshold Pulse Width: 0.5 ms
Lead Channel Pacing Threshold Pulse Width: 0.5 ms
Lead Channel Sensing Intrinsic Amplitude: 1.5 mV
Lead Channel Sensing Intrinsic Amplitude: 8.5 mV
Lead Channel Setting Pacing Amplitude: 1.625
Lead Channel Setting Pacing Amplitude: 2 V
Lead Channel Setting Pacing Pulse Width: 0.5 ms
Lead Channel Setting Sensing Sensitivity: 0.5 mV
Pulse Gen Serial Number: 7179751

## 2021-01-27 NOTE — Telephone Encounter (Signed)
Incoming call from "Clayton Morton" who identifies herself as patient caregiver. Clayton Morton placed device RN on speaker phone with patient. Patient states he is not doing well and under Hospice long term care. Discussed frequency and length of NSVT. Patient states hospice social worker and RN recently had conversation wit him about his wishes and options to have his tachy therapies on his ICD turned off. Patient wishes to continue to think about it at this time. He is taking all medications as prescribed. He is provided device clinic contact and encouraged to call if and when he decides to have therapies discontinued. Informed patient [name] will be updated. Of note caregiver states patient is retaining a significant amt of fluid and having difficulty ambulating. Hospice RN aware.

## 2021-01-27 NOTE — Telephone Encounter (Signed)
Scheduled remote reviewed. Normal device function.   12 NSVT episodes in monitor zone with CL 316-338ms, some EGM's do not show termination, ? longest episode. Monitor zone >181, VT detection >200bpm. Routing for further review.   Transmission and last office note with [name] reviewed (05/23/20). Patient is also followed by [name] and ICM clinic. All VT episodes appear to have same morphology and all >20 beats. Unsuccessful telephone encounter to patient to follow up on s/s and medication compliance. Hip[employer] compliant VM message left requesting call back to [phone]. Will route to [name] for review once symptoms and med compliance reviewed.

## 2021-02-03 DIAGNOSIS — I639 Cerebral infarction, unspecified: Secondary | ICD-10-CM | POA: Diagnosis not present

## 2021-02-03 DIAGNOSIS — E118 Type 2 diabetes mellitus with unspecified complications: Secondary | ICD-10-CM | POA: Diagnosis not present

## 2021-02-03 DIAGNOSIS — I2510 Atherosclerotic heart disease of native coronary artery without angina pectoris: Secondary | ICD-10-CM | POA: Diagnosis not present

## 2021-02-03 DIAGNOSIS — N184 Chronic kidney disease, stage 4 (severe): Secondary | ICD-10-CM | POA: Diagnosis not present

## 2021-02-03 DIAGNOSIS — I509 Heart failure, unspecified: Secondary | ICD-10-CM | POA: Diagnosis not present

## 2021-02-03 DIAGNOSIS — Z95810 Presence of automatic (implantable) cardiac defibrillator: Secondary | ICD-10-CM | POA: Diagnosis not present

## 2021-02-04 NOTE — Progress Notes (Signed)
Remote ICD transmission.   

## 2021-02-10 DIAGNOSIS — Z95810 Presence of automatic (implantable) cardiac defibrillator: Secondary | ICD-10-CM | POA: Diagnosis not present

## 2021-02-10 DIAGNOSIS — E118 Type 2 diabetes mellitus with unspecified complications: Secondary | ICD-10-CM | POA: Diagnosis not present

## 2021-02-10 DIAGNOSIS — I509 Heart failure, unspecified: Secondary | ICD-10-CM | POA: Diagnosis not present

## 2021-02-10 DIAGNOSIS — I639 Cerebral infarction, unspecified: Secondary | ICD-10-CM | POA: Diagnosis not present

## 2021-02-10 DIAGNOSIS — N184 Chronic kidney disease, stage 4 (severe): Secondary | ICD-10-CM | POA: Diagnosis not present

## 2021-02-10 DIAGNOSIS — I2510 Atherosclerotic heart disease of native coronary artery without angina pectoris: Secondary | ICD-10-CM | POA: Diagnosis not present

## 2021-02-11 ENCOUNTER — Ambulatory Visit: Payer: Medicare Other | Admitting: Cardiology

## 2021-02-11 NOTE — Progress Notes (Deleted)
Cardiology Office Note  Date: 02/11/2021   ID: Clayton Morton, DOB 09/20/1932, MRN [id number]  PCP:  Glenda Chroman, MD  Cardiologist:  Rozann Lesches, MD Electrophysiologist:  Thompson Grayer, MD   No chief complaint on file.   History of Present Illness: Clayton Morton is an 87 y.o. male last seen in August.  He has a [place] ICD in place with followed by [name].  Past Medical History:  Diagnosis Date   Age-related macular degeneration, wet, right eye ([place])    AICD (automatic cardioverter/defibrillator) present 10/30/2014   Cerebrovascular accident [facility])    Right subcortical infarct 2633   Chronic systolic congestive heart failure ([place])    a) ECHO (02/2012): EF 30-35%, no obvious thrombus b) ECHO (11/2013): EF 10%, AV mildly calcified, mild MR, RV mod dialted and sys fx sev reduced, TAPSE 0.8, lateral annulus peak S velocity 5.4 c) RHC (11/29/13): RA 15, RV: 65/19, PA 49%, CO/CI: 3.25 / 1.7 d) RHC (12/03/13): RA 1, RV 25/1/2, PA 27/10 (16), PCWP 6, Fick CO/CI: 5.3 / 2.9, PVR 1.9 WU, PA sat 72 and 74%    Coronary atherosclerosis of native coronary artery    1) CABG 2005 2) LHC (910/15): patent SVG to PDA, occlusion of SVG sequence to PLA, patency of LIMA to LAD and patency of SVG to diagonal with sev stenosis at coronary anastomotic site   Diabetes mellitus, type 2 ([place])    Essential hypertension    History of gout    Hyperlipidemia    Ischemic cardiomyopathy    Kidney stones    Macular degeneration    Dr. [name]   Non-ST elevation MI (NSTEMI) [facility])    2005   Varicose veins     Past Surgical History:  Procedure Laterality Date   CARDIAC CATHETERIZATION     CARDIAC DEFIBRILLATOR PLACEMENT  10/30/2014   CATARACT EXTRACTION W/ INTRAOCULAR LENS  IMPLANT, BILATERAL Bilateral ~ [place]     CORONARY ARTERY BYPASS GRAFT  2005   LIMA to LAD, SVG diagonal, SVG to PDA and PLA   CYSTOSCOPY W/ LITHOLAPAXY / [facility]     EP IMPLANTABLE DEVICE N/A 10/30/2014    Procedure: ICD Implant;  Surgeon: Deboraha Sprang, MD, SJM Ellipse DR implanted for primary prevention   LEFT AND RIGHT HEART CATHETERIZATION WITH CORONARY/GRAFT ANGIOGRAM N/A 11/29/2013   Procedure: LEFT AND RIGHT HEART CATHETERIZATION WITH Beatrix Fetters;  Surgeon: Blane Ohara, MD;  Location: [facility] C[facility] LAB;  Service: Cardiovascular;  Laterality: N/A;   RIGHT HEART CATHETERIZATION N/A 12/03/2013   Procedure: RIGHT HEART CATH;  Surgeon: Jolaine Artist, MD;  Location: [facility] CATH LAB;  Service: Cardiovascular;  Laterality: N/A;   TONSILLECTOMY      Current Outpatient Medications  Medication Sig Dispense Refill   allopurinol (ZYLOPRIM) 100 MG tablet Take 1 tablet by mouth daily.     ALPRAZolam (XANAX) 0.25 MG tablet Take 0.5 tablets by mouth daily. (Patient not taking: Reported on 11/11/2020)  0   aspirin 81 MG tablet Take 81 mg by mouth daily as needed.      atorvastatin (LIPITOR) 80 MG tablet take 1 tablet by mouth once daily 90 tablet 3   carvedilol (COREG) 12.5 MG tablet TAKE ONE TABLET BY MOUTH TWICE DAILY. TAKE WITH A MEAL. 180 tablet 2   clonazePAM (KLONOPIN) 0.5 MG tablet Take 0.25-0.5 mg by mouth at bedtime.     furosemide (LASIX) 20 MG tablet Take 20 mg by mouth  2 (two) times daily.     glipiZIDE (GLUCOTROL XL) 10 MG 24 hr tablet Take 10 mg by mouth 2 (two) times daily.     hydrALAZINE (APRESOLINE) 50 MG tablet TAKE ONE TABLET BY MOUTH THREE TIMES DAILY 90 tablet 6   isosorbide mononitrate (IMDUR) 60 MG 24 hr tablet TAKE ONE TABLET BY MOUTH DAILY 90 tablet 3   Omega-3 Fatty Acids (FISH OIL) 1000 MG CAPS Take 1 capsule by mouth 2 (two) times daily.     ONE TOUCH ULTRA TEST test strip 1 each by Other route 2 (two) times daily.   0   repaglinide (PRANDIN) 1 MG tablet Take 1 mg by mouth daily.     rOPINIRole (REQUIP) 0.25 MG tablet Take 0.5 mg by mouth at bedtime.     SYNTHROID 75 MCG tablet Take 1 tablet by mouth daily.     No current facility-administered medications for this  visit.   Allergies:  Tetracycline   Social History: The patient  reports that he has never smoked. He has never used smokeless tobacco. He reports that he does not drink alcohol and does not use drugs.   Family History: The patient's family history includes Dementia in his mother; Hypertension in his father.   ROS:  Please see the history of present illness. Otherwise, complete review of systems is positive for {NONE DEFAULTED:18576}.  All other systems are reviewed and negative.   Physical Exam: VS:  There were no vitals taken for this visit., BMI There is no height or weight on file to calculate BMI.  Wt Readings from Last 3 Encounters:  08/05/20 161 lb (73 kg)  05/23/20 170 lb 6.4 oz (77.3 kg)  04/17/20 163 lb (73.9 kg)    General: Patient appears comfortable at rest. HEENT: Conjunctiva and lids normal, oropharynx clear with moist mucosa. Neck: Supple, no elevated JVP or carotid bruits, no thyromegaly. Lungs: Clear to auscultation, nonlabored breathing at rest. Cardiac: Regular rate and rhythm, no S3 or significant systolic murmur, no pericardial rub. Abdomen: Soft, nontender, no hepatomegaly, bowel sounds present, no guarding or rebound. Extremities: No pitting edema, distal pulses 2+. Skin: Warm and dry. Musculoskeletal: No kyphosis. Neuropsychiatric: Alert and oriented x3, affect grossly appropriate.  ECG:  An ECG dated 02/01/2020 was personally reviewed today and demonstrated:  Atrial paced rhythm with right bundle branch block and left anterior fascicular block.  Recent Labwork: 08/01/2020: BUN 54; Creatinine, Ser 3.17; Potassium 3.6; Sodium 142     Component Value Date/Time   CHOL 111 08/05/2015 1448   TRIG 156 (H) 08/05/2015 1448   HDL 31 (L) 08/05/2015 1448   CHOLHDL 3.6 08/05/2015 1448   VLDL 31 08/05/2015 1448   LDLCALC 49 08/05/2015 1448    Other Studies Reviewed Today:  Echocardiogram 11/29/2018:  1. Stage 1: N/A: Basal and mid inferior wall is abnormal.    2. The left ventricle has severely reduced systolic function, with an  ejection fraction of 25-30%. The cavity size was mildly dilated. There is  severe concentric left ventricular hypertrophy. Left ventricular diastolic  Doppler parameters are consistent  with impaired relaxation. Left ventricular diffuse hypokinesis.   3. The right ventricle has moderately reduced systolic function. The  cavity was normal. There is mildly increased right ventricular wall  thickness.   4. The aortic valve is tricuspid. Moderate thickening of the aortic  valve. Mild calcification of the aortic valve. Aortic valve regurgitation  is mild by color flow Doppler. Mild-moderate stenosis of the aortic valve.  Moderate aortic annular  calcification noted.   5. The mitral valve is degenerative. Moderate thickening of the mitral  valve leaflet. There is mild to moderate mitral annular calcification  present.   6. The tricuspid valve is grossly normal.   7. The aorta is normal unless otherwise noted.   8. The interatrial septum was not well visualized.   Assessment and Plan:    Medication Adjustments/Labs and Tests Ordered: Current medicines are reviewed at length with the patient today.  Concerns regarding medicines are outlined above.   Tests Ordered: No orders of the defined types were placed in this encounter.   Medication Changes: No orders of the defined types were placed in this encounter.   Disposition:  Follow up {follow up:15908}  Signed, Satira Sark, MD, [facility] 02/11/2021 12:49 PM    [facility] [place], [place], [place] [address] Phone: [phone]; Fax: [phone]

## 2021-02-17 ENCOUNTER — Encounter: Payer: Self-pay | Admitting: Cardiology

## 2021-02-17 DIAGNOSIS — E118 Type 2 diabetes mellitus with unspecified complications: Secondary | ICD-10-CM | POA: Diagnosis not present

## 2021-02-17 DIAGNOSIS — I509 Heart failure, unspecified: Secondary | ICD-10-CM | POA: Diagnosis not present

## 2021-02-17 DIAGNOSIS — Z95810 Presence of automatic (implantable) cardiac defibrillator: Secondary | ICD-10-CM | POA: Diagnosis not present

## 2021-02-17 DIAGNOSIS — I639 Cerebral infarction, unspecified: Secondary | ICD-10-CM | POA: Diagnosis not present

## 2021-02-17 DIAGNOSIS — I2510 Atherosclerotic heart disease of native coronary artery without angina pectoris: Secondary | ICD-10-CM | POA: Diagnosis not present

## 2021-02-17 DIAGNOSIS — N184 Chronic kidney disease, stage 4 (severe): Secondary | ICD-10-CM | POA: Diagnosis not present

## 2021-02-19 DIAGNOSIS — I214 Non-ST elevation (NSTEMI) myocardial infarction: Secondary | ICD-10-CM | POA: Diagnosis not present

## 2021-02-19 DIAGNOSIS — Z95810 Presence of automatic (implantable) cardiac defibrillator: Secondary | ICD-10-CM | POA: Diagnosis not present

## 2021-02-19 DIAGNOSIS — I509 Heart failure, unspecified: Secondary | ICD-10-CM | POA: Diagnosis not present

## 2021-02-19 DIAGNOSIS — I2510 Atherosclerotic heart disease of native coronary artery without angina pectoris: Secondary | ICD-10-CM | POA: Diagnosis not present

## 2021-02-19 DIAGNOSIS — M109 Gout, unspecified: Secondary | ICD-10-CM | POA: Diagnosis not present

## 2021-02-19 DIAGNOSIS — I10 Essential (primary) hypertension: Secondary | ICD-10-CM | POA: Diagnosis not present

## 2021-02-19 DIAGNOSIS — E118 Type 2 diabetes mellitus with unspecified complications: Secondary | ICD-10-CM | POA: Diagnosis not present

## 2021-02-19 DIAGNOSIS — N184 Chronic kidney disease, stage 4 (severe): Secondary | ICD-10-CM | POA: Diagnosis not present

## 2021-02-19 DIAGNOSIS — R609 Edema, unspecified: Secondary | ICD-10-CM | POA: Diagnosis not present

## 2021-02-19 DIAGNOSIS — R531 Weakness: Secondary | ICD-10-CM | POA: Diagnosis not present

## 2021-02-19 DIAGNOSIS — E785 Hyperlipidemia, unspecified: Secondary | ICD-10-CM | POA: Diagnosis not present

## 2021-02-19 DIAGNOSIS — I255 Ischemic cardiomyopathy: Secondary | ICD-10-CM | POA: Diagnosis not present

## 2021-02-19 DIAGNOSIS — I639 Cerebral infarction, unspecified: Secondary | ICD-10-CM | POA: Diagnosis not present

## 2021-02-23 ENCOUNTER — Ambulatory Visit (INDEPENDENT_AMBULATORY_CARE_PROVIDER_SITE_OTHER)

## 2021-02-23 DIAGNOSIS — I5022 Chronic systolic (congestive) heart failure: Secondary | ICD-10-CM | POA: Diagnosis not present

## 2021-02-23 DIAGNOSIS — E118 Type 2 diabetes mellitus with unspecified complications: Secondary | ICD-10-CM | POA: Diagnosis not present

## 2021-02-23 DIAGNOSIS — N184 Chronic kidney disease, stage 4 (severe): Secondary | ICD-10-CM | POA: Diagnosis not present

## 2021-02-23 DIAGNOSIS — Z95810 Presence of automatic (implantable) cardiac defibrillator: Secondary | ICD-10-CM | POA: Diagnosis not present

## 2021-02-23 DIAGNOSIS — I2510 Atherosclerotic heart disease of native coronary artery without angina pectoris: Secondary | ICD-10-CM | POA: Diagnosis not present

## 2021-02-23 DIAGNOSIS — I639 Cerebral infarction, unspecified: Secondary | ICD-10-CM | POA: Diagnosis not present

## 2021-02-23 DIAGNOSIS — I509 Heart failure, unspecified: Secondary | ICD-10-CM | POA: Diagnosis not present

## 2021-02-24 DIAGNOSIS — Z95810 Presence of automatic (implantable) cardiac defibrillator: Secondary | ICD-10-CM | POA: Diagnosis not present

## 2021-02-24 DIAGNOSIS — N184 Chronic kidney disease, stage 4 (severe): Secondary | ICD-10-CM | POA: Diagnosis not present

## 2021-02-24 DIAGNOSIS — I2510 Atherosclerotic heart disease of native coronary artery without angina pectoris: Secondary | ICD-10-CM | POA: Diagnosis not present

## 2021-02-24 DIAGNOSIS — E118 Type 2 diabetes mellitus with unspecified complications: Secondary | ICD-10-CM | POA: Diagnosis not present

## 2021-02-24 DIAGNOSIS — I639 Cerebral infarction, unspecified: Secondary | ICD-10-CM | POA: Diagnosis not present

## 2021-02-24 DIAGNOSIS — I509 Heart failure, unspecified: Secondary | ICD-10-CM | POA: Diagnosis not present

## 2021-02-27 NOTE — Progress Notes (Signed)
EPIC Encounter for ICM Monitoring  Patient Name: Clayton Morton is a 87 y.o. male Date: 02/27/2021 Primary Care Physican: Glenda Chroman, MD Primary Cardiologist: Domenic Polite Electrophysiologist: Allred 08/05/2020 Office Weight: 161 lbs   AT/AF Burden: <1%   Transmission reviewed.     CorVue Thoracic impedance suggesting normal fluid levels.   Prescribed:  Furosemide 20 mg take 1 tablet  (20 MG total) two times daily Hydrochlorothiazide 50 mg Take 50 mg by mouth every morning & 100 mg in the evening   Labs: 08/01/2020 Creatinine 3.17, BUN 54, Potassium 3.6, Sodium 142, GFR 18 A complete set of results can be found in Results Review.   Recommendations: No changes   Follow-up plan: ICM clinic phone appointment on 03/30/2021.   91 day device clinic remote transmission 04/28/2021.     EP/Cardiology Office Visits:    05/22/2021 with Dr Rayann Heman.    Copy of ICM check sent to [name].     3 month ICM trend: 02/23/2021.    12-14 Month ICM trend:       Rosalene Billings, RN 02/27/2021 9:02 AM

## 2021-03-02 DIAGNOSIS — Z95810 Presence of automatic (implantable) cardiac defibrillator: Secondary | ICD-10-CM | POA: Diagnosis not present

## 2021-03-02 DIAGNOSIS — N184 Chronic kidney disease, stage 4 (severe): Secondary | ICD-10-CM | POA: Diagnosis not present

## 2021-03-02 DIAGNOSIS — I509 Heart failure, unspecified: Secondary | ICD-10-CM | POA: Diagnosis not present

## 2021-03-02 DIAGNOSIS — E118 Type 2 diabetes mellitus with unspecified complications: Secondary | ICD-10-CM | POA: Diagnosis not present

## 2021-03-02 DIAGNOSIS — I639 Cerebral infarction, unspecified: Secondary | ICD-10-CM | POA: Diagnosis not present

## 2021-03-02 DIAGNOSIS — I2510 Atherosclerotic heart disease of native coronary artery without angina pectoris: Secondary | ICD-10-CM | POA: Diagnosis not present

## 2021-03-03 DIAGNOSIS — E118 Type 2 diabetes mellitus with unspecified complications: Secondary | ICD-10-CM | POA: Diagnosis not present

## 2021-03-03 DIAGNOSIS — Z95810 Presence of automatic (implantable) cardiac defibrillator: Secondary | ICD-10-CM | POA: Diagnosis not present

## 2021-03-03 DIAGNOSIS — I2510 Atherosclerotic heart disease of native coronary artery without angina pectoris: Secondary | ICD-10-CM | POA: Diagnosis not present

## 2021-03-03 DIAGNOSIS — N184 Chronic kidney disease, stage 4 (severe): Secondary | ICD-10-CM | POA: Diagnosis not present

## 2021-03-03 DIAGNOSIS — I509 Heart failure, unspecified: Secondary | ICD-10-CM | POA: Diagnosis not present

## 2021-03-03 DIAGNOSIS — I639 Cerebral infarction, unspecified: Secondary | ICD-10-CM | POA: Diagnosis not present

## 2021-03-06 DIAGNOSIS — I639 Cerebral infarction, unspecified: Secondary | ICD-10-CM | POA: Diagnosis not present

## 2021-03-06 DIAGNOSIS — E118 Type 2 diabetes mellitus with unspecified complications: Secondary | ICD-10-CM | POA: Diagnosis not present

## 2021-03-06 DIAGNOSIS — I2510 Atherosclerotic heart disease of native coronary artery without angina pectoris: Secondary | ICD-10-CM | POA: Diagnosis not present

## 2021-03-06 DIAGNOSIS — Z95810 Presence of automatic (implantable) cardiac defibrillator: Secondary | ICD-10-CM | POA: Diagnosis not present

## 2021-03-06 DIAGNOSIS — N184 Chronic kidney disease, stage 4 (severe): Secondary | ICD-10-CM | POA: Diagnosis not present

## 2021-03-06 DIAGNOSIS — I509 Heart failure, unspecified: Secondary | ICD-10-CM | POA: Diagnosis not present

## 2021-03-11 DIAGNOSIS — E118 Type 2 diabetes mellitus with unspecified complications: Secondary | ICD-10-CM | POA: Diagnosis not present

## 2021-03-11 DIAGNOSIS — N184 Chronic kidney disease, stage 4 (severe): Secondary | ICD-10-CM | POA: Diagnosis not present

## 2021-03-11 DIAGNOSIS — I509 Heart failure, unspecified: Secondary | ICD-10-CM | POA: Diagnosis not present

## 2021-03-11 DIAGNOSIS — Z95810 Presence of automatic (implantable) cardiac defibrillator: Secondary | ICD-10-CM | POA: Diagnosis not present

## 2021-03-11 DIAGNOSIS — I2510 Atherosclerotic heart disease of native coronary artery without angina pectoris: Secondary | ICD-10-CM | POA: Diagnosis not present

## 2021-03-11 DIAGNOSIS — I639 Cerebral infarction, unspecified: Secondary | ICD-10-CM | POA: Diagnosis not present

## 2021-03-13 DIAGNOSIS — E118 Type 2 diabetes mellitus with unspecified complications: Secondary | ICD-10-CM | POA: Diagnosis not present

## 2021-03-13 DIAGNOSIS — I509 Heart failure, unspecified: Secondary | ICD-10-CM | POA: Diagnosis not present

## 2021-03-13 DIAGNOSIS — N184 Chronic kidney disease, stage 4 (severe): Secondary | ICD-10-CM | POA: Diagnosis not present

## 2021-03-13 DIAGNOSIS — I2510 Atherosclerotic heart disease of native coronary artery without angina pectoris: Secondary | ICD-10-CM | POA: Diagnosis not present

## 2021-03-13 DIAGNOSIS — I639 Cerebral infarction, unspecified: Secondary | ICD-10-CM | POA: Diagnosis not present

## 2021-03-13 DIAGNOSIS — Z95810 Presence of automatic (implantable) cardiac defibrillator: Secondary | ICD-10-CM | POA: Diagnosis not present

## 2021-03-18 DIAGNOSIS — I509 Heart failure, unspecified: Secondary | ICD-10-CM | POA: Diagnosis not present

## 2021-03-18 DIAGNOSIS — I2510 Atherosclerotic heart disease of native coronary artery without angina pectoris: Secondary | ICD-10-CM | POA: Diagnosis not present

## 2021-03-18 DIAGNOSIS — I639 Cerebral infarction, unspecified: Secondary | ICD-10-CM | POA: Diagnosis not present

## 2021-03-18 DIAGNOSIS — E118 Type 2 diabetes mellitus with unspecified complications: Secondary | ICD-10-CM | POA: Diagnosis not present

## 2021-03-18 DIAGNOSIS — Z95810 Presence of automatic (implantable) cardiac defibrillator: Secondary | ICD-10-CM | POA: Diagnosis not present

## 2021-03-18 DIAGNOSIS — N184 Chronic kidney disease, stage 4 (severe): Secondary | ICD-10-CM | POA: Diagnosis not present

## 2021-03-20 DIAGNOSIS — N184 Chronic kidney disease, stage 4 (severe): Secondary | ICD-10-CM | POA: Diagnosis not present

## 2021-03-20 DIAGNOSIS — I2510 Atherosclerotic heart disease of native coronary artery without angina pectoris: Secondary | ICD-10-CM | POA: Diagnosis not present

## 2021-03-20 DIAGNOSIS — E118 Type 2 diabetes mellitus with unspecified complications: Secondary | ICD-10-CM | POA: Diagnosis not present

## 2021-03-20 DIAGNOSIS — Z95810 Presence of automatic (implantable) cardiac defibrillator: Secondary | ICD-10-CM | POA: Diagnosis not present

## 2021-03-20 DIAGNOSIS — I639 Cerebral infarction, unspecified: Secondary | ICD-10-CM | POA: Diagnosis not present

## 2021-03-20 DIAGNOSIS — I509 Heart failure, unspecified: Secondary | ICD-10-CM | POA: Diagnosis not present

## 2021-03-22 DIAGNOSIS — I2510 Atherosclerotic heart disease of native coronary artery without angina pectoris: Secondary | ICD-10-CM | POA: Diagnosis not present

## 2021-03-22 DIAGNOSIS — N184 Chronic kidney disease, stage 4 (severe): Secondary | ICD-10-CM | POA: Diagnosis not present

## 2021-03-22 DIAGNOSIS — I639 Cerebral infarction, unspecified: Secondary | ICD-10-CM | POA: Diagnosis not present

## 2021-03-22 DIAGNOSIS — I509 Heart failure, unspecified: Secondary | ICD-10-CM | POA: Diagnosis not present

## 2021-03-22 DIAGNOSIS — R609 Edema, unspecified: Secondary | ICD-10-CM | POA: Diagnosis not present

## 2021-03-22 DIAGNOSIS — E118 Type 2 diabetes mellitus with unspecified complications: Secondary | ICD-10-CM | POA: Diagnosis not present

## 2021-03-22 DIAGNOSIS — I255 Ischemic cardiomyopathy: Secondary | ICD-10-CM | POA: Diagnosis not present

## 2021-03-22 DIAGNOSIS — Z95810 Presence of automatic (implantable) cardiac defibrillator: Secondary | ICD-10-CM | POA: Diagnosis not present

## 2021-03-22 DIAGNOSIS — R531 Weakness: Secondary | ICD-10-CM | POA: Diagnosis not present

## 2021-03-22 DIAGNOSIS — E785 Hyperlipidemia, unspecified: Secondary | ICD-10-CM | POA: Diagnosis not present

## 2021-03-22 DIAGNOSIS — M109 Gout, unspecified: Secondary | ICD-10-CM | POA: Diagnosis not present

## 2021-03-22 DIAGNOSIS — I214 Non-ST elevation (NSTEMI) myocardial infarction: Secondary | ICD-10-CM | POA: Diagnosis not present

## 2021-03-22 DIAGNOSIS — I10 Essential (primary) hypertension: Secondary | ICD-10-CM | POA: Diagnosis not present

## 2021-03-24 DIAGNOSIS — I509 Heart failure, unspecified: Secondary | ICD-10-CM | POA: Diagnosis not present

## 2021-03-24 DIAGNOSIS — E118 Type 2 diabetes mellitus with unspecified complications: Secondary | ICD-10-CM | POA: Diagnosis not present

## 2021-03-24 DIAGNOSIS — I639 Cerebral infarction, unspecified: Secondary | ICD-10-CM | POA: Diagnosis not present

## 2021-03-24 DIAGNOSIS — I2510 Atherosclerotic heart disease of native coronary artery without angina pectoris: Secondary | ICD-10-CM | POA: Diagnosis not present

## 2021-03-24 DIAGNOSIS — Z95810 Presence of automatic (implantable) cardiac defibrillator: Secondary | ICD-10-CM | POA: Diagnosis not present

## 2021-03-24 DIAGNOSIS — N184 Chronic kidney disease, stage 4 (severe): Secondary | ICD-10-CM | POA: Diagnosis not present

## 2021-03-25 DIAGNOSIS — I509 Heart failure, unspecified: Secondary | ICD-10-CM | POA: Diagnosis not present

## 2021-03-25 DIAGNOSIS — E118 Type 2 diabetes mellitus with unspecified complications: Secondary | ICD-10-CM | POA: Diagnosis not present

## 2021-03-25 DIAGNOSIS — I2510 Atherosclerotic heart disease of native coronary artery without angina pectoris: Secondary | ICD-10-CM | POA: Diagnosis not present

## 2021-03-25 DIAGNOSIS — N184 Chronic kidney disease, stage 4 (severe): Secondary | ICD-10-CM | POA: Diagnosis not present

## 2021-03-25 DIAGNOSIS — I639 Cerebral infarction, unspecified: Secondary | ICD-10-CM | POA: Diagnosis not present

## 2021-03-25 DIAGNOSIS — Z95810 Presence of automatic (implantable) cardiac defibrillator: Secondary | ICD-10-CM | POA: Diagnosis not present

## 2021-03-26 DIAGNOSIS — Z95810 Presence of automatic (implantable) cardiac defibrillator: Secondary | ICD-10-CM | POA: Diagnosis not present

## 2021-03-26 DIAGNOSIS — I509 Heart failure, unspecified: Secondary | ICD-10-CM | POA: Diagnosis not present

## 2021-03-26 DIAGNOSIS — E118 Type 2 diabetes mellitus with unspecified complications: Secondary | ICD-10-CM | POA: Diagnosis not present

## 2021-03-26 DIAGNOSIS — I639 Cerebral infarction, unspecified: Secondary | ICD-10-CM | POA: Diagnosis not present

## 2021-03-26 DIAGNOSIS — I2510 Atherosclerotic heart disease of native coronary artery without angina pectoris: Secondary | ICD-10-CM | POA: Diagnosis not present

## 2021-03-26 DIAGNOSIS — N184 Chronic kidney disease, stage 4 (severe): Secondary | ICD-10-CM | POA: Diagnosis not present

## 2021-03-27 DIAGNOSIS — I509 Heart failure, unspecified: Secondary | ICD-10-CM | POA: Diagnosis not present

## 2021-03-27 DIAGNOSIS — N184 Chronic kidney disease, stage 4 (severe): Secondary | ICD-10-CM | POA: Diagnosis not present

## 2021-03-27 DIAGNOSIS — E118 Type 2 diabetes mellitus with unspecified complications: Secondary | ICD-10-CM | POA: Diagnosis not present

## 2021-03-27 DIAGNOSIS — I2510 Atherosclerotic heart disease of native coronary artery without angina pectoris: Secondary | ICD-10-CM | POA: Diagnosis not present

## 2021-03-27 DIAGNOSIS — I5022 Chronic systolic (congestive) heart failure: Secondary | ICD-10-CM | POA: Diagnosis not present

## 2021-03-27 DIAGNOSIS — H353221 Exudative age-related macular degeneration, left eye, with active choroidal neovascularization: Secondary | ICD-10-CM | POA: Diagnosis not present

## 2021-03-27 DIAGNOSIS — Z95810 Presence of automatic (implantable) cardiac defibrillator: Secondary | ICD-10-CM | POA: Diagnosis not present

## 2021-03-27 DIAGNOSIS — I429 Cardiomyopathy, unspecified: Secondary | ICD-10-CM | POA: Diagnosis not present

## 2021-03-27 DIAGNOSIS — Z299 Encounter for prophylactic measures, unspecified: Secondary | ICD-10-CM | POA: Diagnosis not present

## 2021-03-27 DIAGNOSIS — I639 Cerebral infarction, unspecified: Secondary | ICD-10-CM | POA: Diagnosis not present

## 2021-03-27 DIAGNOSIS — G319 Degenerative disease of nervous system, unspecified: Secondary | ICD-10-CM | POA: Diagnosis not present

## 2021-03-28 ENCOUNTER — Inpatient Hospital Stay (HOSPITAL_COMMUNITY)
Admission: EM | Admit: 2021-03-28 | Discharge: 2021-04-01 | DRG: 291 | Disposition: A | Discharge status: Skilled Nursing Facility | Attending: Internal Medicine | Admitting: Internal Medicine

## 2021-03-28 ENCOUNTER — Emergency Department (HOSPITAL_COMMUNITY)

## 2021-03-28 ENCOUNTER — Other Ambulatory Visit: Payer: Self-pay

## 2021-03-28 ENCOUNTER — Encounter (HOSPITAL_COMMUNITY): Payer: Self-pay | Admitting: *Deleted

## 2021-03-28 DIAGNOSIS — Z9981 Dependence on supplemental oxygen: Secondary | ICD-10-CM | POA: Diagnosis not present

## 2021-03-28 DIAGNOSIS — Z515 Encounter for palliative care: Secondary | ICD-10-CM

## 2021-03-28 DIAGNOSIS — R2681 Unsteadiness on feet: Secondary | ICD-10-CM | POA: Diagnosis present

## 2021-03-28 DIAGNOSIS — N179 Acute kidney failure, unspecified: Secondary | ICD-10-CM | POA: Diagnosis present

## 2021-03-28 DIAGNOSIS — E876 Hypokalemia: Secondary | ICD-10-CM | POA: Diagnosis present

## 2021-03-28 DIAGNOSIS — R531 Weakness: Secondary | ICD-10-CM | POA: Diagnosis not present

## 2021-03-28 DIAGNOSIS — I25119 Atherosclerotic heart disease of native coronary artery with unspecified angina pectoris: Secondary | ICD-10-CM | POA: Diagnosis not present

## 2021-03-28 DIAGNOSIS — D539 Nutritional anemia, unspecified: Secondary | ICD-10-CM | POA: Diagnosis present

## 2021-03-28 DIAGNOSIS — Z8249 Family history of ischemic heart disease and other diseases of the circulatory system: Secondary | ICD-10-CM | POA: Diagnosis not present

## 2021-03-28 DIAGNOSIS — E1165 Type 2 diabetes mellitus with hyperglycemia: Secondary | ICD-10-CM | POA: Diagnosis present

## 2021-03-28 DIAGNOSIS — M109 Gout, unspecified: Secondary | ICD-10-CM | POA: Diagnosis present

## 2021-03-28 DIAGNOSIS — Z743 Need for continuous supervision: Secondary | ICD-10-CM | POA: Diagnosis not present

## 2021-03-28 DIAGNOSIS — E785 Hyperlipidemia, unspecified: Secondary | ICD-10-CM | POA: Diagnosis present

## 2021-03-28 DIAGNOSIS — H353290 Exudative age-related macular degeneration, unspecified eye, stage unspecified: Secondary | ICD-10-CM | POA: Diagnosis present

## 2021-03-28 DIAGNOSIS — E118 Type 2 diabetes mellitus with unspecified complications: Secondary | ICD-10-CM | POA: Diagnosis not present

## 2021-03-28 DIAGNOSIS — I639 Cerebral infarction, unspecified: Secondary | ICD-10-CM | POA: Diagnosis not present

## 2021-03-28 DIAGNOSIS — L8990 Pressure ulcer of unspecified site, unspecified stage: Secondary | ICD-10-CM | POA: Insufficient documentation

## 2021-03-28 DIAGNOSIS — W19XXXA Unspecified fall, initial encounter: Secondary | ICD-10-CM | POA: Diagnosis not present

## 2021-03-28 DIAGNOSIS — Z7401 Bed confinement status: Secondary | ICD-10-CM | POA: Diagnosis not present

## 2021-03-28 DIAGNOSIS — E1122 Type 2 diabetes mellitus with diabetic chronic kidney disease: Secondary | ICD-10-CM | POA: Diagnosis present

## 2021-03-28 DIAGNOSIS — I5023 Acute on chronic systolic (congestive) heart failure: Secondary | ICD-10-CM | POA: Diagnosis present

## 2021-03-28 DIAGNOSIS — Z66 Do not resuscitate: Secondary | ICD-10-CM | POA: Diagnosis present

## 2021-03-28 DIAGNOSIS — R600 Localized edema: Secondary | ICD-10-CM

## 2021-03-28 DIAGNOSIS — E039 Hypothyroidism, unspecified: Secondary | ICD-10-CM | POA: Diagnosis present

## 2021-03-28 DIAGNOSIS — Z20822 Contact with and (suspected) exposure to covid-19: Secondary | ICD-10-CM | POA: Diagnosis present

## 2021-03-28 DIAGNOSIS — I255 Ischemic cardiomyopathy: Secondary | ICD-10-CM | POA: Diagnosis present

## 2021-03-28 DIAGNOSIS — Z8673 Personal history of transient ischemic attack (TIA), and cerebral infarction without residual deficits: Secondary | ICD-10-CM

## 2021-03-28 DIAGNOSIS — I2510 Atherosclerotic heart disease of native coronary artery without angina pectoris: Secondary | ICD-10-CM | POA: Diagnosis present

## 2021-03-28 DIAGNOSIS — Z79899 Other long term (current) drug therapy: Secondary | ICD-10-CM

## 2021-03-28 DIAGNOSIS — I4510 Unspecified right bundle-branch block: Secondary | ICD-10-CM | POA: Diagnosis present

## 2021-03-28 DIAGNOSIS — J9611 Chronic respiratory failure with hypoxia: Secondary | ICD-10-CM | POA: Diagnosis present

## 2021-03-28 DIAGNOSIS — N189 Chronic kidney disease, unspecified: Secondary | ICD-10-CM | POA: Diagnosis present

## 2021-03-28 DIAGNOSIS — I252 Old myocardial infarction: Secondary | ICD-10-CM

## 2021-03-28 DIAGNOSIS — I509 Heart failure, unspecified: Secondary | ICD-10-CM | POA: Diagnosis not present

## 2021-03-28 DIAGNOSIS — Z9181 History of falling: Secondary | ICD-10-CM

## 2021-03-28 DIAGNOSIS — Z95810 Presence of automatic (implantable) cardiac defibrillator: Secondary | ICD-10-CM | POA: Diagnosis not present

## 2021-03-28 DIAGNOSIS — I5043 Acute on chronic combined systolic (congestive) and diastolic (congestive) heart failure: Secondary | ICD-10-CM | POA: Diagnosis present

## 2021-03-28 DIAGNOSIS — R279 Unspecified lack of coordination: Secondary | ICD-10-CM | POA: Diagnosis not present

## 2021-03-28 DIAGNOSIS — E8770 Fluid overload, unspecified: Secondary | ICD-10-CM | POA: Diagnosis not present

## 2021-03-28 DIAGNOSIS — M79672 Pain in left foot: Secondary | ICD-10-CM | POA: Diagnosis present

## 2021-03-28 DIAGNOSIS — Z951 Presence of aortocoronary bypass graft: Secondary | ICD-10-CM | POA: Diagnosis not present

## 2021-03-28 DIAGNOSIS — Z9861 Coronary angioplasty status: Secondary | ICD-10-CM

## 2021-03-28 DIAGNOSIS — L89312 Pressure ulcer of right buttock, stage 2: Secondary | ICD-10-CM | POA: Diagnosis present

## 2021-03-28 DIAGNOSIS — N184 Chronic kidney disease, stage 4 (severe): Secondary | ICD-10-CM | POA: Diagnosis present

## 2021-03-28 DIAGNOSIS — I499 Cardiac arrhythmia, unspecified: Secondary | ICD-10-CM | POA: Diagnosis not present

## 2021-03-28 DIAGNOSIS — R52 Pain, unspecified: Secondary | ICD-10-CM | POA: Diagnosis not present

## 2021-03-28 DIAGNOSIS — I130 Hypertensive heart and chronic kidney disease with heart failure and stage 1 through stage 4 chronic kidney disease, or unspecified chronic kidney disease: Secondary | ICD-10-CM | POA: Diagnosis present

## 2021-03-28 DIAGNOSIS — R739 Hyperglycemia, unspecified: Secondary | ICD-10-CM | POA: Diagnosis not present

## 2021-03-28 DIAGNOSIS — R9431 Abnormal electrocardiogram [ECG] [EKG]: Secondary | ICD-10-CM | POA: Diagnosis not present

## 2021-03-28 LAB — COMPREHENSIVE METABOLIC PANEL
ALT: 17 U/L (ref 0–44)
AST: 23 U/L (ref 15–41)
Albumin: 3.1 g/dL — ABNORMAL LOW (ref 3.5–5.0)
Alkaline Phosphatase: 79 U/L (ref 38–126)
Anion gap: 12 (ref 5–15)
BUN: 65 mg/dL — ABNORMAL HIGH (ref 8–23)
CO2: 28 mmol/L (ref 22–32)
Calcium: 8.5 mg/dL — ABNORMAL LOW (ref 8.9–10.3)
Chloride: 102 mmol/L (ref 98–111)
Creatinine, Ser: 3.54 mg/dL — ABNORMAL HIGH (ref 0.61–1.24)
GFR, Estimated: 16 mL/min — ABNORMAL LOW (ref >60)
Glucose, Bld: 311 mg/dL — ABNORMAL HIGH (ref 70–99)
Potassium: 3.5 mmol/L (ref 3.5–5.1)
Sodium: 142 mmol/L (ref 135–145)
Total Bilirubin: 0.6 mg/dL (ref 0.3–1.2)
Total Protein: 5.8 g/dL — ABNORMAL LOW (ref 6.5–8.1)

## 2021-03-28 LAB — RESP PANEL BY RT-PCR (FLU A&B, COVID) ARPGX2
Influenza A by PCR: NEGATIVE
Influenza B by PCR: NEGATIVE
SARS Coronavirus 2 by RT PCR: NEGATIVE

## 2021-03-28 LAB — CBC WITH DIFFERENTIAL/PLATELET
Abs Immature Granulocytes: 0.03 10*3/uL (ref 0.00–0.07)
Basophils Absolute: 0 10*3/uL (ref 0.0–0.1)
Basophils Relative: 0 %
Eosinophils Absolute: 0 10*3/uL (ref 0.0–0.5)
Eosinophils Relative: 0 %
HCT: 34.1 % — ABNORMAL LOW (ref 39.0–52.0)
Hemoglobin: 10.6 g/dL — ABNORMAL LOW (ref 13.0–17.0)
Immature Granulocytes: 0 %
Lymphocytes Relative: 4 %
Lymphs Abs: 0.3 10*3/uL — ABNORMAL LOW (ref 0.7–4.0)
MCH: 31.7 pg (ref 26.0–34.0)
MCHC: 31.1 g/dL (ref 30.0–36.0)
MCV: 102.1 fL — ABNORMAL HIGH (ref 80.0–100.0)
Monocytes Absolute: 0.5 10*3/uL (ref 0.1–1.0)
Monocytes Relative: 7 %
Neutro Abs: 6.4 10*3/uL (ref 1.7–7.7)
Neutrophils Relative %: 89 %
Platelets: 122 10*3/uL — ABNORMAL LOW (ref 150–400)
RBC: 3.34 MIL/uL — ABNORMAL LOW (ref 4.22–5.81)
RDW: 17.3 % — ABNORMAL HIGH (ref 11.5–15.5)
WBC: 7.2 10*3/uL (ref 4.0–10.5)
nRBC: 0 % (ref 0.0–0.2)

## 2021-03-28 LAB — GLUCOSE, CAPILLARY: Glucose-Capillary: 244 mg/dL — ABNORMAL HIGH (ref 70–99)

## 2021-03-28 LAB — TROPONIN I (HIGH SENSITIVITY)
Troponin I (High Sensitivity): 201 ng/L (ref <18)
Troponin I (High Sensitivity): 248 ng/L (ref <18)

## 2021-03-28 LAB — BRAIN NATRIURETIC PEPTIDE: B Natriuretic Peptide: 2789 pg/mL — ABNORMAL HIGH (ref 0.0–100.0)

## 2021-03-28 LAB — MAGNESIUM: Magnesium: 2.4 mg/dL (ref 1.7–2.4)

## 2021-03-28 MED ORDER — INSULIN ASPART 100 UNIT/ML IJ SOLN
0.0000 [IU] | Freq: Three times a day (TID) | INTRAMUSCULAR | Status: DC
  Administered 2021-03-29: 3 [IU] via SUBCUTANEOUS
  Administered 2021-03-29: 8 [IU] via SUBCUTANEOUS
  Administered 2021-03-29: 2 [IU] via SUBCUTANEOUS
  Administered 2021-03-30 – 2021-03-31 (×4): 5 [IU] via SUBCUTANEOUS
  Administered 2021-04-01: 2 [IU] via SUBCUTANEOUS
  Administered 2021-04-01: 5 [IU] via SUBCUTANEOUS

## 2021-03-28 MED ORDER — ISOSORBIDE MONONITRATE ER 60 MG PO TB24
60.0000 mg | ORAL_TABLET | Freq: Every day | ORAL | Status: DC
  Administered 2021-03-29 – 2021-04-01 (×4): 60 mg via ORAL
  Filled 2021-03-28 (×4): qty 1

## 2021-03-28 MED ORDER — ROPINIROLE HCL 0.25 MG PO TABS
0.5000 mg | ORAL_TABLET | Freq: Every day | ORAL | Status: DC
Start: 2021-03-28 — End: 2021-04-01
  Administered 2021-03-28 – 2021-03-31 (×4): 0.5 mg via ORAL
  Filled 2021-03-28 (×4): qty 2

## 2021-03-28 MED ORDER — ALLOPURINOL 100 MG PO TABS
100.0000 mg | ORAL_TABLET | Freq: Every day | ORAL | Status: DC
  Administered 2021-03-29 – 2021-04-01 (×4): 100 mg via ORAL
  Filled 2021-03-28 (×4): qty 1

## 2021-03-28 MED ORDER — FUROSEMIDE 10 MG/ML IJ SOLN
80.0000 mg | Freq: Once | INTRAMUSCULAR | Status: AC
  Administered 2021-03-28: 80 mg via INTRAVENOUS
  Filled 2021-03-28: qty 8

## 2021-03-28 MED ORDER — POLYETHYLENE GLYCOL 3350 17 G PO PACK: 17.0000 g | PACK | Freq: Every day | ORAL | Status: DC | PRN

## 2021-03-28 MED ORDER — LEVOTHYROXINE SODIUM 75 MCG PO TABS
75.0000 ug | ORAL_TABLET | Freq: Every day | ORAL | Status: DC
  Administered 2021-03-29 – 2021-04-01 (×4): 75 ug via ORAL
  Filled 2021-03-28 (×4): qty 1

## 2021-03-28 MED ORDER — ACETAMINOPHEN 325 MG PO TABS
650.0000 mg | ORAL_TABLET | Freq: Four times a day (QID) | ORAL | Status: DC | PRN
  Filled 2021-03-28: qty 2

## 2021-03-28 MED ORDER — HEPARIN SODIUM (PORCINE) 5000 UNIT/ML IJ SOLN
5000.0000 [IU] | Freq: Three times a day (TID) | INTRAMUSCULAR | Status: DC
  Administered 2021-03-28 – 2021-04-01 (×10): 5000 [IU] via SUBCUTANEOUS
  Filled 2021-03-28 (×11): qty 1

## 2021-03-28 MED ORDER — POTASSIUM CHLORIDE 20 MEQ PO PACK
40.0000 meq | PACK | Freq: Once | ORAL | Status: AC
  Administered 2021-03-28: 40 meq via ORAL
  Filled 2021-03-28: qty 2

## 2021-03-28 MED ORDER — FUROSEMIDE 10 MG/ML IJ SOLN
60.0000 mg | Freq: Two times a day (BID) | INTRAMUSCULAR | Status: DC
  Administered 2021-03-29 – 2021-03-30 (×4): 60 mg via INTRAVENOUS
  Filled 2021-03-28 (×5): qty 6

## 2021-03-28 MED ORDER — CARVEDILOL 12.5 MG PO TABS
12.5000 mg | ORAL_TABLET | Freq: Two times a day (BID) | ORAL | Status: DC
  Administered 2021-03-28 – 2021-04-01 (×8): 12.5 mg via ORAL
  Filled 2021-03-28 (×8): qty 1

## 2021-03-28 MED ORDER — CITALOPRAM HYDROBROMIDE 20 MG PO TABS
20.0000 mg | ORAL_TABLET | Freq: Every day | ORAL | Status: DC
  Administered 2021-03-29 – 2021-04-01 (×4): 20 mg via ORAL
  Filled 2021-03-28 (×4): qty 1

## 2021-03-28 MED ORDER — ACETAMINOPHEN 650 MG RE SUPP: 650.0000 mg | Freq: Four times a day (QID) | RECTAL | Status: DC | PRN

## 2021-03-28 MED ORDER — INSULIN ASPART 100 UNIT/ML IJ SOLN
0.0000 [IU] | Freq: Every day | INTRAMUSCULAR | Status: DC
  Administered 2021-03-28: 2 [IU] via SUBCUTANEOUS

## 2021-03-28 MED ORDER — ATORVASTATIN CALCIUM 40 MG PO TABS
80.0000 mg | ORAL_TABLET | Freq: Every day | ORAL | Status: DC
  Administered 2021-03-29 – 2021-03-31 (×3): 80 mg via ORAL
  Filled 2021-03-28 (×3): qty 2

## 2021-03-28 MED ORDER — ASPIRIN 81 MG PO CHEW
81.0000 mg | CHEWABLE_TABLET | Freq: Every day | ORAL | Status: DC
  Administered 2021-03-29 – 2021-04-01 (×4): 81 mg via ORAL
  Filled 2021-03-28 (×4): qty 1

## 2021-03-28 MED ORDER — CLONAZEPAM 0.5 MG PO TABS
0.5000 mg | ORAL_TABLET | Freq: Every day | ORAL | Status: DC
Start: 2021-03-28 — End: 2021-04-01
  Administered 2021-03-28 – 2021-03-31 (×4): 0.5 mg via ORAL
  Filled 2021-03-28 (×4): qty 1

## 2021-03-28 MED ORDER — ALPRAZOLAM 0.25 MG PO TABS
0.2500 mg | ORAL_TABLET | Freq: Every day | ORAL | Status: DC
  Administered 2021-03-29 – 2021-03-31 (×3): 0.25 mg via ORAL
  Filled 2021-03-28 (×3): qty 1

## 2021-03-28 NOTE — ED Notes (Addendum)
Pt told this nurse that this morning he became weak and fell out of bed which is what caused the shoulder pain. Denies LOC or hitting his head.

## 2021-03-28 NOTE — ED Provider Notes (Signed)
[facility], [place] EMERGENCY DEPARTMENT Provider Note   CSN: [id number] Arrival date & time: 03/28/21  1401     History  Chief Complaint  Patient presents with   Akshay Spang is a 88 y.o. male history including CVA, CAD, type 2 diabetes, history of non-STEMI and history of CHF along with stage V renal disease presenting with 2 complaints, the first being in taking a fall out of bed this morning.  He describes that he sleeps in a hospital bed at home and rolled over in his sleep and fell landing on his right shoulder.  He denies head injury, but does endorse pain at the right shoulder, also has some soreness across to his left posterior pelvis region.  He has no other complaints of pain with this fall.  Stepson at the bedside reports that he has had been having increasing generalized weakness and difficulty ambulating along with shortness of breath felt to be secondary to worsening of his fluid retention.  He does have a history of CHF and also uses home oxygen 2 L most of the time.  Over the past week he has been having difficulty ambulating more than just 1 or 2 steps using his walker without additional assistance.  Patient does report increasing swelling of his bilateral legs, also involving his arms.  He denies chest pain or palpitations.  He does have orthopnea.  He is currently taking Lasix 20 mg twice daily which does not appear to be helping at this time.  The history is provided by the patient and a relative (stepson at bedside).      Home Medications Prior to Admission medications   Medication Sig Start Date End Date Taking? Authorizing Provider  allopurinol (ZYLOPRIM) 100 MG tablet Take 1 tablet by mouth daily. 12/13/17  Yes [provider]  ALPRAZolam Duanne Moron) 0.5 MG tablet Take 0.5 tablets by mouth daily. 06/30/14  Yes [provider]  aspirin 81 MG tablet Take 81 mg by mouth daily.   Yes [provider]  atorvastatin (LIPITOR) 80 MG tablet take 1  tablet by mouth once daily 06/01/16  Yes Larey Dresser, MD  carvedilol (COREG) 12.5 MG tablet TAKE ONE TABLET BY MOUTH TWICE DAILY. TAKE WITH A MEAL. 11/18/20  Yes Satira Sark, MD  Cholecalciferol (VITAMIN D) 125 MCG (5000 UT) CAPS Take 1 capsule by mouth daily.   Yes [provider]  citalopram (CELEXA) 20 MG tablet Take 20 mg by mouth daily. 03/24/21  Yes [provider]  clonazePAM (KLONOPIN) 0.5 MG tablet Take 0.5 mg by mouth at bedtime. 10/30/20  Yes [provider]  Coenzyme Q10 (CO Q 10 PO) Take 1 tablet by mouth daily.   Yes [provider]  furosemide (LASIX) 20 MG tablet Take 20 mg by mouth 2 (two) times daily.   Yes [provider]  glipiZIDE (GLUCOTROL XL) 10 MG 24 hr tablet Take 10 mg by mouth 2 (two) times daily. 05/22/20  Yes [provider]  hydrALAZINE (APRESOLINE) 50 MG tablet TAKE ONE TABLET BY MOUTH THREE TIMES DAILY 10/13/20  Yes Satira Sark, MD  isosorbide mononitrate (IMDUR) 60 MG 24 hr tablet TAKE ONE TABLET BY MOUTH DAILY 10/10/20  Yes Satira Sark, MD  Multiple Vitamins-Minerals (ICAPS AREDS FORMULA PO) Take 1 tablet by mouth daily.   Yes [provider]  Omega-3 Fatty Acids (FISH OIL) 1000 MG CAPS Take 1 capsule by mouth 2 (two) times daily.   Yes  [provider]  repaglinide (PRANDIN) 1 MG tablet Take 1 mg by mouth daily.   Yes [provider]  rOPINIRole (REQUIP) 0.25 MG tablet Take 0.5 mg by mouth at bedtime. 05/22/18  Yes [provider]  Saw Palmetto 500 MG CAPS Take 1 capsule by mouth daily. 01/02/21  Yes [provider]  SYNTHROID 75 MCG tablet Take 1 tablet by mouth daily. 01/03/18  Yes [provider]  VITAMIN E PO Take 1 tablet by mouth daily.   Yes [provider]  ONE TOUCH ULTRA TEST test strip 1 each by Other route 2 (two) times daily.  09/02/14   [provider]      Allergies    Tetracycline    Review of Systems    Review of Systems  Constitutional:  Negative for fever.  HENT:  Negative for congestion and sore throat.   Eyes: Negative.   Respiratory:  Positive for shortness of breath. Negative for chest tightness.   Cardiovascular:  Positive for leg swelling. Negative for chest pain.  Gastrointestinal:  Negative for abdominal pain, nausea and vomiting.  Genitourinary: Negative.   Musculoskeletal:  Positive for arthralgias. Negative for joint swelling and neck pain.  Skin: Negative.  Negative for rash and wound.  Neurological:  Negative for dizziness, weakness, light-headedness, numbness and headaches.  Psychiatric/Behavioral: Negative.    All other systems reviewed and are negative.  Physical Exam Updated Vital Signs BP 121/73    Pulse 72    Temp 98 F (36.7 C)    Resp 17    Wt 86.6 kg    SpO2 97%    BMI 29.03 kg/m  Physical Exam Vitals and nursing note reviewed.  Constitutional:      Appearance: He is well-developed.  HENT:     Head: Normocephalic and atraumatic.  Eyes:     Conjunctiva/sclera: Conjunctivae normal.  Cardiovascular:     Rate and Rhythm: Normal rate and regular rhythm.     Heart sounds: Normal heart sounds.  Pulmonary:     Effort: Pulmonary effort is normal.     Breath sounds: Rales present. No wheezing or rhonchi.  Abdominal:     General: Bowel sounds are normal.     Palpations: Abdomen is soft.     Tenderness: There is no abdominal tenderness. There is no guarding.  Musculoskeletal:        General: Tenderness present. Normal range of motion.     Right shoulder: Bony tenderness present. No deformity.     Cervical back: Normal range of motion.     Right lower leg: Edema present.     Left lower leg: Edema present.     Comments: Pitting edema of all 4 extremities. Intact clear fluid filled blister right upper medial forearm.   Skin:    General: Skin is warm and dry.  Neurological:     Mental Status: He is alert.    ED Results / Procedures / Treatments    Labs (all labs ordered are listed, but only abnormal results are displayed) Labs Reviewed  CBC WITH DIFFERENTIAL/PLATELET - Abnormal; Notable for the following components:      Result Value   RBC 3.34 (*)    Hemoglobin 10.6 (*)    HCT 34.1 (*)    MCV 102.1 (*)    RDW 17.3 (*)    Platelets 122 (*)    Lymphs Abs 0.3 (*)    All other components within normal limits  COMPREHENSIVE METABOLIC PANEL - Abnormal; Notable  for the following components:   Glucose, Bld 311 (*)    BUN 65 (*)    Creatinine, Ser 3.54 (*)    Calcium 8.5 (*)    Total Protein 5.8 (*)    Albumin 3.1 (*)    GFR, Estimated 16 (*)    All other components within normal limits  BRAIN NATRIURETIC PEPTIDE - Abnormal; Notable for the following components:   B Natriuretic Peptide 2,789.0 (*)    All other components within normal limits  RESP PANEL BY RT-PCR (FLU A&B, COVID) [id number]    EKG EKG Interpretation  Date/Time:  Saturday March 28 2021 14:54:15 EST Ventricular Rate:  72 PR Interval:    QRS Duration: 188 QT Interval:  458 QTC Calculation: 502 R Axis:   -88 Text Interpretation: Atrial fibrillation Right bundle branch block Abnormal T, consider ischemia, lateral leads Confirmed by Milton Ferguson [phone]) on 03/28/2021 6:42:54 PM  Radiology DG Chest 2 View  Result Date: 03/28/2021 CLINICAL DATA:  Fall.  Right shoulder pain radiating into the chest. EXAM: CHEST - 2 VIEW COMPARISON:  10/31/2014 FINDINGS: Post median sternotomy and CABG. Left-sided pacemaker in place. The heart is enlarged. Lower lung volumes from prior exam. There is a small right pleural effusion with fluid tracking into the minor fissure. Associated right basilar opacity. No pneumothorax. Bronchovascular crowding versus vascular congestion. No acute rib fractures are seen IMPRESSION: 1. Small right pleural effusion with associated right basilar opacity. 2. Cardiomegaly with bronchovascular crowding versus vascular congestion. Electronically Signed    By: Keith Rake M.D.   On: 03/28/2021 17:21   DG Shoulder Right  Result Date: 03/28/2021 CLINICAL DATA:  Post fall with right shoulder pain. EXAM: RIGHT SHOULDER - 2+ VIEW COMPARISON:  None. FINDINGS: There is no evidence of fracture or dislocation. Mild acromioclavicular osteoarthritis. Small subacromial spur. There is superior subluxation of the humeral head. Soft tissues are unremarkable. IMPRESSION: 1. No fracture or dislocation of the right shoulder. 2. Mild acromioclavicular osteoarthritis. Small subacromial spur. 3. Superior subluxation of the humeral head suggests chronic rotator cuff arthropathy. Electronically Signed   By: Keith Rake M.D.   On: 03/28/2021 17:20   DG Hips Bilat W or Wo Pelvis 2 Views  Result Date: 03/28/2021 CLINICAL DATA:  Fall.  Left hip pain. EXAM: DG HIP (WITH OR WITHOUT PELVIS) 2V BILAT COMPARISON:  None. FINDINGS: The cortical margins of the bony pelvis are intact. No fracture. Pubic symphysis and sacroiliac joints are congruent. Both femoral heads are well-seated in the respective acetabula. Moderate bilateral hip osteoarthritis. Bones are diffusely under mineralized. Lobulated soft tissue calcifications lateral to the right hip greater trochanter, may be granulomas or sequela of prior injury. Prominent vascular calcifications. IMPRESSION: 1. No fracture of the pelvis or hips. 2. Moderate bilateral hip osteoarthritis. Electronically Signed   By: Keith Rake M.D.   On: 03/28/2021 17:22    Procedures Procedures    Medications Ordered in ED Medications  furosemide (LASIX) injection 80 mg (80 mg Intravenous Given 03/28/21 1804)    ED Course/ Medical Decision Making/ A&P                           Medical Decision Making  This patient presents to the ED for chief complaint of fall, worsening generalized weakness and sob, this involves an extensive number of treatment options. The differential diagnosis includes electrolyte abnormality, dehydration, fluid  overload with history of CHF, also concern for acute trauma secondary to fall.  Co morbidities that complicate the patient evaluation  History of CHF Ischemic cardiomyopathy Type 2 diabetes Hypertension History of CVA  Additional history obtained:  Additional history obtained from stepson at bedside and the medical record   Lab Tests:  I Ordered, and personally interpreted labs.  The pertinent results include: Per labs include a significant elevation in the BNP level at 2789 indicating acute on chronic CHF.  He is anemic with a hemoglobin of 10.6, this is a macrocytic anemia, less likely secondary to blood loss.  He does appear to be dehydrated with a BUN of 65, his creatinine is 3.54.  His last creatinine to compare was 3.17 obtained 7 months ago.,  Relatively stable, normal potassium level.  Imaging Studies ordered:  I ordered imaging studies including chest x-ray, right shoulder and left pelvis/hip. I independently visualized and interpreted imaging which showed no bony injuries or fracture, no dislocation.  Chest x-ray does show a small right pleural effusion and significant vascular congestion.  I agree with the radiologist interpretation  Cardiac Monitoring:  The patient was maintained on a cardiac monitor.  I personally viewed and interpreted the cardiac monitored which showed an underlying rhythm of: Atrial fibrillation, rate controlled.  This appears to be a new finding.  Medicines ordered and prescription drug management:  I ordered medication including Lasix 80 mg IV for CHF Reevaluation of the patient after these medicines showed that the patient  responses medication pending at this time I have reviewed the patients home medicines and have made adjustments as needed   Critical Interventions:  Patient is significantly fluid overloaded based on exam and BNP result, chest x-ray.  He was started on IV Lasix.  Consultations Obtained:  I requested consultation with Dr.  Denton Brick of [facility],  and discussed lab and imaging findings as well as pertinent plan -she accepts this patient for admission.  Reevaluation:  After the interventions noted above, I reevaluated the patient and found that they have :stayed the same  Social Determinants of Health:  Stepson at bedside also expresses concern about patient's ability to be cared for at home.  He does currently have a daytime care provider that comes in 7 days a week, patient also has hospice that visits 2-3 times per week, however his primary provider is currently working on placement in skilled nursing facility.  The patient himself is very resistant to this idea however.  Disposition:  After consideration of the diagnostic results and the patients response to treatment, I feel that the most appropriate treatment course includes inpatient treatment of his CHF exacerbation.         Final Clinical Impression(s) / ED Diagnoses Final diagnoses:  Acute on chronic congestive heart failure, unspecified heart failure type [facility])  Fall, initial encounter    Rx / DC Orders ED Discharge Orders     None         Landis Martins 03/28/21 1913    Milton Ferguson, MD 03/29/21 1133

## 2021-03-28 NOTE — H&P (Addendum)
History and Physical    SUVAN STCYR VHQ:[id number] DOB: 07-12-1932 DOA: 03/28/2021  PCP: Glenda Chroman, MD   Patient coming from: Home  I have personally briefly reviewed patient's old medical records in [place]  Chief Complaint: Fall  HPI: Clayton Morton is a 88 y.o. male with medical history significant for systolic CHF-cardiomyopathy, AICD status, chronic renal failure,, diabetes mellitus, hypertension, chronic respiratory failure on 2 L. Patient was brought to the ED with reports of a fall.  Patient's step-son is at bedside and assists with the history.  Patient has a hospital bed, was trying to get up when he fell on his left shoulder.  Uses a walker for ambulation.  Over the past 2 weeks he has become more unsteady/shaky on his feet, they deny other falls.  Patient has also had some difficulty breathing recently, with increasing swelling involving his arm and bilateral lower extremities.  He has been compliant with Lasix, over the past week, he was told by his primary care provider, to double his Lasix dose for 3 days.  He took this without significant improvement.  No chest pain.  No black stools no blood in stools or vomiting of blood.  Patient has a caregiver, and hospice care at home.  But family is concerned about patient's increasing dependency and that the level of care he has currently is not enough.  Patient's outpatient provider was trying to arrange placement as an out patient.  ED Course: Temperature 98.  Heart rate 60s to 80s.  Respiratory rate 20-21.  Blood pressure systolic 10 2-1 29.  O2 sats 90 to 100% on room air. BNP markedly elevated at 2789.  Potassium 3.5.  Creatinine 3.54.  EKG suggesting atrial fibrillation. IV Lasix 80 mg x 1 given.  Hospitalist to admit for volume overload, fall.  Review of Systems: As per HPI all other systems reviewed and negative.  Past Medical History:  Diagnosis Date   Age-related macular degeneration, wet, right eye ([place])     AICD (automatic cardioverter/defibrillator) present 10/30/2014   Cerebrovascular accident [facility])    Right subcortical infarct 4132   Chronic systolic congestive heart failure ([place])    a) ECHO (02/2012): EF 30-35%, no obvious thrombus b) ECHO (11/2013): EF 10%, AV mildly calcified, mild MR, RV mod dialted and sys fx sev reduced, TAPSE 0.8, lateral annulus peak S velocity 5.4 c) RHC (11/29/13): RA 15, RV: 65/19, PA 49%, CO/CI: 3.25 / 1.7 d) RHC (12/03/13): RA 1, RV 25/1/2, PA 27/10 (16), PCWP 6, Fick CO/CI: 5.3 / 2.9, PVR 1.9 WU, PA sat 72 and 74%    Coronary atherosclerosis of native coronary artery    1) CABG 2005 2) LHC (910/15): patent SVG to PDA, occlusion of SVG sequence to PLA, patency of LIMA to LAD and patency of SVG to diagonal with sev stenosis at coronary anastomotic site   Diabetes mellitus, type 2 (HCC)    Essential hypertension    History of gout    Hyperlipidemia    Ischemic cardiomyopathy    Kidney stones    Macular degeneration    Dr. [name]   Non-ST elevation MI (NSTEMI) [facility])    2005   Varicose veins     Past Surgical History:  Procedure Laterality Date   CARDIAC CATHETERIZATION     CARDIAC DEFIBRILLATOR PLACEMENT  10/30/2014   CATARACT EXTRACTION W/ INTRAOCULAR LENS  IMPLANT, BILATERAL Bilateral ~ [place]  2005   LIMA to LAD, SVG diagonal, SVG to PDA and PLA   CYSTOSCOPY W/ LITHOLAPAXY / EHL     EP IMPLANTABLE DEVICE N/A 10/30/2014   Procedure: ICD Implant;  Surgeon: Deboraha Sprang, MD, SJM Ellipse DR implanted for primary prevention   LEFT AND RIGHT HEART CATHETERIZATION WITH CORONARY/GRAFT ANGIOGRAM N/A 11/29/2013   Procedure: LEFT AND RIGHT HEART CATHETERIZATION WITH Beatrix Fetters;  Surgeon: Blane Ohara, MD;  Location: [facility] CATH LAB;  Service: Cardiovascular;  Laterality: N/A;   RIGHT HEART CATHETERIZATION N/A 12/03/2013   Procedure: RIGHT HEART CATH;  Surgeon: Jolaine Artist, MD;  Location: [facility] CATH  LAB;  Service: Cardiovascular;  Laterality: N/A;   TONSILLECTOMY       reports that he has never smoked. He has never used smokeless tobacco. He reports that he does not drink alcohol and does not use drugs.  Allergies  Allergen Reactions   Tetracycline     Unknown reaction     Family History  Problem Relation Age of Onset   Hypertension Father        CAD, CHF; deceased at 55   Dementia Mother        deceased at age 44    Prior to Admission medications   Medication Sig Start Date End Date Taking? Authorizing Provider  allopurinol (ZYLOPRIM) 100 MG tablet Take 1 tablet by mouth daily. 12/13/17  Yes [provider]  ALPRAZolam Duanne Moron) 0.5 MG tablet Take 0.5 tablets by mouth daily. 06/30/14  Yes [provider]  aspirin 81 MG tablet Take 81 mg by mouth daily.   Yes [provider]  atorvastatin (LIPITOR) 80 MG tablet take 1 tablet by mouth once daily 06/01/16  Yes Larey Dresser, MD  carvedilol (COREG) 12.5 MG tablet TAKE ONE TABLET BY MOUTH TWICE DAILY. TAKE WITH A MEAL. 11/18/20  Yes Satira Sark, MD  Cholecalciferol (VITAMIN D) 125 MCG (5000 UT) CAPS Take 1 capsule by mouth daily.   Yes [provider]  citalopram (CELEXA) 20 MG tablet Take 20 mg by mouth daily. 03/24/21  Yes [provider]  clonazePAM (KLONOPIN) 0.5 MG tablet Take 0.5 mg by mouth at bedtime. 10/30/20  Yes [provider]  Coenzyme Q10 (CO Q 10 PO) Take 1 tablet by mouth daily.   Yes [provider]  furosemide (LASIX) 20 MG tablet Take 20 mg by mouth 2 (two) times daily.   Yes [provider]  glipiZIDE (GLUCOTROL XL) 10 MG 24 hr tablet Take 10 mg by mouth 2 (two) times daily. 05/22/20  Yes [provider]  hydrALAZINE (APRESOLINE) 50 MG tablet TAKE ONE TABLET BY MOUTH THREE TIMES DAILY 10/13/20  Yes Satira Sark, MD  isosorbide mononitrate (IMDUR) 60 MG 24 hr tablet TAKE ONE TABLET BY MOUTH DAILY 10/10/20  Yes Satira Sark,  MD  Multiple Vitamins-Minerals (ICAPS AREDS FORMULA PO) Take 1 tablet by mouth daily.   Yes [provider]  Omega-3 Fatty Acids (FISH OIL) 1000 MG CAPS Take 1 capsule by mouth 2 (two) times daily.   Yes [provider]  repaglinide (PRANDIN) 1 MG tablet Take 1 mg by mouth daily.   Yes [provider]  rOPINIRole (REQUIP) 0.25 MG tablet Take 0.5 mg by mouth at bedtime. 05/22/18  Yes [provider]  Saw Palmetto 500 MG CAPS Take 1 capsule by mouth daily. 01/02/21  Yes [provider]  SYNTHROID 75 MCG tablet Take 1 tablet by mouth daily. 01/03/18  Yes [provider]  VITAMIN E PO Take 1 tablet by mouth daily.   Yes [provider]  ONE TOUCH ULTRA TEST test strip 1 each by Other route 2 (two) times daily.  09/02/14   [provider]    Physical Exam: Vitals:   03/28/21 1700 03/28/21 1730 03/28/21 1758 03/28/21 1830  BP: 129/77 127/70  121/73  Pulse: 76 71  72  Resp: 16 10  17   Temp:      SpO2: 99% 96%  97%  Weight:   86.6 kg     Constitutional: NAD, calm, comfortable Vitals:   03/28/21 1700 03/28/21 1730 03/28/21 1758 03/28/21 1830  BP: 129/77 127/70  121/73  Pulse: 76 71  72  Resp: 16 10  17   Temp:      SpO2: 99% 96%  97%  Weight:   86.6 kg    Eyes: PERRL, lids and conjunctivae normal ENMT: Mucous membranes are dry Neck: normal, supple, no masses, no thyromegaly Respiratory: clear to auscultation bilaterally, no wheezing, no crackles. Normal respiratory effort. No accessory muscle use.  Lying in bed, at about 30 degree angle appears comfortable. Cardiovascular:  no murmurs / rubs / gallops.  2+ pitting lower extremity edema to knees, with bullae present on right lower extremity, increased swelling on the right upper arm with bullae also, right abdominal wall swelling without tenderness, with slight differential discoloration, apparently patient mostly lies on the right side-hence differential degree of  edema Lower extremities warm and well-perfused Abdomen: no tenderness, no masses palpated. No hepatosplenomegaly. Bowel sounds positive.  Musculoskeletal: no clubbing / cyanosis. No joint deformity upper and lower extremities. Good ROM, no contractures. Normal muscle tone.  Skin: Purpuric bruising to left upper extremity, no ulcers. No induration Neurologic: No apparent cranial nerve abnormality, moving extremities spontaneously. Psychiatric: Normal judgment and insight. Alert and oriented x 3. Normal mood.   Labs on Admission: I have personally reviewed following labs and imaging studies  CBC: Recent Labs  Lab 03/28/21 1623  WBC 7.2  NEUTROABS 6.4  HGB 10.6*  HCT 34.1*  MCV 102.1*  PLT 762*   Basic Metabolic Panel: Recent Labs  Lab 03/28/21 1623  NA 142  K 3.5  CL 102  CO2 28  GLUCOSE 311*  BUN 65*  CREATININE 3.54*  CALCIUM 8.5*   GFR: Estimated Creatinine Clearance: 16 mL/min (A) (by C-G formula based on SCr of 3.54 mg/dL (H)). Liver Function Tests: Recent Labs  Lab 03/28/21 1623  AST 23  ALT 17  ALKPHOS 79  BILITOT 0.6  PROT 5.8*  ALBUMIN 3.1*   Urine analysis:    Component Value Date/Time   COLORURINE YELLOW 11/26/2013 0149   APPEARANCEUR CLEAR 11/26/2013 0149   LABSPEC >1.030 (H) 11/26/2013 0149   PHURINE 5.5 11/26/2013 0149   GLUCOSEU NEGATIVE 11/26/2013 0149   HGBUR SMALL (A) 11/26/2013 0149   BILIRUBINUR NEGATIVE 11/26/2013 0149   KETONESUR NEGATIVE 11/26/2013 0149   PROTEINUR >300 (A) 11/26/2013 0149   UROBILINOGEN 1.0 11/26/2013 0149   NITRITE NEGATIVE 11/26/2013 0149   LEUKOCYTESUR NEGATIVE 11/26/2013 0149    Radiological Exams on Admission: DG Chest 2 View  Result Date: 03/28/2021 CLINICAL DATA:  Fall.  Right shoulder pain radiating into the chest. EXAM: CHEST - 2 VIEW COMPARISON:  10/31/2014 FINDINGS: Post median sternotomy and CABG. Left-sided pacemaker in place. The heart is enlarged. Lower lung volumes from prior exam. There is a  small right pleural effusion with fluid tracking into the minor fissure. Associated  right basilar opacity. No pneumothorax. Bronchovascular crowding versus vascular congestion. No acute rib fractures are seen IMPRESSION: 1. Small right pleural effusion with associated right basilar opacity. 2. Cardiomegaly with bronchovascular crowding versus vascular congestion. Electronically Signed   By: Keith Rake M.D.   On: 03/28/2021 17:21   DG Shoulder Right  Result Date: 03/28/2021 CLINICAL DATA:  Post fall with right shoulder pain. EXAM: RIGHT SHOULDER - 2+ VIEW COMPARISON:  None. FINDINGS: There is no evidence of fracture or dislocation. Mild acromioclavicular osteoarthritis. Small subacromial spur. There is superior subluxation of the humeral head. Soft tissues are unremarkable. IMPRESSION: 1. No fracture or dislocation of the right shoulder. 2. Mild acromioclavicular osteoarthritis. Small subacromial spur. 3. Superior subluxation of the humeral head suggests chronic rotator cuff arthropathy. Electronically Signed   By: Keith Rake M.D.   On: 03/28/2021 17:20   DG Hips Bilat W or Wo Pelvis 2 Views  Result Date: 03/28/2021 CLINICAL DATA:  Fall.  Left hip pain. EXAM: DG HIP (WITH OR WITHOUT PELVIS) 2V BILAT COMPARISON:  None. FINDINGS: The cortical margins of the bony pelvis are intact. No fracture. Pubic symphysis and sacroiliac joints are congruent. Both femoral heads are well-seated in the respective acetabula. Moderate bilateral hip osteoarthritis. Bones are diffusely under mineralized. Lobulated soft tissue calcifications lateral to the right hip greater trochanter, may be granulomas or sequela of prior injury. Prominent vascular calcifications. IMPRESSION: 1. No fracture of the pelvis or hips. 2. Moderate bilateral hip osteoarthritis. Electronically Signed   By: Keith Rake M.D.   On: 03/28/2021 17:22    EKG: Independently reviewed.  Some P waves present, breathing mostly regular, appears to be  in atrial fibrillation.  QTc prolonged at 502.  PVCs present.  Assessment/Plan Principal Problem:   Acute on chronic systolic heart failure (HCC) Active Problems:   Chronic renal failure   RBBB (right bundle branch block)   Ischemic cardiomyopathy  Acute on chronic systolic and diastolic CHF, AICD-  dyspnea, pitting 2+ extremity edema, dependent edema to right abdomen and right upper arm with bullae. Weight gain per charts, 161 in May 2022, today 190.  BNP elevated at 2789.  Last echo 2020 EF 25 to 30% with impaired LV diastolic relaxation.  Reports compliance with Lasix 20 mg twice daily.  EKG suggesting atrial fibrillation. -Chest X-ray shows small right pleural effusion, associated right basilar opacity, bronchovascular crowding versus vascular congestion. -Obtain updated echocardiogram - May benefit from cardiology consultation when available -Trend troponin -IV Lasix 80 given in ED, continue 60 twice daily -Strict input output, daily weights, daily BMP -Replete electrolytes  Acute on chronic renal failure stage 4- mild, creatinine today 3.5, elevation from last check 7 months ago-3.1.  BUN 65.  Likely cardiorenal.   - Monitor with diuresis  Coronary artery disease- History of multivessel disease status post CABG in 2005.  No chest pain.  EKG he appears to be in atrial fibrillation. -Check troponin x2 -Resume aspirin, statins, Imdur  Chronic respiratory failure on 2 L.  O2 sats currently greater than 92% on room air.  Fall-recent increase in unsteady gait, increased risk of falls.  Shoulder x-ray without acute abnormality, suggests chronic rotator cuff arthropathy.  Lives at home, gets hospice care, family looking for more of an inpatient/long-term care. -Physical therapy and TOC evaluation -On low-dose benzos, clonazepam 0.5 nightly, and Ativan 0.25 daily, will resume -Swelling to left abdomen appears to be more of dependent edema, than trauma, monitor -CBC a.m.  Prolonged QTC  502.  Potassium 3.5. -Check magnesium  Diabetes mellitus with hyperglycemia-glucose 311. - SSI- M -Hold glipizide and repaglinide - [id number]  HTN-systolic [phone].. -Monitor with diuresis - resume Coreg, Imdur -Hold hydralazine for now with diuresis  DVT prophylaxis: heparin Code Status: DNR-confirmed with patient and patient's Edgardo Roys at bedside. Family Communication: Edgardo Roys at bedside.  Patient's spouse is also in the ED-her disposition to be determined, family hoping she will be admitted to inpt hospice care. Disposition Plan: ~ 2 days Consults called:  None Admission status: Inpt, telemetry I certify that at the point of admission it is my clinical judgment that the patient will require inpatient hospital care spanning beyond 2 midnights from the point of admission due to high intensity of service, high risk for further deterioration and high frequency of surveillance required.  Bethena Roys MD [facility]  03/28/2021, 8:11 PM

## 2021-03-28 NOTE — ED Notes (Signed)
Pt given urinal and educated on how to use the call light for assistance. Pt verbalized understanding.

## 2021-03-28 NOTE — Plan of Care (Signed)
  Problem: Education: Goal: Knowledge of General Education information will improve Description: Including pain rating scale, medication(s)/side effects and non-pharmacologic comfort measures Outcome: Progressing   Problem: Clinical Measurements: Goal: Ability to maintain clinical measurements within normal limits will improve Outcome: Progressing   

## 2021-03-28 NOTE — ED Triage Notes (Signed)
Fell at home this morning, pain in right shoulder

## 2021-03-28 NOTE — Progress Notes (Signed)
Date and time results received: 03/28/21 8:39 PM  (use smartphrase ".now" to insert current time)  Test: Troponin Critical Value: 201  Name of Provider Notified: Dr. Carlynn Purl  Orders Received? Or Actions Taken?: awaiting orders

## 2021-03-29 ENCOUNTER — Inpatient Hospital Stay (HOSPITAL_COMMUNITY)

## 2021-03-29 DIAGNOSIS — E8770 Fluid overload, unspecified: Secondary | ICD-10-CM | POA: Diagnosis not present

## 2021-03-29 DIAGNOSIS — W19XXXA Unspecified fall, initial encounter: Secondary | ICD-10-CM | POA: Diagnosis not present

## 2021-03-29 DIAGNOSIS — N184 Chronic kidney disease, stage 4 (severe): Secondary | ICD-10-CM | POA: Diagnosis not present

## 2021-03-29 DIAGNOSIS — I509 Heart failure, unspecified: Secondary | ICD-10-CM

## 2021-03-29 DIAGNOSIS — R9431 Abnormal electrocardiogram [ECG] [EKG]: Secondary | ICD-10-CM

## 2021-03-29 DIAGNOSIS — I5043 Acute on chronic combined systolic (congestive) and diastolic (congestive) heart failure: Secondary | ICD-10-CM | POA: Diagnosis not present

## 2021-03-29 LAB — ECHOCARDIOGRAM COMPLETE
AR max vel: 1.21 cm2
AV Area VTI: 1.31 cm2
AV Area mean vel: 1.2 cm2
AV Mean grad: 9.5 mmHg
AV Peak grad: 19.3 mmHg
Ao pk vel: 2.2 m/s
Area-P 1/2: 3.5 cm2
Calc EF: 24.6 %
Height: 68 in
MV VTI: 3.14 cm2
S' Lateral: 4.7 cm
Single Plane A2C EF: 23.4 %
Single Plane A4C EF: 25.6 %
Weight: 3082.91 oz

## 2021-03-29 LAB — CBC
HCT: 31 % — ABNORMAL LOW (ref 39.0–52.0)
Hemoglobin: 9.8 g/dL — ABNORMAL LOW (ref 13.0–17.0)
MCH: 31.5 pg (ref 26.0–34.0)
MCHC: 31.6 g/dL (ref 30.0–36.0)
MCV: 99.7 fL (ref 80.0–100.0)
Platelets: 120 10*3/uL — ABNORMAL LOW (ref 150–400)
RBC: 3.11 MIL/uL — ABNORMAL LOW (ref 4.22–5.81)
RDW: 17.2 % — ABNORMAL HIGH (ref 11.5–15.5)
WBC: 5.5 10*3/uL (ref 4.0–10.5)
nRBC: 0 % (ref 0.0–0.2)

## 2021-03-29 LAB — BASIC METABOLIC PANEL
Anion gap: 12 (ref 5–15)
BUN: 67 mg/dL — ABNORMAL HIGH (ref 8–23)
CO2: 29 mmol/L (ref 22–32)
Calcium: 8.5 mg/dL — ABNORMAL LOW (ref 8.9–10.3)
Chloride: 104 mmol/L (ref 98–111)
Creatinine, Ser: 3.48 mg/dL — ABNORMAL HIGH (ref 0.61–1.24)
GFR, Estimated: 16 mL/min — ABNORMAL LOW (ref >60)
Glucose, Bld: 195 mg/dL — ABNORMAL HIGH (ref 70–99)
Potassium: 3.3 mmol/L — ABNORMAL LOW (ref 3.5–5.1)
Sodium: 145 mmol/L (ref 135–145)

## 2021-03-29 LAB — TROPONIN I (HIGH SENSITIVITY)
Troponin I (High Sensitivity): 222 ng/L (ref <18)
Troponin I (High Sensitivity): 219 ng/L (ref <18)
Troponin I (High Sensitivity): 226 ng/L (ref <18)

## 2021-03-29 LAB — HEMOGLOBIN A1C
Hgb A1c MFr Bld: 9 % — ABNORMAL HIGH (ref 4.8–5.6)
Mean Plasma Glucose: 211.6 mg/dL

## 2021-03-29 LAB — GLUCOSE, CAPILLARY
Glucose-Capillary: 172 mg/dL — ABNORMAL HIGH (ref 70–99)
Glucose-Capillary: 251 mg/dL — ABNORMAL HIGH (ref 70–99)
Glucose-Capillary: 132 mg/dL — ABNORMAL HIGH (ref 70–99)
Glucose-Capillary: 94 mg/dL (ref 70–99)

## 2021-03-29 MED ORDER — PERFLUTREN LIPID MICROSPHERE
1.0000 mL | INTRAVENOUS | Status: AC | PRN
  Administered 2021-03-29: 6 mL via INTRAVENOUS
  Filled 2021-03-29: qty 10

## 2021-03-29 MED ORDER — POTASSIUM CHLORIDE CRYS ER 20 MEQ PO TBCR
40.0000 meq | EXTENDED_RELEASE_TABLET | Freq: Once | ORAL | Status: AC
  Administered 2021-03-29: 40 meq via ORAL

## 2021-03-29 NOTE — Progress Notes (Signed)
PROGRESS NOTE    Clayton Morton  PYP:[id number] DOB: 29-Dec-1932 DOA: 03/28/2021 PCP: Glenda Chroman, MD     Brief Narrative:  88 y.o. WM PMHx  CHF-cardiomyopathy, AICD status, chronic renal failure,, diabetes mellitus, hypertension, chronic respiratory failure on 2 L.   Brought to the ED with reports of a fall.  Patient's step-son is at bedside and assists with the history.  Patient has a hospital bed, was trying to get up when he fell on his left shoulder.  Uses a walker for ambulation.  Over the past 2 weeks he has become more unsteady/shaky on his feet, they deny other falls.  Patient has also had some difficulty breathing recently, with increasing swelling involving his arm and bilateral lower extremities.  He has been compliant with Lasix, over the past week, he was told by his primary care provider, to double his Lasix dose for 3 days.  He took this without significant improvement.  No chest pain.  No black stools no blood in stools or vomiting of blood.   Patient has a caregiver, and hospice care at home.  But family is concerned about patient's increasing dependency and that the level of care he has currently is not enough.  Patient's outpatient provider was trying to arrange placement as an out patient.   ED Course: Temperature 98.  Heart rate 60s to 80s.  Respiratory rate 20-21.  Blood pressure systolic 10 2-1 29.  O2 sats 90 to 100% on room air. BNP markedly elevated at 2789.  Potassium 3.5.  Creatinine 3.54.  EKG suggesting atrial fibrillation. IV Lasix 80 mg x 1 given.  Hospitalist to admit for volume overload, fall.   Subjective: Afebrile overnight, alert follows commands.  Dysarthria unable to understand patient's speech.  Negative CP, negative SOB   Assessment & Plan: Covid vaccination;   Principal Problem:   Acute on chronic combined systolic and diastolic CHF (congestive heart failure) (HCC) Active Problems:   Coronary atherosclerosis of native coronary artery    Chronic renal failure   RBBB (right bundle branch block)   Ischemic cardiomyopathy  Acute on chronic systolic and diastolic CHF, AICD-   -Dyspnea, resolved - Pitting 2+ extremity edema, dependent edema to right abdomen and right upper arm with bullae. Weight gain per charts, 161 in May 2022, today 190.  BNP elevated at 2789.  Last echo 2020 EF 25 to 30% with impaired LV diastolic relaxation.  Reports compliance with Lasix 20 mg twice daily.  EKG suggesting atrial fibrillation. -Chest X-ray shows small right pleural effusion, associated right basilar opacity, bronchovascular crowding versus vascular congestion.  Latest Reference Range & Units 03/28/21 16:23 03/28/21 21:15  Troponin I (High Sensitivity) <18 ng/L 201 (HH) 248 (HH)  (HH): Data is critically high -1/8 trend troponin -1/8 Echocardiogram pending -Coreg 12.5 mg BID -Lasix 60 mg BID -Imdur 60 mg daily -Strict in and out -450ml - Daily weight  Essential HTN -See CHF  Coronary artery disease-  -History of multivessel disease status post CABG in 2005.  No chest pain.  EKG he appears to be in atrial fibrillation. -Resume aspirin, statins, Imdur  Prolonged QTC  -QTC 502.   -Hold QT prolonging medication - Ensure electrolytes WNL   Acute on chronic renal failure stage 4-(baseline Cr 3.17 months ago) -Likely cardiorenal.   Lab Results  Component Value Date   CREATININE 3.48 (H) 03/29/2021   CREATININE 3.54 (H) 03/28/2021   CREATININE 3.17 (H) 08/01/2020   CREATININE 2.51 (H) 04/20/2016  CREATININE 1.99 (H) 08/05/2015  - Monitor with diuresis     Chronic respiratory failure  -Baseline on 2 L O2 via Brenda - Titrate O2 to maintain SPO2> 92%.   Fall- -Recent increase in unsteady gait, increased risk of falls.  Shoulder x-ray without acute abnormality, suggests chronic rotator cuff arthropathy.  Lives at home, gets hospice care, family looking for more of an inpatient/long-term care. -Physical therapy and TOC  evaluation -On low-dose benzos, clonazepam 0.5 nightly, and Ativan 0.25 daily, will resume -Swelling to left abdomen appears to be more of dependent edema, than trauma, monitor -CBC a.m. -Patient on home hospice per admission note both myself and TOC team unsure of why patient was admitted.  Transition to inpatient hospice? - 1/8 NCM is investigating into if this is the case, given that wife is also transitioning into inpatient hospice.     DM type II uncontrolled with hyperglycemia -1/7 hemoglobin A1c= 9.0 - Moderate SSI    Hypokalemia - Potassium goal> 4 - 1/8 K-Dur 40 mEq  RIGHT buttocks ulcer stage II Pressure Injury 03/28/21 Buttocks Right Stage 2 -  Partial thickness loss of dermis presenting as a shallow open injury with a red, pink wound bed without slough. stage 2 open area on inner buttocks (Active)  03/28/21 2000  Location: Buttocks  Location Orientation: Right  Staging: Stage 2 -  Partial thickness loss of dermis presenting as a shallow open injury with a red, pink wound bed without slough.  Wound Description (Comments): stage 2 open area on inner buttocks  Present on Admission: Yes  -Ensure frequent turning       DVT prophylaxis: Subcu heparin Code Status: DNR (per note on home hospice?) Family Communication:  Status is: Inpatient    Dispo: The patient is from: Home              Anticipated d/c is to:  Residential hospice?  We will await NCM findings and recommendation.              Anticipated d/c date is: 2 days              Patient currently is not medically stable to d/c.      Consultants:    Procedures/Significant Events:    I have personally reviewed and interpreted all radiology studies and my findings are as above.  VENTILATOR SETTINGS: Room air 1/8 SPO2 92%   Cultures   Antimicrobials: Anti-infectives (From admission, onward)    None         Devices    LINES / TUBES:      Continuous  Infusions:   Objective: Vitals:   03/28/21 2030 03/29/21 0048 03/29/21 0500 03/29/21 0543  BP: 113/76 111/68  116/65  Pulse: 73 64  81  Resp: 18 19  20   Temp: 97.6 F (36.4 C) 98 F (36.7 C)  98.7 F (37.1 C)  TempSrc: Oral Oral  Oral  SpO2: 100% 99%  92%  Weight: 87.4 kg  87.4 kg   Height: 5\' 8"  (1.727 m)  5\' 8"  (1.727 m)     Intake/Output Summary (Last 24 hours) at 03/29/2021 0914 Last data filed at 03/29/2021 8119 Gross per 24 hour  Intake --  Output 150 ml  Net -150 ml   Filed Weights   03/28/21 1758 03/28/21 2030 03/29/21 0500  Weight: 86.6 kg 87.4 kg 87.4 kg    Examination:  General: Alert, follows commands No acute respiratory distress Eyes: negative scleral hemorrhage, negative anisocoria, negative icterus  ENT: Negative Runny nose, negative gingival bleeding, Neck:  Negative scars, masses, torticollis, lymphadenopathy, JVD Lungs: Clear to auscultation bilaterally without wheezes or crackles Cardiovascular: Regular rate and rhythm without murmur gallop or rub normal S1 and S2 Abdomen: negative abdominal pain, nondistended, positive soft, bowel sounds, no rebound, no ascites, no appreciable mass Extremities: No significant cyanosis, clubbing, 2+ bilateral lower extremity edema to hip Skin: Negative rashes, lesions, ulcers Psychiatric: To evaluate secondary to patient's dysarthria  Central nervous system:  Cranial nerves II through XII intact, tongue/uvula midline, all extremities muscle strength 5/5, sensation intact throughout, positive dysarthria, positive expressive aphasia, negative receptive aphasia.  .     Data Reviewed: Care during the described time interval was provided by me .  I have reviewed this patient's available data, including medical history, events of note, physical examination, and all test results as part of my evaluation.  CBC: Recent Labs  Lab 03/28/21 1623 03/29/21 0532  WBC 7.2 5.5  NEUTROABS 6.4  --   HGB 10.6* 9.8*  HCT 34.1*  31.0*  MCV 102.1* 99.7  PLT 122* 408*   Basic Metabolic Panel: Recent Labs  Lab 03/28/21 1623 03/29/21 0532  NA 142 145  K 3.5 3.3*  CL 102 104  CO2 28 29  GLUCOSE 311* 195*  BUN 65* 67*  CREATININE 3.54* 3.48*  CALCIUM 8.5* 8.5*  MG 2.4  --    GFR: Estimated Creatinine Clearance: 16.4 mL/min (A) (by C-G formula based on SCr of 3.48 mg/dL (H)). Liver Function Tests: Recent Labs  Lab 03/28/21 1623  AST 23  ALT 17  ALKPHOS 79  BILITOT 0.6  PROT 5.8*  ALBUMIN 3.1*   No results for input(s): LIPASE, AMYLASE in the last 168 hours. No results for input(s): AMMONIA in the last 168 hours. Coagulation Profile: No results for input(s): INR, PROTIME in the last 168 hours. Cardiac Enzymes: No results for input(s): CKTOTAL, CKMB, CKMBINDEX, TROPONINI in the last 168 hours. BNP (last 3 results) No results for input(s): PROBNP in the last 8760 hours. HbA1C: No results for input(s): HGBA1C in the last 72 hours. CBG: Recent Labs  Lab 03/28/21 2036 03/29/21 0736  GLUCAP 244* 172*   Lipid Profile: No results for input(s): CHOL, HDL, LDLCALC, TRIG, CHOLHDL, LDLDIRECT in the last 72 hours. Thyroid Function Tests: No results for input(s): TSH, T4TOTAL, FREET4, T3FREE, THYROIDAB in the last 72 hours. Anemia Panel: No results for input(s): VITAMINB12, FOLATE, FERRITIN, TIBC, IRON, RETICCTPCT in the last 72 hours. Sepsis Labs: No results for input(s): PROCALCITON, LATICACIDVEN in the last 168 hours.  Recent Results (from the past 240 hour(s))  Resp Panel by RT-PCR (Flu A&B, Covid) Nasopharyngeal Swab     Status: None   Collection Time: 03/28/21  4:03 PM   Specimen: Nasopharyngeal Swab; Nasopharyngeal(NP) swabs in vial transport medium  Result Value Ref Range Status   SARS Coronavirus 2 by RT PCR NEGATIVE NEGATIVE Final    Comment: (NOTE) SARS-CoV-2 target nucleic acids are NOT DETECTED.  The SARS-CoV-2 RNA is generally detectable in upper respiratory specimens during the  acute phase of infection. The lowest concentration of SARS-CoV-2 viral copies this assay can detect is 138 copies/mL. A negative result does not preclude SARS-Cov-2 infection and should not be used as the sole basis for treatment or other patient management decisions. A negative result may occur with  improper specimen collection/handling, submission of specimen other than nasopharyngeal swab, presence of viral mutation(s) within the areas targeted by this assay, and inadequate number of  viral copies(<138 copies/mL). A negative result must be combined with clinical observations, patient history, and epidemiological information. The expected result is Negative.  Fact Sheet for Patients:  EntrepreneurPulse.com.au  Fact Sheet for Healthcare Providers:  IncredibleEmployment.be  This test is no t yet approved or cleared by the [place] FDA and  has been authorized for detection and/or diagnosis of SARS-CoV-2 by FDA under an Emergency Use Authorization (EUA). This EUA will remain  in effect (meaning this test can be used) for the duration of the COVID-19 declaration under Section 564(b)(1) of the Act, 21 U.S.C.section 360bbb-3(b)(1), unless the authorization is terminated  or revoked sooner.       Influenza A by PCR NEGATIVE NEGATIVE Final   Influenza B by PCR NEGATIVE NEGATIVE Final    Comment: (NOTE) The Xpert Xpress SARS-CoV-2/FLU/RSV plus assay is intended as an aid in the diagnosis of influenza from Nasopharyngeal swab specimens and should not be used as a sole basis for treatment. Nasal washings and aspirates are unacceptable for Xpert Xpress SARS-CoV-2/FLU/RSV testing.  Fact Sheet for Patients: EntrepreneurPulse.com.au  Fact Sheet for Healthcare Providers: IncredibleEmployment.be  This test is not yet approved or cleared by the [place] FDA and has been authorized for detection and/or  diagnosis of SARS-CoV-2 by FDA under an Emergency Use Authorization (EUA). This EUA will remain in effect (meaning this test can be used) for the duration of the COVID-19 declaration under Section 564(b)(1) of the Act, 21 U.S.C. section 360bbb-3(b)(1), unless the authorization is terminated or revoked.  Performed at [facility], [address] [place], [place] [address]          Radiology Studies: DG Chest 2 View  Result Date: 03/28/2021 CLINICAL DATA:  Fall.  Right shoulder pain radiating into the chest. EXAM: CHEST - 2 VIEW COMPARISON:  10/31/2014 FINDINGS: Post median sternotomy and CABG. Left-sided pacemaker in place. The heart is enlarged. Lower lung volumes from prior exam. There is a small right pleural effusion with fluid tracking into the minor fissure. Associated right basilar opacity. No pneumothorax. Bronchovascular crowding versus vascular congestion. No acute rib fractures are seen IMPRESSION: 1. Small right pleural effusion with associated right basilar opacity. 2. Cardiomegaly with bronchovascular crowding versus vascular congestion. Electronically Signed   By: Keith Rake M.D.   On: 03/28/2021 17:21   DG Shoulder Right  Result Date: 03/28/2021 CLINICAL DATA:  Post fall with right shoulder pain. EXAM: RIGHT SHOULDER - 2+ VIEW COMPARISON:  None. FINDINGS: There is no evidence of fracture or dislocation. Mild acromioclavicular osteoarthritis. Small subacromial spur. There is superior subluxation of the humeral head. Soft tissues are unremarkable. IMPRESSION: 1. No fracture or dislocation of the right shoulder. 2. Mild acromioclavicular osteoarthritis. Small subacromial spur. 3. Superior subluxation of the humeral head suggests chronic rotator cuff arthropathy. Electronically Signed   By: Keith Rake M.D.   On: 03/28/2021 17:20   DG Hips Bilat W or Wo Pelvis 2 Views  Result Date: 03/28/2021 CLINICAL DATA:  Fall.  Left hip pain. EXAM: DG HIP (WITH OR WITHOUT PELVIS) 2V  BILAT COMPARISON:  None. FINDINGS: The cortical margins of the bony pelvis are intact. No fracture. Pubic symphysis and sacroiliac joints are congruent. Both femoral heads are well-seated in the respective acetabula. Moderate bilateral hip osteoarthritis. Bones are diffusely under mineralized. Lobulated soft tissue calcifications lateral to the right hip greater trochanter, may be granulomas or sequela of prior injury. Prominent vascular calcifications. IMPRESSION: 1. No fracture of the pelvis or hips. 2. Moderate bilateral hip  osteoarthritis. Electronically Signed   By: Keith Rake M.D.   On: 03/28/2021 17:22        Scheduled Meds:  allopurinol  100 mg Oral Daily   ALPRAZolam  0.25 mg Oral Daily   aspirin  81 mg Oral Daily   atorvastatin  80 mg Oral q1800   carvedilol  12.5 mg Oral BID WC   citalopram  20 mg Oral Daily   clonazePAM  0.5 mg Oral QHS   furosemide  60 mg Intravenous Q12H   heparin  5,000 Units Subcutaneous Q8H   insulin aspart  0-15 Units Subcutaneous TID WC   insulin aspart  0-5 Units Subcutaneous QHS   isosorbide mononitrate  60 mg Oral Daily   levothyroxine  75 mcg Oral Daily   rOPINIRole  0.5 mg Oral QHS   Continuous Infusions:   LOS: 1 day    Time spent:40 min    Ilina Xu, Geraldo Docker, MD [facility]   If 7PM-7AM, please contact night-coverage 03/29/2021, 9:14 AM

## 2021-03-29 NOTE — Progress Notes (Signed)
*  PRELIMINARY RESULTS* Echocardiogram 2D Echocardiogram has been performed.  Clayton Morton 03/29/2021, 10:22 AM

## 2021-03-30 ENCOUNTER — Encounter

## 2021-03-30 DIAGNOSIS — I255 Ischemic cardiomyopathy: Secondary | ICD-10-CM | POA: Diagnosis not present

## 2021-03-30 DIAGNOSIS — I25119 Atherosclerotic heart disease of native coronary artery with unspecified angina pectoris: Secondary | ICD-10-CM

## 2021-03-30 DIAGNOSIS — N184 Chronic kidney disease, stage 4 (severe): Secondary | ICD-10-CM

## 2021-03-30 DIAGNOSIS — I5043 Acute on chronic combined systolic (congestive) and diastolic (congestive) heart failure: Secondary | ICD-10-CM | POA: Diagnosis not present

## 2021-03-30 DIAGNOSIS — L8990 Pressure ulcer of unspecified site, unspecified stage: Secondary | ICD-10-CM | POA: Insufficient documentation

## 2021-03-30 LAB — COMPREHENSIVE METABOLIC PANEL
ALT: 15 U/L (ref 0–44)
AST: 14 U/L — ABNORMAL LOW (ref 15–41)
Albumin: 2.9 g/dL — ABNORMAL LOW (ref 3.5–5.0)
Alkaline Phosphatase: 56 U/L (ref 38–126)
Anion gap: 13 (ref 5–15)
BUN: 73 mg/dL — ABNORMAL HIGH (ref 8–23)
CO2: 27 mmol/L (ref 22–32)
Calcium: 8.4 mg/dL — ABNORMAL LOW (ref 8.9–10.3)
Chloride: 103 mmol/L (ref 98–111)
Creatinine, Ser: 3.88 mg/dL — ABNORMAL HIGH (ref 0.61–1.24)
GFR, Estimated: 14 mL/min — ABNORMAL LOW (ref >60)
Glucose, Bld: 95 mg/dL (ref 70–99)
Potassium: 3.3 mmol/L — ABNORMAL LOW (ref 3.5–5.1)
Sodium: 143 mmol/L (ref 135–145)
Total Bilirubin: 0.9 mg/dL (ref 0.3–1.2)
Total Protein: 5.3 g/dL — ABNORMAL LOW (ref 6.5–8.1)

## 2021-03-30 LAB — CBC WITH DIFFERENTIAL/PLATELET
Abs Immature Granulocytes: 0.04 10*3/uL (ref 0.00–0.07)
Basophils Absolute: 0 10*3/uL (ref 0.0–0.1)
Basophils Relative: 1 %
Eosinophils Absolute: 0.1 10*3/uL (ref 0.0–0.5)
Eosinophils Relative: 1 %
HCT: 32 % — ABNORMAL LOW (ref 39.0–52.0)
Hemoglobin: 9.9 g/dL — ABNORMAL LOW (ref 13.0–17.0)
Immature Granulocytes: 1 %
Lymphocytes Relative: 10 %
Lymphs Abs: 0.5 10*3/uL — ABNORMAL LOW (ref 0.7–4.0)
MCH: 31.2 pg (ref 26.0–34.0)
MCHC: 30.9 g/dL (ref 30.0–36.0)
MCV: 100.9 fL — ABNORMAL HIGH (ref 80.0–100.0)
Monocytes Absolute: 0.5 10*3/uL (ref 0.1–1.0)
Monocytes Relative: 9 %
Neutro Abs: 4.1 10*3/uL (ref 1.7–7.7)
Neutrophils Relative %: 78 %
Platelets: 120 10*3/uL — ABNORMAL LOW (ref 150–400)
RBC: 3.17 MIL/uL — ABNORMAL LOW (ref 4.22–5.81)
RDW: 17.7 % — ABNORMAL HIGH (ref 11.5–15.5)
WBC: 5.2 10*3/uL (ref 4.0–10.5)
nRBC: 0 % (ref 0.0–0.2)

## 2021-03-30 LAB — GLUCOSE, CAPILLARY
Glucose-Capillary: 89 mg/dL (ref 70–99)
Glucose-Capillary: 207 mg/dL — ABNORMAL HIGH (ref 70–99)
Glucose-Capillary: 218 mg/dL — ABNORMAL HIGH (ref 70–99)
Glucose-Capillary: 139 mg/dL — ABNORMAL HIGH (ref 70–99)

## 2021-03-30 LAB — MAGNESIUM: Magnesium: 2.5 mg/dL — ABNORMAL HIGH (ref 1.7–2.4)

## 2021-03-30 LAB — PHOSPHORUS: Phosphorus: 4.3 mg/dL (ref 2.5–4.6)

## 2021-03-30 NOTE — Evaluation (Signed)
Physical Therapy Evaluation Patient Details Name: Clayton Morton MRN: [id number] DOB: June 25, 1932 Today's Date: 03/30/2021  History of Present Illness  Clayton Morton is a 88 y.o. male with medical history significant for systolic CHF-cardiomyopathy, AICD status, chronic renal failure,, diabetes mellitus, hypertension, chronic respiratory failure on 2 L.  Patient was brought to the ED with reports of a fall.  Patient's step-son is at bedside and assists with the history.  Patient has a hospital bed, was trying to get up when he fell on his left shoulder.  Uses a walker for ambulation.  Over the past 2 weeks he has become more unsteady/shaky on his feet, they deny other falls.  Patient has also had some difficulty breathing recently, with increasing swelling involving his arm and bilateral lower extremities.  He has been compliant with Lasix, over the past week, he was told by his primary care provider, to double his Lasix dose for 3 days.  He took this without significant improvement.  No chest pain.  No black stools no blood in stools or vomiting of blood.   Clinical Impression  Patient demonstrates slow labored movement for sitting up at bedside requiring Mod assist to complete supine to sitting, very unsteady on feet and limited to a few steps at bedside before requesting to sit due fatigue and fall risk.  Patient tolerated sitting up in chair after therapy with visitor in room - nurse notified.  Patient will benefit from continued skilled physical therapy in hospital and recommended venue below to increase strength, balance, endurance for safe ADLs and gait.          Recommendations for follow up therapy are one component of a multi-disciplinary discharge planning process, led by the attending physician.  Recommendations may be updated based on patient status, additional functional criteria and insurance authorization.  Follow Up Recommendations Skilled nursing-short term rehab (<3 hours/day)     Assistance Recommended at Discharge Intermittent Supervision/Assistance  Patient can return home with the following  A lot of help with bathing/dressing/bathroom;A lot of help with walking and/or transfers    Equipment Recommendations None recommended by PT  Recommendations for Other Services       Functional Status Assessment Patient has had a recent decline in their functional status and demonstrates the ability to make significant improvements in function in a reasonable and predictable amount of time.     Precautions / Restrictions Precautions Precautions: Fall Restrictions Weight Bearing Restrictions: No      Mobility  Bed Mobility Overal bed mobility: Needs Assistance Bed Mobility: Supine to Sit     Supine to sit: Min assist;Mod assist     General bed mobility comments: increased time, labored movement    Transfers Overall transfer level: Needs assistance Equipment used: Rolling walker (2 wheels) Transfers: Sit to/from Stand;Bed to chair/wheelchair/BSC Sit to Stand: Min assist;Mod assist   Step pivot transfers: Min assist;Mod assist       General transfer comment: unsteady slow labored movement    Ambulation/Gait Ambulation/Gait assistance: Mod assist Gait Distance (Feet): 5 Feet Assistive device: Rolling walker (2 wheels) Gait Pattern/deviations: Decreased step length - left;Decreased stance time - right;Decreased stride length;Trunk flexed Gait velocity: decreased     General Gait Details: limited to a few slow labored steps at bedside before having to sit due to c/o fatigue  Stairs            Wheelchair Mobility    Modified Rankin (Stroke Patients Only)       Balance  Overall balance assessment: Needs assistance Sitting-balance support: Feet supported;No upper extremity supported Sitting balance-Leahy Scale: Fair Sitting balance - Comments: fair/good seated at EOB   Standing balance support: Reliant on assistive device for  balance;During functional activity;Bilateral upper extremity supported Standing balance-Leahy Scale: Poor Standing balance comment: fair/poor using RW                             Pertinent Vitals/Pain Pain Assessment: Faces Faces Pain Scale: Hurts a little bit Pain Location: right shoulder Pain Descriptors / Indicators: Sore Pain Intervention(s): Limited activity within patient's tolerance;Monitored during session;Repositioned    Home Living Family/patient expects to be discharged to:: Private residence Living Arrangements: Spouse/significant other Available Help at Discharge: Family;Available PRN/intermittently Type of Home: House Home Access: Stairs to enter Entrance Stairs-Rails: Right;Left;Can reach both Entrance Stairs-Number of Steps: 1   Home Layout: One level Home Equipment: Conservation officer, nature (2 wheels);Wheelchair - manual      Prior Function Prior Level of Function : Needs assist       Physical Assist : Mobility (physical) Mobility (physical): Bed mobility;Transfers;Gait;Stairs   Mobility Comments: Household ambulator using RW ADLs Comments: assisted by home aides and family, spouse unable to assist     Hand Dominance        Extremity/Trunk Assessment   Upper Extremity Assessment Upper Extremity Assessment: Generalized weakness    Lower Extremity Assessment Lower Extremity Assessment: Generalized weakness    Cervical / Trunk Assessment Cervical / Trunk Assessment: Kyphotic  Communication   Communication: No difficulties  Cognition Arousal/Alertness: Awake/alert Behavior During Therapy: WFL for tasks assessed/performed Overall Cognitive Status: Within Functional Limits for tasks assessed                                          General Comments      Exercises     Assessment/Plan    PT Assessment Patient needs continued PT services  PT Problem List Decreased strength;Decreased activity tolerance;Decreased  balance;Decreased mobility       PT Treatment Interventions DME instruction;Gait training;Stair training;Functional mobility training;Therapeutic activities;Therapeutic exercise;Patient/family education;Balance training    PT Goals (Current goals can be found in the Care Plan section)  Acute Rehab PT Goals Patient Stated Goal: return home with family, caregivers to assist PT Goal Formulation: With patient Time For Goal Achievement: 04/13/21 Potential to Achieve Goals: Good    Frequency Min 3X/week     Co-evaluation               AM-PAC PT "6 Clicks" Mobility  Outcome Measure Help needed turning from your back to your side while in a flat bed without using bedrails?: A Little Help needed moving from lying on your back to sitting on the side of a flat bed without using bedrails?: A Lot Help needed moving to and from a bed to a chair (including a wheelchair)?: A Lot Help needed standing up from a chair using your arms (e.g., wheelchair or bedside chair)?: A Little Help needed to walk in hospital room?: A Lot Help needed climbing 3-5 steps with a railing? : A Lot 6 Click Score: 14    End of Session   Activity Tolerance: Patient tolerated treatment well;Patient limited by fatigue Patient left: in chair;with call bell/phone within reach;with family/visitor present Nurse Communication: Mobility status PT Visit Diagnosis: Unsteadiness on feet (R26.81);Other abnormalities of gait  and mobility (R26.89);Muscle weakness (generalized) (M62.81)    Time: 0932-6712 PT Time Calculation (min) (ACUTE ONLY): 20 min   Charges:   PT Evaluation $PT Eval Moderate Complexity: 1 Mod PT Treatments $Therapeutic Activity: 8-22 mins        12:30 PM, 03/30/21 Lonell Grandchild, MPT Physical Therapist with [facility] [phone] office [phone] mobile phone

## 2021-03-30 NOTE — Progress Notes (Signed)
PROGRESS NOTE   Clayton Morton  OZD:[id number] DOB: 08/26/1932 DOA: 03/28/2021 PCP: Glenda Chroman, MD   Brief Narrative:  88 y.o. WM PMHx  CHF-cardiomyopathy, AICD status, chronic renal failure,, diabetes mellitus, hypertension, chronic respiratory failure on 2 L.  Brought to the ED with reports of a fall.  Patient's step-son is at bedside and assists with the history.  Patient has a hospital bed, was trying to get up when he fell on his left shoulder.  Uses a walker for ambulation.  Over the past 2 weeks he has become more unsteady/shaky on his feet, they deny other falls.  Patient has also had some difficulty breathing recently, with increasing swelling involving his arm and bilateral lower extremities.  He has been compliant with Lasix, over the past week, he was told by his primary care provider, to double his Lasix dose for 3 days.  He took this without significant improvement.  No chest pain.  No black stools no blood in stools or vomiting of blood.   Patient has a caregiver, and hospice care at home.  But family is concerned about patient's increasing dependency and that the level of care he has currently is not enough.  Patient's outpatient provider was trying to arrange placement as an out patient.   ED Course: Temperature 98.  Heart rate 60s to 80s.  Respiratory rate 20-21.  Blood pressure systolic 10 2-1 29.  O2 sats 90 to 100% on room air. BNP markedly elevated at 2789.  Potassium 3.5.  Creatinine 3.54.  EKG suggesting atrial fibrillation. IV Lasix 80 mg x 1 given.  Hospitalist to admit for volume overload, fall.  Subjective: Left foot pain, resolved now, intermittent numbness in right side. No SOB, no CP.   Assessment & Plan: Covid vaccination;   Principal Problem:   Acute on chronic combined systolic and diastolic CHF (congestive heart failure) (HCC) Active Problems:   Coronary atherosclerosis of native coronary artery   Chronic renal failure   RBBB (right bundle branch  block)   Ischemic cardiomyopathy   Pressure injury of skin  Acute on chronic systolic and diastolic CHF, AICD-   -Dyspnea, resolved - Pitting 2+ extremity edema, dependent edema to right abdomen and right upper arm with bullae. Weight gain per charts, 161 in May 2022, today 190.  BNP elevated at 2789.  Last echo 2020 EF 25 to 30% with impaired LV diastolic relaxation.  Reports compliance with Lasix 20 mg twice daily.  EKG suggesting atrial fibrillation. -Chest X-ray shows small right pleural effusion, associated right basilar opacity, bronchovascular crowding versus vascular congestion.  Latest Reference Range & Units 03/28/21 16:23 03/28/21 21:15  Troponin I (High Sensitivity) <18 ng/L 201 (HH) 248 (HH)  (HH): Data is critically high -1/8 trend troponin -1/8 Echocardiogram pending -Coreg 12.5 mg BID -Lasix 60 mg BID -Imdur 60 mg daily -Strict in and out -445ml - Daily weight  Essential HTN -See CHF  Coronary artery disease-  -History of multivessel disease status post CABG in 2005.  No chest pain.  EKG he appears to be in atrial fibrillation. -Resume aspirin, statins, Imdur  Prolonged QTC  -QTC 502.   -Hold QT prolonging medication - Ensure electrolytes WNL  Acute on chronic renal failure stage 4-(baseline Cr 3.17 months ago) -Likely cardiorenal.   Lab Results  Component Value Date   CREATININE 3.88 (H) 03/30/2021   CREATININE 3.48 (H) 03/29/2021   CREATININE 3.54 (H) 03/28/2021   CREATININE 3.17 (H) 08/01/2020   CREATININE 2.51 (H)  04/20/2016  - Monitor with diuresis    Chronic respiratory failure  -Baseline on 2 L O2 via [place] - Titrate O2 to maintain SPO2> 92%.   Fall -Recent increase in unsteady gait, increased risk of falls.  Shoulder x-ray without acute abnormality, suggests chronic rotator cuff arthropathy.  Lives at home, gets hospice care, family looking for more of an inpatient/long-term care. -Physical therapy and TOC evaluation -On low-dose benzos,  clonazepam 0.5 nightly, and Ativan 0.25 daily -Swelling to left abdomen appears to be more of dependent edema, than trauma, monitor -Patient on home hospice per admission note both myself and TOC team unsure of why patient was admitted.  Plan now is to try to get SNF bed placement with hospice services.  - 1/8 NCM is investigating into if this is the case, given that wife is also transitioning into inpatient hospice.    DM type II uncontrolled with hyperglycemia -1/7 hemoglobin A1c= 9.0 - Moderate SSI   Hypokalemia - Potassium goal> 4 - 1/8 K-Dur 40 mEq  RIGHT buttocks ulcer stage II Pressure Injury 03/28/21 Buttocks Right Stage 2 -  Partial thickness loss of dermis presenting as a shallow open injury with a red, pink wound bed without slough. stage 2 open area on inner buttocks (Active)  03/28/21 2000  Location: Buttocks  Location Orientation: Right  Staging: Stage 2 -  Partial thickness loss of dermis presenting as a shallow open injury with a red, pink wound bed without slough.  Wound Description (Comments): stage 2 open area on inner buttocks  Present on Admission: Yes  -Ensure frequent turning  DVT prophylaxis: Subcu heparin Code Status: DNR Family Communication: multiple discussions regarding disposition, etc. Status is: Inpatient  Dispo: The patient is from: Home              Anticipated d/c is to: SNF with hospice services.              Anticipated d/c date is: 2 days              Patient currently is medically ready to discharge    Consultants:    Procedures/Significant Events:    I have personally reviewed and interpreted all radiology studies and my findings are as above.  VENTILATOR SETTINGS: Room air 1/8 SPO2 92%  Cultures  Antimicrobials: Anti-infectives (From admission, onward)    None      Devices   LINES / TUBES:   Continuous Infusions:  Objective: Vitals:   03/30/21 0510 03/30/21 0550 03/30/21 0813 03/30/21 1226  BP: 115/73  (!) 138/57  (!) 116/57  Pulse: (!) 55  76 (!) 58  Resp: 17  18 15   Temp: 98 F (36.7 C)   97.8 F (36.6 C)  TempSrc: Oral     SpO2: 100%  97% 98%  Weight:  83.2 kg    Height:        Intake/Output Summary (Last 24 hours) at 03/30/2021 1526 Last data filed at 03/30/2021 1300 Gross per 24 hour  Intake 820 ml  Output 900 ml  Net -80 ml   Filed Weights   03/28/21 2030 03/29/21 0500 03/30/21 0550  Weight: 87.4 kg 87.4 kg 83.2 kg    Examination:  General: Alert, follows commands No acute respiratory distress Eyes: negative scleral hemorrhage, negative anisocoria, negative icterus ENT: Negative Runny nose, negative gingival bleeding, Neck:  Negative scars, masses, torticollis, lymphadenopathy, JVD Lungs: Clear to auscultation bilaterally without wheezes or crackles Cardiovascular: Regular rate and rhythm without murmur gallop or  rub normal S1 and S2 Abdomen: negative abdominal pain, nondistended, positive soft, bowel sounds, no rebound, no ascites, no appreciable mass Extremities: No significant cyanosis, clubbing, 2+ bilateral lower extremity edema to hip Skin: Negative rashes, lesions, ulcers Psychiatric: To evaluate secondary to patient's dysarthria  Central nervous system:  Cranial nerves II through XII intact, tongue/uvula midline, all extremities muscle strength 5/5, sensation intact throughout, positive dysarthria, positive expressive aphasia, negative receptive aphasia.  Data Reviewed: Care during the described time interval was provided by me .  I have reviewed this patient's available data, including medical history, events of note, physical examination, and all test results as part of my evaluation.  CBC: Recent Labs  Lab 03/28/21 1623 03/29/21 0532 03/30/21 0515  WBC 7.2 5.5 5.2  NEUTROABS 6.4  --  4.1  HGB 10.6* 9.8* 9.9*  HCT 34.1* 31.0* 32.0*  MCV 102.1* 99.7 100.9*  PLT 122* 120* 893*   Basic Metabolic Panel: Recent Labs  Lab 03/28/21 1623 03/29/21 0532 03/30/21 0515   NA 142 145 143  K 3.5 3.3* 3.3*  CL 102 104 103  CO2 28 29 27   GLUCOSE 311* 195* 95  BUN 65* 67* 73*  CREATININE 3.54* 3.48* 3.88*  CALCIUM 8.5* 8.5* 8.4*  MG 2.4  --  2.5*  PHOS  --   --  4.3   GFR: Estimated Creatinine Clearance: 14.4 mL/min (A) (by C-G formula based on SCr of 3.88 mg/dL (H)). Liver Function Tests: Recent Labs  Lab 03/28/21 1623 03/30/21 0515  AST 23 14*  ALT 17 15  ALKPHOS 79 56  BILITOT 0.6 0.9  PROT 5.8* 5.3*  ALBUMIN 3.1* 2.9*   No results for input(s): LIPASE, AMYLASE in the last 168 hours. No results for input(s): AMMONIA in the last 168 hours. Coagulation Profile: No results for input(s): INR, PROTIME in the last 168 hours. Cardiac Enzymes: No results for input(s): CKTOTAL, CKMB, CKMBINDEX, TROPONINI in the last 168 hours. BNP (last 3 results) No results for input(s): PROBNP in the last 8760 hours. HbA1C: Recent Labs    03/28/21 1633  HGBA1C 9.0*   CBG: Recent Labs  Lab 03/29/21 1057 03/29/21 1623 03/29/21 2106 03/30/21 0718 03/30/21 1127  GLUCAP 251* 132* 94 89 207*   Lipid Profile: No results for input(s): CHOL, HDL, LDLCALC, TRIG, CHOLHDL, LDLDIRECT in the last 72 hours. Thyroid Function Tests: No results for input(s): TSH, T4TOTAL, FREET4, T3FREE, THYROIDAB in the last 72 hours. Anemia Panel: No results for input(s): VITAMINB12, FOLATE, FERRITIN, TIBC, IRON, RETICCTPCT in the last 72 hours. Sepsis Labs: No results for input(s): PROCALCITON, LATICACIDVEN in the last 168 hours.  Recent Results (from the past 240 hour(s))  Resp Panel by RT-PCR (Flu A&B, Covid) Nasopharyngeal Swab     Status: None   Collection Time: 03/28/21  4:03 PM   Specimen: Nasopharyngeal Swab; Nasopharyngeal(NP) swabs in vial transport medium  Result Value Ref Range Status   SARS Coronavirus 2 by RT PCR NEGATIVE NEGATIVE Final    Comment: (NOTE) SARS-CoV-2 target nucleic acids are NOT DETECTED.  The SARS-CoV-2 RNA is generally detectable in upper  respiratory specimens during the acute phase of infection. The lowest concentration of SARS-CoV-2 viral copies this assay can detect is 138 copies/mL. A negative result does not preclude SARS-Cov-2 infection and should not be used as the sole basis for treatment or other patient management decisions. A negative result may occur with  improper specimen collection/handling, submission of specimen other than nasopharyngeal swab, presence of viral mutation(s) within the  areas targeted by this assay, and inadequate number of viral copies(<138 copies/mL). A negative result must be combined with clinical observations, patient history, and epidemiological information. The expected result is Negative.  Fact Sheet for Patients:  EntrepreneurPulse.com.au  Fact Sheet for Healthcare Providers:  IncredibleEmployment.be  This test is no t yet approved or cleared by the [place] FDA and  has been authorized for detection and/or diagnosis of SARS-CoV-2 by FDA under an Emergency Use Authorization (EUA). This EUA will remain  in effect (meaning this test can be used) for the duration of the COVID-19 declaration under Section 564(b)(1) of the Act, 21 U.S.C.section 360bbb-3(b)(1), unless the authorization is terminated  or revoked sooner.       Influenza A by PCR NEGATIVE NEGATIVE Final   Influenza B by PCR NEGATIVE NEGATIVE Final    Comment: (NOTE) The Xpert Xpress SARS-CoV-2/FLU/RSV plus assay is intended as an aid in the diagnosis of influenza from Nasopharyngeal swab specimens and should not be used as a sole basis for treatment. Nasal washings and aspirates are unacceptable for Xpert Xpress SARS-CoV-2/FLU/RSV testing.  Fact Sheet for Patients: EntrepreneurPulse.com.au  Fact Sheet for Healthcare Providers: IncredibleEmployment.be  This test is not yet approved or cleared by the [place] FDA and has been  authorized for detection and/or diagnosis of SARS-CoV-2 by FDA under an Emergency Use Authorization (EUA). This EUA will remain in effect (meaning this test can be used) for the duration of the COVID-19 declaration under Section 564(b)(1) of the Act, 21 U.S.C. section 360bbb-3(b)(1), unless the authorization is terminated or revoked.  Performed at [facility], [address] [place], [place] [address]     Radiology Studies: DG Chest 2 View  Result Date: 03/28/2021 CLINICAL DATA:  Fall.  Right shoulder pain radiating into the chest. EXAM: CHEST - 2 VIEW COMPARISON:  10/31/2014 FINDINGS: Post median sternotomy and CABG. Left-sided pacemaker in place. The heart is enlarged. Lower lung volumes from prior exam. There is a small right pleural effusion with fluid tracking into the minor fissure. Associated right basilar opacity. No pneumothorax. Bronchovascular crowding versus vascular congestion. No acute rib fractures are seen IMPRESSION: 1. Small right pleural effusion with associated right basilar opacity. 2. Cardiomegaly with bronchovascular crowding versus vascular congestion. Electronically Signed   By: Keith Rake M.D.   On: 03/28/2021 17:21   DG Shoulder Right  Result Date: 03/28/2021 CLINICAL DATA:  Post fall with right shoulder pain. EXAM: RIGHT SHOULDER - 2+ VIEW COMPARISON:  None. FINDINGS: There is no evidence of fracture or dislocation. Mild acromioclavicular osteoarthritis. Small subacromial spur. There is superior subluxation of the humeral head. Soft tissues are unremarkable. IMPRESSION: 1. No fracture or dislocation of the right shoulder. 2. Mild acromioclavicular osteoarthritis. Small subacromial spur. 3. Superior subluxation of the humeral head suggests chronic rotator cuff arthropathy. Electronically Signed   By: Keith Rake M.D.   On: 03/28/2021 17:20   ECHOCARDIOGRAM COMPLETE  Result Date: 03/29/2021    ECHOCARDIOGRAM REPORT   Patient Name:   JONERIK SLIKER Date of Exam:  03/29/2021 Medical Rec #:  [id number]     Height:       68.0 in Accession #:    [id number]    Weight:       192.7 lb Date of Birth:  07/15/1932    BSA:          2.012 m Patient Age:    58 years      BP:  116/65 mmHg Patient Gender: M             HR:           81 bpm. Exam Location:  [facility] Procedure: 2D Echo, Cardiac Doppler and Color Doppler Indications:    Fluid Overload  History:        Patient has prior history of Echocardiogram examinations, most                 recent 11/29/2018. CHF, CAD, Prior CABG and Defibrillator,                 Arrythmias:RBBB; Risk Factors:Hypertension, Diabetes and                 Dyslipidemia.  Sonographer:    Wenda Low Referring Phys: [place]  1. Akinesis of the inferior and inferolateral walls with remaining walls hypokinetic; overall severe LV dysfunction.  2. Left ventricular ejection fraction, by estimation, is 20 to 25%. The left ventricle has severely decreased function. The left ventricle demonstrates regional wall motion abnormalities (see scoring diagram/findings for description). The left ventricular internal cavity size was mildly dilated. There is severe left ventricular hypertrophy. Left ventricular diastolic parameters are indeterminate.  3. Right ventricular systolic function is mildly reduced. The right ventricular size is mildly enlarged. There is mildly elevated pulmonary artery systolic pressure.  4. Left atrial size was severely dilated.  5. The mitral valve is normal in structure. Mild mitral valve regurgitation. No evidence of mitral stenosis.  6. The aortic valve is tricuspid. Aortic valve regurgitation is trivial. Moderate aortic valve stenosis.  7. The inferior vena cava is normal in size with greater than 50% respiratory variability, suggesting right atrial pressure of 3 mmHg. FINDINGS  Left Ventricle: Left ventricular ejection fraction, by estimation, is 20 to 25%. The left ventricle has severely decreased  function. The left ventricle demonstrates regional wall motion abnormalities. Definity contrast agent was given IV to delineate the left ventricular endocardial borders. The left ventricular internal cavity size was mildly dilated. There is severe left ventricular hypertrophy. Left ventricular diastolic parameters are indeterminate. Right Ventricle: The right ventricular size is mildly enlarged. Right ventricular systolic function is mildly reduced. There is mildly elevated pulmonary artery systolic pressure. The tricuspid regurgitant velocity is 2.74 m/s, and with an assumed right atrial pressure of 8 mmHg, the estimated right ventricular systolic pressure is 83.3 mmHg. Left Atrium: Left atrial size was severely dilated. Right Atrium: Right atrial size was normal in size. Pericardium: There is no evidence of pericardial effusion. Mitral Valve: The mitral valve is normal in structure. Mild mitral annular calcification. Mild mitral valve regurgitation. No evidence of mitral valve stenosis. MV peak gradient, 2.5 mmHg. The mean mitral valve gradient is 1.0 mmHg. Tricuspid Valve: The tricuspid valve is normal in structure. Tricuspid valve regurgitation is mild . No evidence of tricuspid stenosis. Aortic Valve: The aortic valve is tricuspid. Aortic valve regurgitation is trivial. Moderate aortic stenosis is present. Aortic valve mean gradient measures 9.5 mmHg. Aortic valve peak gradient measures 19.3 mmHg. Aortic valve area, by VTI measures 1.31 cm. Pulmonic Valve: The pulmonic valve was normal in structure. Pulmonic valve regurgitation is trivial. No evidence of pulmonic stenosis. Aorta: The aortic root is normal in size and structure. Venous: The inferior vena cava is normal in size with greater than 50% respiratory variability, suggesting right atrial pressure of 3 mmHg. IAS/Shunts: No atrial level shunt detected by color flow Doppler. Additional Comments: Akinesis of  the inferior and inferolateral walls with  remaining walls hypokinetic; overall severe LV dysfunction. A device lead is visualized.  LEFT VENTRICLE PLAX 2D LVIDd:         5.60 cm      Diastology LVIDs:         4.70 cm      LV e' lateral:   7.73 cm/s LV PW:         1.70 cm      LV E/e' lateral: 9.2 LV IVS:        2.00 cm LVOT diam:     2.20 cm LV SV:         62 LV SV Index:   31 LVOT Area:     3.80 cm  LV Volumes (MOD) LV vol d, MOD A2C: 244.0 ml LV vol d, MOD A4C: 172.0 ml LV vol s, MOD A2C: 187.0 ml LV vol s, MOD A4C: 128.0 ml LV SV MOD A2C:     57.0 ml LV SV MOD A4C:     172.0 ml LV SV MOD BP:      50.6 ml RIGHT VENTRICLE RV Basal diam:  4.25 cm RV Mid diam:    3.20 cm RV S prime:     5.64 cm/s LEFT ATRIUM              Index        RIGHT ATRIUM           Index LA diam:        4.90 cm  2.44 cm/m   RA Area:     21.00 cm LA Vol (A2C):   100.0 ml 49.71 ml/m  RA Volume:   64.30 ml  31.96 ml/m LA Vol (A4C):   108.0 ml 53.69 ml/m LA Biplane Vol: 105.0 ml 52.20 ml/m  AORTIC VALVE                     PULMONIC VALVE AV Area (Vmax):    1.21 cm      PV Vmax:       0.56 m/s AV Area (Vmean):   1.20 cm      PV Peak grad:  1.2 mmHg AV Area (VTI):     1.31 cm AV Vmax:           219.50 cm/s AV Vmean:          144.500 cm/s AV VTI:            0.475 m AV Peak Grad:      19.3 mmHg AV Mean Grad:      9.5 mmHg LVOT Vmax:         69.80 cm/s LVOT Vmean:        45.600 cm/s LVOT VTI:          0.163 m LVOT/AV VTI ratio: 0.34  AORTA Ao Root diam: 3.10 cm Ao Asc diam:  3.40 cm MITRAL VALVE               TRICUSPID VALVE MV Area (PHT): 3.50 cm    TR Peak grad:   30.0 mmHg MV Area VTI:   3.14 cm    TR Vmax:        274.00 cm/s MV Peak grad:  2.5 mmHg MV Mean grad:  1.0 mmHg    SHUNTS MV Vmax:       0.80 m/s    Systemic VTI:  0.16 m MV Vmean:      54.4 cm/s  Systemic Diam: 2.20 cm MV Decel Time: 217 msec MV E velocity: 71.10 cm/s MV A velocity: 54.00 cm/s MV E/A ratio:  1.32 Kirk Ruths MD Electronically signed by Kirk Ruths MD Signature Date/Time: 03/29/2021/10:48:22 AM     Final    DG Hips Bilat W or Wo Pelvis 2 Views  Result Date: 03/28/2021 CLINICAL DATA:  Fall.  Left hip pain. EXAM: DG HIP (WITH OR WITHOUT PELVIS) 2V BILAT COMPARISON:  None. FINDINGS: The cortical margins of the bony pelvis are intact. No fracture. Pubic symphysis and sacroiliac joints are congruent. Both femoral heads are well-seated in the respective acetabula. Moderate bilateral hip osteoarthritis. Bones are diffusely under mineralized. Lobulated soft tissue calcifications lateral to the right hip greater trochanter, may be granulomas or sequela of prior injury. Prominent vascular calcifications. IMPRESSION: 1. No fracture of the pelvis or hips. 2. Moderate bilateral hip osteoarthritis. Electronically Signed   By: Keith Rake M.D.   On: 03/28/2021 17:22    Scheduled Meds:  allopurinol  100 mg Oral Daily   ALPRAZolam  0.25 mg Oral Daily   aspirin  81 mg Oral Daily   atorvastatin  80 mg Oral q1800   carvedilol  12.5 mg Oral BID WC   citalopram  20 mg Oral Daily   clonazePAM  0.5 mg Oral QHS   furosemide  60 mg Intravenous Q12H   heparin  5,000 Units Subcutaneous Q8H   insulin aspart  0-15 Units Subcutaneous TID WC   insulin aspart  0-5 Units Subcutaneous QHS   isosorbide mononitrate  60 mg Oral Daily   levothyroxine  75 mcg Oral Daily   rOPINIRole  0.5 mg Oral QHS   Continuous Infusions:   LOS: 2 days    Time spent:35 min  Irwin Brakeman, MD [facility]   If 7PM-7AM, please contact night-coverage 03/30/2021, 3:26 PM

## 2021-03-30 NOTE — Plan of Care (Signed)
°  Problem: Acute Rehab PT Goals(only PT should resolve) Goal: Pt Will Go Supine/Side To Sit Outcome: Progressing Flowsheets (Taken 03/30/2021 1231) Pt will go Supine/Side to Sit:  with minimal assist  with min guard assist Goal: Patient Will Transfer Sit To/From Stand Outcome: Progressing Flowsheets (Taken 03/30/2021 1231) Patient will transfer sit to/from stand:  with min guard assist  with minimal assist Goal: Pt Will Transfer Bed To Chair/Chair To Bed Outcome: Progressing Flowsheets (Taken 03/30/2021 1231) Pt will Transfer Bed to Chair/Chair to Bed:  with min assist  min guard assist Goal: Pt Will Ambulate Outcome: Progressing Flowsheets (Taken 03/30/2021 1231) Pt will Ambulate:  25 feet  with minimal assist  with rolling walker   12:31 PM, 03/30/21 Lonell Grandchild, MPT Physical Therapist with [facility] [phone] office [phone] mobile phone

## 2021-03-30 NOTE — TOC Initial Note (Signed)
Transition of Care [facility]) - Initial/Assessment Note    Patient Details  Name: Clayton Morton MRN: [id number] Date of Birth: 01-09-1933  Transition of Care [facility]) CM/SW Contact:    Boneta Lucks, RN Phone Number: 03/30/2021, 1:52 PM  Clinical Narrative:    Patient admitted with acute on chronic combined systolic and diastolic CHF. Patient lives a home with his wife. They both are active with [facility]. They have a sitter. Both this admission he could walk and care for himself in the home. They do not have a sitter at night. Per Family they will need placement for both as they are declining.  [place] Education officer, museum at hospice met with family today and sent a referral to [facility] for a bed with hospice. Patient is not appropriate for residential at the is time.  RN/MD updated.            Expected Discharge Plan: [place] (nursing home bed with hospice) Barriers to Discharge: Continued Medical Work up   Patient Goals and CMS Choice Patient states their goals for this hospitalization and ongoing recovery are:: needs placement CMS Medicare.gov Compare Post Acute Care list provided to:: Patient Represenative (must comment) Choice offered to / list presented to : Adult Children  Expected Discharge Plan and Services Expected Discharge Plan: [place] (nursing home bed with hospice)     Post Acute Care Choice: Hospice Living arrangements for the past 2 months: Single Family Home                                      Prior Living Arrangements/Services Living arrangements for the past 2 months: Single Family Home Lives with:: Spouse, Other (Comment) (Caregivers)              Current home services: DME    Activities of Daily Living Home Assistive Devices/Equipment: Environmental consultant (specify type) ADL Screening (condition at time of admission) Patient's cognitive ability adequate to safely complete daily activities?: Yes Is the patient deaf or have  difficulty hearing?: No Does the patient have difficulty seeing, even when wearing glasses/contacts?: No Does the patient have difficulty concentrating, remembering, or making decisions?: No Patient able to express need for assistance with ADLs?: Yes Does the patient have difficulty dressing or bathing?: Yes Independently performs ADLs?: No Communication: Independent Dressing (OT): Needs assistance Is this a change from baseline?: Pre-admission baseline Grooming: Needs assistance Is this a change from baseline?: Pre-admission baseline Feeding: Independent Bathing: Needs assistance Is this a change from baseline?: Pre-admission baseline Toileting: Needs assistance Is this a change from baseline?: Pre-admission baseline In/Out Bed: Needs assistance Is this a change from baseline?: Pre-admission baseline Walks in Home: Needs assistance Is this a change from baseline?: Pre-admission baseline Does the patient have difficulty walking or climbing stairs?: Yes Weakness of Legs: Both Weakness of Arms/Hands: None  Permission Sought/Granted                  Emotional Assessment         Alcohol / Substance Use: Not Applicable Psych Involvement: No (comment)  Admission diagnosis:  Acute on chronic systolic heart failure ([place]) [I50.23] Fall, initial encounter [W19.XXXA] Acute on chronic congestive heart failure, unspecified heart failure type ([place]) [I50.9] Acute on chronic combined systolic (congestive) and diastolic (congestive) heart failure ([place]) [I50.43] Patient Active Problem List   Diagnosis Date Noted   Pressure injury of skin 03/30/2021  Sinus bradycardia 10/30/2014   RBBB (right bundle branch block) 10/30/2014   Ischemic cardiomyopathy 10/30/2014   Chronic renal failure 51/12/2109   Chronic systolic heart failure ([place]) 12/11/2013   Acute on chronic combined systolic and diastolic CHF (congestive heart failure) ([place]) 11/27/2013   Cardiomyopathy, ischemic 01/12/2010    DIABETES MELLITUS, TYPE II, UNCONTROLLED 09/11/2007   HYPERLIPIDEMIA 09/11/2007   Essential hypertension, benign 09/11/2007   Coronary atherosclerosis of native coronary artery 09/11/2007   PCP:  Glenda Chroman, MD Pharmacy:   [facility], [address] [place] [address] Phone: [phone] Fax: [phone]     Social Determinants of Health (SDOH) Interventions    Readmission Risk Interventions Readmission Risk Prevention Plan 03/30/2021  Transportation Screening Complete  Home Care Screening Complete  Medication Review (RN CM) Complete  Some recent data might be hidden

## 2021-03-31 ENCOUNTER — Inpatient Hospital Stay (HOSPITAL_COMMUNITY)

## 2021-03-31 ENCOUNTER — Encounter (HOSPITAL_COMMUNITY): Payer: Self-pay | Admitting: Internal Medicine

## 2021-03-31 DIAGNOSIS — Z7189 Other specified counseling: Secondary | ICD-10-CM

## 2021-03-31 DIAGNOSIS — N184 Chronic kidney disease, stage 4 (severe): Secondary | ICD-10-CM | POA: Diagnosis not present

## 2021-03-31 DIAGNOSIS — I25119 Atherosclerotic heart disease of native coronary artery with unspecified angina pectoris: Secondary | ICD-10-CM | POA: Diagnosis not present

## 2021-03-31 DIAGNOSIS — Z515 Encounter for palliative care: Secondary | ICD-10-CM

## 2021-03-31 DIAGNOSIS — I255 Ischemic cardiomyopathy: Secondary | ICD-10-CM | POA: Diagnosis not present

## 2021-03-31 DIAGNOSIS — I5043 Acute on chronic combined systolic (congestive) and diastolic (congestive) heart failure: Secondary | ICD-10-CM | POA: Diagnosis not present

## 2021-03-31 LAB — CBC WITH DIFFERENTIAL/PLATELET
Abs Immature Granulocytes: 0.03 10*3/uL (ref 0.00–0.07)
Basophils Absolute: 0 10*3/uL (ref 0.0–0.1)
Basophils Relative: 1 %
Eosinophils Absolute: 0.1 10*3/uL (ref 0.0–0.5)
Eosinophils Relative: 3 %
HCT: 32.4 % — ABNORMAL LOW (ref 39.0–52.0)
Hemoglobin: 10.3 g/dL — ABNORMAL LOW (ref 13.0–17.0)
Immature Granulocytes: 1 %
Lymphocytes Relative: 10 %
Lymphs Abs: 0.5 10*3/uL — ABNORMAL LOW (ref 0.7–4.0)
MCH: 32.3 pg (ref 26.0–34.0)
MCHC: 31.8 g/dL (ref 30.0–36.0)
MCV: 101.6 fL — ABNORMAL HIGH (ref 80.0–100.0)
Monocytes Absolute: 0.5 10*3/uL (ref 0.1–1.0)
Monocytes Relative: 9 %
Neutro Abs: 3.7 10*3/uL (ref 1.7–7.7)
Neutrophils Relative %: 76 %
Platelets: 121 10*3/uL — ABNORMAL LOW (ref 150–400)
RBC: 3.19 MIL/uL — ABNORMAL LOW (ref 4.22–5.81)
RDW: 17.9 % — ABNORMAL HIGH (ref 11.5–15.5)
WBC: 4.8 10*3/uL (ref 4.0–10.5)
nRBC: 0 % (ref 0.0–0.2)

## 2021-03-31 LAB — COMPREHENSIVE METABOLIC PANEL
ALT: 16 U/L (ref 0–44)
AST: 12 U/L — ABNORMAL LOW (ref 15–41)
Albumin: 2.8 g/dL — ABNORMAL LOW (ref 3.5–5.0)
Alkaline Phosphatase: 62 U/L (ref 38–126)
Anion gap: 11 (ref 5–15)
BUN: 78 mg/dL — ABNORMAL HIGH (ref 8–23)
CO2: 27 mmol/L (ref 22–32)
Calcium: 8.3 mg/dL — ABNORMAL LOW (ref 8.9–10.3)
Chloride: 103 mmol/L (ref 98–111)
Creatinine, Ser: 3.77 mg/dL — ABNORMAL HIGH (ref 0.61–1.24)
GFR, Estimated: 15 mL/min — ABNORMAL LOW (ref >60)
Glucose, Bld: 129 mg/dL — ABNORMAL HIGH (ref 70–99)
Potassium: 3.4 mmol/L — ABNORMAL LOW (ref 3.5–5.1)
Sodium: 141 mmol/L (ref 135–145)
Total Bilirubin: 0.9 mg/dL (ref 0.3–1.2)
Total Protein: 5.4 g/dL — ABNORMAL LOW (ref 6.5–8.1)

## 2021-03-31 LAB — MAGNESIUM: Magnesium: 2.4 mg/dL (ref 1.7–2.4)

## 2021-03-31 LAB — GLUCOSE, CAPILLARY
Glucose-Capillary: 120 mg/dL — ABNORMAL HIGH (ref 70–99)
Glucose-Capillary: 243 mg/dL — ABNORMAL HIGH (ref 70–99)
Glucose-Capillary: 209 mg/dL — ABNORMAL HIGH (ref 70–99)
Glucose-Capillary: 144 mg/dL — ABNORMAL HIGH (ref 70–99)

## 2021-03-31 LAB — PHOSPHORUS: Phosphorus: 4.4 mg/dL (ref 2.5–4.6)

## 2021-03-31 MED ORDER — POTASSIUM CHLORIDE 20 MEQ PO PACK
40.0000 meq | PACK | Freq: Once | ORAL | Status: AC
  Administered 2021-03-31: 40 meq via ORAL
  Filled 2021-03-31: qty 2

## 2021-03-31 MED ORDER — FUROSEMIDE 10 MG/ML IJ SOLN
40.0000 mg | Freq: Two times a day (BID) | INTRAMUSCULAR | Status: DC
  Administered 2021-03-31 – 2021-04-01 (×3): 40 mg via INTRAVENOUS
  Filled 2021-03-31 (×3): qty 4

## 2021-03-31 MED ORDER — ALPRAZOLAM 0.25 MG PO TABS: 0.2500 mg | ORAL_TABLET | Freq: Two times a day (BID) | ORAL | Status: DC | PRN

## 2021-03-31 NOTE — Progress Notes (Signed)
Bilateral lower legs noted to have large patchy areas (circumferentially) of shiny, brownish colored dried skin/exudate. Small open bleeding area x2 to left shin, surrounded by thick dried skin, along with small amount thick, tan colored exudate with foul odor.  When areas rubbed with wet washcloth to clean, layers of dried skin and exudate peel away from skin, but leaves no open wound. Pt states skin on legs, "sting" when touched or clean but no real pain. Pt states his legs have had this appearance, "for a long time now", denies any injury.

## 2021-03-31 NOTE — Progress Notes (Signed)
PROGRESS NOTE   TEE RICHESON  IWO:[id number] DOB: [birth year] DOA: 03/28/2021 PCP: Glenda Chroman, MD   Brief Narrative:  88 y.o. WM PMHx  CHF-cardiomyopathy, AICD status, chronic renal failure,, diabetes mellitus, hypertension, chronic respiratory failure on 2 L.  Brought to the ED with reports of a fall.  Patient's step-son is at bedside and assists with the history.  Patient has a hospital bed, was trying to get up when he fell on his left shoulder.  Uses a walker for ambulation.  Over the past 2 weeks he has become more unsteady/shaky on his feet, they deny other falls.  Patient has also had some difficulty breathing recently, with increasing swelling involving his arm and bilateral lower extremities.  He has been compliant with Lasix, over the past week, he was told by his primary care provider, to double his Lasix dose for 3 days.  He took this without significant improvement.  No chest pain.  No black stools no blood in stools or vomiting of blood.   Patient has a caregiver, and hospice care at home.  But family is concerned about patient's increasing dependency and that the level of care he has currently is not enough.  Patient's outpatient provider was trying to arrange placement as an out patient.   ED Course: Temperature 98.  Heart rate 60s to 80s.  Respiratory rate 20-21.  Blood pressure systolic 10 2-1 29.  O2 sats 90 to 100% on room air. BNP markedly elevated at 2789.  Potassium 3.5.  Creatinine 3.54.  EKG suggesting atrial fibrillation. IV Lasix 80 mg x 1 given.  Hospitalist to admit for volume overload, fall.  Subjective: He is sitting up in chair, he reports that he is having less shortness of breath, no chest pain.  He is feeling a little better today.    Assessment & Plan: Covid vaccination;   Principal Problem:   Acute on chronic combined systolic and diastolic CHF (congestive heart failure) (HCC) Active Problems:   Coronary atherosclerosis of native coronary artery    Chronic renal failure   RBBB (right bundle branch block)   Ischemic cardiomyopathy   Pressure injury of skin  Acute on chronic systolic and diastolic CHF, AICD   -Dyspnea, resolved - Pitting 2+ extremity edema, dependent edema to right abdomen and right upper arm with bullae. Improving with diuresis.  Weight gain per charts, 161 in May 2022, today 190.  BNP elevated at 2789.  Last echo 2020 EF 25 to 30% with impaired LV diastolic relaxation.  Reports compliance with Lasix 20 mg twice daily.  EKG suggesting atrial fibrillation. Filed Weights   03/29/21 0500 03/30/21 0550 03/31/21 0526  Weight: 87.4 kg 83.2 kg 86.3 kg    Intake/Output Summary (Last 24 hours) at 03/31/2021 1200 Last data filed at 03/31/2021 0900 Gross per 24 hour  Intake 900 ml  Output 600 ml  Net 300 ml    -Chest X-ray shows small right pleural effusion, associated right basilar opacity, bronchovascular crowding versus vascular congestion.  Latest Reference Range & Units 03/28/21 16:23 03/28/21 21:15  Troponin I (High Sensitivity) <18 ng/L 201 (HH) 248 (HH)  (HH): Data is critically high -1/8 trend troponin -1/8 Echocardiogram pending -Coreg 12.5 mg BID -Lasix 60 mg BID -Imdur 60 mg daily -Strict in and out -429ml - Daily weight  Essential HTN -stable, controlled   Coronary artery disease-  -History of multivessel disease status post CABG in 2005.  No chest pain.  EKG he appears to be in  atrial fibrillation. -Resume aspirin, statins, Imdur  Swollen right upper extremity  - venous US right forearm to rule out DVT    Prolonged QTC  -QTC 502.   -Hold QT prolonging medication - Ensure electrolytes WNL  Acute on chronic renal failure stage 4-(baseline Cr 3.17 months ago) -Likely cardiorenal.   Lab Results  Component Value Date   CREATININE 3.77 (H) 03/31/2021   CREATININE 3.88 (H) 03/30/2021   CREATININE 3.48 (H) 03/29/2021   CREATININE 3.54 (H) 03/28/2021   CREATININE 3.17 (H) 08/01/2020  - Monitor  with diuresis    Chronic respiratory failure  -Baseline on 2 L O2 via [place] - Titrate O2 to maintain SPO2> 92%.   Fall -Recent increase in unsteady gait, increased risk of falls.  Shoulder x-ray without acute abnormality, suggests chronic rotator cuff arthropathy.  Lives at home, gets hospice care, family looking for more of an inpatient/long-term care. -Physical therapy and TOC evaluation -On low-dose benzos, clonazepam 0.5 nightly, and Ativan 0.25 daily -Swelling to left abdomen appears to be more of dependent edema, than trauma, monitor -Patient on home hospice per admission note both myself and TOC team unsure of why patient was admitted.  Plan now is to try to get SNF bed placement with hospice services.  - 1/8 Social worker planning for SNF bed placement at [place] with hospice services.     DM type II uncontrolled with hyperglycemia -1/7 hemoglobin A1c= 9.0 - Moderate SSI   Hypokalemia - Potassium goal> 4 - 1/8 K-Dur 40 mEq  RIGHT buttocks ulcer stage II Pressure Injury 03/28/21 Buttocks Right Stage 2 -  Partial thickness loss of dermis presenting as a shallow open injury with a red, pink wound bed without slough. stage 2 open area on inner buttocks (Active)  03/28/21 2000  Location: Buttocks  Location Orientation: Right  Staging: Stage 2 -  Partial thickness loss of dermis presenting as a shallow open injury with a red, pink wound bed without slough.  Wound Description (Comments): stage 2 open area on inner buttocks  Present on Admission: Yes  -Ensure frequent turning  DVT prophylaxis: Subcu heparin Code Status: DNR Family Communication: multiple discussions regarding disposition, etc. Status is: Inpatient  Dispo: The patient is from: Home              Anticipated d/c is to: SNF with hospice services.              Anticipated d/c date is: 2 days              Patient currently is medically ready to discharge, Awaiting SNF bed placement   Consultants:     Procedures/Significant Events:    I have personally reviewed and interpreted all radiology studies and my findings are as above.  VENTILATOR SETTINGS: Room air 1/8 SPO2 92%  Cultures  Antimicrobials: Anti-infectives (From admission, onward)    None      Devices   LINES / TUBES:   Continuous Infusions:  Objective: Vitals:   03/30/21 1708 03/30/21 2114 03/31/21 0526 03/31/21 1027  BP: 117/76 121/66 122/67 125/64  Pulse: 72 62 77 79  Resp:  16 17   Temp:  98.1 F (36.7 C) 98.7 F (37.1 C)   TempSrc:      SpO2:  97% 95%   Weight:   86.3 kg   Height:        Intake/Output Summary (Last 24 hours) at 03/31/2021 1159 Last data filed at 03/31/2021 0900 Gross per 24 hour  Intake 900 ml  Output 600 ml  Net 300 ml   Filed Weights   03/29/21 0500 03/30/21 0550 03/31/21 0526  Weight: 87.4 kg 83.2 kg 86.3 kg    Examination:  General: Alert, follows commands No acute respiratory distress Eyes: negative scleral hemorrhage, negative anisocoria, negative icterus ENT: Negative Runny nose, negative gingival bleeding, Neck:  Negative scars, masses, torticollis, lymphadenopathy, JVD Lungs: Clear to auscultation bilaterally without wheezes or crackles Cardiovascular: Regular rate and rhythm without murmur gallop or rub normal S1 and S2 Abdomen: negative abdominal pain, nondistended, positive soft, bowel sounds, no rebound, no ascites, no appreciable mass Extremities: No significant cyanosis, clubbing, 2+ bilateral lower extremity edema to hip Skin: Negative rashes, lesions, ulcers Psychiatric: To evaluate secondary to patient's dysarthria  Central nervous system:  Cranial nerves II through XII intact, tongue/uvula midline, all extremities muscle strength 5/5, sensation intact throughout, positive dysarthria, positive expressive aphasia, negative receptive aphasia.  Data Reviewed: Care during the described time interval was provided by me .  I have reviewed this patient's  available data, including medical history, events of note, physical examination, and all test results as part of my evaluation.  CBC: Recent Labs  Lab 03/28/21 1623 03/29/21 0532 03/30/21 0515 03/31/21 0542  WBC 7.2 5.5 5.2 4.8  NEUTROABS 6.4  --  4.1 3.7  HGB 10.6* 9.8* 9.9* 10.3*  HCT 34.1* 31.0* 32.0* 32.4*  MCV 102.1* 99.7 100.9* 101.6*  PLT 122* 120* 120* 051*   Basic Metabolic Panel: Recent Labs  Lab 03/28/21 1623 03/29/21 0532 03/30/21 0515 03/31/21 0542  NA 142 145 143 141  K 3.5 3.3* 3.3* 3.4*  CL 102 104 103 103  CO2 28 29 27 27   GLUCOSE 311* 195* 95 129*  BUN 65* 67* 73* 78*  CREATININE 3.54* 3.48* 3.88* 3.77*  CALCIUM 8.5* 8.5* 8.4* 8.3*  MG 2.4  --  2.5* 2.4  PHOS  --   --  4.3 4.4   GFR: Estimated Creatinine Clearance: 15 mL/min (A) (by C-G formula based on SCr of 3.77 mg/dL (H)). Liver Function Tests: Recent Labs  Lab 03/28/21 1623 03/30/21 0515 03/31/21 0542  AST 23 14* 12*  ALT 17 15 16   ALKPHOS 79 56 62  BILITOT 0.6 0.9 0.9  PROT 5.8* 5.3* 5.4*  ALBUMIN 3.1* 2.9* 2.8*   No results for input(s): LIPASE, AMYLASE in the last 168 hours. No results for input(s): AMMONIA in the last 168 hours. Coagulation Profile: No results for input(s): INR, PROTIME in the last 168 hours. Cardiac Enzymes: No results for input(s): CKTOTAL, CKMB, CKMBINDEX, TROPONINI in the last 168 hours. BNP (last 3 results) No results for input(s): PROBNP in the last 8760 hours. HbA1C: Recent Labs    03/28/21 1633  HGBA1C 9.0*   CBG: Recent Labs  Lab 03/30/21 0718 03/30/21 1127 03/30/21 1626 03/30/21 2113 03/31/21 0749  GLUCAP 89 207* 218* 139* 120*   Lipid Profile: No results for input(s): CHOL, HDL, LDLCALC, TRIG, CHOLHDL, LDLDIRECT in the last 72 hours. Thyroid Function Tests: No results for input(s): TSH, T4TOTAL, FREET4, T3FREE, THYROIDAB in the last 72 hours. Anemia Panel: No results for input(s): VITAMINB12, FOLATE, FERRITIN, TIBC, IRON, RETICCTPCT in  the last 72 hours. Sepsis Labs: No results for input(s): PROCALCITON, LATICACIDVEN in the last 168 hours.  Recent Results (from the past 240 hour(s))  Resp Panel by RT-PCR (Flu A&B, Covid) Nasopharyngeal Swab     Status: None   Collection Time: 03/28/21  4:03 PM   Specimen: Nasopharyngeal Swab;  Nasopharyngeal(NP) swabs in vial transport medium  Result Value Ref Range Status   SARS Coronavirus 2 by RT PCR NEGATIVE NEGATIVE Final    Comment: (NOTE) SARS-CoV-2 target nucleic acids are NOT DETECTED.  The SARS-CoV-2 RNA is generally detectable in upper respiratory specimens during the acute phase of infection. The lowest concentration of SARS-CoV-2 viral copies this assay can detect is 138 copies/mL. A negative result does not preclude SARS-Cov-2 infection and should not be used as the sole basis for treatment or other patient management decisions. A negative result may occur with  improper specimen collection/handling, submission of specimen other than nasopharyngeal swab, presence of viral mutation(s) within the areas targeted by this assay, and inadequate number of viral copies(<138 copies/mL). A negative result must be combined with clinical observations, patient history, and epidemiological information. The expected result is Negative.  Fact Sheet for Patients:  EntrepreneurPulse.com.au  Fact Sheet for Healthcare Providers:  IncredibleEmployment.be  This test is no t yet approved or cleared by the [place] FDA and  has been authorized for detection and/or diagnosis of SARS-CoV-2 by FDA under an Emergency Use Authorization (EUA). This EUA will remain  in effect (meaning this test can be used) for the duration of the COVID-19 declaration under Section 564(b)(1) of the Act, 21 U.S.C.section 360bbb-3(b)(1), unless the authorization is terminated  or revoked sooner.       Influenza A by PCR NEGATIVE NEGATIVE Final   Influenza B by PCR  NEGATIVE NEGATIVE Final    Comment: (NOTE) The Xpert Xpress SARS-CoV-2/FLU/RSV plus assay is intended as an aid in the diagnosis of influenza from Nasopharyngeal swab specimens and should not be used as a sole basis for treatment. Nasal washings and aspirates are unacceptable for Xpert Xpress SARS-CoV-2/FLU/RSV testing.  Fact Sheet for Patients: EntrepreneurPulse.com.au  Fact Sheet for Healthcare Providers: IncredibleEmployment.be  This test is not yet approved or cleared by the [place] FDA and has been authorized for detection and/or diagnosis of SARS-CoV-2 by FDA under an Emergency Use Authorization (EUA). This EUA will remain in effect (meaning this test can be used) for the duration of the COVID-19 declaration under Section 564(b)(1) of the Act, 21 U.S.C. section 360bbb-3(b)(1), unless the authorization is terminated or revoked.  Performed at [facility], [address], [place], [place] [address]     Radiology Studies: No results found.  Scheduled Meds:  allopurinol  100 mg Oral Daily   aspirin  81 mg Oral Daily   atorvastatin  80 mg Oral q1800   carvedilol  12.5 mg Oral BID WC   citalopram  20 mg Oral Daily   clonazePAM  0.5 mg Oral QHS   furosemide  40 mg Intravenous Q12H   heparin  5,000 Units Subcutaneous Q8H   insulin aspart  0-15 Units Subcutaneous TID WC   insulin aspart  0-5 Units Subcutaneous QHS   isosorbide mononitrate  60 mg Oral Daily   levothyroxine  75 mcg Oral Daily   rOPINIRole  0.5 mg Oral QHS   Continuous Infusions:   LOS: 3 days   Time spent:35 min  Irwin Brakeman, MD [facility]   If 7PM-7AM, please contact night-coverage 03/31/2021, 11:59 AM

## 2021-03-31 NOTE — Progress Notes (Signed)
Palliative: Chart review completed.  Conference with transition of care team. Patient is active with hospice of [facility].  Patient and family have elected private pay placement at [facility] for long-term care under private pay and continuation of hospice care.  Goals are set.  No needs at this time.  Conference with attending, transition of care team related to patient condition, needs, goals of care, disposition.  No charge Quinn Axe, NP Palliative medicine team Team phone [phone] Greater than 50% of this time was spent counseling and coordinating care related to the above assessment and plan.

## 2021-03-31 NOTE — TOC Progression Note (Addendum)
Transition of Care [facility]) - Progression Note    Patient Details  Name: Clayton Morton MRN: [id number] Date of Birth: 07/24/1932  Transition [facility]) CM/SW Contact  Boneta Lucks, RN Phone Number: 03/31/2021, 12:53 PM  Clinical Narrative:  [facility] will make a bed offer. Awaiting a PASSR#. Megan with [facility] hosp[facility] started, TOC left message for Jinny Blossom to follow up on PASSR. Patient will need a COVID test within 48 hours of discharge. MD/RN updated.   AddendumAnnia Friendly received  [phone] [id number]   Expected Discharge Plan: [place] (nursing home bed with hospice) Barriers to Discharge: Continued Medical Work up  Expected Discharge Plan and Services Expected Discharge Plan: [place] (nursing home bed with hospice)    Post Acute Care Choice: Hospice Living arrangements for the past 2 months: Single Family Home                    Readmission Risk Interventions Readmission Risk Prevention Plan 03/30/2021  Transportation Screening Complete  Home Care Screening Complete  Medication Review (RN CM) Complete  Some recent data might be hidden

## 2021-04-01 DIAGNOSIS — I2510 Atherosclerotic heart disease of native coronary artery without angina pectoris: Secondary | ICD-10-CM | POA: Diagnosis not present

## 2021-04-01 DIAGNOSIS — I5043 Acute on chronic combined systolic (congestive) and diastolic (congestive) heart failure: Secondary | ICD-10-CM | POA: Diagnosis not present

## 2021-04-01 DIAGNOSIS — I4510 Unspecified right bundle-branch block: Secondary | ICD-10-CM | POA: Diagnosis not present

## 2021-04-01 LAB — CBC WITH DIFFERENTIAL/PLATELET
Abs Immature Granulocytes: 0.03 10*3/uL (ref 0.00–0.07)
Basophils Absolute: 0 10*3/uL (ref 0.0–0.1)
Basophils Relative: 0 %
Eosinophils Absolute: 0.1 10*3/uL (ref 0.0–0.5)
Eosinophils Relative: 3 %
HCT: 30 % — ABNORMAL LOW (ref 39.0–52.0)
Hemoglobin: 9.7 g/dL — ABNORMAL LOW (ref 13.0–17.0)
Immature Granulocytes: 1 %
Lymphocytes Relative: 10 %
Lymphs Abs: 0.5 10*3/uL — ABNORMAL LOW (ref 0.7–4.0)
MCH: 32.9 pg (ref 26.0–34.0)
MCHC: 32.3 g/dL (ref 30.0–36.0)
MCV: 101.7 fL — ABNORMAL HIGH (ref 80.0–100.0)
Monocytes Absolute: 0.4 10*3/uL (ref 0.1–1.0)
Monocytes Relative: 9 %
Neutro Abs: 3.5 10*3/uL (ref 1.7–7.7)
Neutrophils Relative %: 77 %
Platelets: 127 10*3/uL — ABNORMAL LOW (ref 150–400)
RBC: 2.95 MIL/uL — ABNORMAL LOW (ref 4.22–5.81)
RDW: 17.6 % — ABNORMAL HIGH (ref 11.5–15.5)
WBC: 4.5 10*3/uL (ref 4.0–10.5)
nRBC: 0 % (ref 0.0–0.2)

## 2021-04-01 LAB — SARS CORONAVIRUS 2 (TAT 6-24 HRS): SARS Coronavirus 2: NEGATIVE

## 2021-04-01 LAB — COMPREHENSIVE METABOLIC PANEL
ALT: 15 U/L (ref 0–44)
AST: 10 U/L — ABNORMAL LOW (ref 15–41)
Albumin: 2.8 g/dL — ABNORMAL LOW (ref 3.5–5.0)
Alkaline Phosphatase: 60 U/L (ref 38–126)
Anion gap: 9 (ref 5–15)
BUN: 82 mg/dL — ABNORMAL HIGH (ref 8–23)
CO2: 28 mmol/L (ref 22–32)
Calcium: 8.1 mg/dL — ABNORMAL LOW (ref 8.9–10.3)
Chloride: 105 mmol/L (ref 98–111)
Creatinine, Ser: 3.89 mg/dL — ABNORMAL HIGH (ref 0.61–1.24)
GFR, Estimated: 14 mL/min — ABNORMAL LOW (ref >60)
Glucose, Bld: 162 mg/dL — ABNORMAL HIGH (ref 70–99)
Potassium: 3.6 mmol/L (ref 3.5–5.1)
Sodium: 142 mmol/L (ref 135–145)
Total Bilirubin: 0.5 mg/dL (ref 0.3–1.2)
Total Protein: 5.2 g/dL — ABNORMAL LOW (ref 6.5–8.1)

## 2021-04-01 LAB — GLUCOSE, CAPILLARY
Glucose-Capillary: 146 mg/dL — ABNORMAL HIGH (ref 70–99)
Glucose-Capillary: 221 mg/dL — ABNORMAL HIGH (ref 70–99)

## 2021-04-01 LAB — PHOSPHORUS: Phosphorus: 4.3 mg/dL (ref 2.5–4.6)

## 2021-04-01 LAB — MAGNESIUM: Magnesium: 2.5 mg/dL — ABNORMAL HIGH (ref 1.7–2.4)

## 2021-04-01 MED ORDER — TORSEMIDE 40 MG PO TABS
40.0000 mg | ORAL_TABLET | Freq: Every day | ORAL | Status: AC
Start: 2021-04-02 — End: ?

## 2021-04-01 MED ORDER — HYDRALAZINE HCL 10 MG PO TABS: 10.0000 mg | ORAL_TABLET | Freq: Three times a day (TID) | ORAL | 0 refills | Status: AC

## 2021-04-01 MED ORDER — ALPRAZOLAM 0.25 MG PO TABS: 0.2500 mg | ORAL_TABLET | Freq: Two times a day (BID) | ORAL | 0 refills | Status: AC | PRN

## 2021-04-01 MED ORDER — TORSEMIDE 20 MG PO TABS: 40.0000 mg | ORAL_TABLET | Freq: Every day | ORAL | Status: DC

## 2021-04-01 NOTE — TOC Transition Note (Signed)
Transition of Care [facility]) - CM/SW Discharge Note   Patient Details  Name: Clayton Morton MRN: [id number] Date of Birth: 01-06-1933  [facility]) CM/SW Contact:  Ihor Gully, LCSW Phone Number: 04/01/2021, 11:19 AM   Clinical Narrative:    Discharge clinicals sent to [facility]. Son, [name], notified of discharge. [facility] to transport. RN to call report. TOC signing off.   Final next level of care: Skilled Nursing Facility Barriers to Discharge: No Barriers Identified   Patient Goals and CMS Choice Patient states their goals for this hospitalization and ongoing recovery are:: needs placement CMS Medicare.gov Compare Post Acute Care list provided to:: Patient Represenative (must comment) Choice offered to / list presented to : Adult Children  Discharge Placement              Patient chooses bed at: Other - please specify in the comment section below: [facility] SNF) Patient to be transferred to facility by: [place] Name of family member notified: [name], son Patient and family notified of of transfer: 04/01/21  Discharge Plan and Services     Post Acute Care Choice: Hospice                               Social Determinants of Health (SDOH) Interventions     Readmission Risk Interventions Readmission Risk Prevention Plan 03/30/2021  Transportation Screening Complete  Home Care Screening Complete  Medication Review (RN CM) Complete  Some recent data might be hidden

## 2021-04-01 NOTE — Progress Notes (Signed)
No ICM remote transmission received for 03/30/2021 due to patient is currently hospitalized and next ICM transmission scheduled for 04/21/2021.

## 2021-04-01 NOTE — Progress Notes (Signed)
Patient discharged today transported to [facility] by EMS. Discharge paperwork given to EMS to give to [facility]. Belongings sent with patient. Patient stable at discharge.

## 2021-04-01 NOTE — Discharge Summary (Signed)
Physician Discharge Summary  HARMON BOMMARITO SHF:[id number] DOB: Jul 12, 1932 DOA: 03/28/2021  PCP: Glenda Chroman, MD  Admit date: 03/28/2021 Discharge date: 04/01/2021  Admitted From: Home Disposition:  SNF  Recommendations for Outpatient Follow-up:  Follow up with PCP in 1-2 weeks Please obtain BMP/CBC in one week     Discharge Condition: Stable CODE STATUS: DNR Diet recommendation: Heart Healthy / Carb Modified    Brief/Interim Summary: 88 y.o. WM PMHx  CHF-cardiomyopathy, AICD status, chronic renal failure,, diabetes mellitus, hypertension, chronic respiratory failure on 2 L.   Brought to the ED with reports of a fall.  Patient's step-son is at bedside and assists with the history.  Patient has a hospital bed, was trying to get up when he fell on his left shoulder.  Uses a walker for ambulation.  Over the past 2 weeks he has become more unsteady/shaky on his feet, they deny other falls.  Patient has also had some difficulty breathing recently, with increasing swelling involving his arm and bilateral lower extremities.  He has been compliant with Lasix, over the past week, he was told by his primary care provider, to double his Lasix dose for 3 days.  He took this without significant improvement.  No chest pain.  No black stools no blood in stools or vomiting of blood.   Patient has a caregiver, and hospice care at home.  But family is concerned about patient's increasing dependency and that the level of care he has currently is not enough.  Patient's outpatient provider was trying to arrange placement as an out patient.   ED Course: Temperature 98.  Heart rate 60s to 80s.  Respiratory rate 20-21.  Blood pressure systolic 10 2-1 29.  O2 sats 90 to 100% on room air. BNP markedly elevated at 2789.  Potassium 3.5.  Creatinine 3.54.  EKG suggesting atrial fibrillation. IV Lasix 80 mg x 1 given.  Hospitalist to admit for volume overload, fall.  Discharge Diagnoses:  Acute on chronic  systolic and diastolic CHF, AICD   -5/0/27 Echo--EF 20-25%, +WMA, moderate AS, mild MR -started on IV lasix 40 bid -NEG 6 lbs -BNP elevated at 2789.  Last echo 2020 EF 25 to 30% with impaired LV diastolic relaxation.  Reports compliance with Lasix 20 mg twice daily -Coreg 12.5 mg BID -Lasix 60 mg BID -Imdur 60 mg daily --hydralazine 10 mg tid   Essential HTN -stable, controlled    Coronary artery disease-  -History of multivessel disease status post CABG in 2005.  No chest pain.  EKG he appears to be in atrial fibrillation. -Resume aspirin, statins, Imdur   Swollen right upper extremity  - venous US right forearm--NEG DVT    Acute on chronic renal failure stage 4-(baseline Cr 3.1--7 months ago) -Likely cardiorenal syndrome -pt likely now has new baseline 3.5-3.8  Fall/Gait Instability -Recent increase in unsteady gait, increased risk of falls.  Shoulder x-ray without acute abnormality, suggests chronic rotator cuff arthropathy.  Lives at home, gets hospice care, family looking for more of an inpatient/long-term care. -Physical therapy and TOC evaluation -On low-dose benzos, clonazepam 0.5 nightly, and Ativan 0.25 daily -Swelling to left abdomen appears to be more of dependent edema, than trauma, monitor -Patient on home hospice per admission note both myself and TOC team unsure of why patient was admitted.  Plan now is to try to get SNF bed placement with hospice services.  - 1/8 Social worker planning for SNF bed placement at [facility] with hospice services.  Hypokalemia - Potassium goal> 4 - 1/8 K-Dur 40 mEq   Hyperlipidemia -continue statin  Discharge Instructions   Allergies as of 04/01/2021       Reactions   Tetracycline    Unknown reaction        Medication List     STOP taking these medications    clonazePAM 0.5 MG tablet Commonly known as: KLONOPIN   furosemide 20 MG tablet Commonly known as: LASIX       TAKE these medications    allopurinol  100 MG tablet Commonly known as: ZYLOPRIM Take 1 tablet by mouth daily.   ALPRAZolam 0.25 MG tablet Commonly known as: XANAX Take 1 tablet (0.25 mg total) by mouth 2 (two) times daily as needed for anxiety or sleep. What changed:  medication strength when to take this reasons to take this   aspirin 81 MG tablet Take 81 mg by mouth daily.   atorvastatin 80 MG tablet Commonly known as: LIPITOR take 1 tablet by mouth once daily   carvedilol 12.5 MG tablet Commonly known as: COREG TAKE ONE TABLET BY MOUTH TWICE DAILY. TAKE WITH A MEAL.   citalopram 20 MG tablet Commonly known as: CELEXA Take 20 mg by mouth daily.   CO Q 10 PO Take 1 tablet by mouth daily.   Fish Oil 1000 MG Caps Take 1 capsule by mouth 2 (two) times daily.   glipiZIDE 10 MG 24 hr tablet Commonly known as: GLUCOTROL XL Take 10 mg by mouth 2 (two) times daily.   hydrALAZINE 10 MG tablet Commonly known as: APRESOLINE Take 1 tablet (10 mg total) by mouth 3 (three) times daily. What changed:  medication strength how much to take   ICAPS AREDS FORMULA PO Take 1 tablet by mouth daily.   isosorbide mononitrate 60 MG 24 hr tablet Commonly known as: IMDUR TAKE ONE TABLET BY MOUTH DAILY   ONE TOUCH ULTRA TEST test strip Generic drug: glucose blood 1 each by Other route 2 (two) times daily.   repaglinide 1 MG tablet Commonly known as: PRANDIN Take 1 mg by mouth daily.   rOPINIRole 0.25 MG tablet Commonly known as: REQUIP Take 0.5 mg by mouth at bedtime.   Saw Palmetto 500 MG Caps Take 1 capsule by mouth daily.   Synthroid 75 MCG tablet Generic drug: levothyroxine Take 1 tablet by mouth daily.   Torsemide 40 MG Tabs Take 40 mg by mouth daily. Start taking on: April 02, 2021   Vitamin D 125 MCG (5000 UT) Caps Take 1 capsule by mouth daily.   VITAMIN E PO Take 1 tablet by mouth daily.        Contact information for after-discharge care     Destination     [facility]   Service: Skilled Nursing Contact information: [address] [address] [phone]                    Allergies  Allergen Reactions   Tetracycline     Unknown reaction     Consultations: palliative   Procedures/Studies: DG Chest 2 View  Result Date: 03/28/2021 CLINICAL DATA:  Fall.  Right shoulder pain radiating into the chest. EXAM: CHEST - 2 VIEW COMPARISON:  10/31/2014 FINDINGS: Post median sternotomy and CABG. Left-sided pacemaker in place. The heart is enlarged. Lower lung volumes from prior exam. There is a small right pleural effusion with fluid tracking into the minor fissure.  Associated right basilar opacity. No pneumothorax. Bronchovascular crowding versus vascular congestion. No acute rib fractures are seen IMPRESSION: 1. Small right pleural effusion with associated right basilar opacity. 2. Cardiomegaly with bronchovascular crowding versus vascular congestion. Electronically Signed   By: Keith Rake M.D.   On: 03/28/2021 17:21   DG Shoulder Right  Result Date: 03/28/2021 CLINICAL DATA:  Post fall with right shoulder pain. EXAM: RIGHT SHOULDER - 2+ VIEW COMPARISON:  None. FINDINGS: There is no evidence of fracture or dislocation. Mild acromioclavicular osteoarthritis. Small subacromial spur. There is superior subluxation of the humeral head. Soft tissues are unremarkable. IMPRESSION: 1. No fracture or dislocation of the right shoulder. 2. Mild acromioclavicular osteoarthritis. Small subacromial spur. 3. Superior subluxation of the humeral head suggests chronic rotator cuff arthropathy. Electronically Signed   By: Keith Rake M.D.   On: 03/28/2021 17:20   US Venous Img Upper Uni Right(DVT)  Result Date: 03/31/2021 CLINICAL DATA:  Edema, shortness of breath, post fall EXAM: RIGHT UPPER EXTREMITY VENOUS DOPPLER ULTRASOUND TECHNIQUE: Gray-scale sonography with graded compression, as  well as color Doppler and duplex ultrasound were performed to evaluate the upper extremity deep venous system from the level of the subclavian vein and including the jugular, axillary, basilic, radial, ulnar and upper cephalic vein. Spectral Doppler was utilized to evaluate flow at rest and with distal augmentation maneuvers. COMPARISON:  None. FINDINGS: Contralateral Subclavian Vein: Respiratory phasicity is normal and symmetric with the symptomatic side. No evidence of thrombus. Normal compressibility. Internal Jugular Vein: No evidence of thrombus. Normal compressibility, respiratory phasicity and response to augmentation. Subclavian Vein: No evidence of thrombus. Normal compressibility, respiratory phasicity and response to augmentation. Axillary Vein: No evidence of thrombus. Normal compressibility, respiratory phasicity and response to augmentation. Cephalic Vein: No evidence of thrombus. Normal compressibility, respiratory phasicity and response to augmentation. Basilic Vein: No evidence of thrombus. Normal compressibility, respiratory phasicity and response to augmentation. Brachial Veins: No evidence of thrombus. Normal compressibility, respiratory phasicity and response to augmentation. Radial Veins: No evidence of thrombus. Normal compressibility, respiratory phasicity and response to augmentation. Ulnar Veins: No evidence of thrombus. Normal compressibility, respiratory phasicity and response to augmentation. Venous Reflux:  None visualized. Other Findings:  Subcutaneous edema in the wrist. IMPRESSION: No evidence of DVT within the right upper extremity. Electronically Signed   By: Lucrezia Europe M.D.   On: 03/31/2021 14:38   ECHOCARDIOGRAM COMPLETE  Result Date: 03/29/2021    ECHOCARDIOGRAM REPORT   Patient Name:   ALASTAIR HENNES Date of Exam: 03/29/2021 Medical Rec #:  [id number]     Height:       68.0 in Accession #:    [id number]    Weight:       192.7 lb Date of Birth:  [birth year]    BSA:          2.012  m Patient Age:    4 years      BP:           116/65 mmHg Patient Gender: M             HR:           81 bpm. Exam Location:  [facility] Procedure: 2D Echo, Cardiac Doppler and Color Doppler Indications:    Fluid Overload  History:        Patient has prior history of Echocardiogram examinations, most                 recent 11/29/2018. CHF, CAD, Prior CABG  and Defibrillator,                 Arrythmias:RBBB; Risk Factors:Hypertension, Diabetes and                 Dyslipidemia.  Sonographer:    Wenda Low Referring Phys: [place]  1. Akinesis of the inferior and inferolateral walls with remaining walls hypokinetic; overall severe LV dysfunction.  2. Left ventricular ejection fraction, by estimation, is 20 to 25%. The left ventricle has severely decreased function. The left ventricle demonstrates regional wall motion abnormalities (see scoring diagram/findings for description). The left ventricular internal cavity size was mildly dilated. There is severe left ventricular hypertrophy. Left ventricular diastolic parameters are indeterminate.  3. Right ventricular systolic function is mildly reduced. The right ventricular size is mildly enlarged. There is mildly elevated pulmonary artery systolic pressure.  4. Left atrial size was severely dilated.  5. The mitral valve is normal in structure. Mild mitral valve regurgitation. No evidence of mitral stenosis.  6. The aortic valve is tricuspid. Aortic valve regurgitation is trivial. Moderate aortic valve stenosis.  7. The inferior vena cava is normal in size with greater than 50% respiratory variability, suggesting right atrial pressure of 3 mmHg. FINDINGS  Left Ventricle: Left ventricular ejection fraction, by estimation, is 20 to 25%. The left ventricle has severely decreased function. The left ventricle demonstrates regional wall motion abnormalities. Definity contrast agent was given IV to delineate the left ventricular endocardial  borders. The left ventricular internal cavity size was mildly dilated. There is severe left ventricular hypertrophy. Left ventricular diastolic parameters are indeterminate. Right Ventricle: The right ventricular size is mildly enlarged. Right ventricular systolic function is mildly reduced. There is mildly elevated pulmonary artery systolic pressure. The tricuspid regurgitant velocity is 2.74 m/s, and with an assumed right atrial pressure of 8 mmHg, the estimated right ventricular systolic pressure is 79.3 mmHg. Left Atrium: Left atrial size was severely dilated. Right Atrium: Right atrial size was normal in size. Pericardium: There is no evidence of pericardial effusion. Mitral Valve: The mitral valve is normal in structure. Mild mitral annular calcification. Mild mitral valve regurgitation. No evidence of mitral valve stenosis. MV peak gradient, 2.5 mmHg. The mean mitral valve gradient is 1.0 mmHg. Tricuspid Valve: The tricuspid valve is normal in structure. Tricuspid valve regurgitation is mild . No evidence of tricuspid stenosis. Aortic Valve: The aortic valve is tricuspid. Aortic valve regurgitation is trivial. Moderate aortic stenosis is present. Aortic valve mean gradient measures 9.5 mmHg. Aortic valve peak gradient measures 19.3 mmHg. Aortic valve area, by VTI measures 1.31 cm. Pulmonic Valve: The pulmonic valve was normal in structure. Pulmonic valve regurgitation is trivial. No evidence of pulmonic stenosis. Aorta: The aortic root is normal in size and structure. Venous: The inferior vena cava is normal in size with greater than 50% respiratory variability, suggesting right atrial pressure of 3 mmHg. IAS/Shunts: No atrial level shunt detected by color flow Doppler. Additional Comments: Akinesis of the inferior and inferolateral walls with remaining walls hypokinetic; overall severe LV dysfunction. A device lead is visualized.  LEFT VENTRICLE PLAX 2D LVIDd:         5.60 cm      Diastology LVIDs:          4.70 cm      LV e' lateral:   7.73 cm/s LV PW:         1.70 cm      LV E/e' lateral: 9.2 LV IVS:  2.00 cm LVOT diam:     2.20 cm LV SV:         62 LV SV Index:   31 LVOT Area:     3.80 cm  LV Volumes (MOD) LV vol d, MOD A2C: 244.0 ml LV vol d, MOD A4C: 172.0 ml LV vol s, MOD A2C: 187.0 ml LV vol s, MOD A4C: 128.0 ml LV SV MOD A2C:     57.0 ml LV SV MOD A4C:     172.0 ml LV SV MOD BP:      50.6 ml RIGHT VENTRICLE RV Basal diam:  4.25 cm RV Mid diam:    3.20 cm RV S prime:     5.64 cm/s LEFT ATRIUM              Index        RIGHT ATRIUM           Index LA diam:        4.90 cm  2.44 cm/m   RA Area:     21.00 cm LA Vol (A2C):   100.0 ml 49.71 ml/m  RA Volume:   64.30 ml  31.96 ml/m LA Vol (A4C):   108.0 ml 53.69 ml/m LA Biplane Vol: 105.0 ml 52.20 ml/m  AORTIC VALVE                     PULMONIC VALVE AV Area (Vmax):    1.21 cm      PV Vmax:       0.56 m/s AV Area (Vmean):   1.20 cm      PV Peak grad:  1.2 mmHg AV Area (VTI):     1.31 cm AV Vmax:           219.50 cm/s AV Vmean:          144.500 cm/s AV VTI:            0.475 m AV Peak Grad:      19.3 mmHg AV Mean Grad:      9.5 mmHg LVOT Vmax:         69.80 cm/s LVOT Vmean:        45.600 cm/s LVOT VTI:          0.163 m LVOT/AV VTI ratio: 0.34  AORTA Ao Root diam: 3.10 cm Ao Asc diam:  3.40 cm MITRAL VALVE               TRICUSPID VALVE MV Area (PHT): 3.50 cm    TR Peak grad:   30.0 mmHg MV Area VTI:   3.14 cm    TR Vmax:        274.00 cm/s MV Peak grad:  2.5 mmHg MV Mean grad:  1.0 mmHg    SHUNTS MV Vmax:       0.80 m/s    Systemic VTI:  0.16 m MV Vmean:      54.4 cm/s   Systemic Diam: 2.20 cm MV Decel Time: 217 msec MV E velocity: 71.10 cm/s MV A velocity: 54.00 cm/s MV E/A ratio:  1.32 Kirk Ruths MD Electronically signed by Kirk Ruths MD Signature Date/Time: 03/29/2021/10:48:22 AM    Final    DG Hips Bilat W or Wo Pelvis 2 Views  Result Date: 03/28/2021 CLINICAL DATA:  Fall.  Left hip pain. EXAM: DG HIP (WITH OR WITHOUT PELVIS) 2V BILAT  COMPARISON:  None. FINDINGS: The cortical margins of the bony pelvis are intact. No fracture. Pubic symphysis and  sacroiliac joints are congruent. Both femoral heads are well-seated in the respective acetabula. Moderate bilateral hip osteoarthritis. Bones are diffusely under mineralized. Lobulated soft tissue calcifications lateral to the right hip greater trochanter, may be granulomas or sequela of prior injury. Prominent vascular calcifications. IMPRESSION: 1. No fracture of the pelvis or hips. 2. Moderate bilateral hip osteoarthritis. Electronically Signed   By: Keith Rake M.D.   On: 03/28/2021 17:22        Discharge Exam: Vitals:   04/01/21 0617 04/01/21 0951  BP: (!) 144/72 127/83  Pulse: 71 85  Resp: 18   Temp: 97.9 F (36.6 C)   SpO2: 97%    Vitals:   03/31/21 1810 03/31/21 2157 04/01/21 0617 04/01/21 0951  BP: 137/88 113/65 (!) 144/72 127/83  Pulse: 77 65 71 85  Resp: 20 20 18    Temp: 97.9 F (36.6 C) 98 F (36.7 C) 97.9 F (36.6 C)   TempSrc: Oral Oral    SpO2: 100% 100% 97%   Weight:   84.6 kg   Height:   5\' 8"  (1.727 m)     General: Pt is alert, awake, not in acute distress Cardiovascular: RRR, S1/S2 +, no rubs, no gallops Respiratory: fine bibasilar rales Abdominal: Soft, NT, ND, bowel sounds + Extremities: trace LE edema, no cyanosis   The results of significant diagnostics from this hospitalization (including imaging, microbiology, ancillary and laboratory) are listed below for reference.    Significant Diagnostic Studies: DG Chest 2 View  Result Date: 03/28/2021 CLINICAL DATA:  Fall.  Right shoulder pain radiating into the chest. EXAM: CHEST - 2 VIEW COMPARISON:  10/31/2014 FINDINGS: Post median sternotomy and CABG. Left-sided pacemaker in place. The heart is enlarged. Lower lung volumes from prior exam. There is a small right pleural effusion with fluid tracking into the minor fissure. Associated right basilar opacity. No pneumothorax. Bronchovascular  crowding versus vascular congestion. No acute rib fractures are seen IMPRESSION: 1. Small right pleural effusion with associated right basilar opacity. 2. Cardiomegaly with bronchovascular crowding versus vascular congestion. Electronically Signed   By: Keith Rake M.D.   On: 03/28/2021 17:21   DG Shoulder Right  Result Date: 03/28/2021 CLINICAL DATA:  Post fall with right shoulder pain. EXAM: RIGHT SHOULDER - 2+ VIEW COMPARISON:  None. FINDINGS: There is no evidence of fracture or dislocation. Mild acromioclavicular osteoarthritis. Small subacromial spur. There is superior subluxation of the humeral head. Soft tissues are unremarkable. IMPRESSION: 1. No fracture or dislocation of the right shoulder. 2. Mild acromioclavicular osteoarthritis. Small subacromial spur. 3. Superior subluxation of the humeral head suggests chronic rotator cuff arthropathy. Electronically Signed   By: Keith Rake M.D.   On: 03/28/2021 17:20   US Venous Img Upper Uni Right(DVT)  Result Date: 03/31/2021 CLINICAL DATA:  Edema, shortness of breath, post fall EXAM: RIGHT UPPER EXTREMITY VENOUS DOPPLER ULTRASOUND TECHNIQUE: Gray-scale sonography with graded compression, as well as color Doppler and duplex ultrasound were performed to evaluate the upper extremity deep venous system from the level of the subclavian vein and including the jugular, axillary, basilic, radial, ulnar and upper cephalic vein. Spectral Doppler was utilized to evaluate flow at rest and with distal augmentation maneuvers. COMPARISON:  None. FINDINGS: Contralateral Subclavian Vein: Respiratory phasicity is normal and symmetric with the symptomatic side. No evidence of thrombus. Normal compressibility. Internal Jugular Vein: No evidence of thrombus. Normal compressibility, respiratory phasicity and response to augmentation. Subclavian Vein: No evidence of thrombus. Normal compressibility, respiratory phasicity and response to augmentation. Axillary Vein:  No  evidence of thrombus. Normal compressibility, respiratory phasicity and response to augmentation. Cephalic Vein: No evidence of thrombus. Normal compressibility, respiratory phasicity and response to augmentation. Basilic Vein: No evidence of thrombus. Normal compressibility, respiratory phasicity and response to augmentation. Brachial Veins: No evidence of thrombus. Normal compressibility, respiratory phasicity and response to augmentation. Radial Veins: No evidence of thrombus. Normal compressibility, respiratory phasicity and response to augmentation. Ulnar Veins: No evidence of thrombus. Normal compressibility, respiratory phasicity and response to augmentation. Venous Reflux:  None visualized. Other Findings:  Subcutaneous edema in the wrist. IMPRESSION: No evidence of DVT within the right upper extremity. Electronically Signed   By: Lucrezia Europe M.D.   On: 03/31/2021 14:38   ECHOCARDIOGRAM COMPLETE  Result Date: 03/29/2021    ECHOCARDIOGRAM REPORT   Patient Name:   GOLDMAN BIRCHALL Date of Exam: 03/29/2021 Medical Rec #:  [id number]     Height:       68.0 in Accession #:    [id number]    Weight:       192.7 lb Date of Birth:  Sep 02, 1932    BSA:          2.012 m Patient Age:    4 years      BP:           116/65 mmHg Patient Gender: M             HR:           81 bpm. Exam Location:  [facility] Procedure: 2D Echo, Cardiac Doppler and Color Doppler Indications:    Fluid Overload  History:        Patient has prior history of Echocardiogram examinations, most                 recent 11/29/2018. CHF, CAD, Prior CABG and Defibrillator,                 Arrythmias:RBBB; Risk Factors:Hypertension, Diabetes and                 Dyslipidemia.  Sonographer:    Wenda Low Referring Phys: [place]  1. Akinesis of the inferior and inferolateral walls with remaining walls hypokinetic; overall severe LV dysfunction.  2. Left ventricular ejection fraction, by estimation, is 20 to 25%. The left  ventricle has severely decreased function. The left ventricle demonstrates regional wall motion abnormalities (see scoring diagram/findings for description). The left ventricular internal cavity size was mildly dilated. There is severe left ventricular hypertrophy. Left ventricular diastolic parameters are indeterminate.  3. Right ventricular systolic function is mildly reduced. The right ventricular size is mildly enlarged. There is mildly elevated pulmonary artery systolic pressure.  4. Left atrial size was severely dilated.  5. The mitral valve is normal in structure. Mild mitral valve regurgitation. No evidence of mitral stenosis.  6. The aortic valve is tricuspid. Aortic valve regurgitation is trivial. Moderate aortic valve stenosis.  7. The inferior vena cava is normal in size with greater than 50% respiratory variability, suggesting right atrial pressure of 3 mmHg. FINDINGS  Left Ventricle: Left ventricular ejection fraction, by estimation, is 20 to 25%. The left ventricle has severely decreased function. The left ventricle demonstrates regional wall motion abnormalities. Definity contrast agent was given IV to delineate the left ventricular endocardial borders. The left ventricular internal cavity size was mildly dilated. There is severe left ventricular hypertrophy. Left ventricular diastolic parameters are indeterminate. Right Ventricle: The right ventricular size is mildly enlarged. Right  ventricular systolic function is mildly reduced. There is mildly elevated pulmonary artery systolic pressure. The tricuspid regurgitant velocity is 2.74 m/s, and with an assumed right atrial pressure of 8 mmHg, the estimated right ventricular systolic pressure is 23.5 mmHg. Left Atrium: Left atrial size was severely dilated. Right Atrium: Right atrial size was normal in size. Pericardium: There is no evidence of pericardial effusion. Mitral Valve: The mitral valve is normal in structure. Mild mitral annular  calcification. Mild mitral valve regurgitation. No evidence of mitral valve stenosis. MV peak gradient, 2.5 mmHg. The mean mitral valve gradient is 1.0 mmHg. Tricuspid Valve: The tricuspid valve is normal in structure. Tricuspid valve regurgitation is mild . No evidence of tricuspid stenosis. Aortic Valve: The aortic valve is tricuspid. Aortic valve regurgitation is trivial. Moderate aortic stenosis is present. Aortic valve mean gradient measures 9.5 mmHg. Aortic valve peak gradient measures 19.3 mmHg. Aortic valve area, by VTI measures 1.31 cm. Pulmonic Valve: The pulmonic valve was normal in structure. Pulmonic valve regurgitation is trivial. No evidence of pulmonic stenosis. Aorta: The aortic root is normal in size and structure. Venous: The inferior vena cava is normal in size with greater than 50% respiratory variability, suggesting right atrial pressure of 3 mmHg. IAS/Shunts: No atrial level shunt detected by color flow Doppler. Additional Comments: Akinesis of the inferior and inferolateral walls with remaining walls hypokinetic; overall severe LV dysfunction. A device lead is visualized.  LEFT VENTRICLE PLAX 2D LVIDd:         5.60 cm      Diastology LVIDs:         4.70 cm      LV e' lateral:   7.73 cm/s LV PW:         1.70 cm      LV E/e' lateral: 9.2 LV IVS:        2.00 cm LVOT diam:     2.20 cm LV SV:         62 LV SV Index:   31 LVOT Area:     3.80 cm  LV Volumes (MOD) LV vol d, MOD A2C: 244.0 ml LV vol d, MOD A4C: 172.0 ml LV vol s, MOD A2C: 187.0 ml LV vol s, MOD A4C: 128.0 ml LV SV MOD A2C:     57.0 ml LV SV MOD A4C:     172.0 ml LV SV MOD BP:      50.6 ml RIGHT VENTRICLE RV Basal diam:  4.25 cm RV Mid diam:    3.20 cm RV S prime:     5.64 cm/s LEFT ATRIUM              Index        RIGHT ATRIUM           Index LA diam:        4.90 cm  2.44 cm/m   RA Area:     21.00 cm LA Vol (A2C):   100.0 ml 49.71 ml/m  RA Volume:   64.30 ml  31.96 ml/m LA Vol (A4C):   108.0 ml 53.69 ml/m LA Biplane Vol: 105.0  ml 52.20 ml/m  AORTIC VALVE                     PULMONIC VALVE AV Area (Vmax):    1.21 cm      PV Vmax:       0.56 m/s AV Area (Vmean):   1.20 cm      PV Peak  grad:  1.2 mmHg AV Area (VTI):     1.31 cm AV Vmax:           219.50 cm/s AV Vmean:          144.500 cm/s AV VTI:            0.475 m AV Peak Grad:      19.3 mmHg AV Mean Grad:      9.5 mmHg LVOT Vmax:         69.80 cm/s LVOT Vmean:        45.600 cm/s LVOT VTI:          0.163 m LVOT/AV VTI ratio: 0.34  AORTA Ao Root diam: 3.10 cm Ao Asc diam:  3.40 cm MITRAL VALVE               TRICUSPID VALVE MV Area (PHT): 3.50 cm    TR Peak grad:   30.0 mmHg MV Area VTI:   3.14 cm    TR Vmax:        274.00 cm/s MV Peak grad:  2.5 mmHg MV Mean grad:  1.0 mmHg    SHUNTS MV Vmax:       0.80 m/s    Systemic VTI:  0.16 m MV Vmean:      54.4 cm/s   Systemic Diam: 2.20 cm MV Decel Time: 217 msec MV E velocity: 71.10 cm/s MV A velocity: 54.00 cm/s MV E/A ratio:  1.32 Kirk Ruths MD Electronically signed by Kirk Ruths MD Signature Date/Time: 03/29/2021/10:48:22 AM    Final    DG Hips Bilat W or Wo Pelvis 2 Views  Result Date: 03/28/2021 CLINICAL DATA:  Fall.  Left hip pain. EXAM: DG HIP (WITH OR WITHOUT PELVIS) 2V BILAT COMPARISON:  None. FINDINGS: The cortical margins of the bony pelvis are intact. No fracture. Pubic symphysis and sacroiliac joints are congruent. Both femoral heads are well-seated in the respective acetabula. Moderate bilateral hip osteoarthritis. Bones are diffusely under mineralized. Lobulated soft tissue calcifications lateral to the right hip greater trochanter, may be granulomas or sequela of prior injury. Prominent vascular calcifications. IMPRESSION: 1. No fracture of the pelvis or hips. 2. Moderate bilateral hip osteoarthritis. Electronically Signed   By: Keith Rake M.D.   On: 03/28/2021 17:22    Microbiology: Recent Results (from the past 240 hour(s))  Resp Panel by RT-PCR (Flu A&B, Covid) Nasopharyngeal Swab     Status: None    Collection Time: 03/28/21  4:03 PM   Specimen: Nasopharyngeal Swab; Nasopharyngeal(NP) swabs in vial transport medium  Result Value Ref Range Status   SARS Coronavirus 2 by RT PCR NEGATIVE NEGATIVE Final    Comment: (NOTE) SARS-CoV-2 target nucleic acids are NOT DETECTED.  The SARS-CoV-2 RNA is generally detectable in upper respiratory specimens during the acute phase of infection. The lowest concentration of SARS-CoV-2 viral copies this assay can detect is 138 copies/mL. A negative result does not preclude SARS-Cov-2 infection and should not be used as the sole basis for treatment or other patient management decisions. A negative result may occur with  improper specimen collection/handling, submission of specimen other than nasopharyngeal swab, presence of viral mutation(s) within the areas targeted by this assay, and inadequate number of viral copies(<138 copies/mL). A negative result must be combined with clinical observations, patient history, and epidemiological information. The expected result is Negative.  Fact Sheet for Patients:  EntrepreneurPulse.com.au  Fact Sheet for Healthcare Providers:  IncredibleEmployment.be  This test is no t yet approved or  cleared by the [place] and  has been authorized for detection and/or diagnosis of SARS-CoV-2 by FDA under an Emergency Use Authorization (EUA). This EUA will remain  in effect (meaning this test can be used) for the duration of the COVID-19 declaration under Section 564(b)(1) of the Act, 21 U.S.C.section 360bbb-3(b)(1), unless the authorization is terminated  or revoked sooner.       Influenza A by PCR NEGATIVE NEGATIVE Final   Influenza B by PCR NEGATIVE NEGATIVE Final    Comment: (NOTE) The Xpert Xpress SARS-CoV-2/FLU/RSV plus assay is intended as an aid in the diagnosis of influenza from Nasopharyngeal swab specimens and should not be used as a sole basis for treatment.  Nasal washings and aspirates are unacceptable for Xpert Xpress SARS-CoV-2/FLU/RSV testing.  Fact Sheet for Patients: EntrepreneurPulse.com.au  Fact Sheet for Healthcare Providers: IncredibleEmployment.be  This test is not yet approved or cleared by the [place] FDA and has been authorized for detection and/or diagnosis of SARS-CoV-2 by FDA under an Emergency Use Authorization (EUA). This EUA will remain in effect (meaning this test can be used) for the duration of the COVID-19 declaration under Section 564(b)(1) of the Act, 21 U.S.C. section 360bbb-3(b)(1), unless the authorization is terminated or revoked.  Performed at [facility], [address] [place], [place] [address]   SARS CORONAVIRUS 2 (Yama Nielson 6-24 HRS) Nasopharyngeal Nasopharyngeal Swab     Status: None   Collection Time: 03/31/21  2:52 PM   Specimen: Nasopharyngeal Swab  Result Value Ref Range Status   SARS Coronavirus 2 NEGATIVE NEGATIVE Final    Comment: (NOTE) SARS-CoV-2 target nucleic acids are NOT DETECTED.  The SARS-CoV-2 RNA is generally detectable in upper and lower respiratory specimens during the acute phase of infection. Negative results do not preclude SARS-CoV-2 infection, do not rule out co-infections with other pathogens, and should not be used as the sole basis for treatment or other patient management decisions. Negative results must be combined with clinical observations, patient history, and epidemiological information. The expected result is Negative.  Fact Sheet for Patients: SugarRoll.be  Fact Sheet for Healthcare Providers: https://www.woods-mathews.com/  This test is not yet approved or cleared by the [place] FDA and  has been authorized for detection and/or diagnosis of SARS-CoV-2 by FDA under an Emergency Use Authorization (EUA). This EUA will remain  in effect (meaning this test can be used) for the  duration of the COVID-19 declaration under Se ction 564(b)(1) of the Act, 21 U.S.C. section 360bbb-3(b)(1), unless the authorization is terminated or revoked sooner.  Performed at [facility], [place] [address], [place], [place] [address]      Labs: Basic Metabolic Panel: Recent Labs  Lab 03/28/21 1623 03/29/21 0532 03/30/21 0515 03/31/21 0542 04/01/21 0528  NA 142 145 143 141 142  K 3.5 3.3* 3.3* 3.4* 3.6  CL 102 104 103 103 105  CO2 28 29 27 27 28   GLUCOSE 311* 195* 95 129* 162*  BUN 65* 67* 73* 78* 82*  CREATININE 3.54* 3.48* 3.88* 3.77* 3.89*  CALCIUM 8.5* 8.5* 8.4* 8.3* 8.1*  MG 2.4  --  2.5* 2.4 2.5*  PHOS  --   --  4.3 4.4 4.3   Liver Function Tests: Recent Labs  Lab 03/28/21 1623 03/30/21 0515 03/31/21 0542 04/01/21 0528  AST 23 14* 12* 10*  ALT 17 15 16 15   ALKPHOS 79 56 62 60  BILITOT 0.6 0.9 0.9 0.5  PROT 5.8* 5.3* 5.4* 5.2*  ALBUMIN 3.1* 2.9*  2.8* 2.8*   No results for input(s): LIPASE, AMYLASE in the last 168 hours. No results for input(s): AMMONIA in the last 168 hours. CBC: Recent Labs  Lab 03/28/21 1623 03/29/21 0532 03/30/21 0515 03/31/21 0542 04/01/21 0528  WBC 7.2 5.5 5.2 4.8 4.5  NEUTROABS 6.4  --  4.1 3.7 3.5  HGB 10.6* 9.8* 9.9* 10.3* 9.7*  HCT 34.1* 31.0* 32.0* 32.4* 30.0*  MCV 102.1* 99.7 100.9* 101.6* 101.7*  PLT 122* 120* 120* 121* 127*   Cardiac Enzymes: No results for input(s): CKTOTAL, CKMB, CKMBINDEX, TROPONINI in the last 168 hours. BNP: Invalid input(s): POCBNP CBG: Recent Labs  Lab 03/31/21 0749 03/31/21 1228 03/31/21 1615 03/31/21 2201 04/01/21 0728  GLUCAP 120* 243* 209* 144* 146*    Time coordinating discharge:  36 minutes  Signed:  Orson Eva, DO [facility] Pager: [phone] 04/01/2021, 10:36 AM

## 2021-04-02 DIAGNOSIS — Z299 Encounter for prophylactic measures, unspecified: Secondary | ICD-10-CM | POA: Diagnosis not present

## 2021-04-02 DIAGNOSIS — E1165 Type 2 diabetes mellitus with hyperglycemia: Secondary | ICD-10-CM | POA: Diagnosis not present

## 2021-04-02 DIAGNOSIS — I509 Heart failure, unspecified: Secondary | ICD-10-CM | POA: Diagnosis not present

## 2021-04-02 DIAGNOSIS — I5022 Chronic systolic (congestive) heart failure: Secondary | ICD-10-CM | POA: Diagnosis not present

## 2021-04-02 DIAGNOSIS — I429 Cardiomyopathy, unspecified: Secondary | ICD-10-CM | POA: Diagnosis not present

## 2021-04-02 DIAGNOSIS — J9611 Chronic respiratory failure with hypoxia: Secondary | ICD-10-CM | POA: Diagnosis not present

## 2021-04-03 DIAGNOSIS — I509 Heart failure, unspecified: Secondary | ICD-10-CM | POA: Diagnosis not present

## 2021-04-03 DIAGNOSIS — Z95810 Presence of automatic (implantable) cardiac defibrillator: Secondary | ICD-10-CM | POA: Diagnosis not present

## 2021-04-03 DIAGNOSIS — I639 Cerebral infarction, unspecified: Secondary | ICD-10-CM | POA: Diagnosis not present

## 2021-04-03 DIAGNOSIS — N184 Chronic kidney disease, stage 4 (severe): Secondary | ICD-10-CM | POA: Diagnosis not present

## 2021-04-03 DIAGNOSIS — I2510 Atherosclerotic heart disease of native coronary artery without angina pectoris: Secondary | ICD-10-CM | POA: Diagnosis not present

## 2021-04-03 DIAGNOSIS — E118 Type 2 diabetes mellitus with unspecified complications: Secondary | ICD-10-CM | POA: Diagnosis not present

## 2021-04-07 DIAGNOSIS — I639 Cerebral infarction, unspecified: Secondary | ICD-10-CM | POA: Diagnosis not present

## 2021-04-07 DIAGNOSIS — I509 Heart failure, unspecified: Secondary | ICD-10-CM | POA: Diagnosis not present

## 2021-04-07 DIAGNOSIS — N184 Chronic kidney disease, stage 4 (severe): Secondary | ICD-10-CM | POA: Diagnosis not present

## 2021-04-07 DIAGNOSIS — E118 Type 2 diabetes mellitus with unspecified complications: Secondary | ICD-10-CM | POA: Diagnosis not present

## 2021-04-07 DIAGNOSIS — I2510 Atherosclerotic heart disease of native coronary artery without angina pectoris: Secondary | ICD-10-CM | POA: Diagnosis not present

## 2021-04-07 DIAGNOSIS — Z95810 Presence of automatic (implantable) cardiac defibrillator: Secondary | ICD-10-CM | POA: Diagnosis not present

## 2021-04-08 DIAGNOSIS — Z95810 Presence of automatic (implantable) cardiac defibrillator: Secondary | ICD-10-CM | POA: Diagnosis not present

## 2021-04-08 DIAGNOSIS — I2510 Atherosclerotic heart disease of native coronary artery without angina pectoris: Secondary | ICD-10-CM | POA: Diagnosis not present

## 2021-04-08 DIAGNOSIS — N184 Chronic kidney disease, stage 4 (severe): Secondary | ICD-10-CM | POA: Diagnosis not present

## 2021-04-08 DIAGNOSIS — E118 Type 2 diabetes mellitus with unspecified complications: Secondary | ICD-10-CM | POA: Diagnosis not present

## 2021-04-08 DIAGNOSIS — I509 Heart failure, unspecified: Secondary | ICD-10-CM | POA: Diagnosis not present

## 2021-04-08 DIAGNOSIS — I639 Cerebral infarction, unspecified: Secondary | ICD-10-CM | POA: Diagnosis not present

## 2021-04-09 DIAGNOSIS — I509 Heart failure, unspecified: Secondary | ICD-10-CM | POA: Diagnosis not present

## 2021-04-09 DIAGNOSIS — E118 Type 2 diabetes mellitus with unspecified complications: Secondary | ICD-10-CM | POA: Diagnosis not present

## 2021-04-09 DIAGNOSIS — I2510 Atherosclerotic heart disease of native coronary artery without angina pectoris: Secondary | ICD-10-CM | POA: Diagnosis not present

## 2021-04-09 DIAGNOSIS — N184 Chronic kidney disease, stage 4 (severe): Secondary | ICD-10-CM | POA: Diagnosis not present

## 2021-04-09 DIAGNOSIS — Z95810 Presence of automatic (implantable) cardiac defibrillator: Secondary | ICD-10-CM | POA: Diagnosis not present

## 2021-04-09 DIAGNOSIS — I639 Cerebral infarction, unspecified: Secondary | ICD-10-CM | POA: Diagnosis not present

## 2021-04-10 DIAGNOSIS — Z95810 Presence of automatic (implantable) cardiac defibrillator: Secondary | ICD-10-CM | POA: Diagnosis not present

## 2021-04-10 DIAGNOSIS — I2510 Atherosclerotic heart disease of native coronary artery without angina pectoris: Secondary | ICD-10-CM | POA: Diagnosis not present

## 2021-04-10 DIAGNOSIS — I509 Heart failure, unspecified: Secondary | ICD-10-CM | POA: Diagnosis not present

## 2021-04-10 DIAGNOSIS — E118 Type 2 diabetes mellitus with unspecified complications: Secondary | ICD-10-CM | POA: Diagnosis not present

## 2021-04-10 DIAGNOSIS — N184 Chronic kidney disease, stage 4 (severe): Secondary | ICD-10-CM | POA: Diagnosis not present

## 2021-04-10 DIAGNOSIS — I639 Cerebral infarction, unspecified: Secondary | ICD-10-CM | POA: Diagnosis not present

## 2021-04-17 DIAGNOSIS — I2510 Atherosclerotic heart disease of native coronary artery without angina pectoris: Secondary | ICD-10-CM | POA: Diagnosis not present

## 2021-04-17 DIAGNOSIS — I509 Heart failure, unspecified: Secondary | ICD-10-CM | POA: Diagnosis not present

## 2021-04-17 DIAGNOSIS — E118 Type 2 diabetes mellitus with unspecified complications: Secondary | ICD-10-CM | POA: Diagnosis not present

## 2021-04-17 DIAGNOSIS — I639 Cerebral infarction, unspecified: Secondary | ICD-10-CM | POA: Diagnosis not present

## 2021-04-17 DIAGNOSIS — N184 Chronic kidney disease, stage 4 (severe): Secondary | ICD-10-CM | POA: Diagnosis not present

## 2021-04-17 DIAGNOSIS — Z95810 Presence of automatic (implantable) cardiac defibrillator: Secondary | ICD-10-CM | POA: Diagnosis not present

## 2021-04-22 DIAGNOSIS — E118 Type 2 diabetes mellitus with unspecified complications: Secondary | ICD-10-CM | POA: Diagnosis not present

## 2021-04-22 DIAGNOSIS — I214 Non-ST elevation (NSTEMI) myocardial infarction: Secondary | ICD-10-CM | POA: Diagnosis not present

## 2021-04-22 DIAGNOSIS — R531 Weakness: Secondary | ICD-10-CM | POA: Diagnosis not present

## 2021-04-22 DIAGNOSIS — Z95810 Presence of automatic (implantable) cardiac defibrillator: Secondary | ICD-10-CM | POA: Diagnosis not present

## 2021-04-22 DIAGNOSIS — I255 Ischemic cardiomyopathy: Secondary | ICD-10-CM | POA: Diagnosis not present

## 2021-04-22 DIAGNOSIS — I10 Essential (primary) hypertension: Secondary | ICD-10-CM | POA: Diagnosis not present

## 2021-04-22 DIAGNOSIS — I509 Heart failure, unspecified: Secondary | ICD-10-CM | POA: Diagnosis not present

## 2021-04-22 DIAGNOSIS — R609 Edema, unspecified: Secondary | ICD-10-CM | POA: Diagnosis not present

## 2021-04-22 DIAGNOSIS — M109 Gout, unspecified: Secondary | ICD-10-CM | POA: Diagnosis not present

## 2021-04-22 DIAGNOSIS — N184 Chronic kidney disease, stage 4 (severe): Secondary | ICD-10-CM | POA: Diagnosis not present

## 2021-04-22 DIAGNOSIS — I2510 Atherosclerotic heart disease of native coronary artery without angina pectoris: Secondary | ICD-10-CM | POA: Diagnosis not present

## 2021-04-22 DIAGNOSIS — E785 Hyperlipidemia, unspecified: Secondary | ICD-10-CM | POA: Diagnosis not present

## 2021-04-22 DIAGNOSIS — I639 Cerebral infarction, unspecified: Secondary | ICD-10-CM | POA: Diagnosis not present

## 2021-04-23 ENCOUNTER — Telehealth: Payer: Self-pay

## 2021-04-23 NOTE — Telephone Encounter (Signed)
LMOVM for patient to send missed ICM transmission. 

## 2021-04-24 DIAGNOSIS — Z95810 Presence of automatic (implantable) cardiac defibrillator: Secondary | ICD-10-CM | POA: Diagnosis not present

## 2021-04-24 DIAGNOSIS — E118 Type 2 diabetes mellitus with unspecified complications: Secondary | ICD-10-CM | POA: Diagnosis not present

## 2021-04-24 DIAGNOSIS — I509 Heart failure, unspecified: Secondary | ICD-10-CM | POA: Diagnosis not present

## 2021-04-24 DIAGNOSIS — N184 Chronic kidney disease, stage 4 (severe): Secondary | ICD-10-CM | POA: Diagnosis not present

## 2021-04-24 DIAGNOSIS — I2510 Atherosclerotic heart disease of native coronary artery without angina pectoris: Secondary | ICD-10-CM | POA: Diagnosis not present

## 2021-04-24 DIAGNOSIS — I639 Cerebral infarction, unspecified: Secondary | ICD-10-CM | POA: Diagnosis not present

## 2021-04-24 NOTE — Progress Notes (Signed)
No ICM remote transmission received for 04/21/2021 and next ICM transmission scheduled for 05/18/2021.

## 2021-04-27 ENCOUNTER — Telehealth: Payer: Self-pay

## 2021-04-27 DIAGNOSIS — N184 Chronic kidney disease, stage 4 (severe): Secondary | ICD-10-CM | POA: Diagnosis not present

## 2021-04-27 DIAGNOSIS — E118 Type 2 diabetes mellitus with unspecified complications: Secondary | ICD-10-CM | POA: Diagnosis not present

## 2021-04-27 DIAGNOSIS — I509 Heart failure, unspecified: Secondary | ICD-10-CM | POA: Diagnosis not present

## 2021-04-27 DIAGNOSIS — Z95810 Presence of automatic (implantable) cardiac defibrillator: Secondary | ICD-10-CM | POA: Diagnosis not present

## 2021-04-27 DIAGNOSIS — I639 Cerebral infarction, unspecified: Secondary | ICD-10-CM | POA: Diagnosis not present

## 2021-04-27 DIAGNOSIS — I2510 Atherosclerotic heart disease of native coronary artery without angina pectoris: Secondary | ICD-10-CM | POA: Diagnosis not present

## 2021-04-27 NOTE — Telephone Encounter (Signed)
Rodena Piety patient daughter called to let us know patient is in a nursing home and does not have his monitor with him. She states she will call when she is with him as she is not on the dpr so we have approval to speak with her. She states patient wants to turn off his icd. She is aware we cannot talk to her and she will call back when with the patient

## 2021-04-27 NOTE — Telephone Encounter (Signed)
The patient daughter asked me could I add her to the dpr? I told her that the patient will have to sign the paper when he come into the next office visit in order for her to be on the dpr. She states there will not be another office visit because the patient is now in the nursing home at [facility] in [facility]. The phone number is [phone]. She states she will take the monitor to the facility tomorrow and call to get help sending the transmission.

## 2021-04-28 ENCOUNTER — Ambulatory Visit (INDEPENDENT_AMBULATORY_CARE_PROVIDER_SITE_OTHER): Payer: Medicare Other

## 2021-04-28 DIAGNOSIS — I255 Ischemic cardiomyopathy: Secondary | ICD-10-CM

## 2021-04-28 LAB — CUP PACEART REMOTE DEVICE CHECK
Battery Remaining Longevity: 26 mo
Battery Remaining Percentage: 27 %
Battery Voltage: 2.81 V
Brady Statistic AP VP Percent: 1.6 %
Brady Statistic AP VS Percent: 13 %
Brady Statistic AS VP Percent: 1 %
Brady Statistic AS VS Percent: 82 %
Brady Statistic RA Percent Paced: 9.4 %
Brady Statistic RV Percent Paced: 1.8 %
Date Time Interrogation Session: 20230206170119
HighPow Impedance: 52 Ohm
HighPow Impedance: 52 Ohm
Implantable Lead Implant Date: 20160810
Implantable Lead Implant Date: 20160810
Implantable Lead Location: 753859
Implantable Lead Location: 753860
Implantable Pulse Generator Implant Date: 20160810
Lead Channel Impedance Value: 310 Ohm
Lead Channel Impedance Value: 390 Ohm
Lead Channel Pacing Threshold Amplitude: 1 V
Lead Channel Pacing Threshold Amplitude: 3.625 V
Lead Channel Pacing Threshold Pulse Width: 0.5 ms
Lead Channel Pacing Threshold Pulse Width: 0.5 ms
Lead Channel Sensing Intrinsic Amplitude: 1.2 mV
Lead Channel Sensing Intrinsic Amplitude: 7.3 mV
Lead Channel Setting Pacing Amplitude: 2 V
Lead Channel Setting Pacing Amplitude: 3.875
Lead Channel Setting Pacing Pulse Width: 0.5 ms
Lead Channel Setting Sensing Sensitivity: 0.5 mV
Pulse Gen Serial Number: 7179751

## 2021-04-29 DIAGNOSIS — N184 Chronic kidney disease, stage 4 (severe): Secondary | ICD-10-CM | POA: Diagnosis not present

## 2021-04-29 DIAGNOSIS — I2510 Atherosclerotic heart disease of native coronary artery without angina pectoris: Secondary | ICD-10-CM | POA: Diagnosis not present

## 2021-04-29 DIAGNOSIS — I509 Heart failure, unspecified: Secondary | ICD-10-CM | POA: Diagnosis not present

## 2021-04-29 DIAGNOSIS — Z95810 Presence of automatic (implantable) cardiac defibrillator: Secondary | ICD-10-CM | POA: Diagnosis not present

## 2021-04-29 DIAGNOSIS — E118 Type 2 diabetes mellitus with unspecified complications: Secondary | ICD-10-CM | POA: Diagnosis not present

## 2021-04-29 DIAGNOSIS — I639 Cerebral infarction, unspecified: Secondary | ICD-10-CM | POA: Diagnosis not present

## 2021-05-01 DIAGNOSIS — I639 Cerebral infarction, unspecified: Secondary | ICD-10-CM | POA: Diagnosis not present

## 2021-05-01 DIAGNOSIS — I509 Heart failure, unspecified: Secondary | ICD-10-CM | POA: Diagnosis not present

## 2021-05-01 DIAGNOSIS — E118 Type 2 diabetes mellitus with unspecified complications: Secondary | ICD-10-CM | POA: Diagnosis not present

## 2021-05-01 DIAGNOSIS — N184 Chronic kidney disease, stage 4 (severe): Secondary | ICD-10-CM | POA: Diagnosis not present

## 2021-05-01 DIAGNOSIS — I2510 Atherosclerotic heart disease of native coronary artery without angina pectoris: Secondary | ICD-10-CM | POA: Diagnosis not present

## 2021-05-01 DIAGNOSIS — Z95810 Presence of automatic (implantable) cardiac defibrillator: Secondary | ICD-10-CM | POA: Diagnosis not present

## 2021-05-01 NOTE — Progress Notes (Signed)
Remote ICD transmission.   

## 2021-05-05 DIAGNOSIS — G319 Degenerative disease of nervous system, unspecified: Secondary | ICD-10-CM | POA: Diagnosis not present

## 2021-05-05 DIAGNOSIS — I2510 Atherosclerotic heart disease of native coronary artery without angina pectoris: Secondary | ICD-10-CM | POA: Diagnosis not present

## 2021-05-05 DIAGNOSIS — I639 Cerebral infarction, unspecified: Secondary | ICD-10-CM | POA: Diagnosis not present

## 2021-05-05 DIAGNOSIS — E118 Type 2 diabetes mellitus with unspecified complications: Secondary | ICD-10-CM | POA: Diagnosis not present

## 2021-05-05 DIAGNOSIS — Z95810 Presence of automatic (implantable) cardiac defibrillator: Secondary | ICD-10-CM | POA: Diagnosis not present

## 2021-05-05 DIAGNOSIS — Z299 Encounter for prophylactic measures, unspecified: Secondary | ICD-10-CM | POA: Diagnosis not present

## 2021-05-05 DIAGNOSIS — N184 Chronic kidney disease, stage 4 (severe): Secondary | ICD-10-CM | POA: Diagnosis not present

## 2021-05-05 DIAGNOSIS — I509 Heart failure, unspecified: Secondary | ICD-10-CM | POA: Diagnosis not present

## 2021-05-05 DIAGNOSIS — F419 Anxiety disorder, unspecified: Secondary | ICD-10-CM | POA: Diagnosis not present

## 2021-05-06 DIAGNOSIS — I639 Cerebral infarction, unspecified: Secondary | ICD-10-CM | POA: Diagnosis not present

## 2021-05-06 DIAGNOSIS — I2510 Atherosclerotic heart disease of native coronary artery without angina pectoris: Secondary | ICD-10-CM | POA: Diagnosis not present

## 2021-05-06 DIAGNOSIS — I509 Heart failure, unspecified: Secondary | ICD-10-CM | POA: Diagnosis not present

## 2021-05-06 DIAGNOSIS — E118 Type 2 diabetes mellitus with unspecified complications: Secondary | ICD-10-CM | POA: Diagnosis not present

## 2021-05-06 DIAGNOSIS — Z95810 Presence of automatic (implantable) cardiac defibrillator: Secondary | ICD-10-CM | POA: Diagnosis not present

## 2021-05-06 DIAGNOSIS — N184 Chronic kidney disease, stage 4 (severe): Secondary | ICD-10-CM | POA: Diagnosis not present

## 2021-05-12 DIAGNOSIS — Z299 Encounter for prophylactic measures, unspecified: Secondary | ICD-10-CM | POA: Diagnosis not present

## 2021-05-12 DIAGNOSIS — F419 Anxiety disorder, unspecified: Secondary | ICD-10-CM | POA: Diagnosis not present

## 2021-05-12 DIAGNOSIS — I509 Heart failure, unspecified: Secondary | ICD-10-CM | POA: Diagnosis not present

## 2021-05-12 DIAGNOSIS — I10 Essential (primary) hypertension: Secondary | ICD-10-CM | POA: Diagnosis not present

## 2021-05-13 ENCOUNTER — Telehealth: Payer: Self-pay

## 2021-05-13 DIAGNOSIS — I639 Cerebral infarction, unspecified: Secondary | ICD-10-CM | POA: Diagnosis not present

## 2021-05-13 DIAGNOSIS — I2510 Atherosclerotic heart disease of native coronary artery without angina pectoris: Secondary | ICD-10-CM | POA: Diagnosis not present

## 2021-05-13 DIAGNOSIS — Z95810 Presence of automatic (implantable) cardiac defibrillator: Secondary | ICD-10-CM | POA: Diagnosis not present

## 2021-05-13 DIAGNOSIS — N184 Chronic kidney disease, stage 4 (severe): Secondary | ICD-10-CM | POA: Diagnosis not present

## 2021-05-13 DIAGNOSIS — I509 Heart failure, unspecified: Secondary | ICD-10-CM | POA: Diagnosis not present

## 2021-05-13 DIAGNOSIS — E118 Type 2 diabetes mellitus with unspecified complications: Secondary | ICD-10-CM | POA: Diagnosis not present

## 2021-05-13 NOTE — Telephone Encounter (Signed)
LVM for patient to call device clinic regarding de-activating ICD therapies as patient is now on hospice care. Direct number to device clinic given.

## 2021-05-15 DIAGNOSIS — N184 Chronic kidney disease, stage 4 (severe): Secondary | ICD-10-CM | POA: Diagnosis not present

## 2021-05-15 DIAGNOSIS — I509 Heart failure, unspecified: Secondary | ICD-10-CM | POA: Diagnosis not present

## 2021-05-15 DIAGNOSIS — I2510 Atherosclerotic heart disease of native coronary artery without angina pectoris: Secondary | ICD-10-CM | POA: Diagnosis not present

## 2021-05-15 DIAGNOSIS — I639 Cerebral infarction, unspecified: Secondary | ICD-10-CM | POA: Diagnosis not present

## 2021-05-15 DIAGNOSIS — E118 Type 2 diabetes mellitus with unspecified complications: Secondary | ICD-10-CM | POA: Diagnosis not present

## 2021-05-15 DIAGNOSIS — Z95810 Presence of automatic (implantable) cardiac defibrillator: Secondary | ICD-10-CM | POA: Diagnosis not present

## 2021-05-18 ENCOUNTER — Ambulatory Visit (INDEPENDENT_AMBULATORY_CARE_PROVIDER_SITE_OTHER): Payer: Medicare Other

## 2021-05-18 DIAGNOSIS — I5022 Chronic systolic (congestive) heart failure: Secondary | ICD-10-CM | POA: Diagnosis not present

## 2021-05-18 DIAGNOSIS — Z95810 Presence of automatic (implantable) cardiac defibrillator: Secondary | ICD-10-CM | POA: Diagnosis not present

## 2021-05-18 NOTE — Telephone Encounter (Signed)
2nd attempt to reach pt/ family.  LVM on home answering machine requesting callback.

## 2021-05-19 NOTE — Progress Notes (Signed)
EPIC Encounter for ICM Monitoring  Patient Name: Clayton Morton is a 88 y.o. male Date: 05/19/2021 Primary Care Physican: Glenda Chroman, MD Primary Cardiologist: Domenic Polite Electrophysiologist: Allred Last Weight: 161 lbs   AT/AF Burden: <1%   Transmission reviewed.  No further ICM follow ups scheduled since patient is now in hospice.   CorVue Thoracic impedance suggesting possible fluid accumulation but trending back to baseline.   Prescribed:  Furosemide 20 mg take 1 tablet  (20 MG total) two times daily Hydrochlorothiazide 50 mg Take 50 mg by mouth every morning & 100 mg in the evening   Labs: 08/01/2020 Creatinine 3.17, BUN 54, Potassium 3.6, Sodium 142, GFR 18 A complete set of results can be found in Results Review.   Recommendations: No changes   Follow-up plan: No further ICM clinic phone appointments.   91 day device clinic remote transmission 07/28/2021.     EP/Cardiology Office Visits:   None   Copy of ICM check sent to [name].     3 month ICM trend: 05/18/2021.    12-14 Month ICM trend:     Rosalene Billings, RN 05/19/2021 1:52 PM

## 2021-05-20 DIAGNOSIS — E118 Type 2 diabetes mellitus with unspecified complications: Secondary | ICD-10-CM | POA: Diagnosis not present

## 2021-05-20 DIAGNOSIS — I10 Essential (primary) hypertension: Secondary | ICD-10-CM | POA: Diagnosis not present

## 2021-05-20 DIAGNOSIS — Z95810 Presence of automatic (implantable) cardiac defibrillator: Secondary | ICD-10-CM | POA: Diagnosis not present

## 2021-05-20 DIAGNOSIS — R609 Edema, unspecified: Secondary | ICD-10-CM | POA: Diagnosis not present

## 2021-05-20 DIAGNOSIS — R531 Weakness: Secondary | ICD-10-CM | POA: Diagnosis not present

## 2021-05-20 DIAGNOSIS — I639 Cerebral infarction, unspecified: Secondary | ICD-10-CM | POA: Diagnosis not present

## 2021-05-20 DIAGNOSIS — E785 Hyperlipidemia, unspecified: Secondary | ICD-10-CM | POA: Diagnosis not present

## 2021-05-20 DIAGNOSIS — I214 Non-ST elevation (NSTEMI) myocardial infarction: Secondary | ICD-10-CM | POA: Diagnosis not present

## 2021-05-20 DIAGNOSIS — M109 Gout, unspecified: Secondary | ICD-10-CM | POA: Diagnosis not present

## 2021-05-20 DIAGNOSIS — I2510 Atherosclerotic heart disease of native coronary artery without angina pectoris: Secondary | ICD-10-CM | POA: Diagnosis not present

## 2021-05-20 DIAGNOSIS — N184 Chronic kidney disease, stage 4 (severe): Secondary | ICD-10-CM | POA: Diagnosis not present

## 2021-05-20 DIAGNOSIS — I509 Heart failure, unspecified: Secondary | ICD-10-CM | POA: Diagnosis not present

## 2021-05-20 DIAGNOSIS — I255 Ischemic cardiomyopathy: Secondary | ICD-10-CM | POA: Diagnosis not present

## 2021-05-22 ENCOUNTER — Encounter: Payer: Medicare Other | Admitting: Internal Medicine

## 2021-05-22 DIAGNOSIS — Z95810 Presence of automatic (implantable) cardiac defibrillator: Secondary | ICD-10-CM | POA: Diagnosis not present

## 2021-05-22 DIAGNOSIS — E118 Type 2 diabetes mellitus with unspecified complications: Secondary | ICD-10-CM | POA: Diagnosis not present

## 2021-05-22 DIAGNOSIS — I2510 Atherosclerotic heart disease of native coronary artery without angina pectoris: Secondary | ICD-10-CM | POA: Diagnosis not present

## 2021-05-22 DIAGNOSIS — I509 Heart failure, unspecified: Secondary | ICD-10-CM | POA: Diagnosis not present

## 2021-05-22 DIAGNOSIS — N184 Chronic kidney disease, stage 4 (severe): Secondary | ICD-10-CM | POA: Diagnosis not present

## 2021-05-22 DIAGNOSIS — I639 Cerebral infarction, unspecified: Secondary | ICD-10-CM | POA: Diagnosis not present

## 2021-05-23 DIAGNOSIS — I2510 Atherosclerotic heart disease of native coronary artery without angina pectoris: Secondary | ICD-10-CM | POA: Diagnosis not present

## 2021-05-23 DIAGNOSIS — N184 Chronic kidney disease, stage 4 (severe): Secondary | ICD-10-CM | POA: Diagnosis not present

## 2021-05-23 DIAGNOSIS — E118 Type 2 diabetes mellitus with unspecified complications: Secondary | ICD-10-CM | POA: Diagnosis not present

## 2021-05-23 DIAGNOSIS — I509 Heart failure, unspecified: Secondary | ICD-10-CM | POA: Diagnosis not present

## 2021-05-23 DIAGNOSIS — Z95810 Presence of automatic (implantable) cardiac defibrillator: Secondary | ICD-10-CM | POA: Diagnosis not present

## 2021-05-23 DIAGNOSIS — I639 Cerebral infarction, unspecified: Secondary | ICD-10-CM | POA: Diagnosis not present

## 2021-05-27 DIAGNOSIS — I2510 Atherosclerotic heart disease of native coronary artery without angina pectoris: Secondary | ICD-10-CM | POA: Diagnosis not present

## 2021-05-27 DIAGNOSIS — Z95810 Presence of automatic (implantable) cardiac defibrillator: Secondary | ICD-10-CM | POA: Diagnosis not present

## 2021-05-27 DIAGNOSIS — N184 Chronic kidney disease, stage 4 (severe): Secondary | ICD-10-CM | POA: Diagnosis not present

## 2021-05-27 DIAGNOSIS — E118 Type 2 diabetes mellitus with unspecified complications: Secondary | ICD-10-CM | POA: Diagnosis not present

## 2021-05-27 DIAGNOSIS — I509 Heart failure, unspecified: Secondary | ICD-10-CM | POA: Diagnosis not present

## 2021-05-27 DIAGNOSIS — I639 Cerebral infarction, unspecified: Secondary | ICD-10-CM | POA: Diagnosis not present

## 2021-05-28 DIAGNOSIS — I639 Cerebral infarction, unspecified: Secondary | ICD-10-CM | POA: Diagnosis not present

## 2021-05-28 DIAGNOSIS — Z95810 Presence of automatic (implantable) cardiac defibrillator: Secondary | ICD-10-CM | POA: Diagnosis not present

## 2021-05-28 DIAGNOSIS — I509 Heart failure, unspecified: Secondary | ICD-10-CM | POA: Diagnosis not present

## 2021-05-28 DIAGNOSIS — N184 Chronic kidney disease, stage 4 (severe): Secondary | ICD-10-CM | POA: Diagnosis not present

## 2021-05-28 DIAGNOSIS — I2510 Atherosclerotic heart disease of native coronary artery without angina pectoris: Secondary | ICD-10-CM | POA: Diagnosis not present

## 2021-05-28 DIAGNOSIS — E118 Type 2 diabetes mellitus with unspecified complications: Secondary | ICD-10-CM | POA: Diagnosis not present

## 2021-06-03 ENCOUNTER — Telehealth: Payer: Self-pay | Admitting: Internal Medicine

## 2021-06-03 DIAGNOSIS — I639 Cerebral infarction, unspecified: Secondary | ICD-10-CM | POA: Diagnosis not present

## 2021-06-03 DIAGNOSIS — N184 Chronic kidney disease, stage 4 (severe): Secondary | ICD-10-CM | POA: Diagnosis not present

## 2021-06-03 DIAGNOSIS — I509 Heart failure, unspecified: Secondary | ICD-10-CM | POA: Diagnosis not present

## 2021-06-03 DIAGNOSIS — I2510 Atherosclerotic heart disease of native coronary artery without angina pectoris: Secondary | ICD-10-CM | POA: Diagnosis not present

## 2021-06-03 DIAGNOSIS — E118 Type 2 diabetes mellitus with unspecified complications: Secondary | ICD-10-CM | POA: Diagnosis not present

## 2021-06-03 DIAGNOSIS — Z95810 Presence of automatic (implantable) cardiac defibrillator: Secondary | ICD-10-CM | POA: Diagnosis not present

## 2021-06-03 NOTE — Telephone Encounter (Signed)
Discussed with [name]. Order received to turn off tachy therapies per son Ruben Reason) request. Will update industry and provided residence and contact for facility for coordination of care.  ?

## 2021-06-03 NOTE — Telephone Encounter (Signed)
Will send to device clinic. Looks like they have been trying to call patient. ?

## 2021-06-03 NOTE — Telephone Encounter (Signed)
Successful telephone encounter to patient's son Sol Blazing (on [place]) to follow up on patient's hospice status and request to turn off tachy therapies. Son states that is patient's and families wishes. States patient is on hospice care and a DNR. Advised that the RN had reached out to facility clinical [place] to discuss. Advised would need to secure order from hospice MD or [name] prior to turning off therapies. Once order received would contact industry to advise.  ?

## 2021-06-03 NOTE — Telephone Encounter (Signed)
Attempted to contact Harvel Quale, Clinical Supervisor at [facility] [place] to discuss patient status. Previous incoming call from family stating patient was on hospice care and wanted ICD therapies turned off. Have been unsuccessful at contacting patient. Hipaa compliant VM message left for [facility] requesting call back to discuss.  ?

## 2021-06-03 NOTE — Telephone Encounter (Signed)
Patient's family reports that our office has attempted to contact the patient on his home number but has not been able to reach the patient. ? ?The patient is now at [facility] facility. If our office needs to reach out to the facility, ask for [place]. She is the Unit Manager of the unit the patient is on  ?

## 2021-06-05 NOTE — Telephone Encounter (Signed)
Spoke to [place], advised that Orvis Stann received the verbal order so I will have her send a faxed copy of the order when she returns to work. Voiced understanding. ? ?Nevin Bloodgood confirmed that patients therapies are already off at this time.  ?

## 2021-06-05 NOTE — Telephone Encounter (Signed)
Harvel Quale a Buyer, retail at [facility] facility called to get a copy of the Order to turn off patient Therapies. Her fax number is [phone]. ?

## 2021-06-06 DIAGNOSIS — I509 Heart failure, unspecified: Secondary | ICD-10-CM | POA: Diagnosis not present

## 2021-06-06 DIAGNOSIS — I639 Cerebral infarction, unspecified: Secondary | ICD-10-CM | POA: Diagnosis not present

## 2021-06-06 DIAGNOSIS — I2510 Atherosclerotic heart disease of native coronary artery without angina pectoris: Secondary | ICD-10-CM | POA: Diagnosis not present

## 2021-06-06 DIAGNOSIS — Z95810 Presence of automatic (implantable) cardiac defibrillator: Secondary | ICD-10-CM | POA: Diagnosis not present

## 2021-06-06 DIAGNOSIS — E118 Type 2 diabetes mellitus with unspecified complications: Secondary | ICD-10-CM | POA: Diagnosis not present

## 2021-06-06 DIAGNOSIS — N184 Chronic kidney disease, stage 4 (severe): Secondary | ICD-10-CM | POA: Diagnosis not present

## 2021-06-10 DIAGNOSIS — I2510 Atherosclerotic heart disease of native coronary artery without angina pectoris: Secondary | ICD-10-CM | POA: Diagnosis not present

## 2021-06-10 DIAGNOSIS — I509 Heart failure, unspecified: Secondary | ICD-10-CM | POA: Diagnosis not present

## 2021-06-10 DIAGNOSIS — N184 Chronic kidney disease, stage 4 (severe): Secondary | ICD-10-CM | POA: Diagnosis not present

## 2021-06-10 DIAGNOSIS — Z95810 Presence of automatic (implantable) cardiac defibrillator: Secondary | ICD-10-CM | POA: Diagnosis not present

## 2021-06-10 DIAGNOSIS — E118 Type 2 diabetes mellitus with unspecified complications: Secondary | ICD-10-CM | POA: Diagnosis not present

## 2021-06-10 DIAGNOSIS — I639 Cerebral infarction, unspecified: Secondary | ICD-10-CM | POA: Diagnosis not present

## 2021-06-11 DIAGNOSIS — I639 Cerebral infarction, unspecified: Secondary | ICD-10-CM | POA: Diagnosis not present

## 2021-06-11 DIAGNOSIS — E118 Type 2 diabetes mellitus with unspecified complications: Secondary | ICD-10-CM | POA: Diagnosis not present

## 2021-06-11 DIAGNOSIS — I509 Heart failure, unspecified: Secondary | ICD-10-CM | POA: Diagnosis not present

## 2021-06-11 DIAGNOSIS — I2510 Atherosclerotic heart disease of native coronary artery without angina pectoris: Secondary | ICD-10-CM | POA: Diagnosis not present

## 2021-06-11 DIAGNOSIS — Z95810 Presence of automatic (implantable) cardiac defibrillator: Secondary | ICD-10-CM | POA: Diagnosis not present

## 2021-06-11 DIAGNOSIS — N184 Chronic kidney disease, stage 4 (severe): Secondary | ICD-10-CM | POA: Diagnosis not present

## 2021-06-17 DIAGNOSIS — I509 Heart failure, unspecified: Secondary | ICD-10-CM | POA: Diagnosis not present

## 2021-06-17 DIAGNOSIS — Z95810 Presence of automatic (implantable) cardiac defibrillator: Secondary | ICD-10-CM | POA: Diagnosis not present

## 2021-06-17 DIAGNOSIS — I2510 Atherosclerotic heart disease of native coronary artery without angina pectoris: Secondary | ICD-10-CM | POA: Diagnosis not present

## 2021-06-17 DIAGNOSIS — N184 Chronic kidney disease, stage 4 (severe): Secondary | ICD-10-CM | POA: Diagnosis not present

## 2021-06-17 DIAGNOSIS — I639 Cerebral infarction, unspecified: Secondary | ICD-10-CM | POA: Diagnosis not present

## 2021-06-17 DIAGNOSIS — E118 Type 2 diabetes mellitus with unspecified complications: Secondary | ICD-10-CM | POA: Diagnosis not present

## 2021-06-18 DIAGNOSIS — Z95810 Presence of automatic (implantable) cardiac defibrillator: Secondary | ICD-10-CM | POA: Diagnosis not present

## 2021-06-18 DIAGNOSIS — I509 Heart failure, unspecified: Secondary | ICD-10-CM | POA: Diagnosis not present

## 2021-06-18 DIAGNOSIS — E118 Type 2 diabetes mellitus with unspecified complications: Secondary | ICD-10-CM | POA: Diagnosis not present

## 2021-06-18 DIAGNOSIS — I639 Cerebral infarction, unspecified: Secondary | ICD-10-CM | POA: Diagnosis not present

## 2021-06-18 DIAGNOSIS — N184 Chronic kidney disease, stage 4 (severe): Secondary | ICD-10-CM | POA: Diagnosis not present

## 2021-06-18 DIAGNOSIS — I2510 Atherosclerotic heart disease of native coronary artery without angina pectoris: Secondary | ICD-10-CM | POA: Diagnosis not present

## 2021-06-20 DIAGNOSIS — M109 Gout, unspecified: Secondary | ICD-10-CM | POA: Diagnosis not present

## 2021-06-20 DIAGNOSIS — E118 Type 2 diabetes mellitus with unspecified complications: Secondary | ICD-10-CM | POA: Diagnosis not present

## 2021-06-20 DIAGNOSIS — N184 Chronic kidney disease, stage 4 (severe): Secondary | ICD-10-CM | POA: Diagnosis not present

## 2021-06-20 DIAGNOSIS — I639 Cerebral infarction, unspecified: Secondary | ICD-10-CM | POA: Diagnosis not present

## 2021-06-20 DIAGNOSIS — I2510 Atherosclerotic heart disease of native coronary artery without angina pectoris: Secondary | ICD-10-CM | POA: Diagnosis not present

## 2021-06-20 DIAGNOSIS — I509 Heart failure, unspecified: Secondary | ICD-10-CM | POA: Diagnosis not present

## 2021-06-20 DIAGNOSIS — I214 Non-ST elevation (NSTEMI) myocardial infarction: Secondary | ICD-10-CM | POA: Diagnosis not present

## 2021-06-20 DIAGNOSIS — I10 Essential (primary) hypertension: Secondary | ICD-10-CM | POA: Diagnosis not present

## 2021-06-20 DIAGNOSIS — R531 Weakness: Secondary | ICD-10-CM | POA: Diagnosis not present

## 2021-06-20 DIAGNOSIS — I255 Ischemic cardiomyopathy: Secondary | ICD-10-CM | POA: Diagnosis not present

## 2021-06-20 DIAGNOSIS — E785 Hyperlipidemia, unspecified: Secondary | ICD-10-CM | POA: Diagnosis not present

## 2021-06-20 DIAGNOSIS — R609 Edema, unspecified: Secondary | ICD-10-CM | POA: Diagnosis not present

## 2021-06-20 DIAGNOSIS — Z95810 Presence of automatic (implantable) cardiac defibrillator: Secondary | ICD-10-CM | POA: Diagnosis not present

## 2021-06-23 DIAGNOSIS — Z95810 Presence of automatic (implantable) cardiac defibrillator: Secondary | ICD-10-CM | POA: Diagnosis not present

## 2021-06-23 DIAGNOSIS — I2510 Atherosclerotic heart disease of native coronary artery without angina pectoris: Secondary | ICD-10-CM | POA: Diagnosis not present

## 2021-06-23 DIAGNOSIS — N184 Chronic kidney disease, stage 4 (severe): Secondary | ICD-10-CM | POA: Diagnosis not present

## 2021-06-23 DIAGNOSIS — I509 Heart failure, unspecified: Secondary | ICD-10-CM | POA: Diagnosis not present

## 2021-06-23 DIAGNOSIS — E118 Type 2 diabetes mellitus with unspecified complications: Secondary | ICD-10-CM | POA: Diagnosis not present

## 2021-06-23 DIAGNOSIS — I639 Cerebral infarction, unspecified: Secondary | ICD-10-CM | POA: Diagnosis not present

## 2021-06-24 DIAGNOSIS — I2510 Atherosclerotic heart disease of native coronary artery without angina pectoris: Secondary | ICD-10-CM | POA: Diagnosis not present

## 2021-06-24 DIAGNOSIS — I509 Heart failure, unspecified: Secondary | ICD-10-CM | POA: Diagnosis not present

## 2021-06-24 DIAGNOSIS — E118 Type 2 diabetes mellitus with unspecified complications: Secondary | ICD-10-CM | POA: Diagnosis not present

## 2021-06-24 DIAGNOSIS — I639 Cerebral infarction, unspecified: Secondary | ICD-10-CM | POA: Diagnosis not present

## 2021-06-24 DIAGNOSIS — N184 Chronic kidney disease, stage 4 (severe): Secondary | ICD-10-CM | POA: Diagnosis not present

## 2021-06-24 DIAGNOSIS — Z95810 Presence of automatic (implantable) cardiac defibrillator: Secondary | ICD-10-CM | POA: Diagnosis not present

## 2021-06-25 DIAGNOSIS — Z95810 Presence of automatic (implantable) cardiac defibrillator: Secondary | ICD-10-CM | POA: Diagnosis not present

## 2021-06-25 DIAGNOSIS — N184 Chronic kidney disease, stage 4 (severe): Secondary | ICD-10-CM | POA: Diagnosis not present

## 2021-06-25 DIAGNOSIS — I509 Heart failure, unspecified: Secondary | ICD-10-CM | POA: Diagnosis not present

## 2021-06-25 DIAGNOSIS — E118 Type 2 diabetes mellitus with unspecified complications: Secondary | ICD-10-CM | POA: Diagnosis not present

## 2021-06-25 DIAGNOSIS — I2510 Atherosclerotic heart disease of native coronary artery without angina pectoris: Secondary | ICD-10-CM | POA: Diagnosis not present

## 2021-06-25 DIAGNOSIS — I639 Cerebral infarction, unspecified: Secondary | ICD-10-CM | POA: Diagnosis not present

## 2021-06-26 DIAGNOSIS — Z95810 Presence of automatic (implantable) cardiac defibrillator: Secondary | ICD-10-CM | POA: Diagnosis not present

## 2021-06-26 DIAGNOSIS — I509 Heart failure, unspecified: Secondary | ICD-10-CM | POA: Diagnosis not present

## 2021-06-26 DIAGNOSIS — I2510 Atherosclerotic heart disease of native coronary artery without angina pectoris: Secondary | ICD-10-CM | POA: Diagnosis not present

## 2021-06-26 DIAGNOSIS — E118 Type 2 diabetes mellitus with unspecified complications: Secondary | ICD-10-CM | POA: Diagnosis not present

## 2021-06-26 DIAGNOSIS — N184 Chronic kidney disease, stage 4 (severe): Secondary | ICD-10-CM | POA: Diagnosis not present

## 2021-06-26 DIAGNOSIS — I639 Cerebral infarction, unspecified: Secondary | ICD-10-CM | POA: Diagnosis not present

## 2021-07-01 DIAGNOSIS — I509 Heart failure, unspecified: Secondary | ICD-10-CM | POA: Diagnosis not present

## 2021-07-01 DIAGNOSIS — Z95810 Presence of automatic (implantable) cardiac defibrillator: Secondary | ICD-10-CM | POA: Diagnosis not present

## 2021-07-01 DIAGNOSIS — I2510 Atherosclerotic heart disease of native coronary artery without angina pectoris: Secondary | ICD-10-CM | POA: Diagnosis not present

## 2021-07-01 DIAGNOSIS — N184 Chronic kidney disease, stage 4 (severe): Secondary | ICD-10-CM | POA: Diagnosis not present

## 2021-07-01 DIAGNOSIS — I639 Cerebral infarction, unspecified: Secondary | ICD-10-CM | POA: Diagnosis not present

## 2021-07-01 DIAGNOSIS — E118 Type 2 diabetes mellitus with unspecified complications: Secondary | ICD-10-CM | POA: Diagnosis not present

## 2021-07-05 DIAGNOSIS — Z95810 Presence of automatic (implantable) cardiac defibrillator: Secondary | ICD-10-CM | POA: Diagnosis not present

## 2021-07-05 DIAGNOSIS — I509 Heart failure, unspecified: Secondary | ICD-10-CM | POA: Diagnosis not present

## 2021-07-05 DIAGNOSIS — E118 Type 2 diabetes mellitus with unspecified complications: Secondary | ICD-10-CM | POA: Diagnosis not present

## 2021-07-05 DIAGNOSIS — N184 Chronic kidney disease, stage 4 (severe): Secondary | ICD-10-CM | POA: Diagnosis not present

## 2021-07-05 DIAGNOSIS — I639 Cerebral infarction, unspecified: Secondary | ICD-10-CM | POA: Diagnosis not present

## 2021-07-05 DIAGNOSIS — I2510 Atherosclerotic heart disease of native coronary artery without angina pectoris: Secondary | ICD-10-CM | POA: Diagnosis not present

## 2021-07-07 DIAGNOSIS — G319 Degenerative disease of nervous system, unspecified: Secondary | ICD-10-CM | POA: Diagnosis not present

## 2021-07-07 DIAGNOSIS — Z299 Encounter for prophylactic measures, unspecified: Secondary | ICD-10-CM | POA: Diagnosis not present

## 2021-07-07 DIAGNOSIS — R532 Functional quadriplegia: Secondary | ICD-10-CM | POA: Diagnosis not present

## 2021-07-07 DIAGNOSIS — Z515 Encounter for palliative care: Secondary | ICD-10-CM | POA: Diagnosis not present

## 2021-07-08 DIAGNOSIS — I639 Cerebral infarction, unspecified: Secondary | ICD-10-CM | POA: Diagnosis not present

## 2021-07-08 DIAGNOSIS — I509 Heart failure, unspecified: Secondary | ICD-10-CM | POA: Diagnosis not present

## 2021-07-08 DIAGNOSIS — I2510 Atherosclerotic heart disease of native coronary artery without angina pectoris: Secondary | ICD-10-CM | POA: Diagnosis not present

## 2021-07-08 DIAGNOSIS — Z95810 Presence of automatic (implantable) cardiac defibrillator: Secondary | ICD-10-CM | POA: Diagnosis not present

## 2021-07-08 DIAGNOSIS — E118 Type 2 diabetes mellitus with unspecified complications: Secondary | ICD-10-CM | POA: Diagnosis not present

## 2021-07-08 DIAGNOSIS — N184 Chronic kidney disease, stage 4 (severe): Secondary | ICD-10-CM | POA: Diagnosis not present

## 2021-07-13 DIAGNOSIS — E118 Type 2 diabetes mellitus with unspecified complications: Secondary | ICD-10-CM | POA: Diagnosis not present

## 2021-07-13 DIAGNOSIS — N184 Chronic kidney disease, stage 4 (severe): Secondary | ICD-10-CM | POA: Diagnosis not present

## 2021-07-13 DIAGNOSIS — I639 Cerebral infarction, unspecified: Secondary | ICD-10-CM | POA: Diagnosis not present

## 2021-07-13 DIAGNOSIS — Z95810 Presence of automatic (implantable) cardiac defibrillator: Secondary | ICD-10-CM | POA: Diagnosis not present

## 2021-07-13 DIAGNOSIS — I2510 Atherosclerotic heart disease of native coronary artery without angina pectoris: Secondary | ICD-10-CM | POA: Diagnosis not present

## 2021-07-13 DIAGNOSIS — I509 Heart failure, unspecified: Secondary | ICD-10-CM | POA: Diagnosis not present

## 2021-07-14 DIAGNOSIS — I2510 Atherosclerotic heart disease of native coronary artery without angina pectoris: Secondary | ICD-10-CM | POA: Diagnosis not present

## 2021-07-14 DIAGNOSIS — N184 Chronic kidney disease, stage 4 (severe): Secondary | ICD-10-CM | POA: Diagnosis not present

## 2021-07-14 DIAGNOSIS — I639 Cerebral infarction, unspecified: Secondary | ICD-10-CM | POA: Diagnosis not present

## 2021-07-14 DIAGNOSIS — I509 Heart failure, unspecified: Secondary | ICD-10-CM | POA: Diagnosis not present

## 2021-07-14 DIAGNOSIS — E118 Type 2 diabetes mellitus with unspecified complications: Secondary | ICD-10-CM | POA: Diagnosis not present

## 2021-07-14 DIAGNOSIS — Z95810 Presence of automatic (implantable) cardiac defibrillator: Secondary | ICD-10-CM | POA: Diagnosis not present

## 2021-07-15 DIAGNOSIS — Z95810 Presence of automatic (implantable) cardiac defibrillator: Secondary | ICD-10-CM | POA: Diagnosis not present

## 2021-07-15 DIAGNOSIS — E118 Type 2 diabetes mellitus with unspecified complications: Secondary | ICD-10-CM | POA: Diagnosis not present

## 2021-07-15 DIAGNOSIS — I509 Heart failure, unspecified: Secondary | ICD-10-CM | POA: Diagnosis not present

## 2021-07-15 DIAGNOSIS — I639 Cerebral infarction, unspecified: Secondary | ICD-10-CM | POA: Diagnosis not present

## 2021-07-15 DIAGNOSIS — N184 Chronic kidney disease, stage 4 (severe): Secondary | ICD-10-CM | POA: Diagnosis not present

## 2021-07-15 DIAGNOSIS — I2510 Atherosclerotic heart disease of native coronary artery without angina pectoris: Secondary | ICD-10-CM | POA: Diagnosis not present

## 2021-07-16 DIAGNOSIS — I639 Cerebral infarction, unspecified: Secondary | ICD-10-CM | POA: Diagnosis not present

## 2021-07-16 DIAGNOSIS — Z95810 Presence of automatic (implantable) cardiac defibrillator: Secondary | ICD-10-CM | POA: Diagnosis not present

## 2021-07-16 DIAGNOSIS — I509 Heart failure, unspecified: Secondary | ICD-10-CM | POA: Diagnosis not present

## 2021-07-16 DIAGNOSIS — I2510 Atherosclerotic heart disease of native coronary artery without angina pectoris: Secondary | ICD-10-CM | POA: Diagnosis not present

## 2021-07-16 DIAGNOSIS — N184 Chronic kidney disease, stage 4 (severe): Secondary | ICD-10-CM | POA: Diagnosis not present

## 2021-07-16 DIAGNOSIS — E118 Type 2 diabetes mellitus with unspecified complications: Secondary | ICD-10-CM | POA: Diagnosis not present

## 2021-07-20 DIAGNOSIS — R609 Edema, unspecified: Secondary | ICD-10-CM | POA: Diagnosis not present

## 2021-07-20 DIAGNOSIS — E118 Type 2 diabetes mellitus with unspecified complications: Secondary | ICD-10-CM | POA: Diagnosis not present

## 2021-07-20 DIAGNOSIS — I509 Heart failure, unspecified: Secondary | ICD-10-CM | POA: Diagnosis not present

## 2021-07-20 DIAGNOSIS — I255 Ischemic cardiomyopathy: Secondary | ICD-10-CM | POA: Diagnosis not present

## 2021-07-20 DIAGNOSIS — I10 Essential (primary) hypertension: Secondary | ICD-10-CM | POA: Diagnosis not present

## 2021-07-20 DIAGNOSIS — M109 Gout, unspecified: Secondary | ICD-10-CM | POA: Diagnosis not present

## 2021-07-20 DIAGNOSIS — N184 Chronic kidney disease, stage 4 (severe): Secondary | ICD-10-CM | POA: Diagnosis not present

## 2021-07-20 DIAGNOSIS — Z95810 Presence of automatic (implantable) cardiac defibrillator: Secondary | ICD-10-CM | POA: Diagnosis not present

## 2021-07-20 DIAGNOSIS — I214 Non-ST elevation (NSTEMI) myocardial infarction: Secondary | ICD-10-CM | POA: Diagnosis not present

## 2021-07-20 DIAGNOSIS — E785 Hyperlipidemia, unspecified: Secondary | ICD-10-CM | POA: Diagnosis not present

## 2021-07-20 DIAGNOSIS — I639 Cerebral infarction, unspecified: Secondary | ICD-10-CM | POA: Diagnosis not present

## 2021-07-20 DIAGNOSIS — R531 Weakness: Secondary | ICD-10-CM | POA: Diagnosis not present

## 2021-07-20 DIAGNOSIS — I2510 Atherosclerotic heart disease of native coronary artery without angina pectoris: Secondary | ICD-10-CM | POA: Diagnosis not present

## 2021-07-22 DIAGNOSIS — I509 Heart failure, unspecified: Secondary | ICD-10-CM | POA: Diagnosis not present

## 2021-07-22 DIAGNOSIS — I639 Cerebral infarction, unspecified: Secondary | ICD-10-CM | POA: Diagnosis not present

## 2021-07-22 DIAGNOSIS — N184 Chronic kidney disease, stage 4 (severe): Secondary | ICD-10-CM | POA: Diagnosis not present

## 2021-07-22 DIAGNOSIS — Z95810 Presence of automatic (implantable) cardiac defibrillator: Secondary | ICD-10-CM | POA: Diagnosis not present

## 2021-07-22 DIAGNOSIS — I2510 Atherosclerotic heart disease of native coronary artery without angina pectoris: Secondary | ICD-10-CM | POA: Diagnosis not present

## 2021-07-22 DIAGNOSIS — E118 Type 2 diabetes mellitus with unspecified complications: Secondary | ICD-10-CM | POA: Diagnosis not present

## 2021-07-23 DIAGNOSIS — I509 Heart failure, unspecified: Secondary | ICD-10-CM | POA: Diagnosis not present

## 2021-07-23 DIAGNOSIS — I2510 Atherosclerotic heart disease of native coronary artery without angina pectoris: Secondary | ICD-10-CM | POA: Diagnosis not present

## 2021-07-23 DIAGNOSIS — E118 Type 2 diabetes mellitus with unspecified complications: Secondary | ICD-10-CM | POA: Diagnosis not present

## 2021-07-23 DIAGNOSIS — Z95810 Presence of automatic (implantable) cardiac defibrillator: Secondary | ICD-10-CM | POA: Diagnosis not present

## 2021-07-23 DIAGNOSIS — N184 Chronic kidney disease, stage 4 (severe): Secondary | ICD-10-CM | POA: Diagnosis not present

## 2021-07-23 DIAGNOSIS — I639 Cerebral infarction, unspecified: Secondary | ICD-10-CM | POA: Diagnosis not present

## 2021-07-28 ENCOUNTER — Ambulatory Visit (INDEPENDENT_AMBULATORY_CARE_PROVIDER_SITE_OTHER): Payer: Medicare Other

## 2021-07-28 DIAGNOSIS — I255 Ischemic cardiomyopathy: Secondary | ICD-10-CM

## 2021-07-28 LAB — CUP PACEART REMOTE DEVICE CHECK
Battery Remaining Longevity: 16 mo
Battery Remaining Percentage: 19 %
Battery Voltage: 2.77 V
Brady Statistic AP VP Percent: 3.8 %
Brady Statistic AP VS Percent: 12 %
Brady Statistic AS VP Percent: 1 %
Brady Statistic AS VS Percent: 80 %
Brady Statistic RA Percent Paced: 11 %
Brady Statistic RV Percent Paced: 4.4 %
Date Time Interrogation Session: 20230509020016
HighPow Impedance: 45 Ohm
HighPow Impedance: 45 Ohm
Implantable Lead Implant Date: 20160810
Implantable Lead Implant Date: 20160810
Implantable Lead Location: 753859
Implantable Lead Location: 753860
Implantable Pulse Generator Implant Date: 20160810
Lead Channel Impedance Value: 290 Ohm
Lead Channel Impedance Value: 380 Ohm
Lead Channel Pacing Threshold Amplitude: 0.875 V
Lead Channel Pacing Threshold Pulse Width: 0.5 ms
Lead Channel Sensing Intrinsic Amplitude: 1.2 mV
Lead Channel Sensing Intrinsic Amplitude: 6.8 mV
Lead Channel Setting Pacing Amplitude: 1.875
Lead Channel Setting Pacing Amplitude: 5 V
Lead Channel Setting Pacing Pulse Width: 0.5 ms
Lead Channel Setting Sensing Sensitivity: 0.5 mV
Pulse Gen Serial Number: 7179751

## 2021-07-29 DIAGNOSIS — I639 Cerebral infarction, unspecified: Secondary | ICD-10-CM | POA: Diagnosis not present

## 2021-07-29 DIAGNOSIS — I509 Heart failure, unspecified: Secondary | ICD-10-CM | POA: Diagnosis not present

## 2021-07-29 DIAGNOSIS — Z95810 Presence of automatic (implantable) cardiac defibrillator: Secondary | ICD-10-CM | POA: Diagnosis not present

## 2021-07-29 DIAGNOSIS — I2510 Atherosclerotic heart disease of native coronary artery without angina pectoris: Secondary | ICD-10-CM | POA: Diagnosis not present

## 2021-07-29 DIAGNOSIS — E118 Type 2 diabetes mellitus with unspecified complications: Secondary | ICD-10-CM | POA: Diagnosis not present

## 2021-07-29 DIAGNOSIS — N184 Chronic kidney disease, stage 4 (severe): Secondary | ICD-10-CM | POA: Diagnosis not present

## 2021-07-30 DIAGNOSIS — I639 Cerebral infarction, unspecified: Secondary | ICD-10-CM | POA: Diagnosis not present

## 2021-07-30 DIAGNOSIS — I2510 Atherosclerotic heart disease of native coronary artery without angina pectoris: Secondary | ICD-10-CM | POA: Diagnosis not present

## 2021-07-30 DIAGNOSIS — E118 Type 2 diabetes mellitus with unspecified complications: Secondary | ICD-10-CM | POA: Diagnosis not present

## 2021-07-30 DIAGNOSIS — N184 Chronic kidney disease, stage 4 (severe): Secondary | ICD-10-CM | POA: Diagnosis not present

## 2021-07-30 DIAGNOSIS — I509 Heart failure, unspecified: Secondary | ICD-10-CM | POA: Diagnosis not present

## 2021-07-30 DIAGNOSIS — Z95810 Presence of automatic (implantable) cardiac defibrillator: Secondary | ICD-10-CM | POA: Diagnosis not present

## 2021-07-31 DIAGNOSIS — I639 Cerebral infarction, unspecified: Secondary | ICD-10-CM | POA: Diagnosis not present

## 2021-07-31 DIAGNOSIS — E118 Type 2 diabetes mellitus with unspecified complications: Secondary | ICD-10-CM | POA: Diagnosis not present

## 2021-07-31 DIAGNOSIS — I2510 Atherosclerotic heart disease of native coronary artery without angina pectoris: Secondary | ICD-10-CM | POA: Diagnosis not present

## 2021-07-31 DIAGNOSIS — N184 Chronic kidney disease, stage 4 (severe): Secondary | ICD-10-CM | POA: Diagnosis not present

## 2021-07-31 DIAGNOSIS — I509 Heart failure, unspecified: Secondary | ICD-10-CM | POA: Diagnosis not present

## 2021-07-31 DIAGNOSIS — Z95810 Presence of automatic (implantable) cardiac defibrillator: Secondary | ICD-10-CM | POA: Diagnosis not present

## 2021-08-04 DIAGNOSIS — I639 Cerebral infarction, unspecified: Secondary | ICD-10-CM | POA: Diagnosis not present

## 2021-08-04 DIAGNOSIS — N184 Chronic kidney disease, stage 4 (severe): Secondary | ICD-10-CM | POA: Diagnosis not present

## 2021-08-04 DIAGNOSIS — Z95810 Presence of automatic (implantable) cardiac defibrillator: Secondary | ICD-10-CM | POA: Diagnosis not present

## 2021-08-04 DIAGNOSIS — E118 Type 2 diabetes mellitus with unspecified complications: Secondary | ICD-10-CM | POA: Diagnosis not present

## 2021-08-04 DIAGNOSIS — I509 Heart failure, unspecified: Secondary | ICD-10-CM | POA: Diagnosis not present

## 2021-08-04 DIAGNOSIS — I2510 Atherosclerotic heart disease of native coronary artery without angina pectoris: Secondary | ICD-10-CM | POA: Diagnosis not present

## 2021-08-05 DIAGNOSIS — I639 Cerebral infarction, unspecified: Secondary | ICD-10-CM | POA: Diagnosis not present

## 2021-08-05 DIAGNOSIS — I2510 Atherosclerotic heart disease of native coronary artery without angina pectoris: Secondary | ICD-10-CM | POA: Diagnosis not present

## 2021-08-05 DIAGNOSIS — E118 Type 2 diabetes mellitus with unspecified complications: Secondary | ICD-10-CM | POA: Diagnosis not present

## 2021-08-05 DIAGNOSIS — N184 Chronic kidney disease, stage 4 (severe): Secondary | ICD-10-CM | POA: Diagnosis not present

## 2021-08-05 DIAGNOSIS — I509 Heart failure, unspecified: Secondary | ICD-10-CM | POA: Diagnosis not present

## 2021-08-05 DIAGNOSIS — Z95810 Presence of automatic (implantable) cardiac defibrillator: Secondary | ICD-10-CM | POA: Diagnosis not present

## 2021-08-06 DIAGNOSIS — Z95810 Presence of automatic (implantable) cardiac defibrillator: Secondary | ICD-10-CM | POA: Diagnosis not present

## 2021-08-06 DIAGNOSIS — E118 Type 2 diabetes mellitus with unspecified complications: Secondary | ICD-10-CM | POA: Diagnosis not present

## 2021-08-06 DIAGNOSIS — I509 Heart failure, unspecified: Secondary | ICD-10-CM | POA: Diagnosis not present

## 2021-08-06 DIAGNOSIS — N184 Chronic kidney disease, stage 4 (severe): Secondary | ICD-10-CM | POA: Diagnosis not present

## 2021-08-06 DIAGNOSIS — I2510 Atherosclerotic heart disease of native coronary artery without angina pectoris: Secondary | ICD-10-CM | POA: Diagnosis not present

## 2021-08-06 DIAGNOSIS — I639 Cerebral infarction, unspecified: Secondary | ICD-10-CM | POA: Diagnosis not present

## 2021-08-11 NOTE — Progress Notes (Signed)
Remote ICD transmission.   

## 2021-08-12 DIAGNOSIS — Z95810 Presence of automatic (implantable) cardiac defibrillator: Secondary | ICD-10-CM | POA: Diagnosis not present

## 2021-08-12 DIAGNOSIS — I639 Cerebral infarction, unspecified: Secondary | ICD-10-CM | POA: Diagnosis not present

## 2021-08-12 DIAGNOSIS — I2510 Atherosclerotic heart disease of native coronary artery without angina pectoris: Secondary | ICD-10-CM | POA: Diagnosis not present

## 2021-08-12 DIAGNOSIS — I509 Heart failure, unspecified: Secondary | ICD-10-CM | POA: Diagnosis not present

## 2021-08-12 DIAGNOSIS — N184 Chronic kidney disease, stage 4 (severe): Secondary | ICD-10-CM | POA: Diagnosis not present

## 2021-08-12 DIAGNOSIS — E118 Type 2 diabetes mellitus with unspecified complications: Secondary | ICD-10-CM | POA: Diagnosis not present

## 2021-08-13 DIAGNOSIS — E118 Type 2 diabetes mellitus with unspecified complications: Secondary | ICD-10-CM | POA: Diagnosis not present

## 2021-08-13 DIAGNOSIS — N184 Chronic kidney disease, stage 4 (severe): Secondary | ICD-10-CM | POA: Diagnosis not present

## 2021-08-13 DIAGNOSIS — I2510 Atherosclerotic heart disease of native coronary artery without angina pectoris: Secondary | ICD-10-CM | POA: Diagnosis not present

## 2021-08-13 DIAGNOSIS — Z95810 Presence of automatic (implantable) cardiac defibrillator: Secondary | ICD-10-CM | POA: Diagnosis not present

## 2021-08-13 DIAGNOSIS — I639 Cerebral infarction, unspecified: Secondary | ICD-10-CM | POA: Diagnosis not present

## 2021-08-13 DIAGNOSIS — I509 Heart failure, unspecified: Secondary | ICD-10-CM | POA: Diagnosis not present

## 2021-08-19 DIAGNOSIS — N184 Chronic kidney disease, stage 4 (severe): Secondary | ICD-10-CM | POA: Diagnosis not present

## 2021-08-19 DIAGNOSIS — Z95810 Presence of automatic (implantable) cardiac defibrillator: Secondary | ICD-10-CM | POA: Diagnosis not present

## 2021-08-19 DIAGNOSIS — I509 Heart failure, unspecified: Secondary | ICD-10-CM | POA: Diagnosis not present

## 2021-08-19 DIAGNOSIS — I639 Cerebral infarction, unspecified: Secondary | ICD-10-CM | POA: Diagnosis not present

## 2021-08-19 DIAGNOSIS — I2510 Atherosclerotic heart disease of native coronary artery without angina pectoris: Secondary | ICD-10-CM | POA: Diagnosis not present

## 2021-08-19 DIAGNOSIS — E118 Type 2 diabetes mellitus with unspecified complications: Secondary | ICD-10-CM | POA: Diagnosis not present

## 2021-08-20 DIAGNOSIS — N184 Chronic kidney disease, stage 4 (severe): Secondary | ICD-10-CM | POA: Diagnosis not present

## 2021-08-20 DIAGNOSIS — E118 Type 2 diabetes mellitus with unspecified complications: Secondary | ICD-10-CM | POA: Diagnosis not present

## 2021-08-20 DIAGNOSIS — I639 Cerebral infarction, unspecified: Secondary | ICD-10-CM | POA: Diagnosis not present

## 2021-08-20 DIAGNOSIS — I509 Heart failure, unspecified: Secondary | ICD-10-CM | POA: Diagnosis not present

## 2021-08-20 DIAGNOSIS — I255 Ischemic cardiomyopathy: Secondary | ICD-10-CM | POA: Diagnosis not present

## 2021-08-20 DIAGNOSIS — I214 Non-ST elevation (NSTEMI) myocardial infarction: Secondary | ICD-10-CM | POA: Diagnosis not present

## 2021-08-20 DIAGNOSIS — I2510 Atherosclerotic heart disease of native coronary artery without angina pectoris: Secondary | ICD-10-CM | POA: Diagnosis not present

## 2021-08-20 DIAGNOSIS — R531 Weakness: Secondary | ICD-10-CM | POA: Diagnosis not present

## 2021-08-20 DIAGNOSIS — R0602 Shortness of breath: Secondary | ICD-10-CM | POA: Diagnosis not present

## 2021-08-20 DIAGNOSIS — E785 Hyperlipidemia, unspecified: Secondary | ICD-10-CM | POA: Diagnosis not present

## 2021-08-20 DIAGNOSIS — R609 Edema, unspecified: Secondary | ICD-10-CM | POA: Diagnosis not present

## 2021-08-20 DIAGNOSIS — I10 Essential (primary) hypertension: Secondary | ICD-10-CM | POA: Diagnosis not present

## 2021-08-20 DIAGNOSIS — M109 Gout, unspecified: Secondary | ICD-10-CM | POA: Diagnosis not present

## 2021-08-25 DIAGNOSIS — R532 Functional quadriplegia: Secondary | ICD-10-CM | POA: Diagnosis not present

## 2021-08-25 DIAGNOSIS — N184 Chronic kidney disease, stage 4 (severe): Secondary | ICD-10-CM | POA: Diagnosis not present

## 2021-08-25 DIAGNOSIS — I2510 Atherosclerotic heart disease of native coronary artery without angina pectoris: Secondary | ICD-10-CM | POA: Diagnosis not present

## 2021-08-25 DIAGNOSIS — Z299 Encounter for prophylactic measures, unspecified: Secondary | ICD-10-CM | POA: Diagnosis not present

## 2021-08-25 DIAGNOSIS — H353221 Exudative age-related macular degeneration, left eye, with active choroidal neovascularization: Secondary | ICD-10-CM | POA: Diagnosis not present

## 2021-08-25 DIAGNOSIS — E213 Hyperparathyroidism, unspecified: Secondary | ICD-10-CM | POA: Diagnosis not present

## 2021-08-25 DIAGNOSIS — I10 Essential (primary) hypertension: Secondary | ICD-10-CM | POA: Diagnosis not present

## 2021-08-25 DIAGNOSIS — I639 Cerebral infarction, unspecified: Secondary | ICD-10-CM | POA: Diagnosis not present

## 2021-08-25 DIAGNOSIS — I509 Heart failure, unspecified: Secondary | ICD-10-CM | POA: Diagnosis not present

## 2021-08-25 DIAGNOSIS — E118 Type 2 diabetes mellitus with unspecified complications: Secondary | ICD-10-CM | POA: Diagnosis not present

## 2021-08-26 DIAGNOSIS — N184 Chronic kidney disease, stage 4 (severe): Secondary | ICD-10-CM | POA: Diagnosis not present

## 2021-08-26 DIAGNOSIS — I2510 Atherosclerotic heart disease of native coronary artery without angina pectoris: Secondary | ICD-10-CM | POA: Diagnosis not present

## 2021-08-26 DIAGNOSIS — I10 Essential (primary) hypertension: Secondary | ICD-10-CM | POA: Diagnosis not present

## 2021-08-26 DIAGNOSIS — I509 Heart failure, unspecified: Secondary | ICD-10-CM | POA: Diagnosis not present

## 2021-08-26 DIAGNOSIS — I639 Cerebral infarction, unspecified: Secondary | ICD-10-CM | POA: Diagnosis not present

## 2021-08-26 DIAGNOSIS — E118 Type 2 diabetes mellitus with unspecified complications: Secondary | ICD-10-CM | POA: Diagnosis not present

## 2021-08-31 DIAGNOSIS — E118 Type 2 diabetes mellitus with unspecified complications: Secondary | ICD-10-CM | POA: Diagnosis not present

## 2021-08-31 DIAGNOSIS — I509 Heart failure, unspecified: Secondary | ICD-10-CM | POA: Diagnosis not present

## 2021-08-31 DIAGNOSIS — I10 Essential (primary) hypertension: Secondary | ICD-10-CM | POA: Diagnosis not present

## 2021-08-31 DIAGNOSIS — I2510 Atherosclerotic heart disease of native coronary artery without angina pectoris: Secondary | ICD-10-CM | POA: Diagnosis not present

## 2021-08-31 DIAGNOSIS — I639 Cerebral infarction, unspecified: Secondary | ICD-10-CM | POA: Diagnosis not present

## 2021-08-31 DIAGNOSIS — N184 Chronic kidney disease, stage 4 (severe): Secondary | ICD-10-CM | POA: Diagnosis not present

## 2021-09-02 DIAGNOSIS — I10 Essential (primary) hypertension: Secondary | ICD-10-CM | POA: Diagnosis not present

## 2021-09-02 DIAGNOSIS — E118 Type 2 diabetes mellitus with unspecified complications: Secondary | ICD-10-CM | POA: Diagnosis not present

## 2021-09-02 DIAGNOSIS — I509 Heart failure, unspecified: Secondary | ICD-10-CM | POA: Diagnosis not present

## 2021-09-02 DIAGNOSIS — I639 Cerebral infarction, unspecified: Secondary | ICD-10-CM | POA: Diagnosis not present

## 2021-09-02 DIAGNOSIS — I2510 Atherosclerotic heart disease of native coronary artery without angina pectoris: Secondary | ICD-10-CM | POA: Diagnosis not present

## 2021-09-02 DIAGNOSIS — N184 Chronic kidney disease, stage 4 (severe): Secondary | ICD-10-CM | POA: Diagnosis not present

## 2021-09-03 DIAGNOSIS — E118 Type 2 diabetes mellitus with unspecified complications: Secondary | ICD-10-CM | POA: Diagnosis not present

## 2021-09-03 DIAGNOSIS — N184 Chronic kidney disease, stage 4 (severe): Secondary | ICD-10-CM | POA: Diagnosis not present

## 2021-09-03 DIAGNOSIS — I509 Heart failure, unspecified: Secondary | ICD-10-CM | POA: Diagnosis not present

## 2021-09-03 DIAGNOSIS — I639 Cerebral infarction, unspecified: Secondary | ICD-10-CM | POA: Diagnosis not present

## 2021-09-03 DIAGNOSIS — I10 Essential (primary) hypertension: Secondary | ICD-10-CM | POA: Diagnosis not present

## 2021-09-03 DIAGNOSIS — I2510 Atherosclerotic heart disease of native coronary artery without angina pectoris: Secondary | ICD-10-CM | POA: Diagnosis not present

## 2021-09-09 DIAGNOSIS — E118 Type 2 diabetes mellitus with unspecified complications: Secondary | ICD-10-CM | POA: Diagnosis not present

## 2021-09-09 DIAGNOSIS — I509 Heart failure, unspecified: Secondary | ICD-10-CM | POA: Diagnosis not present

## 2021-09-09 DIAGNOSIS — I639 Cerebral infarction, unspecified: Secondary | ICD-10-CM | POA: Diagnosis not present

## 2021-09-09 DIAGNOSIS — N184 Chronic kidney disease, stage 4 (severe): Secondary | ICD-10-CM | POA: Diagnosis not present

## 2021-09-09 DIAGNOSIS — I10 Essential (primary) hypertension: Secondary | ICD-10-CM | POA: Diagnosis not present

## 2021-09-09 DIAGNOSIS — I2510 Atherosclerotic heart disease of native coronary artery without angina pectoris: Secondary | ICD-10-CM | POA: Diagnosis not present

## 2021-09-11 DIAGNOSIS — E118 Type 2 diabetes mellitus with unspecified complications: Secondary | ICD-10-CM | POA: Diagnosis not present

## 2021-09-11 DIAGNOSIS — N184 Chronic kidney disease, stage 4 (severe): Secondary | ICD-10-CM | POA: Diagnosis not present

## 2021-09-11 DIAGNOSIS — I2510 Atherosclerotic heart disease of native coronary artery without angina pectoris: Secondary | ICD-10-CM | POA: Diagnosis not present

## 2021-09-11 DIAGNOSIS — I509 Heart failure, unspecified: Secondary | ICD-10-CM | POA: Diagnosis not present

## 2021-09-11 DIAGNOSIS — I639 Cerebral infarction, unspecified: Secondary | ICD-10-CM | POA: Diagnosis not present

## 2021-09-11 DIAGNOSIS — I10 Essential (primary) hypertension: Secondary | ICD-10-CM | POA: Diagnosis not present

## 2021-09-14 DIAGNOSIS — E118 Type 2 diabetes mellitus with unspecified complications: Secondary | ICD-10-CM | POA: Diagnosis not present

## 2021-09-14 DIAGNOSIS — I509 Heart failure, unspecified: Secondary | ICD-10-CM | POA: Diagnosis not present

## 2021-09-14 DIAGNOSIS — I2510 Atherosclerotic heart disease of native coronary artery without angina pectoris: Secondary | ICD-10-CM | POA: Diagnosis not present

## 2021-09-14 DIAGNOSIS — I10 Essential (primary) hypertension: Secondary | ICD-10-CM | POA: Diagnosis not present

## 2021-09-14 DIAGNOSIS — N184 Chronic kidney disease, stage 4 (severe): Secondary | ICD-10-CM | POA: Diagnosis not present

## 2021-09-14 DIAGNOSIS — I639 Cerebral infarction, unspecified: Secondary | ICD-10-CM | POA: Diagnosis not present

## 2021-09-16 DIAGNOSIS — I10 Essential (primary) hypertension: Secondary | ICD-10-CM | POA: Diagnosis not present

## 2021-09-16 DIAGNOSIS — I639 Cerebral infarction, unspecified: Secondary | ICD-10-CM | POA: Diagnosis not present

## 2021-09-16 DIAGNOSIS — N184 Chronic kidney disease, stage 4 (severe): Secondary | ICD-10-CM | POA: Diagnosis not present

## 2021-09-16 DIAGNOSIS — I2510 Atherosclerotic heart disease of native coronary artery without angina pectoris: Secondary | ICD-10-CM | POA: Diagnosis not present

## 2021-09-16 DIAGNOSIS — E118 Type 2 diabetes mellitus with unspecified complications: Secondary | ICD-10-CM | POA: Diagnosis not present

## 2021-09-16 DIAGNOSIS — I509 Heart failure, unspecified: Secondary | ICD-10-CM | POA: Diagnosis not present

## 2021-09-17 DIAGNOSIS — I2510 Atherosclerotic heart disease of native coronary artery without angina pectoris: Secondary | ICD-10-CM | POA: Diagnosis not present

## 2021-09-17 DIAGNOSIS — M1990 Unspecified osteoarthritis, unspecified site: Secondary | ICD-10-CM | POA: Diagnosis not present

## 2021-09-17 DIAGNOSIS — E1165 Type 2 diabetes mellitus with hyperglycemia: Secondary | ICD-10-CM | POA: Diagnosis not present

## 2021-09-17 DIAGNOSIS — Z299 Encounter for prophylactic measures, unspecified: Secondary | ICD-10-CM | POA: Diagnosis not present

## 2021-09-17 DIAGNOSIS — I639 Cerebral infarction, unspecified: Secondary | ICD-10-CM | POA: Diagnosis not present

## 2021-09-17 DIAGNOSIS — N184 Chronic kidney disease, stage 4 (severe): Secondary | ICD-10-CM | POA: Diagnosis not present

## 2021-09-17 DIAGNOSIS — I10 Essential (primary) hypertension: Secondary | ICD-10-CM | POA: Diagnosis not present

## 2021-09-17 DIAGNOSIS — E118 Type 2 diabetes mellitus with unspecified complications: Secondary | ICD-10-CM | POA: Diagnosis not present

## 2021-09-17 DIAGNOSIS — I509 Heart failure, unspecified: Secondary | ICD-10-CM | POA: Diagnosis not present

## 2021-09-17 DIAGNOSIS — E162 Hypoglycemia, unspecified: Secondary | ICD-10-CM | POA: Diagnosis not present

## 2021-09-19 DIAGNOSIS — N184 Chronic kidney disease, stage 4 (severe): Secondary | ICD-10-CM | POA: Diagnosis not present

## 2021-09-19 DIAGNOSIS — I10 Essential (primary) hypertension: Secondary | ICD-10-CM | POA: Diagnosis not present

## 2021-09-19 DIAGNOSIS — R609 Edema, unspecified: Secondary | ICD-10-CM | POA: Diagnosis not present

## 2021-09-19 DIAGNOSIS — I639 Cerebral infarction, unspecified: Secondary | ICD-10-CM | POA: Diagnosis not present

## 2021-09-19 DIAGNOSIS — R0602 Shortness of breath: Secondary | ICD-10-CM | POA: Diagnosis not present

## 2021-09-19 DIAGNOSIS — I2510 Atherosclerotic heart disease of native coronary artery without angina pectoris: Secondary | ICD-10-CM | POA: Diagnosis not present

## 2021-09-19 DIAGNOSIS — I255 Ischemic cardiomyopathy: Secondary | ICD-10-CM | POA: Diagnosis not present

## 2021-09-19 DIAGNOSIS — E785 Hyperlipidemia, unspecified: Secondary | ICD-10-CM | POA: Diagnosis not present

## 2021-09-19 DIAGNOSIS — M109 Gout, unspecified: Secondary | ICD-10-CM | POA: Diagnosis not present

## 2021-09-19 DIAGNOSIS — I509 Heart failure, unspecified: Secondary | ICD-10-CM | POA: Diagnosis not present

## 2021-09-19 DIAGNOSIS — R531 Weakness: Secondary | ICD-10-CM | POA: Diagnosis not present

## 2021-09-19 DIAGNOSIS — I214 Non-ST elevation (NSTEMI) myocardial infarction: Secondary | ICD-10-CM | POA: Diagnosis not present

## 2021-09-19 DIAGNOSIS — E118 Type 2 diabetes mellitus with unspecified complications: Secondary | ICD-10-CM | POA: Diagnosis not present

## 2021-09-21 DIAGNOSIS — I2510 Atherosclerotic heart disease of native coronary artery without angina pectoris: Secondary | ICD-10-CM | POA: Diagnosis not present

## 2021-09-21 DIAGNOSIS — E118 Type 2 diabetes mellitus with unspecified complications: Secondary | ICD-10-CM | POA: Diagnosis not present

## 2021-09-21 DIAGNOSIS — I639 Cerebral infarction, unspecified: Secondary | ICD-10-CM | POA: Diagnosis not present

## 2021-09-21 DIAGNOSIS — N184 Chronic kidney disease, stage 4 (severe): Secondary | ICD-10-CM | POA: Diagnosis not present

## 2021-09-21 DIAGNOSIS — I10 Essential (primary) hypertension: Secondary | ICD-10-CM | POA: Diagnosis not present

## 2021-09-21 DIAGNOSIS — I509 Heart failure, unspecified: Secondary | ICD-10-CM | POA: Diagnosis not present

## 2021-09-23 DIAGNOSIS — N184 Chronic kidney disease, stage 4 (severe): Secondary | ICD-10-CM | POA: Diagnosis not present

## 2021-09-23 DIAGNOSIS — I639 Cerebral infarction, unspecified: Secondary | ICD-10-CM | POA: Diagnosis not present

## 2021-09-23 DIAGNOSIS — E118 Type 2 diabetes mellitus with unspecified complications: Secondary | ICD-10-CM | POA: Diagnosis not present

## 2021-09-23 DIAGNOSIS — I10 Essential (primary) hypertension: Secondary | ICD-10-CM | POA: Diagnosis not present

## 2021-09-23 DIAGNOSIS — I509 Heart failure, unspecified: Secondary | ICD-10-CM | POA: Diagnosis not present

## 2021-09-23 DIAGNOSIS — I2510 Atherosclerotic heart disease of native coronary artery without angina pectoris: Secondary | ICD-10-CM | POA: Diagnosis not present

## 2021-09-25 DIAGNOSIS — N184 Chronic kidney disease, stage 4 (severe): Secondary | ICD-10-CM | POA: Diagnosis not present

## 2021-09-25 DIAGNOSIS — I10 Essential (primary) hypertension: Secondary | ICD-10-CM | POA: Diagnosis not present

## 2021-09-25 DIAGNOSIS — I639 Cerebral infarction, unspecified: Secondary | ICD-10-CM | POA: Diagnosis not present

## 2021-09-25 DIAGNOSIS — I2510 Atherosclerotic heart disease of native coronary artery without angina pectoris: Secondary | ICD-10-CM | POA: Diagnosis not present

## 2021-09-25 DIAGNOSIS — I509 Heart failure, unspecified: Secondary | ICD-10-CM | POA: Diagnosis not present

## 2021-09-25 DIAGNOSIS — E118 Type 2 diabetes mellitus with unspecified complications: Secondary | ICD-10-CM | POA: Diagnosis not present

## 2021-09-30 DIAGNOSIS — I2510 Atherosclerotic heart disease of native coronary artery without angina pectoris: Secondary | ICD-10-CM | POA: Diagnosis not present

## 2021-09-30 DIAGNOSIS — N184 Chronic kidney disease, stage 4 (severe): Secondary | ICD-10-CM | POA: Diagnosis not present

## 2021-09-30 DIAGNOSIS — E118 Type 2 diabetes mellitus with unspecified complications: Secondary | ICD-10-CM | POA: Diagnosis not present

## 2021-09-30 DIAGNOSIS — I509 Heart failure, unspecified: Secondary | ICD-10-CM | POA: Diagnosis not present

## 2021-09-30 DIAGNOSIS — I10 Essential (primary) hypertension: Secondary | ICD-10-CM | POA: Diagnosis not present

## 2021-09-30 DIAGNOSIS — I639 Cerebral infarction, unspecified: Secondary | ICD-10-CM | POA: Diagnosis not present

## 2021-10-01 DIAGNOSIS — I10 Essential (primary) hypertension: Secondary | ICD-10-CM | POA: Diagnosis not present

## 2021-10-01 DIAGNOSIS — I509 Heart failure, unspecified: Secondary | ICD-10-CM | POA: Diagnosis not present

## 2021-10-01 DIAGNOSIS — I639 Cerebral infarction, unspecified: Secondary | ICD-10-CM | POA: Diagnosis not present

## 2021-10-01 DIAGNOSIS — E118 Type 2 diabetes mellitus with unspecified complications: Secondary | ICD-10-CM | POA: Diagnosis not present

## 2021-10-01 DIAGNOSIS — I2510 Atherosclerotic heart disease of native coronary artery without angina pectoris: Secondary | ICD-10-CM | POA: Diagnosis not present

## 2021-10-01 DIAGNOSIS — N184 Chronic kidney disease, stage 4 (severe): Secondary | ICD-10-CM | POA: Diagnosis not present

## 2021-10-02 DIAGNOSIS — E118 Type 2 diabetes mellitus with unspecified complications: Secondary | ICD-10-CM | POA: Diagnosis not present

## 2021-10-02 DIAGNOSIS — N184 Chronic kidney disease, stage 4 (severe): Secondary | ICD-10-CM | POA: Diagnosis not present

## 2021-10-02 DIAGNOSIS — I509 Heart failure, unspecified: Secondary | ICD-10-CM | POA: Diagnosis not present

## 2021-10-02 DIAGNOSIS — I639 Cerebral infarction, unspecified: Secondary | ICD-10-CM | POA: Diagnosis not present

## 2021-10-02 DIAGNOSIS — I10 Essential (primary) hypertension: Secondary | ICD-10-CM | POA: Diagnosis not present

## 2021-10-02 DIAGNOSIS — I2510 Atherosclerotic heart disease of native coronary artery without angina pectoris: Secondary | ICD-10-CM | POA: Diagnosis not present

## 2021-10-03 DIAGNOSIS — I509 Heart failure, unspecified: Secondary | ICD-10-CM | POA: Diagnosis not present

## 2021-10-03 DIAGNOSIS — I2510 Atherosclerotic heart disease of native coronary artery without angina pectoris: Secondary | ICD-10-CM | POA: Diagnosis not present

## 2021-10-03 DIAGNOSIS — E118 Type 2 diabetes mellitus with unspecified complications: Secondary | ICD-10-CM | POA: Diagnosis not present

## 2021-10-03 DIAGNOSIS — N184 Chronic kidney disease, stage 4 (severe): Secondary | ICD-10-CM | POA: Diagnosis not present

## 2021-10-03 DIAGNOSIS — I639 Cerebral infarction, unspecified: Secondary | ICD-10-CM | POA: Diagnosis not present

## 2021-10-03 DIAGNOSIS — I10 Essential (primary) hypertension: Secondary | ICD-10-CM | POA: Diagnosis not present

## 2021-10-20 DEATH — deceased

## 2023-05-18 IMAGING — DX DG HIP (WITH OR WITHOUT PELVIS) 2V BILAT
6 series · 6 of 6 positions shown · non-contrast
Comparison: None.

CLINICAL DATA: Fall.  Left hip pain.

EXAM:
DG HIP (WITH OR WITHOUT PELVIS) 2V BILAT

[pelvis ap (1 of 2)]
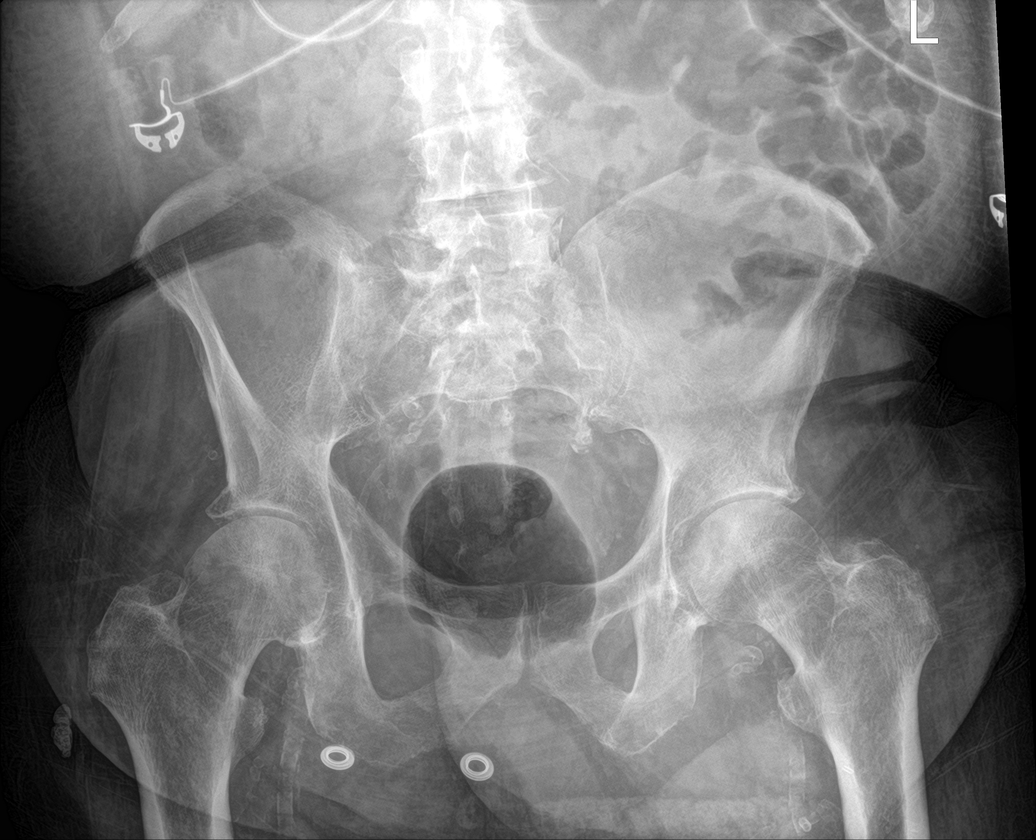

[hip ap (1 of 2)]
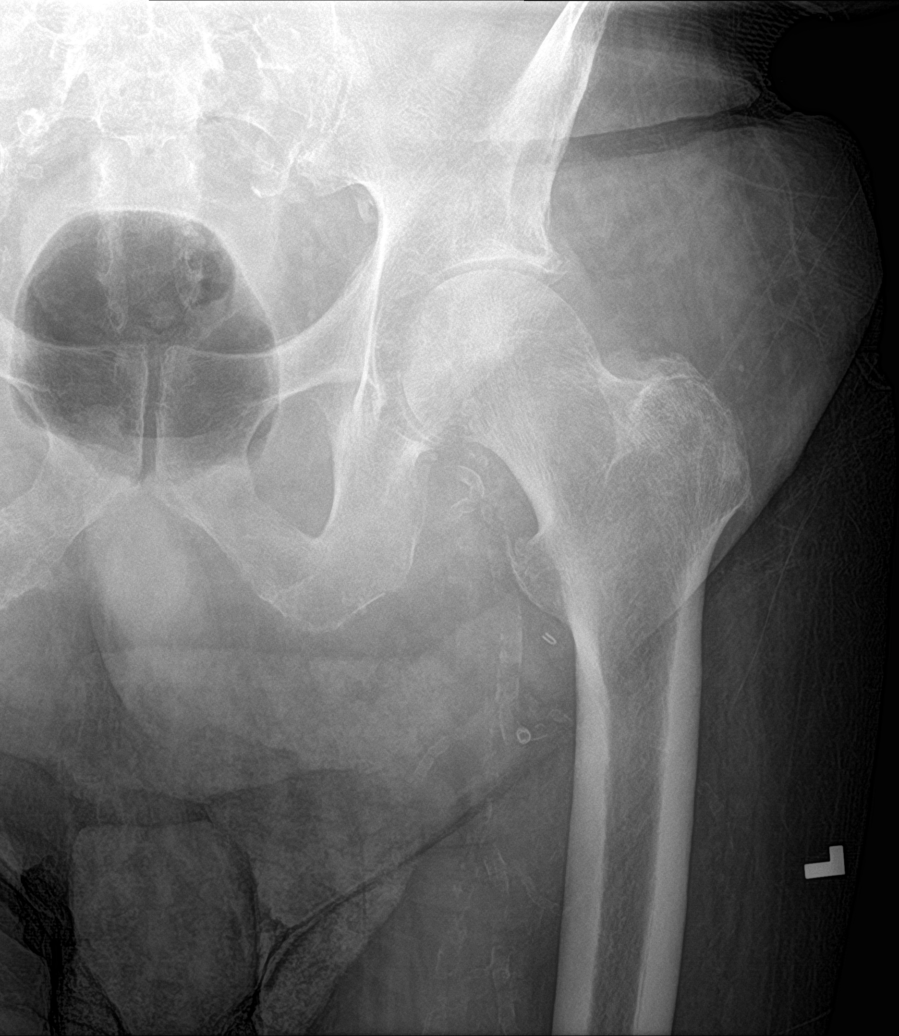

[hip lat (1 of 2)]
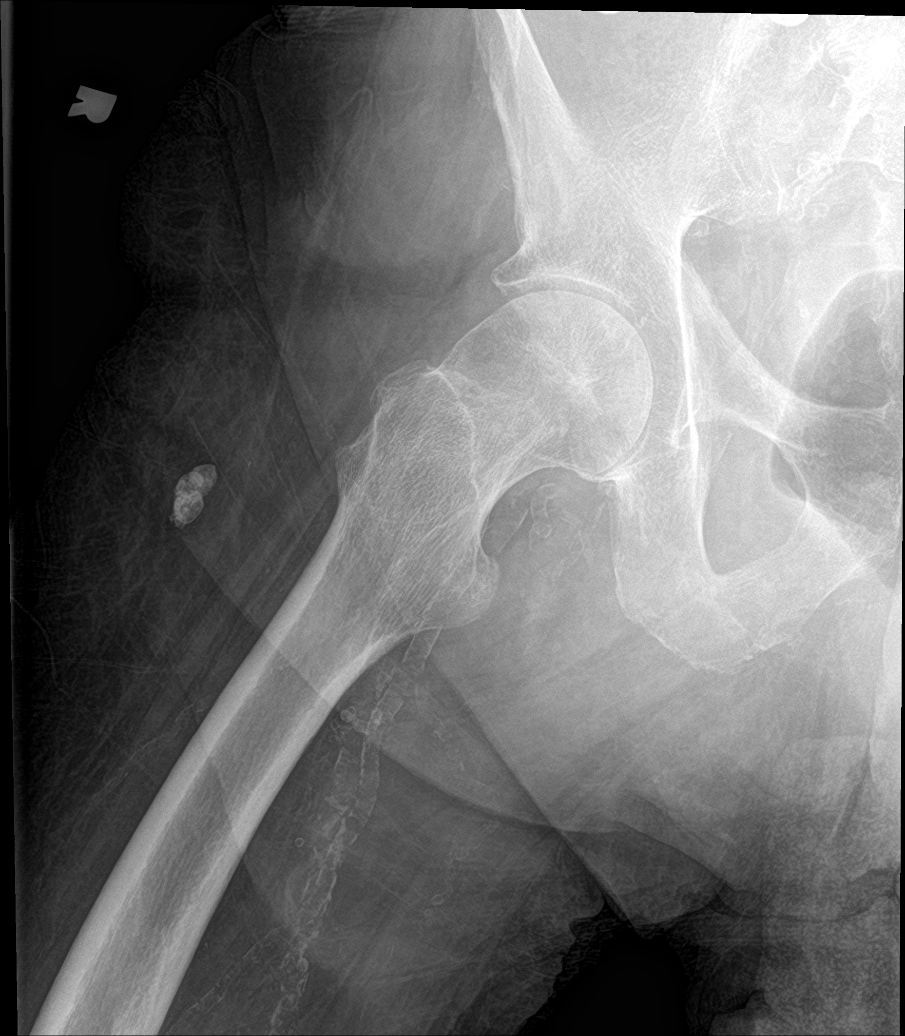

[hip ap (2 of 2)]
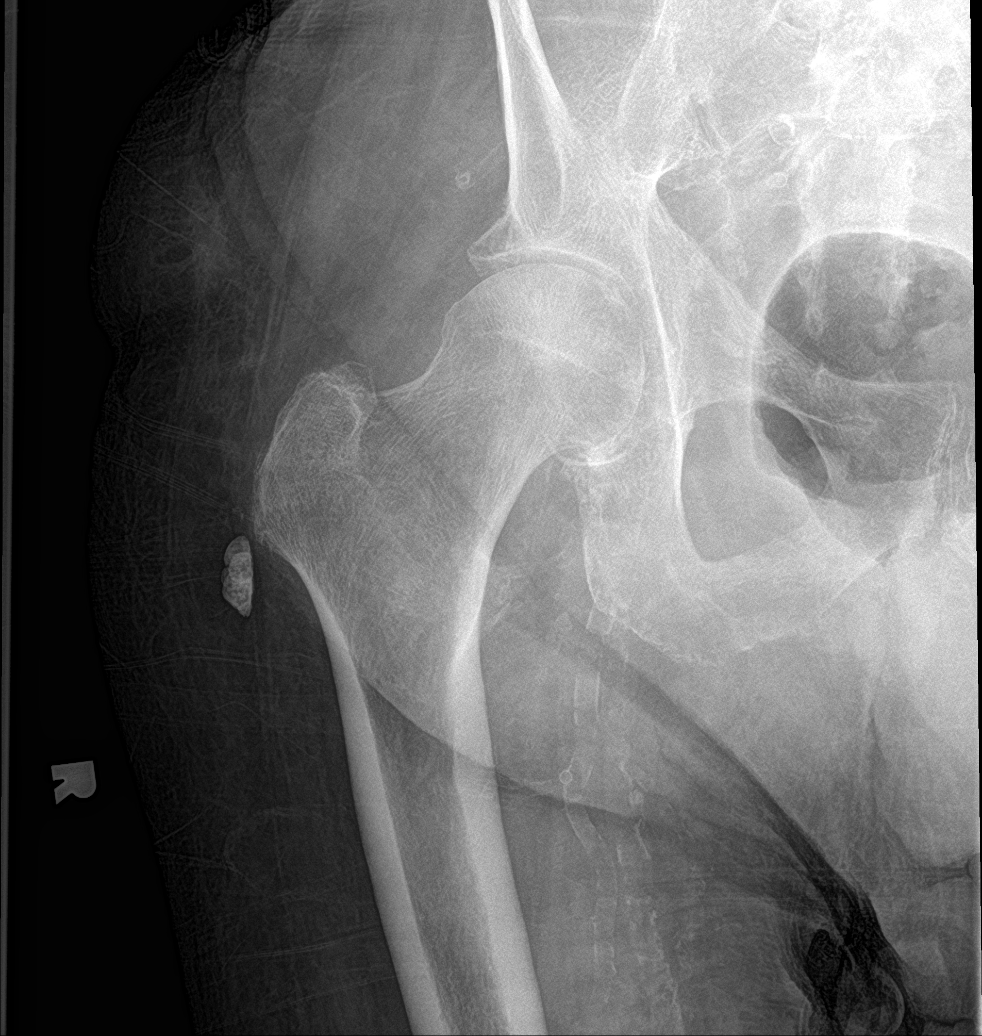

[hip lat (2 of 2)]
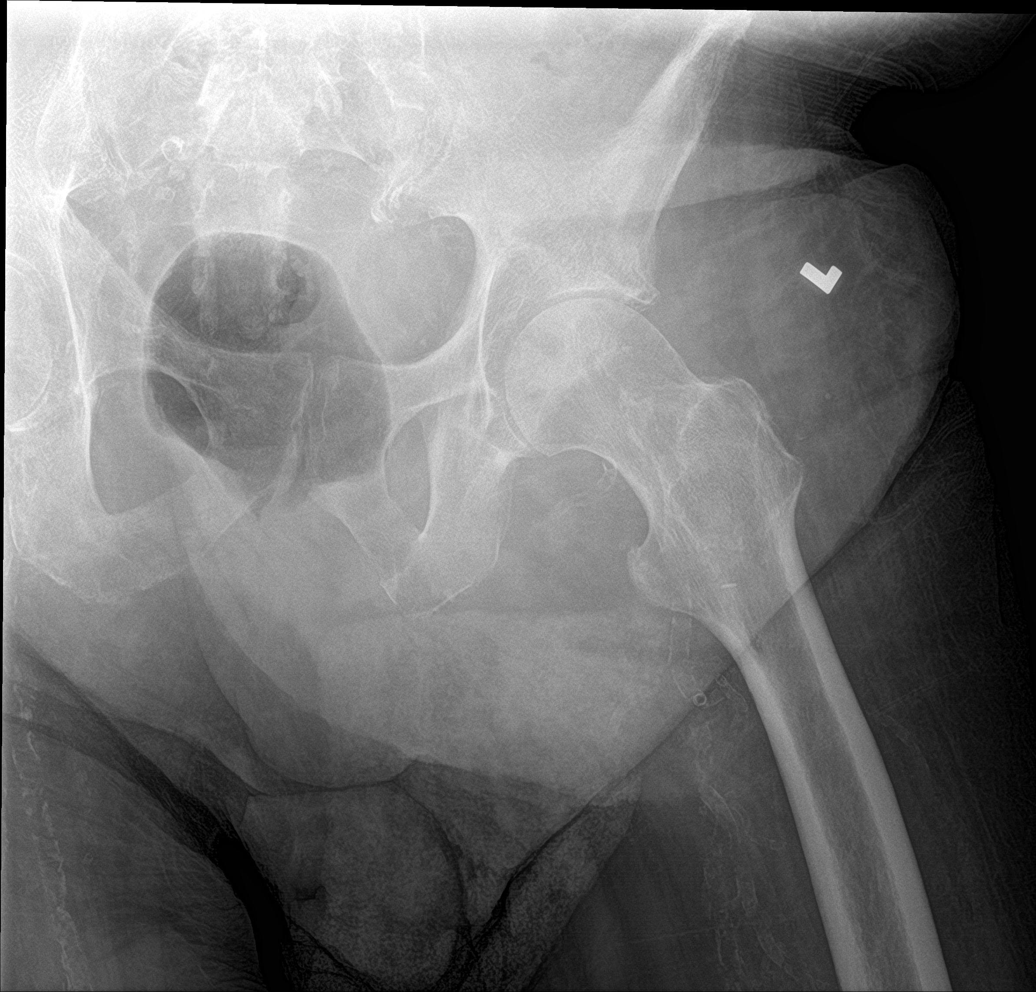

[pelvis ap (2 of 2)]
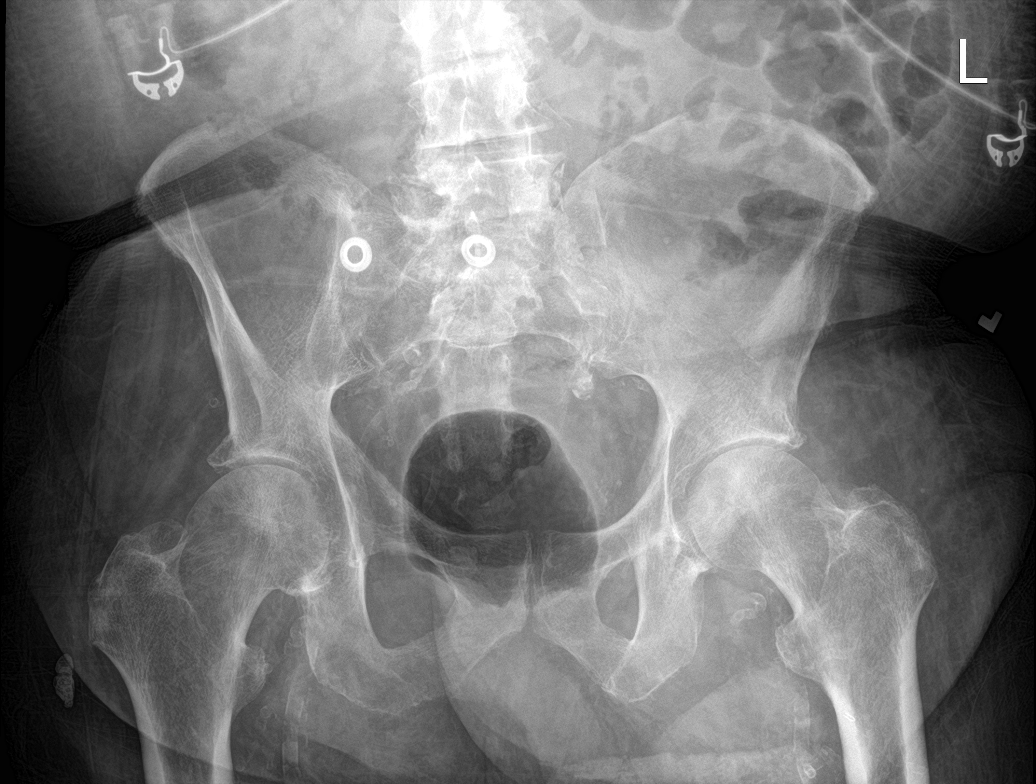

[6 of 6 positions shown; findings below may reference images not displayed]

FINDINGS: The cortical margins of the bony pelvis are intact. No fracture.
Pubic symphysis and sacroiliac joints are congruent. Both femoral
heads are well-seated in the respective acetabula. Moderate
bilateral hip osteoarthritis. Bones are diffusely under mineralized.
Lobulated soft tissue calcifications lateral to the right hip
greater trochanter, may be granulomas or sequela of prior injury.
Prominent vascular calcifications.
IMPRESSION: 1. No fracture of the pelvis or hips.
2. Moderate bilateral hip osteoarthritis.

## 2023-05-18 IMAGING — DX DG SHOULDER 2+V*R*
2 series · 2 of 2 positions shown · non-contrast
Comparison: None.

CLINICAL DATA: Post fall with right shoulder pain.

EXAM:
RIGHT SHOULDER - 2+ VIEW

[shoulder grashey]
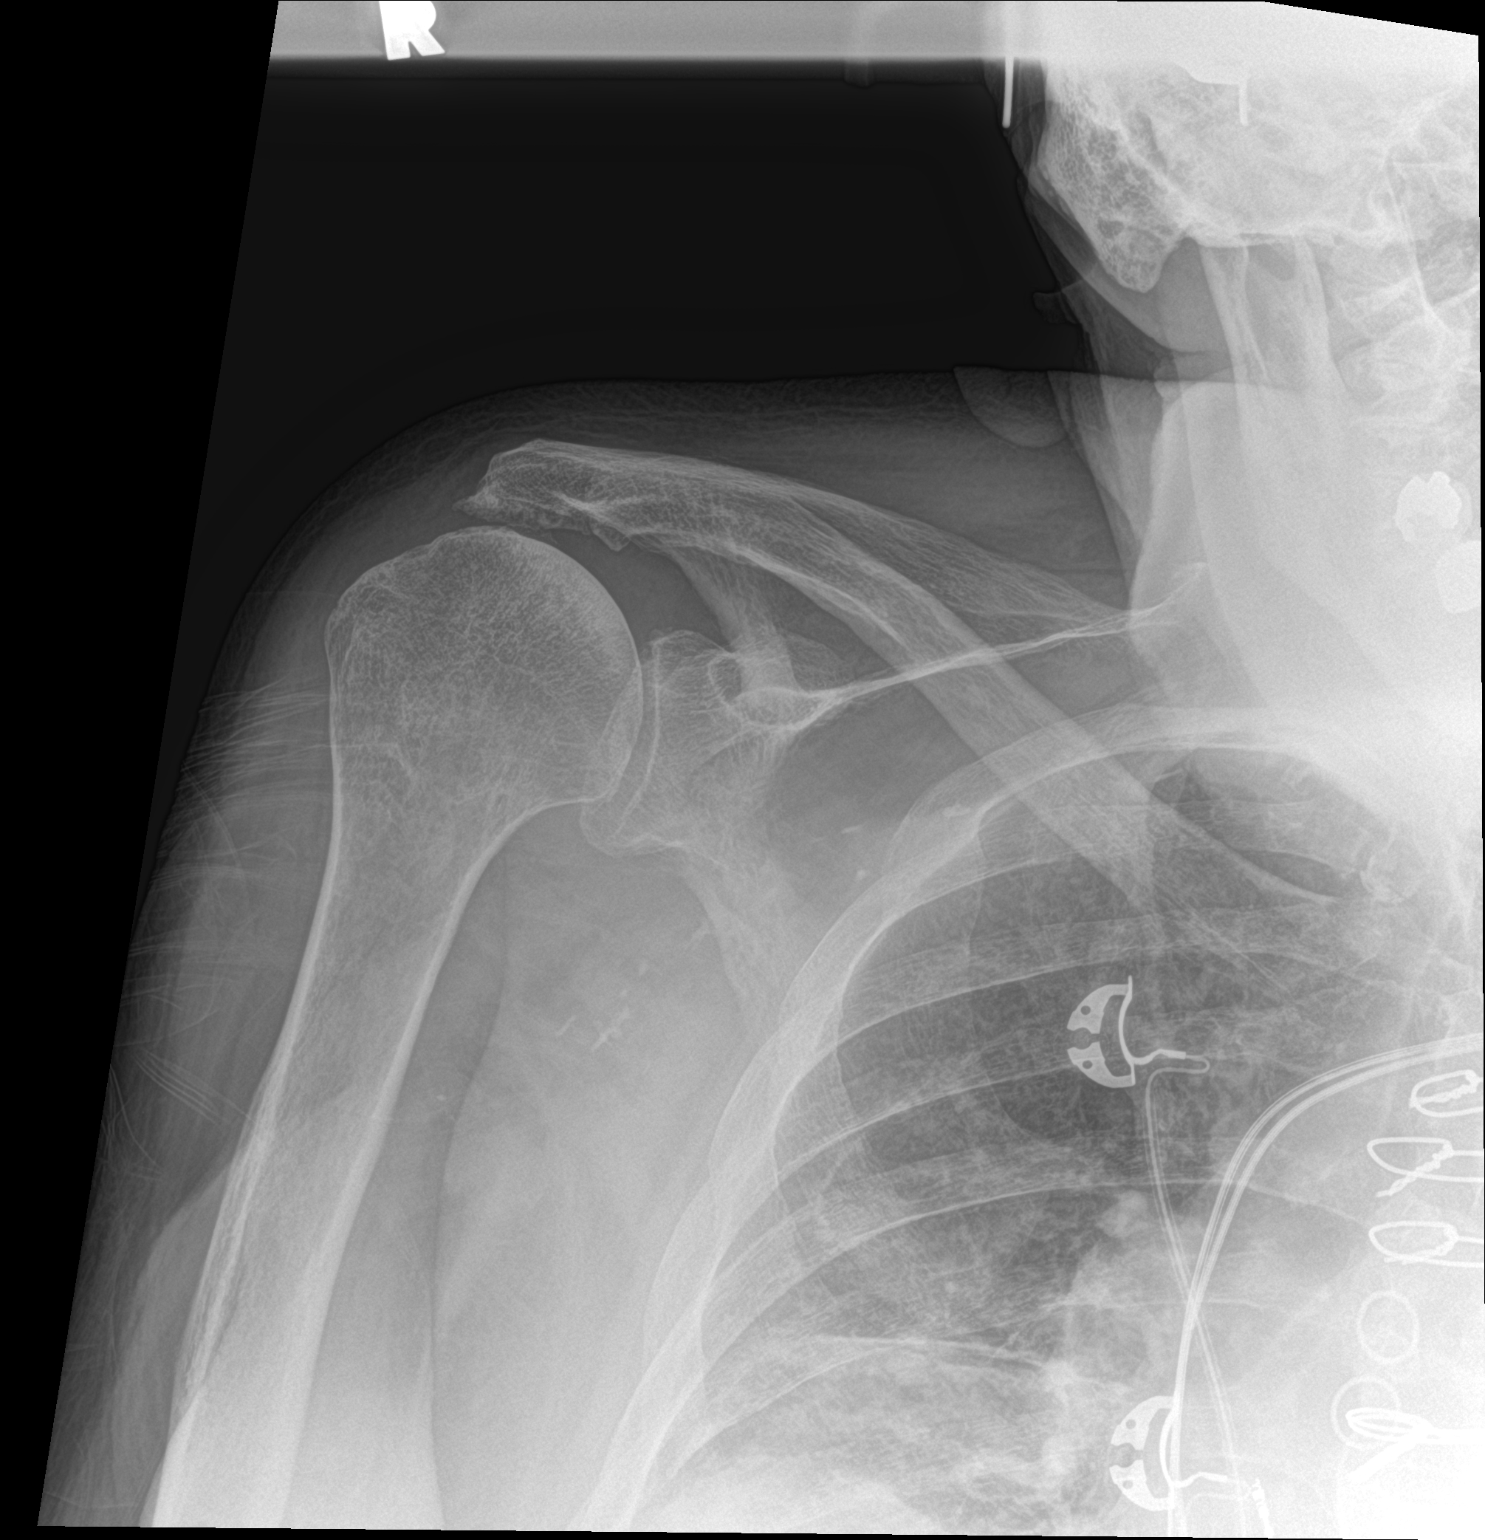

[shoulder y view]
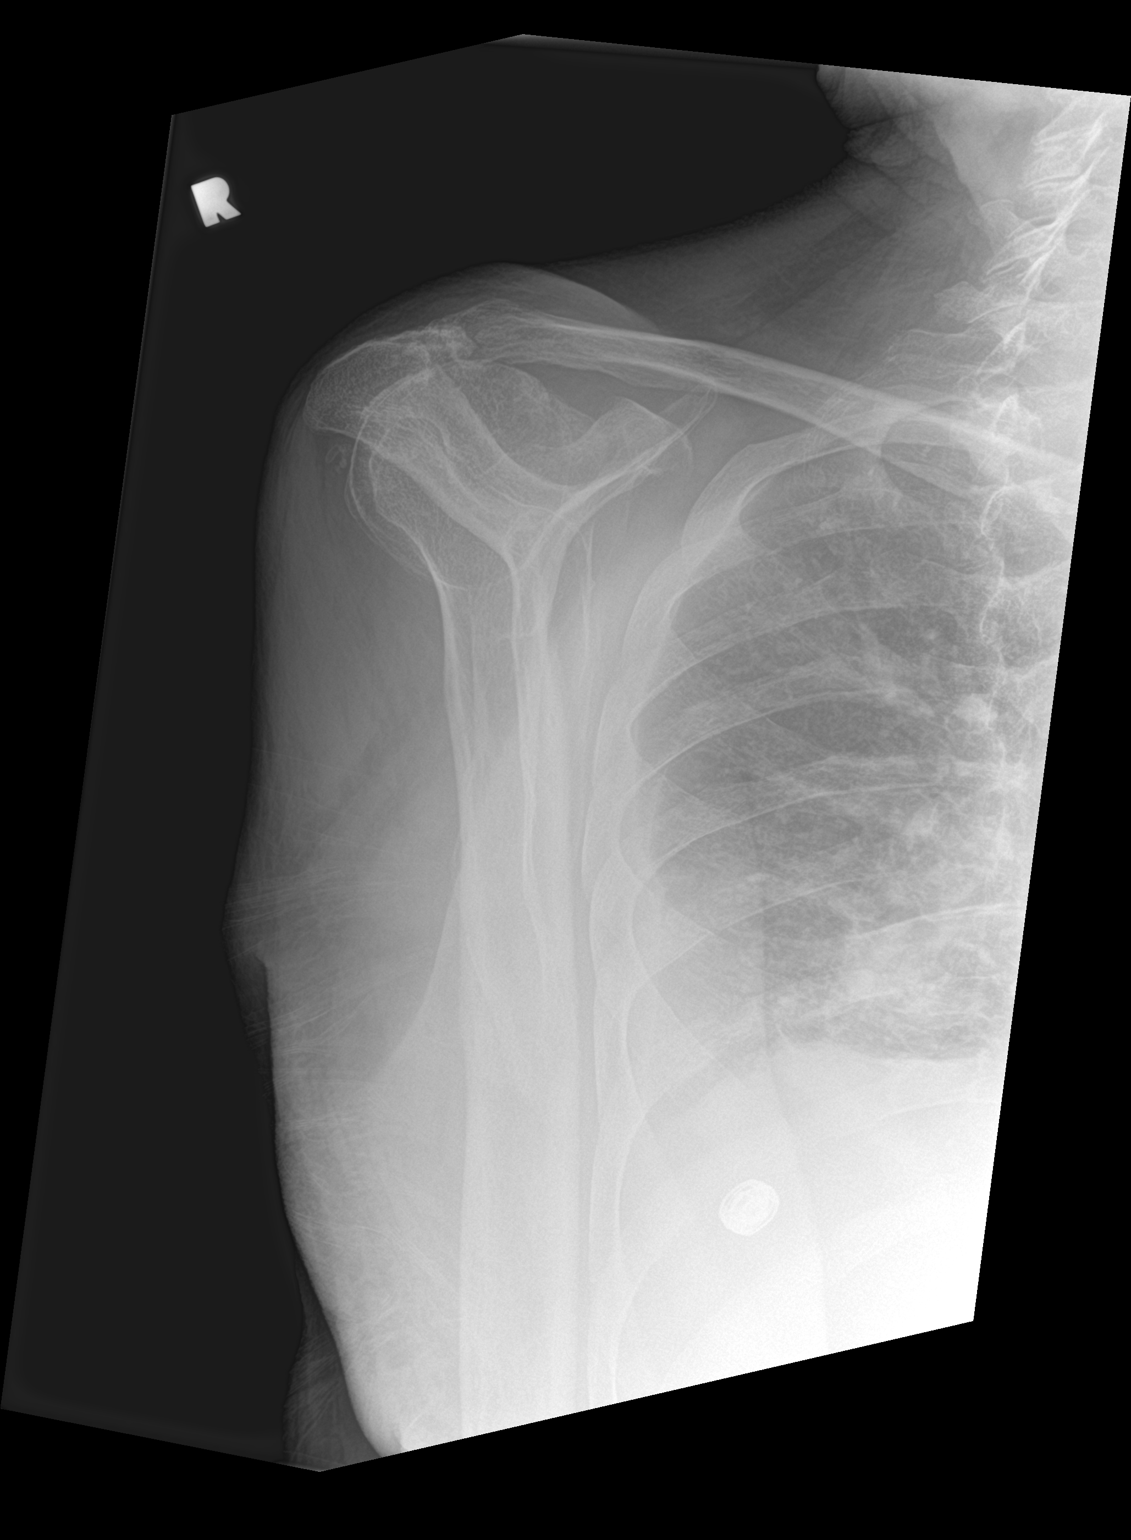

[2 of 2 positions shown; findings below may reference images not displayed]

FINDINGS: There is no evidence of fracture or dislocation. Mild
acromioclavicular osteoarthritis. Small subacromial spur. There is
superior subluxation of the humeral head. Soft tissues are
unremarkable.
IMPRESSION: 1. No fracture or dislocation of the right shoulder.
2. Mild acromioclavicular osteoarthritis. Small subacromial spur.
3. Superior subluxation of the humeral head suggests chronic rotator
cuff arthropathy.

## 2023-05-21 IMAGING — US US EXTREM  UP VENOUS*R*
1 series · 13 of 24 positions shown · non-contrast
Comparison: None.

CLINICAL DATA: Edema, shortness of breath, post fall



[Series 1: us venous img upper uni right (dvt) · portal-venous · 13 of 40 slices shown]
[im 1/40]
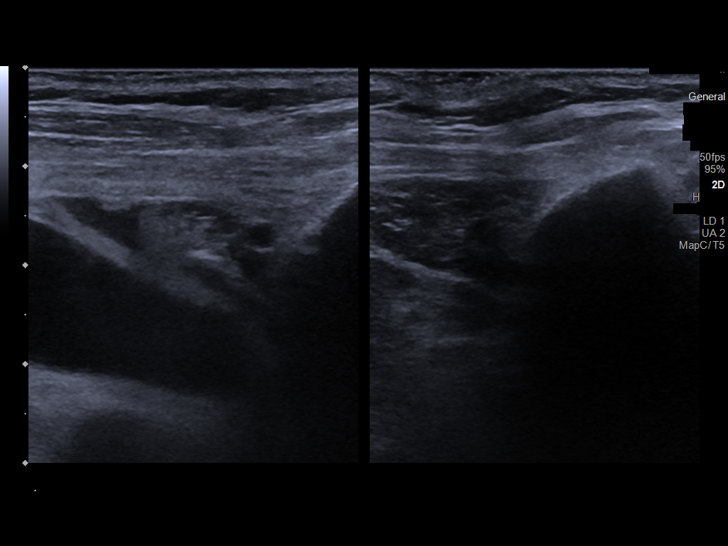
[im 4/40]
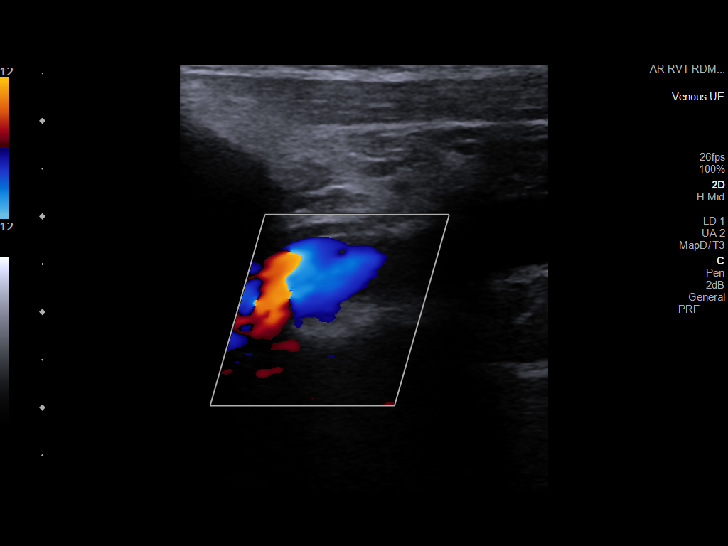
[im 7/40]
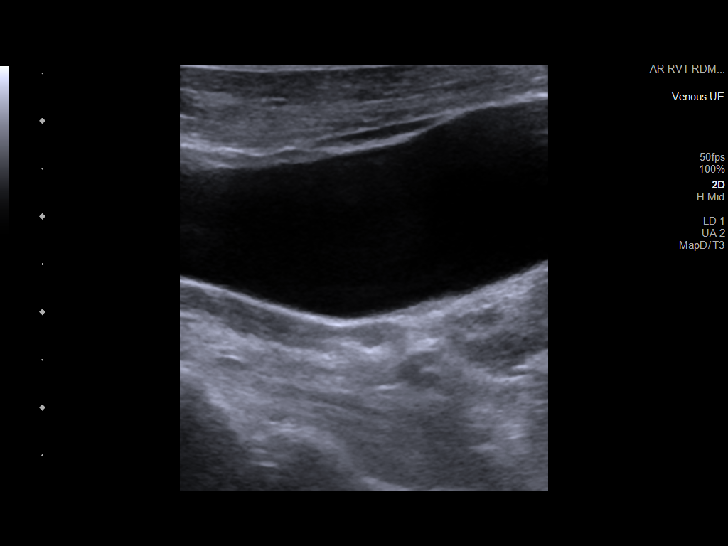
[im 11/40]
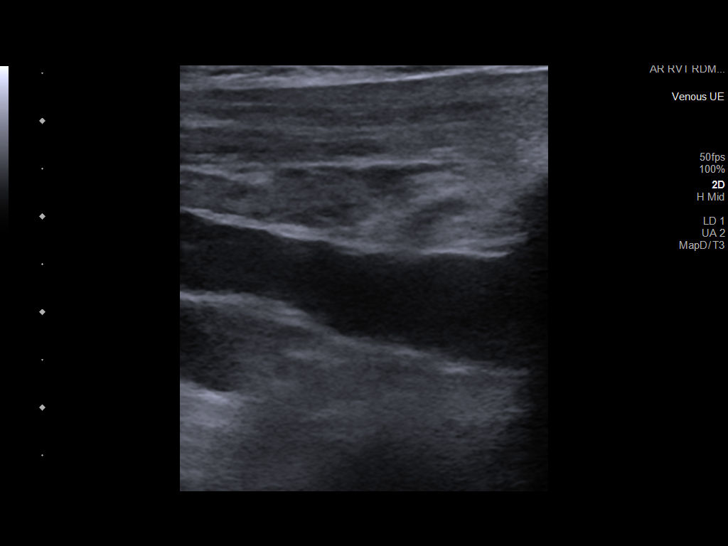
[im 14/40]
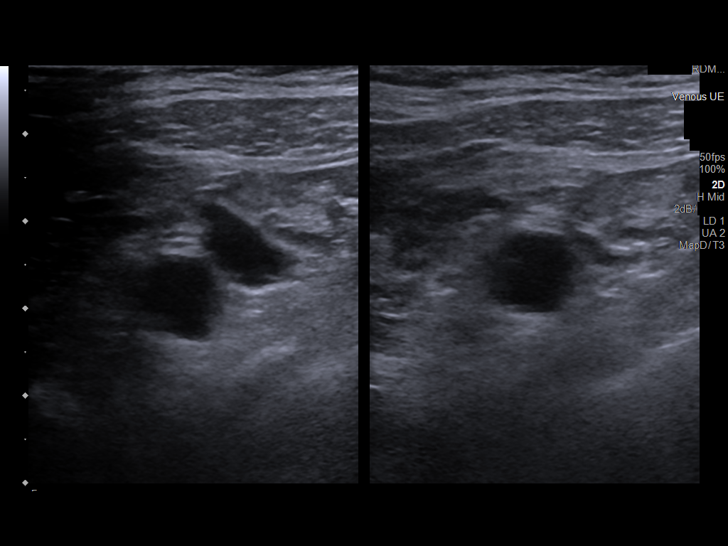
[im 17/40]
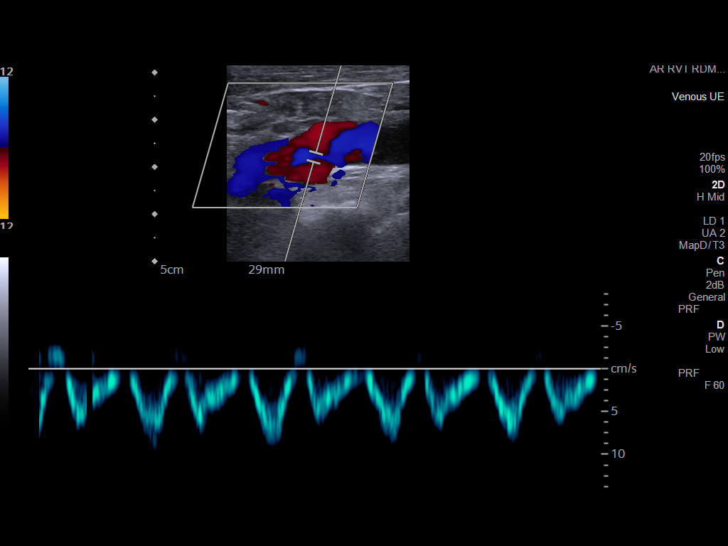
[im 21/40]
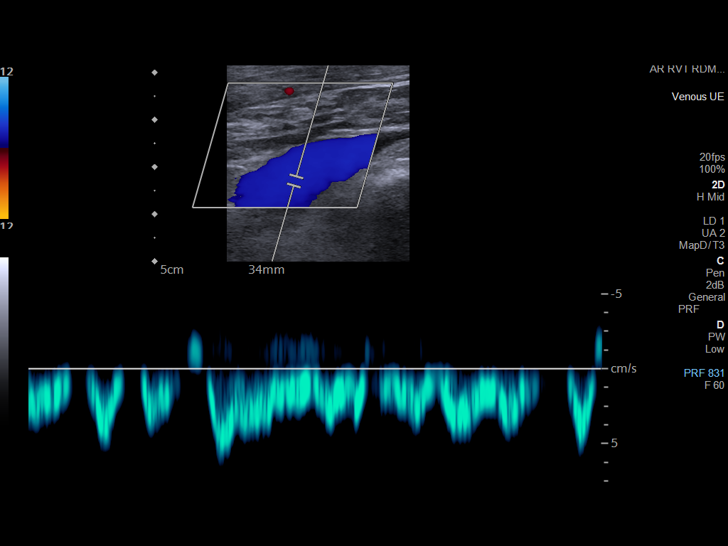
[im 23/40]
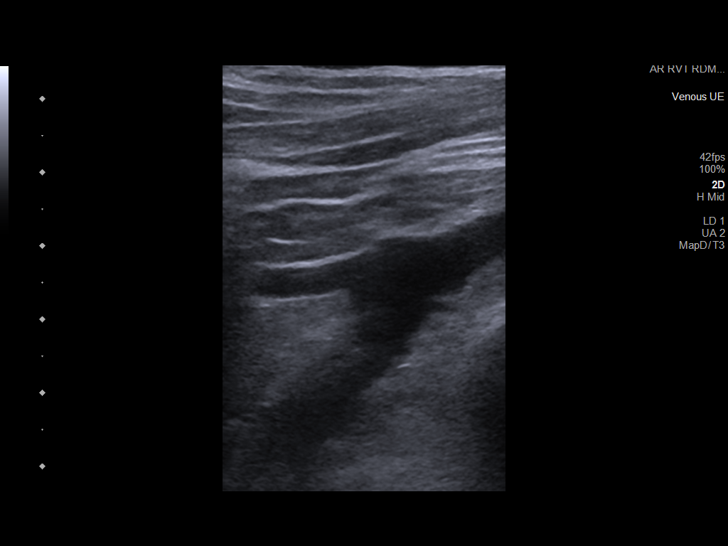
[im 26/40]
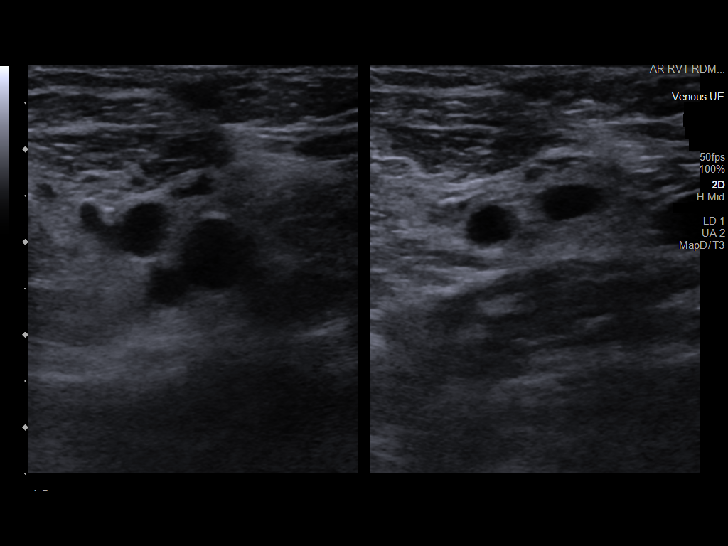
[im 29/40]
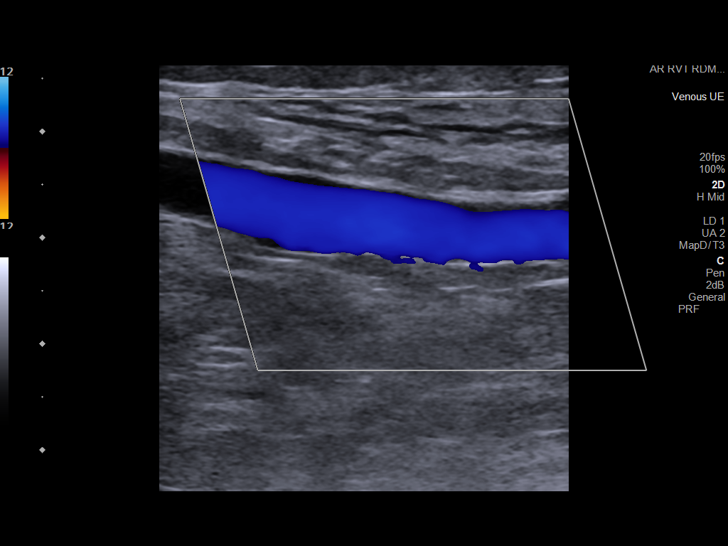
[im 33/40]
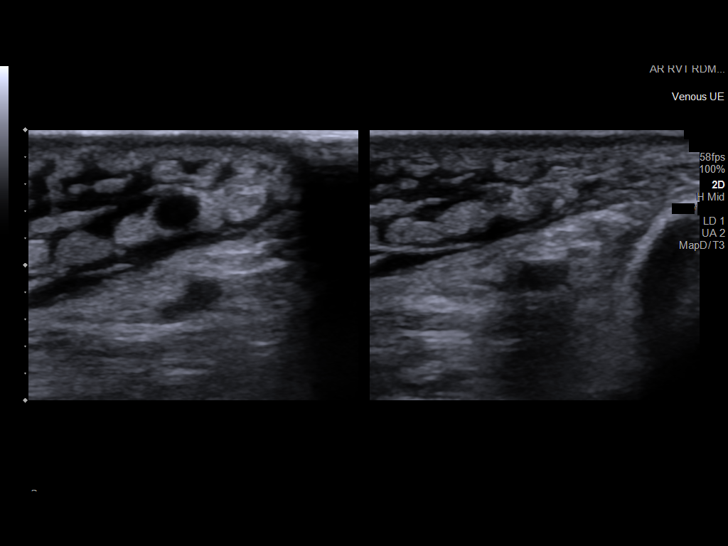
[im 36/40]
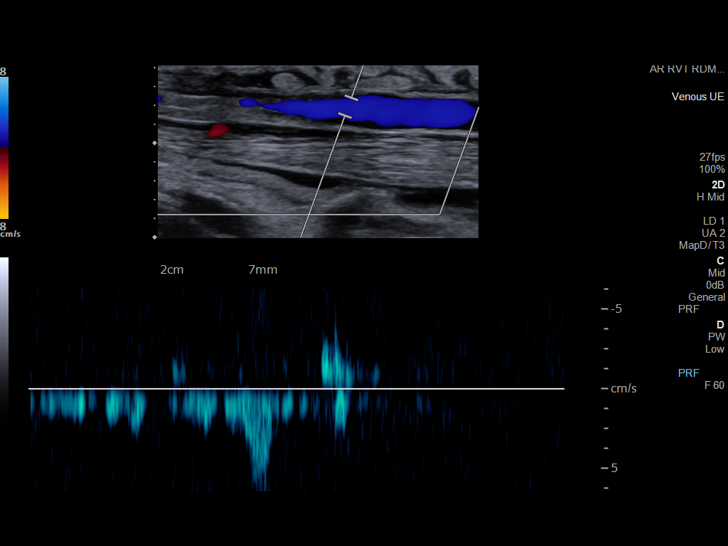
[im 40/40]
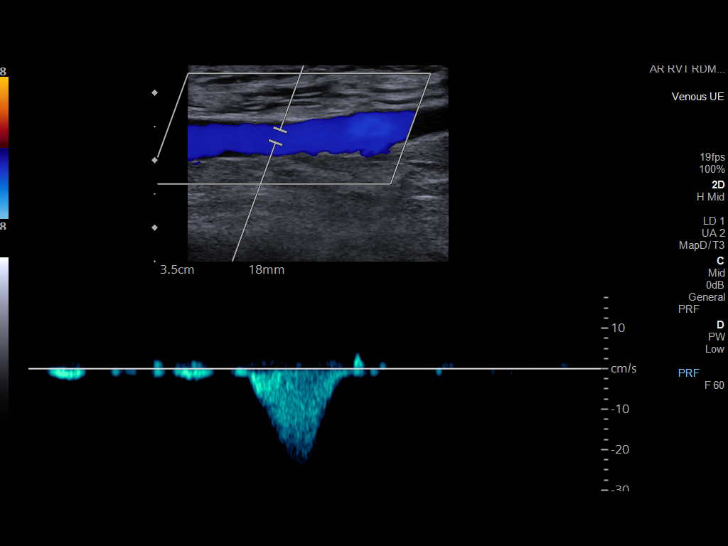

[13 of 24 positions shown; findings below may reference images not displayed]

FINDINGS: Contralateral Subclavian Vein: Respiratory phasicity is normal and
symmetric with the symptomatic side. No evidence of thrombus. Normal
compressibility.

Internal Jugular Vein: No evidence of thrombus. Normal
compressibility, respiratory phasicity and response to augmentation.

Subclavian Vein: No evidence of thrombus. Normal compressibility,
respiratory phasicity and response to augmentation.

Axillary Vein: No evidence of thrombus. Normal compressibility,
respiratory phasicity and response to augmentation.

Cephalic Vein: No evidence of thrombus. Normal compressibility,
respiratory phasicity and response to augmentation.

Basilic Vein: No evidence of thrombus. Normal compressibility,
respiratory phasicity and response to augmentation.

Brachial Veins: No evidence of thrombus. Normal compressibility,
respiratory phasicity and response to augmentation.

Radial Veins: No evidence of thrombus. Normal compressibility,
respiratory phasicity and response to augmentation.

Ulnar Veins: No evidence of thrombus. Normal compressibility,
respiratory phasicity and response to augmentation.

Venous Reflux:  None visualized.

Other Findings:  Subcutaneous edema in the wrist.
IMPRESSION: No evidence of DVT within the right upper extremity.
